# Patient Record
Sex: Female | Born: 1935 | Race: White | Hispanic: No | Smoking: Never smoker
Health system: Southern US, Community
[De-identification: ages and names within clinical notes are randomized; demographics above are authoritative.]

## PROBLEM LIST (undated history)

## (undated) ENCOUNTER — Emergency Department (HOSPITAL_COMMUNITY): Payer: No Typology Code available for payment source | Source: Home / Self Care

## (undated) DIAGNOSIS — Z853 Personal history of malignant neoplasm of breast: Secondary | ICD-10-CM

## (undated) DIAGNOSIS — E785 Hyperlipidemia, unspecified: Secondary | ICD-10-CM

## (undated) DIAGNOSIS — I10 Essential (primary) hypertension: Secondary | ICD-10-CM

## (undated) DIAGNOSIS — E039 Hypothyroidism, unspecified: Secondary | ICD-10-CM

## (undated) DIAGNOSIS — I2109 ST elevation (STEMI) myocardial infarction involving other coronary artery of anterior wall: Secondary | ICD-10-CM

## (undated) DIAGNOSIS — Z8739 Personal history of other diseases of the musculoskeletal system and connective tissue: Secondary | ICD-10-CM

## (undated) DIAGNOSIS — I2510 Atherosclerotic heart disease of native coronary artery without angina pectoris: Secondary | ICD-10-CM

## (undated) DIAGNOSIS — M1990 Unspecified osteoarthritis, unspecified site: Secondary | ICD-10-CM

## (undated) DIAGNOSIS — I639 Cerebral infarction, unspecified: Secondary | ICD-10-CM

## (undated) HISTORY — DX: Hyperlipidemia, unspecified: E78.5

## (undated) HISTORY — PX: BACK SURGERY: SHX140

## (undated) HISTORY — PX: CHOLECYSTECTOMY: SHX55

## (undated) HISTORY — DX: Personal history of malignant neoplasm of breast: Z85.3

## (undated) HISTORY — DX: Cerebral infarction, unspecified: I63.9

## (undated) HISTORY — PX: OTHER SURGICAL HISTORY: SHX169

## (undated) HISTORY — DX: Hypothyroidism, unspecified: E03.9

## (undated) HISTORY — PX: CATARACT EXTRACTION: SUR2

## (undated) HISTORY — DX: ST elevation (STEMI) myocardial infarction involving other coronary artery of anterior wall: I21.09

## (undated) HISTORY — DX: Essential (primary) hypertension: I10

## (undated) HISTORY — DX: Personal history of other diseases of the musculoskeletal system and connective tissue: Z87.39

---

## 1997-11-21 ENCOUNTER — Ambulatory Visit (HOSPITAL_COMMUNITY): Admission: RE | Admit: 1997-11-21 | Discharge: 1997-11-21 | Payer: Self-pay | Admitting: Cardiothoracic Surgery

## 1997-12-04 ENCOUNTER — Ambulatory Visit (HOSPITAL_COMMUNITY): Admission: RE | Admit: 1997-12-04 | Discharge: 1997-12-04 | Payer: Self-pay | Admitting: Cardiothoracic Surgery

## 1997-12-11 ENCOUNTER — Inpatient Hospital Stay (HOSPITAL_COMMUNITY): Admission: RE | Admit: 1997-12-11 | Discharge: 1997-12-12 | Payer: Self-pay | Admitting: Cardiothoracic Surgery

## 1998-07-02 ENCOUNTER — Encounter: Payer: Self-pay | Admitting: Internal Medicine

## 1998-07-02 ENCOUNTER — Ambulatory Visit (HOSPITAL_COMMUNITY): Admission: RE | Admit: 1998-07-02 | Discharge: 1998-07-02 | Payer: Self-pay | Admitting: Internal Medicine

## 1999-07-07 ENCOUNTER — Encounter: Payer: Self-pay | Admitting: General Surgery

## 1999-07-07 ENCOUNTER — Ambulatory Visit (HOSPITAL_COMMUNITY): Admission: RE | Admit: 1999-07-07 | Discharge: 1999-07-07 | Payer: Self-pay | Admitting: General Surgery

## 2000-08-11 ENCOUNTER — Ambulatory Visit (HOSPITAL_COMMUNITY): Admission: RE | Admit: 2000-08-11 | Discharge: 2000-08-11 | Payer: Self-pay | Admitting: Internal Medicine

## 2000-08-11 ENCOUNTER — Encounter: Payer: Self-pay | Admitting: Internal Medicine

## 2001-08-15 ENCOUNTER — Encounter: Payer: Self-pay | Admitting: Internal Medicine

## 2001-08-15 ENCOUNTER — Ambulatory Visit (HOSPITAL_COMMUNITY): Admission: RE | Admit: 2001-08-15 | Discharge: 2001-08-15 | Payer: Self-pay | Admitting: Internal Medicine

## 2002-08-15 ENCOUNTER — Encounter: Payer: Self-pay | Admitting: Internal Medicine

## 2002-08-15 ENCOUNTER — Ambulatory Visit (HOSPITAL_COMMUNITY): Admission: RE | Admit: 2002-08-15 | Discharge: 2002-08-15 | Payer: Self-pay | Admitting: Obstetrics and Gynecology

## 2002-08-22 ENCOUNTER — Encounter: Payer: Self-pay | Admitting: Emergency Medicine

## 2002-08-22 ENCOUNTER — Emergency Department (HOSPITAL_COMMUNITY): Admission: EM | Admit: 2002-08-22 | Discharge: 2002-08-22 | Payer: Self-pay | Admitting: Emergency Medicine

## 2002-08-22 ENCOUNTER — Inpatient Hospital Stay (HOSPITAL_COMMUNITY): Admission: RE | Admit: 2002-08-22 | Discharge: 2002-08-27 | Payer: Self-pay | Admitting: Cardiology

## 2003-05-02 ENCOUNTER — Emergency Department (HOSPITAL_COMMUNITY): Admission: EM | Admit: 2003-05-02 | Discharge: 2003-05-02 | Payer: Self-pay | Admitting: Emergency Medicine

## 2003-08-20 ENCOUNTER — Ambulatory Visit (HOSPITAL_COMMUNITY): Admission: RE | Admit: 2003-08-20 | Discharge: 2003-08-20 | Payer: Self-pay | Admitting: Internal Medicine

## 2004-09-08 ENCOUNTER — Ambulatory Visit (HOSPITAL_COMMUNITY): Admission: RE | Admit: 2004-09-08 | Discharge: 2004-09-08 | Payer: Self-pay | Admitting: Internal Medicine

## 2004-09-09 ENCOUNTER — Ambulatory Visit: Payer: Self-pay | Admitting: Internal Medicine

## 2005-10-20 ENCOUNTER — Ambulatory Visit (HOSPITAL_COMMUNITY): Admission: RE | Admit: 2005-10-20 | Discharge: 2005-10-20 | Payer: Self-pay | Admitting: Internal Medicine

## 2005-10-26 ENCOUNTER — Ambulatory Visit: Payer: Self-pay | Admitting: Internal Medicine

## 2006-10-25 ENCOUNTER — Ambulatory Visit (HOSPITAL_COMMUNITY): Admission: RE | Admit: 2006-10-25 | Discharge: 2006-10-25 | Payer: Self-pay | Admitting: Internal Medicine

## 2007-10-26 ENCOUNTER — Ambulatory Visit (HOSPITAL_COMMUNITY): Admission: RE | Admit: 2007-10-26 | Discharge: 2007-10-26 | Payer: Self-pay | Admitting: Internal Medicine

## 2008-01-01 ENCOUNTER — Encounter: Payer: Self-pay | Admitting: *Deleted

## 2008-01-01 DIAGNOSIS — Z9089 Acquired absence of other organs: Secondary | ICD-10-CM

## 2008-01-01 DIAGNOSIS — Z853 Personal history of malignant neoplasm of breast: Secondary | ICD-10-CM

## 2008-01-01 DIAGNOSIS — Z9189 Other specified personal risk factors, not elsewhere classified: Secondary | ICD-10-CM | POA: Insufficient documentation

## 2008-01-01 DIAGNOSIS — Z8739 Personal history of other diseases of the musculoskeletal system and connective tissue: Secondary | ICD-10-CM

## 2008-01-01 DIAGNOSIS — I10 Essential (primary) hypertension: Secondary | ICD-10-CM | POA: Insufficient documentation

## 2008-01-01 DIAGNOSIS — Z9849 Cataract extraction status, unspecified eye: Secondary | ICD-10-CM

## 2008-01-01 DIAGNOSIS — Z8719 Personal history of other diseases of the digestive system: Secondary | ICD-10-CM

## 2008-01-01 DIAGNOSIS — E039 Hypothyroidism, unspecified: Secondary | ICD-10-CM

## 2008-01-01 DIAGNOSIS — R252 Cramp and spasm: Secondary | ICD-10-CM | POA: Insufficient documentation

## 2008-01-01 DIAGNOSIS — I2109 ST elevation (STEMI) myocardial infarction involving other coronary artery of anterior wall: Secondary | ICD-10-CM | POA: Insufficient documentation

## 2008-07-02 ENCOUNTER — Telehealth (INDEPENDENT_AMBULATORY_CARE_PROVIDER_SITE_OTHER): Payer: Self-pay | Admitting: *Deleted

## 2008-09-06 ENCOUNTER — Ambulatory Visit: Payer: Self-pay | Admitting: Internal Medicine

## 2008-09-06 LAB — CONVERTED CEMR LAB
ALT: 13 units/L (ref 0–35)
Albumin: 3.8 g/dL (ref 3.5–5.2)
BUN: 11 mg/dL (ref 6–23)
Basophils Absolute: 0 10*3/uL (ref 0.0–0.1)
Basophils Relative: 0.6 % (ref 0.0–3.0)
Eosinophils Absolute: 0.1 10*3/uL (ref 0.0–0.7)
Eosinophils Relative: 2.1 % (ref 0.0–5.0)
GFR calc non Af Amer: 125 mL/min
HDL: 41.8 mg/dL (ref >39.0)
LDL Cholesterol: 107 mg/dL — ABNORMAL HIGH (ref 0–99)
MCHC: 33.6 g/dL (ref 30.0–36.0)
MCV: 82.8 fL (ref 78.0–100.0)
Monocytes Absolute: 0.5 10*3/uL (ref 0.1–1.0)
Neutro Abs: 3.2 10*3/uL (ref 1.4–7.7)
Neutrophils Relative %: 56.9 % (ref 43.0–77.0)
Platelets: 235 10*3/uL (ref 150–400)
RDW: 13.6 % (ref 11.5–14.6)
Total Bilirubin: 0.9 mg/dL (ref 0.3–1.2)
Triglycerides: 82 mg/dL (ref 0–149)
WBC: 5.4 10*3/uL (ref 4.5–10.5)

## 2008-11-13 ENCOUNTER — Ambulatory Visit (HOSPITAL_COMMUNITY): Admission: RE | Admit: 2008-11-13 | Discharge: 2008-11-13 | Payer: Self-pay | Admitting: Orthopedic Surgery

## 2009-10-14 ENCOUNTER — Telehealth: Payer: Self-pay | Admitting: Internal Medicine

## 2009-10-29 ENCOUNTER — Encounter: Payer: Self-pay | Admitting: Internal Medicine

## 2009-12-16 ENCOUNTER — Ambulatory Visit (HOSPITAL_COMMUNITY): Admission: RE | Admit: 2009-12-16 | Discharge: 2009-12-16 | Payer: Self-pay | Admitting: Internal Medicine

## 2009-12-16 LAB — HM MAMMOGRAPHY: HM Mammogram: NEGATIVE

## 2010-04-14 ENCOUNTER — Encounter: Payer: Self-pay | Admitting: Internal Medicine

## 2010-04-14 ENCOUNTER — Ambulatory Visit: Payer: Self-pay | Admitting: Internal Medicine

## 2010-04-14 LAB — CONVERTED CEMR LAB
AST: 23 units/L (ref 0–37)
BUN: 11 mg/dL (ref 6–23)
Basophils Absolute: 0 10*3/uL (ref 0.0–0.1)
Basophils Relative: 0.7 % (ref 0.0–3.0)
Bilirubin, Direct: 0.1 mg/dL (ref 0.0–0.3)
CO2: 27 meq/L (ref 19–32)
Calcium: 9.3 mg/dL (ref 8.4–10.5)
Eosinophils Relative: 1.5 % (ref 0.0–5.0)
Glucose, Bld: 85 mg/dL (ref 70–99)
HDL: 44.2 mg/dL (ref >39.00)
Hemoglobin: 10.9 g/dL — ABNORMAL LOW (ref 12.0–15.0)
LDL Cholesterol: 99 mg/dL (ref 0–99)
MCV: 82.3 fL (ref 78.0–100.0)
Magnesium: 1.8 mg/dL (ref 1.5–2.5)
Monocytes Absolute: 0.4 10*3/uL (ref 0.1–1.0)
Neutrophils Relative %: 60.9 % (ref 43.0–77.0)
Potassium: 4.3 meq/L (ref 3.5–5.1)
TSH: 0.97 microintl units/mL (ref 0.35–5.50)
Total Bilirubin: 0.8 mg/dL (ref 0.3–1.2)
Total CHOL/HDL Ratio: 4
Triglycerides: 88 mg/dL (ref 0.0–149.0)

## 2010-04-20 ENCOUNTER — Encounter: Payer: Self-pay | Admitting: Internal Medicine

## 2010-09-15 NOTE — Letter (Signed)
Summary: [facility]  [facility]   Imported By: Sherian Rein 11/10/2009 12:15:38  _____________________________________________________________________  External Attachment:    Type:   Image     Comment:   External Document

## 2010-09-15 NOTE — Letter (Signed)
[facility] [address] [place], [place]  [address] Phone: [phone]      April 21, 2010   Leah Figueroa [address] [place], [place] [address]  RE:  LAB RESULTS  Dear  Leah Figueroa,  The following is an interpretation of your most recent lab tests.  Please take note of any instructions provided or changes to medications that have resulted from your lab work.  KIDNEY FUNCTION TESTS:  Good - no changes needed  LIVER FUNCTION TESTS:  Good - no changes needed  LIPID PANEL:  Good - no changes needed Triglyceride: 88.0   Cholesterol: 161   LDL: 99   HDL: 44.20   Chol/HDL%:  4  THYROID STUDIES:  Thyroid studies normal TSH: 0.97     DIABETIC STUDIES:  Excellent - no changes needed Blood Glucose: 85    CBC:  Good - no changes needed   Normal lab resuslts.  Please come see me if you have any questions about these lab results.   Sincerely Yours,    Jacques Navy MD Hassell Halim Note: All result statuses are Final unless otherwise noted.  Tests: (1) CMP (COMP)   Sodium                    141 mEq/L                   135-145   Potassium                 4.3 mEq/L                   3.5-5.1   Chloride                  105 mEq/L                   96-112   Carbon Dioxide            28 mEq/L                    19-32   Glucose                   99 mg/dL                    13-24   BUN                       8 mg/dL                     4-01   Creatinine                0.6 mg/dL                   0.2-7.2   Total Bilirubin           1.1 mg/dL                   5.3-6.6   Alkaline Phosphatase      88 U/L                      39-117   AST                  [H]  38 U/L  0-37   ALT                       31 U/L                      0-53   Total Protein             6.1 g/dL                    1.1-9.1   Albumin                   4.0 g/dL                    4.7-8.2   Calcium                   8.9 mg/dL                   9.5-62.1   GFR                        132.12 mL/min               >60  Tests: (2) Lipid Panel (LIPID)   Cholesterol               123 mg/dL                   3-086     ATP III Classification            Desirable:  < 200 mg/dL                    Borderline High:  200 - 239 mg/dL               High:  > = 240 mg/dL   Triglycerides             97.0 mg/dL                  5.7-846.9     Normal:  <150 mg/dL     Borderline High:  629 - 199 mg/dL   HDL                       52.84 mg/dL                 >13.24   VLDL Cholesterol          19.4 mg/dL                  4.0-10.2   LDL Cholesterol           41 mg/dL                    7-25  CHO/HDL Ratio:  CHD Risk                             2                    Men          Women     1/2 Average Risk     3.4          3.3     Average Risk          5.0  4.4     2X Average Risk          9.6          7.1     3X Average Risk          15.0          11.0                           Tests: (3) CBC Platelet w/Diff (CBCD)   White Cell Count          5.7 K/uL                    4.5-10.5   Red Cell Count       [L]  4.19 Mil/uL                 4.22-5.81   Hemoglobin                14.4 g/dL                   16.1-09.6   Hematocrit                42.3 %                      39.0-52.0   MCV                  [H]  101.0 fl                    78.0-100.0   MCHC                      33.9 g/dL                   04.5-40.9   RDW                       13.3 %                      11.5-14.6   Platelet Count            155.0 K/uL                  150.0-400.0   Neutrophil %              70.0 %                      43.0-77.0   Lymphocyte %              19.5 %                      12.0-46.0   Monocyte %                7.6 %                       3.0-12.0   Eosinophils%              2.4 %                       0.0-5.0   Basophils %               0.5 %  0.0-3.0   Neutrophill Absolute      4.0 K/uL                    1.4-7.7   Lymphocyte Absolute       1.1 K/uL                     0.7-4.0   Monocyte Absolute         0.4 K/uL                    0.1-1.0  Eosinophils, Absolute                             0.1 K/uL                    0.0-0.7   Basophils Absolute        0.0 K/uL                    0.0-0.1

## 2010-09-15 NOTE — Assessment & Plan Note (Signed)
Summary: YEARLY FU/ MEDICARE/ LABS LATER/RS'D FROM 8-29 BUMP/ NWS   Vital Signs:  Patient profile:   77 year old female Height:      63 inches Weight:      124 pounds BMI:     22.05 O2 Sat:      97 % on Room air Temp:     97.9 degrees F oral Pulse rate:   54 / minute BP sitting:   158 / 82  (left arm) Cuff size:   regular  Vitals Entered By: Bill Salinas CMA (April 14, 2010 9:11 AM)  O2 Flow:  Room air CC: pt here for medicare physical/ ab   Primary Care Provider:  Jacques Navy MD  CC:  pt here for medicare physical/ ab.  History of Present Illness: Patient presents for general medical follow-up.She reports that she will be sleepy during the day but she has sleep latency insomnia. She will awaken every 2 hours to micturate. She also has nocturnal leg cramps and restless legs at night. She reports that mustard and milk will help reduce the leg cramps. Lastly she c/o pruritis of the head and neck.  Interval - she did have a cataract extraction with IOL May 2011.   She lives alone, but her daughter is moving in. She handles all her ADLs -cooking, cleaning, laundry. She pays her own bills and balances her check book. 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 She drives. She has had no falls and has no particular fall risk.   Preventive Screening-Counseling & Management  Alcohol-Tobacco     Alcohol drinks/day: 0     Smoking Status: never  Caffeine-Diet-Exercise     Caffeine use/day: none     Does Patient Exercise: No   Hep-HIV-STD-Contraception     Dental Visit-last 6 months yes     Sun Exposure-Excessive: no  Safety-Violence-Falls     Seat Belt Use: yes     Firearms in the Home: no firearms in the home     Smoke Detectors: yes     Violence in the Home: no risk noted     Sexual Abuse: no     Fall Risk: low fall risk      Drug Use:  never.    Current Medications (verified): 1)  Synthroid 25 Mcg  Tabs (Levothyroxine Sodium) .... Take Once Daily 2)  Toprol Xl  100 Mg  Tb24 (Metoprolol Succinate) .... Take Once Daily 3)  Lovastatin 20 Mg  Tabs (Lovastatin) .... Take Once Daily 4)  Aspirin 81 Mg  Tabs (Aspirin) .... Take Once Daily  Allergies (verified): No Known Drug Allergies  Past History:  Past Surgical History: * Hx of RIGHT THYROID LOBECTOMY * DSK SURGERY CATARACT EXTRACTION, HX OF (ICD-V45.61)-left 2006, right 2011 * INTRATHORACIC GOITER RESECTION CHOLECYSTECTOMY, LAPAROSCOPIC, HX OF (ICD-V45.79) * LUMPECTOMY FOR BREAST CANCER-left '80s  Family History: Reviewed history from 09/06/2008 and no changes required. 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 I-ADLs, manages books, pays bills. End-of-Life Care: no heroic measures; No CPR, no mechanical ventilationCaffeine use/day:  none Does Patient Exercise:  No  Dental Care w/in 6 mos.:  yes Sun Exposure-Excessive:  no Seat Belt Use:  yes Fall Risk:  low fall risk Drug Use:  never  Review of Systems       The patient complains of vision loss.  The patient denies anorexia,  fever, weight loss, weight gain, decreased hearing, chest pain, syncope, dyspnea on exertion, headaches, hemoptysis, abdominal pain, severe indigestion/heartburn, incontinence, muscle weakness, difficulty walking, depression, abnormal bleeding, angioedema, and breast masses.         vision loss right eye. She does have presbyopia and did have myopia corrected.   Physical Exam  General:  WNWD elderly white female in no distress Head:  normocephalic and atraumatic.   Eyes:  pu;pils look normal s/p cataract extraction and IOL left. She has decreased vision right eye. C&S clear Ears:  hearing aids in place Nose:  no external deformity.   Mouth:  upper denture,m lower partial. No oral lesions. Posterior pharynx clear Neck:  supple and full ROM.  Well healed anterior  surgical scar. No palpable enlargement of the thyroid Chest Wall:  No deformities, masses, or tenderness noted. Breasts:  loss of tissue right and left. Large cystic structure 0900-1100 position left breast. No nipple discharge, skin normal. Right breast normal. Lungs:  normal respiratory effort, normal breath sounds, no crackles, and no wheezes.   Heart:  normal rate, regular rhythm, no murmur, and no JVD.   Abdomen:  soft, non-tender, normal bowel sounds, no distention, and no hepatomegaly.   Msk:  changes at PIP and DIP joints both hands, mild interosseous wasting both hands.  Pulses:  2+ radial and DP pulses Extremities:  No clubbing, cyanosis, edema, or deformity noted with normal full range of motion of all joints.   Neurologic:  alert & oriented X3, cranial nerves II-XII intact, strength normal in all extremities, sensation intact to light touch, gait normal, and DTRs symmetrical and normal.   Skin:  pin-point tatoos on left breast and chest wall. No other lesions noted.  Cervical Nodes:  no anterior cervical adenopathy and no posterior cervical adenopathy.   Axillary Nodes:  left axilla with radiation changes. Right axilla normal Psych:  Oriented X3, memory intact for recent and remote, normally interactive, and good eye contact.     Impression & Recommendations:  Problem # 1:  ARTHRITIS, HX OF (ICD-V13.4) Generally stable with no significant limitations in her activities.  Plan- continue with as needed NSAIDs  Orders: TLB-CBC Platelet - w/Differential (85025-CBCD)  Problem # 2:  CATARACT EXTRACTION, HX OF (ICD-V45.61) Doing well after cataract surgery. Follow-up per opthalmology  Problem # 3:  HYPERTENSION (ICD-401.9)  Her updated medication list for this problem includes:    Toprol Xl 100 Mg Tb24 (Metoprolol succinate) .Marland Kitchen... Take once daily  BP today: 158/82 Prior BP: 148/64 (09/06/2008)  Suboptimal control. In addition she may have some sense of somnolence from  beta-blocker dose.  Plan - will discuss change in medication at next office visit. Will consider CCB  Problem # 4:  HYPOTHYROIDISM (ICD-244.9) Due for lab follow-up with recommendations to follow.  Her updated medication list for this problem includes:    Synthroid 25 Mcg Tabs (Levothyroxine sodium) .Marland Kitchen... Take once daily  Orders: TLB-T4 (Thyrox), Free [phone]) TLB-TSH (Thyroid Stimulating Hormone) (84443-TSH)  Addendum- lab reveals good control and normal hormone levels  Plan - continue present medications  Problem # 5:  HYPERLIPIDEMIA (ICD-272.4) Due for lab with recommendations to follow  Her updated medication list for this problem includes:    Lovastatin 20 Mg Tabs (Lovastatin) .Marland Kitchen... Take once daily  Orders: TLB-Lipid Panel (80061-LIPID) TLB-Hepatic/Liver Function Pnl (80076-HEPATIC)  Addendum- good control with LDL = 99, at goal  Plan - continue present medications.  Problem # 6:  Hx of LEG CRAMPS (ICD-729.82) Patient gets  relief with mstard! Will check Mg., K   Orders: TLB-BMP (Basic Metabolic Panel-BMET) (80048-METABOL) TLB-Magnesium (Mg) (83735-MG)  Addendum - Mg normal at 1.4, Potassium normal.  Plan - continue mustard. Can try tonic water.  Problem # 7:  Preventive Health Care (ICD-V70.0) Unremarkable history. Normal physical exam. Lab results are in normal range. She had a recent mammogram - negatve. She is current with colorectal cancer screening and has aged out of further exams.  Patient is very active and shows no signs of depression. She is 100% independent in ADLs, no fall or injury risk, cognitively intact. End of life wishes discussed.  In summary - a delightful woman who is in good condition for the condition she is in. She is given flu shot today. She will return as needed or 1 year.   Complete Medication List: 1)  Synthroid 25 Mcg Tabs (Levothyroxine sodium) .... Take once daily 2)  Toprol Xl 100 Mg Tb24 (Metoprolol succinate) .... Take  once daily 3)  Lovastatin 20 Mg Tabs (Lovastatin) .... Take once daily 4)  Aspirin 81 Mg Tabs (Aspirin) .... Take once daily 5)  Axona Pack (Dietary management product) .... 40mg  packet daily  Other Orders: Flu Vaccine 76yrs + (16109) Administration Flu vaccine - MCR (G0008) [facility] -Subsequent Annual Wellness Visit [phone])      Flu Vaccine Consent Questions     Do you have a history of severe allergic reactions to this vaccine? no    Any prior history of allergic reactions to egg and/or gelatin? no    Do you have a sensitivity to the preservative Thimersol? no    Do you have a past history of Guillan-Barre Syndrome? no    Do you currently have an acute febrile illness? no    Have you ever had a severe reaction to latex? no    Vaccine information given and explained to patient? yes    Are you currently pregnant? no    Lot Number:[id number]   Exp Date:02/13/2011   Site Given  RIGHT Deltoid IMflu ......... Lamar Sprinkles, CMA  April 14, 2010 10:29 AM

## 2010-09-15 NOTE — Progress Notes (Signed)
  Phone Note Refill Request   Refills Requested: Medication #1:  SYNTHROID 25 MCG  TABS Take once daily  Medication #2:  TOPROL XL 100 MG  TB24 Take once daily  Medication #3:  LOVASTATIN 20 MG  TABS Take once daily Initial call taken by: Ami Bullins CMA,  October 14, 2009 9:13 AM    Prescriptions: LOVASTATIN 20 MG  TABS (LOVASTATIN) Take once daily  #90 x 3   Entered by:   Ami Bullins CMA   Authorized by:   Jacques Navy MD   Signed by:   Bill Salinas CMA on 10/14/2009   Method used:   Faxed to ...       [facility] ([facility]order)             , [place]         Ph:        Fax: [phone]   RxID:   [id number] TOPROL XL 100 MG  TB24 (METOPROLOL SUCCINATE) Take once daily  #90 x 3   Entered by:   Ami Bullins CMA   Authorized by:   Jacques Navy MD   Signed by:   Bill Salinas CMA on 10/14/2009   Method used:   Faxed to ...       [facility])             , [place]         Ph:        Fax: [phone]   RxID:   [id number] SYNTHROID 25 MCG  TABS (LEVOTHYROXINE SODIUM) Take once daily  #90 x 3   Entered by:   Ami Bullins CMA   Authorized by:   Jacques Navy MD   Signed by:   Bill Salinas CMA on 10/14/2009   Method used:   Faxed to ...       [facility]             , [place]         Ph:        Fax: [phone]   RxID:   [id number]

## 2010-12-03 ENCOUNTER — Telehealth: Payer: Self-pay | Admitting: *Deleted

## 2010-12-03 MED ORDER — LEVOTHYROXINE SODIUM 25 MCG PO TABS: 25.0000 ug | ORAL_TABLET | Freq: Every day | ORAL | Status: DC

## 2010-12-03 MED ORDER — LOVASTATIN 20 MG PO TABS: 20.0000 mg | ORAL_TABLET | Freq: Every day | ORAL | Status: DC

## 2010-12-03 NOTE — Telephone Encounter (Signed)
Refill

## 2010-12-15 ENCOUNTER — Other Ambulatory Visit: Payer: Self-pay | Admitting: Internal Medicine

## 2010-12-15 DIAGNOSIS — Z1231 Encounter for screening mammogram for malignant neoplasm of breast: Secondary | ICD-10-CM

## 2010-12-24 ENCOUNTER — Ambulatory Visit (HOSPITAL_COMMUNITY)
Admission: RE | Admit: 2010-12-24 | Discharge: 2010-12-24 | Disposition: A | Discharge status: Home / Self Care | Payer: Medicare Other | Source: Ambulatory Visit | Attending: Internal Medicine | Admitting: Internal Medicine

## 2010-12-24 DIAGNOSIS — Z1231 Encounter for screening mammogram for malignant neoplasm of breast: Secondary | ICD-10-CM

## 2010-12-24 DIAGNOSIS — Z853 Personal history of malignant neoplasm of breast: Secondary | ICD-10-CM

## 2010-12-29 ENCOUNTER — Other Ambulatory Visit: Payer: Self-pay | Admitting: *Deleted

## 2010-12-29 MED ORDER — LOVASTATIN 20 MG PO TABS: 20.0000 mg | ORAL_TABLET | Freq: Every day | ORAL | Status: DC

## 2010-12-29 MED ORDER — LEVOTHYROXINE SODIUM 25 MCG PO TABS: 25.0000 ug | ORAL_TABLET | Freq: Every day | ORAL | Status: DC

## 2010-12-29 MED ORDER — METOPROLOL SUCCINATE ER 100 MG PO TB24: 100.0000 mg | ORAL_TABLET | Freq: Every day | ORAL | Status: DC

## 2010-12-29 NOTE — Progress Notes (Signed)
Needs Rf's to go to mail order, Done, pt aware

## 2010-12-31 ENCOUNTER — Encounter: Payer: Self-pay | Admitting: Internal Medicine

## 2011-01-01 NOTE — Cardiovascular Report (Signed)
NAME:  Leah Figueroa, Leah Figueroa                            ACCOUNT NO.:  [id number]   MEDICAL RECORD NO.:  [id number]                   PATIENT TYPE:  OIB   LOCATION:  6526                                 FACILITY:  [facility]   PHYSICIAN:  Charlies Constable, M.D. [facility]              DATE OF BIRTH:  Jan 07, 1923   DATE OF PROCEDURE:  08/22/2002  DATE OF DISCHARGE:                              CARDIAC CATHETERIZATION   PROCEDURE PERFORMED:  Cardiac catheterization.   CLINICAL HISTORY:  The patient is 77 years old and has no prior history of  known heart disease.  She came to the emergency room this morning with  prolonged chest pain and a normal ECG, but her enzymes returned positive for  a non-ST elevation myocardial infarction.  She was seen by Delton See  and Simona Huh, and scheduled urgently for catheterization.  Her past  history is significant for elevated cholesterol and positive family history  for coronary disease.   DESCRIPTION OF PROCEDURE:  The procedure was performed via the right femoral  artery using an arterial sheath and 6 French preformed coronary catheters.  A front wall arterial puncture was performed and Omnipaque contrast was  used. A distal aortogram was performed to rule out abdominal aortic  aneurysm.  The patient tolerated the procedure well and left the laboratory  in satisfactory condition.   RESULTS:  The aortic pressure was 148/74 with a mean of 107.  Left  ventricular pressure is 148/13.   The left main coronary artery:  The left main coronary artery was free of  significant disease.   Left anterior descending:  The left anterior descending artery gave rise to  two diagonal branches and four septal perforators and then was completely  occluded distally.  A distal portion of the vessel filled via septal to  septal collaterals.  There was also 40% narrowing in the mid vessel and  calcification in the proximal and mid vessel.   Circumflex artery:  The circumflex  artery gave rise to a large marginal  branch and a small AV branch.  There was 30% narrowing in one of the sub-  branches to the marginal branch.   Right coronary artery:  The right coronary is a large dominant vessel that  gave rise to a conus branch, right ventricular branch, posterior descending  branch, and three posterolateral branches.  There was irregularity in the  mid vessel.   LEFT VENTRICULOGRAPHY:  The left ventriculogram was performed in the RAO  projection  showed akinesis of the apex.  The anterolateral wall moved well  and the inferior wall moved well.  The estimated ejection fraction was 50%.   DISTAL AORTOGRAM:  A distal aortogram was performed which showed patent  renal arteries and no significant aortoiliac obstruction.   CONCLUSIONS:  Coronary artery disease, status post recent non ST segment  elevation, myocardial infarction with 40% narrowing in the mid  left anterior  descending, and total occlusion of the distal left anterior descending, 30%  narrowing in the marginal branch of the circumflex artery and irregularities  in the right coronary artery with a small area of apical wall akinesis.   RECOMMENDATIONS:  In view of these findings, I suspect the culprit lesion is  the distal LAD, although it has some features of chronic occlusion.  The  lesion does not appear very favorable for intervention and the distal vessel  appears fairly small, so we will plan medical therapy. We will plan to treat  her with Plavix, aspirin and beta blockers.                                                     Charlies Constable, M.D. [facility]    BB/MEDQ  D:  08/22/2002  T:  08/23/2002  Job:  [id number]   cc:   Rosalyn Gess. Norins, M.D. [facility]   Jonelle Sidle, M.D. [facility]  [address]  [place]  [place] [address]  Fax: 1   Cardiopulmonary Laboratory

## 2011-01-01 NOTE — Discharge Summary (Signed)
NAME:  Leah Figueroa, Leah Figueroa NO.:  [id number]   MEDICAL RECORD NO.:  [id number]                    PATIENT TYPE:   LOCATION:                                       FACILITY:   PHYSICIAN:  Charlies Constable, M.D. [facility]              DATE OF BIRTH:  1922/09/23   DATE OF ADMISSION:  08/22/2002  DATE OF DISCHARGE:  08/27/2002                           DISCHARGE SUMMARY - REFERRING   BRIEF HISTORY:  This is a pleasant 77 year-old female with a history of  hypothyroidism and hyperlipidemia who presented to the emergency room for  evaluation of chest pain which occurred on the morning of admission while  she was making coffee.  She was seen by Dr. Milinda Cave and admitted for further  evaluation.  The patient subsequently ruled in for a MI.   PAST MEDICAL HISTORY:  1. Dyslipidemia.  2. Hypothyroidism.  3. Status post cholecystectomy.  4. Remote thyroid surgery.  5. Status post back surgery.  6. History of breast carcinoma with previous lumpectomy.   ALLERGIES:  No known drug allergies.   MEDICATIONS PRIOR TO ADMISSION:  1. Synthroid 0.025 mg daily.  2. Lescol XL 80 mg at bedtime.   SOCIAL HISTORY:  The patient is married and she lives with her husband in  [place].  She has one daughter.  She does not use alcohol or tobacco.   FAMILY HISTORY:  The patient's brother died in his fifties.  He had a  history of coronary artery bypass grafting surgery.   HOSPITAL COURSE:  As noted, this patient was admitted to [facility]  for evaluation of chest pain.  She subsequently ruled in for a non-ST  elevation MI with positive cardiac enzymes.  She was treated with IV heparin  and Integrelin.   On August 22, 2002 the patient underwent cardiac catheterization performed  by Dr. Charlies Constable.  She was found to have a 40% mid LAD, a total occlusion  of the distal LAD, the circumflex had a 30% obtuse marginal lesion, the RCA  had some irregularities, the left ventricle  showed apical akinesis and the  ejection fraction was 50%.   Arrangements were being made for discharge the following day; however, the  patient had episodes of paroxysmal atrial fibrillation.  She was also noted  to be hypokalemic.  Her potassium was supplemented, her medications were  adjusted and arrangements were eventually made to discharge her on August 27, 2002.  The patient also had at least one episode of four beats of non-  sustained ventricular tachycardia on her stay.   LABORATORY DATA:  Laboratory data on the day of discharge:  BUN 14,  creatinine 0.8, potassium 4.3, glucose 95.   An EKG on the day of discharge showed normal sinus rhythm, rate 61 beats per  minute, no definite ischemia but poor R wave progression.   A CBC on  January 8 revealed a hemoglobin of 10.3 and a hematocrit of 30.0.  On January 9 her hemoglobin was 11.9, hematocrit was 34.4, WBC 9,300 and  platelets were 252,000.  Cardiac enzymes peaked on August 23, 2002 with a CK  of 566, an MB of 81.3 and an index of 14.4.  A lipid profile revealed a  cholesterol of 173, triglycerides 113, HDL 40, LDL 110.   Chest x-ray showed no significant change from a previous film that showed  some scarring at the base and a right paratracheal density that was chronic.  It is noted the patient has a history of thyroid surgery.   DISCHARGE MEDICATIONS:  1. Toprol XL 50 mg one and one half tablets daily.  2. Enteric coated aspirin 325 mg daily.  3. Plavix 75 mg daily for one year.  4. Nitroglycerin p.r.n. for chest pain.  5. She was started on Mevacor 20 mg each evening.  As noted, she had     previously been on Lescol XL 80 mg at bedtime.  This was inadvertently     not continued during her hospital stay and she was placed on the Mevacor     prior to discharge.  She could probably resume her Lescol.  6. Her Synthroid was also not continued during her hospital stay.  The     patient could resume this following  discharge.  7. Tylenol as needed for pain.   ACTIVITY:  The patient was told to avoid any strenuous activity or driving  for at least two days.  She is not to lift more than ten pounds for one  week.   DIET:  She is to be on a low salt, low fat diet.   SPECIAL INSTRUCTIONS:  She was told to call the office if she had any  increased pain, swelling or bleeding from the groin.   FOLLOW UP:  1. She is to follow-up with Dr. Charlies Constable on January 22 at 9:45 a.m.  2. Follow-up with Dr. Debby Bud as needed or as scheduled.   PROBLEM LIST AT THE TIME OF DISCHARGE:  1. Non-Q myocardial infarction.  2. Cardiac catheterization; please see results as noted above.  No     intervention was performed.  3. History of dyslipidemia.  4. History of hypothyroidism.  5. Mild hypotension during her stay.  6. Paroxysmal atrial fibrillation with no plans for Coumadin therapy at this     time.  7. Non-sustained ventricular tachycardia.  8. Mild hypokalemia.  9. Mild anemia.  10.      Ejection fraction 50% at the time of catheterization.    ADDENDUM:  The patient will be seen in the office on January 22.  It should  be confirmed that the patient has restarted her Synthroid.  Also, she could  be changed from the Mevacor to the Lescol which she had previously taken  prior to admission.     Delton See, P.A. [facility]                  Charlies Constable, M.D. [facility]    DR/MEDQ  D:  08/27/2002  T:  08/27/2002  Job:  [id number]   cc:   Rosalyn Gess. Norins, M.D. [facility]

## 2011-01-01 NOTE — H&P (Signed)
NAME:  Leah Figueroa, Leah Figueroa                            ACCOUNT NO.:  [id number]   MEDICAL RECORD NO.:  [id number]                   PATIENT TYPE:  EMS   LOCATION:  ED                                   FACILITY:  [facility]   PHYSICIAN:  Jonelle Sidle, M.D. [facility]        DATE OF BIRTH:  [birth year]   DATE OF ADMISSION:  08/22/2002  DATE OF DISCHARGE:                                HISTORY & PHYSICAL   PRIMARY CARE PHYSICIAN:  Rosalyn Gess. Norins, M.D. [facility]   CHIEF COMPLAINT:  Chest discomfort.   HISTORY OF PRESENT ILLNESS:  The patient is a very pleasant 77 year old  woman with a history of hypothyroidism and dyslipidemia who presents  following the sudden onset of chest tightness this morning around 8 a.m.  when she was making coffee.  This persisted and was associated with  diaphoresis and nausea throughout the morning until 11 a.m. when she  ultimately presented to the emergency department and received some  sublingual nitroglycerin. She had an episode of emesis and then pain relief.   At present she has some minor residual discomfort that continues to improve.  She has had no previous episodes of chest discomfort and has no known  history of coronary artery disease or myocardial infarction. 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 She denies any exertional chest discomfort, orthopnea, PND,  palpitations or syncope. She has felt tired moreso than normal over the  last few months.   ALLERGIES:  No known drug allergies or contrast dye allergy.   CURRENT MEDICATIONS:  1. Synthroid 0.025 mg p.o. q.d.  2. Lescol XL 80 mg p.o. q.h.s.   PAST MEDICAL HISTORY:  1. Dyslipidemia.  2. Hypothyroidism.  3. Status post cholecystectomy.  4. Status post remote thyroid surgery.  5. Status post back surgery.  6. History of breast cancer, status post lumpectomy.   SOCIAL HISTORY:  The patient is married and lives with her husband in  [place].  She has one daughter. She has no tobacco or alcohol use history.   FAMILY HISTORY:  Contributory for premature coronary artery disease. The  patient's brother died in his 65s with a history of coronary artery bypass  grafting.   REVIEW OF SYSTEMS:  As  described in the history of present illness. The  patient has cataracts. She has some arthralgias and joint swelling at times.   PHYSICAL EXAMINATION:  VITAL SIGNS:  Blood pressure 146/57, heart rate 62  and regular.  GENERAL:  This is a well nourished elderly woman lying supine in no acute  distress.  HEENT:  Conjunctivae and lids are normal. Oropharynx is clear.  NECK:  Supple without elevated jugular venous pressure or carotid bruits. No  thyromegaly or thyroid tenderness is noted.  LUNGS:  Clear to auscultation bilaterally with normal respiratory effort  at  rest.  CARDIAC:  Examination  reveals a regular rate and rhythm without S3, gallop  or murmur. There is no pericardial rub.  ABDOMEN:  Soft without hepatomegaly or bruits.  EXTREMITIES:  Exhibit no cyanosis, clubbing or edema. Peripheral pulses 2+.  SKIN:  No ulcer changes are noted.  MUSCULOSKELETAL:  No kyphosis is noted.  NEUROPSYCHIATRIC:  The patient is alert and oriented x3. Affect is normal.   LABORATORY DATA:  Chest x-ray shows no significant change compared to  previous films with scarring at the base and a right paratracheal density  that is chronic but decreased in size from previous films. Electrocardiogram  shows sinus bradycardia at 59 beats per minute with nonspecific T-wave  changes. There is no old tracing for comparison.   CK 151, MB 6.6, troponin I 0.27. Hemoglobin 11.8, hematocrit 34.2, WBCs 8.4,  platelet 271. BUN 14, creatinine 0.6, potassium 4.0. INR 0.9.   IMPRESSION:  1. Recent onset chest pain syndrome, consistent with acute coronary     syndrome, non ST elevation myocardial infarction with a troponin I of     0.27, but a nonspecific total  electrocardiogram. The patient's symptoms     have improved following nitroglycerin and heparin.  2. History of dyslipidemia, on statin therapy.  3. Family history of premature coronary artery disease.   PLAN:  1. Will admit to [facility] and anticipate diagnostic coronary     angiography this afternoon or early tomorrow morning.  2. Will start Integrilin in addition to heparin, aspirin and nitroglycerin     drip. No beta blocker  for the time being, as the patient's heart rate is     in the low 60s. Will also not give Plavix as multivessel disease is a     potential possibility.  3. Check fasting lipid profile in the morning to assure the patient is at     goal.  4. Continue current Synthroid dose.                                               Jonelle Sidle, M.D. [facility]    SGM/MEDQ  D:  08/22/2002  T:  08/22/2002  Job:  [id number]

## 2011-01-05 ENCOUNTER — Ambulatory Visit (INDEPENDENT_AMBULATORY_CARE_PROVIDER_SITE_OTHER): Payer: Medicare Other | Admitting: Internal Medicine

## 2011-01-05 DIAGNOSIS — I10 Essential (primary) hypertension: Secondary | ICD-10-CM

## 2011-01-05 DIAGNOSIS — E039 Hypothyroidism, unspecified: Secondary | ICD-10-CM

## 2011-01-05 DIAGNOSIS — E785 Hyperlipidemia, unspecified: Secondary | ICD-10-CM

## 2011-01-05 NOTE — Patient Instructions (Signed)
Cholesterol medication - since you have had a heart attack (several years ago) it is a good idea to continue to take the cholesterol medication. When yu have been back on the medication for at least a month we will check lab work.  Blood pressure - too high today. Please fill the short-term prescription for your metoprolol ER 100mg  once a day. Get your blood pressure checked once back on medication to be sure it is controlled.    Acid reflux with swallow difficulty - may be due to acid damage. If the problem gets worse you will need to see a GI doctor for an examination and possible dilation of the esophagus. It is OK to take tums every evening or you may want to try over the counter Zantac or pepcid instead.   Thyroid - we will check your thyroid function in a month at the same time we check you cholesterol.

## 2011-01-06 ENCOUNTER — Encounter: Payer: Self-pay | Admitting: Internal Medicine

## 2011-01-06 NOTE — Progress Notes (Signed)
Subjective:    Patient ID: Leah Figueroa, female    DOB: 03-13-1923, 77 y.o.   MRN: [id number]  HPI Leah Figueroa, a very bright and alert 77 y/o presents to straighten out her prescriptions: we had failed to file her metformin Rx. She has been feeling well with no new complaints. She denies any specifice pain or discomfort.  ADLs: she is independent in all ADLs except driving. She manages her affairs, keeping up with meds, appointments and [place] care.  Home safety - has smoke detectors. She has not had any falls. She does have risks including visual impairment, arthritis and she is aware of her risks and takes precautions.  She has a healthy diet. She is very active although not with a formal exercise program.  Past Medical History  Diagnosis Date  . Other specified personal history presenting hazards to health   . Personal history of arthritis   . Unspecified personal history presenting hazards to health   . Cramp of limb   . Acute myocardial infarction of other anterior wall, episode of care unspecified   . Personal history of malignant neoplasm of breast   . Unspecified essential hypertension   . Personal history of other diseases of digestive system   . Unspecified hypothyroidism   . Other and unspecified hyperlipidemia    Past Surgical History  Procedure Date  . Right thyroid lobectomy   . Cataract extraction     left-2006, right 2011  . Intrathoracic goiter resection   . Cholecystectomy   . Lumpectomy for breast cancer 80's    left   Family History  Problem Relation Age of Onset  . Cancer Mother     colon  . Coronary artery disease Brother    History   Social History  . Marital Status: Widowed    Spouse Name: N/A    Number of Children: N/A  . Years of Education: N/A   Occupational History  . Not on file.   Social History Main Topics  . Smoking status: Not on file  . Smokeless tobacco: Not on file  . Alcohol Use:   . Drug Use:   . Sexually Active:    Other  Topics Concern  . Not on file   Social History Narrative   HSG. Married '48- 57 yrs/widoewed. 1 daughter - '59; 1 grandchild. 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  Lives alone. I-ADLs, manages books, pays bills.. End-of-Life Care: no heroic measures; No CPR, no mechanical ventilation.  Caffeine use/day:  None. Does Patient Exercise:  No        Review of Systems Review of Systems  Constitutional:  Negative for fever, chills, activity change and unexpected weight change.  HENT:  Negative for hearing loss, ear pain, congestion, neck stiffness and postnasal drip.   Eyes: Negative for pain, discharge and visual disturbance.  Respiratory: Negative for chest tightness and wheezing.   Cardiovascular: Negative for chest pain and palpitations.       [No decreased exercise tolerance Gastrointestinal: [No change in bowel habit. No bloating or gas. No reflux or indigestion Genitourinary: Negative for urgency, frequency, flank pain and difficulty urinating.  Musculoskeletal: Negative for myalgias, back pain, arthralgias and gait problem.  Neurological: Negative for dizziness, tremors, weakness and headaches.  Hematological: Negative for adenopathy.  Psychiatric/Behavioral: Negative for behavioral problems and dysphoric mood.       Objective:   Physical Exam Vitals reviewed Gen'l - WNWD, birhgt and alert WW in no distress HEENT - grossly normal Lungs -  CTAP Cor- RRR, Extremity - no deformity Neuro - A&O x3, CN II-XII normal. MS - normal and equal, cerebellar - normal gait and movement, no tremor.          Assessment & Plan:  1. Hypertension - very poor control. She admits to having been out of metoprolol for more than a week. Her BP is usually better than today's reading;  Plan - sent in 90 day Rx           Provided a 14 day Rx for metoprolol.  2. Lipids - she last had lab in August '11 and was doing well. She has a history of coronary artery disease and sees value in continuing  treatment.  Plan - continue "statin"           Labs in August '12

## 2011-02-09 ENCOUNTER — Other Ambulatory Visit (INDEPENDENT_AMBULATORY_CARE_PROVIDER_SITE_OTHER): Payer: Medicare Other

## 2011-02-09 DIAGNOSIS — E785 Hyperlipidemia, unspecified: Secondary | ICD-10-CM

## 2011-02-09 DIAGNOSIS — I10 Essential (primary) hypertension: Secondary | ICD-10-CM

## 2011-02-09 DIAGNOSIS — E039 Hypothyroidism, unspecified: Secondary | ICD-10-CM

## 2011-02-09 LAB — LIPID PANEL
Cholesterol: 140 mg/dL (ref 0–200)
LDL Cholesterol: 83 mg/dL (ref 0–99)
Total CHOL/HDL Ratio: 3
Triglycerides: 70 mg/dL (ref 0.0–149.0)
VLDL: 14 mg/dL (ref 0.0–40.0)

## 2011-02-09 LAB — HEPATIC FUNCTION PANEL
ALT: 11 U/L (ref 0–35)
AST: 20 U/L (ref 0–37)
Bilirubin, Direct: 0.1 mg/dL (ref 0.0–0.3)
Total Bilirubin: 0.4 mg/dL (ref 0.3–1.2)
Total Protein: 7.3 g/dL (ref 6.0–8.3)

## 2011-02-09 LAB — COMPREHENSIVE METABOLIC PANEL
AST: 20 U/L (ref 0–37)
Alkaline Phosphatase: 60 U/L (ref 39–117)
CO2: 27 mEq/L (ref 19–32)
Chloride: 109 mEq/L (ref 96–112)
Glucose, Bld: 81 mg/dL (ref 70–99)
Potassium: 4.5 mEq/L (ref 3.5–5.1)
Sodium: 143 mEq/L (ref 135–145)

## 2011-02-14 ENCOUNTER — Encounter: Payer: Self-pay | Admitting: Internal Medicine

## 2011-04-14 ENCOUNTER — Telehealth: Payer: Self-pay | Admitting: *Deleted

## 2011-04-14 NOTE — Telephone Encounter (Signed)
Patient requesting a call back, has med question - says generic rx is same price as brand and "we need to get this straightened out"

## 2011-04-16 ENCOUNTER — Encounter: Payer: PRIVATE HEALTH INSURANCE | Admitting: Internal Medicine

## 2011-04-16 MED ORDER — METOPROLOL TARTRATE 50 MG PO TABS: 50.0000 mg | ORAL_TABLET | Freq: Two times a day (BID) | ORAL | Status: DC

## 2011-04-16 NOTE — Telephone Encounter (Signed)
Rx to be mailed to patient today

## 2011-04-16 NOTE — Telephone Encounter (Signed)
Ok for lopressor 50 mg bid. #180, refill x 3 Thanks

## 2011-04-16 NOTE — Telephone Encounter (Signed)
Patient came into the office w/questions about med. Generic toprol price has increased and ins advised her to get "generic" form. OK to change to lopressor? Pt is aware that she would have to take med bid and she will need written RX mailed to her.

## 2011-09-08 DIAGNOSIS — H35729 Serous detachment of retinal pigment epithelium, unspecified eye: Secondary | ICD-10-CM | POA: Diagnosis not present

## 2011-09-08 DIAGNOSIS — H31019 Macula scars of posterior pole (postinflammatory) (post-traumatic), unspecified eye: Secondary | ICD-10-CM | POA: Diagnosis not present

## 2011-09-08 DIAGNOSIS — H04129 Dry eye syndrome of unspecified lacrimal gland: Secondary | ICD-10-CM | POA: Diagnosis not present

## 2011-09-08 DIAGNOSIS — H04209 Unspecified epiphora, unspecified lacrimal gland: Secondary | ICD-10-CM | POA: Diagnosis not present

## 2011-10-26 ENCOUNTER — Other Ambulatory Visit: Payer: Self-pay | Admitting: *Deleted

## 2011-10-26 MED ORDER — LEVOTHYROXINE SODIUM 25 MCG PO TABS: 25.0000 ug | ORAL_TABLET | Freq: Every day | ORAL | Status: DC

## 2012-02-07 ENCOUNTER — Other Ambulatory Visit: Payer: Self-pay

## 2012-02-07 MED ORDER — LEVOTHYROXINE SODIUM 25 MCG PO TABS: 25.0000 ug | ORAL_TABLET | Freq: Every day | ORAL | Status: DC

## 2012-02-07 MED ORDER — LOVASTATIN 20 MG PO TABS: 20.0000 mg | ORAL_TABLET | Freq: Every day | ORAL | Status: DC

## 2012-04-18 ENCOUNTER — Ambulatory Visit (INDEPENDENT_AMBULATORY_CARE_PROVIDER_SITE_OTHER): Payer: Medicare Other | Admitting: Internal Medicine

## 2012-04-18 ENCOUNTER — Other Ambulatory Visit (INDEPENDENT_AMBULATORY_CARE_PROVIDER_SITE_OTHER): Payer: Medicare Other

## 2012-04-18 ENCOUNTER — Encounter: Payer: Self-pay | Admitting: Internal Medicine

## 2012-04-18 ENCOUNTER — Telehealth: Payer: Self-pay

## 2012-04-18 VITALS — BP 130/60 | HR 65 | Temp 98.7°F | Resp 16 | Wt 113.0 lb

## 2012-04-18 DIAGNOSIS — I10 Essential (primary) hypertension: Secondary | ICD-10-CM | POA: Diagnosis not present

## 2012-04-18 DIAGNOSIS — R040 Epistaxis: Secondary | ICD-10-CM

## 2012-04-18 DIAGNOSIS — E785 Hyperlipidemia, unspecified: Secondary | ICD-10-CM

## 2012-04-18 DIAGNOSIS — E039 Hypothyroidism, unspecified: Secondary | ICD-10-CM

## 2012-04-18 DIAGNOSIS — Z0000 Encounter for general adult medical examination without abnormal findings: Secondary | ICD-10-CM

## 2012-04-18 LAB — HEPATIC FUNCTION PANEL
AST: 22 U/L (ref 0–37)
Alkaline Phosphatase: 63 U/L (ref 39–117)
Bilirubin, Direct: 0.1 mg/dL (ref 0.0–0.3)
Total Bilirubin: 0.7 mg/dL (ref 0.3–1.2)
Total Protein: 7.7 g/dL (ref 6.0–8.3)

## 2012-04-18 LAB — CBC WITH DIFFERENTIAL/PLATELET
Basophils Relative: 0.9 % (ref 0.0–3.0)
Eosinophils Relative: 0.8 % (ref 0.0–5.0)
Hemoglobin: 7.3 g/dL — CL (ref 12.0–15.0)
Lymphocytes Relative: 28.8 % (ref 12.0–46.0)
Lymphs Abs: 1.8 10*3/uL (ref 0.7–4.0)
MCHC: 29.9 g/dL — ABNORMAL LOW (ref 30.0–36.0)
Monocytes Absolute: 0.6 10*3/uL (ref 0.1–1.0)
Monocytes Relative: 10 % (ref 3.0–12.0)
Neutrophils Relative %: 59.5 % (ref 43.0–77.0)
RDW: 17.7 % — ABNORMAL HIGH (ref 11.5–14.6)

## 2012-04-18 LAB — COMPREHENSIVE METABOLIC PANEL
Albumin: 3.8 g/dL (ref 3.5–5.2)
Alkaline Phosphatase: 63 U/L (ref 39–117)
CO2: 25 mEq/L (ref 19–32)
GFR: 88.41 mL/min (ref >60.00)
Glucose, Bld: 84 mg/dL (ref 70–99)
Total Bilirubin: 0.7 mg/dL (ref 0.3–1.2)

## 2012-04-18 LAB — LIPID PANEL
Cholesterol: 148 mg/dL (ref 0–200)
LDL Cholesterol: 82 mg/dL (ref 0–99)
VLDL: 18.6 mg/dL (ref 0.0–40.0)

## 2012-04-18 LAB — TSH: TSH: 1.01 u[IU]/mL (ref 0.35–5.50)

## 2012-04-18 NOTE — Telephone Encounter (Signed)
Lab called with critical lab value - Hemoglobin 7.3

## 2012-04-18 NOTE — Patient Instructions (Addendum)
Ssleep trouble - maybe restless leg syndrome. We will check potassium and other labs. If normal you may benefit from Requip - a pill to help restless legs.  Heart - last seenm by Dr. Juanda Chance in 2004. You had a blocked coronary artery then. Your being short of breath worries me - will refer you to cardiology to help determine which will be the best test to assess your heart and whether you need anything done or any change in medications.  Nosebleeds - may have make you anemic, which would explain your ice-craving. Will check your blood counts today and also refer you to Dr. Flo Shanks - an ENT to look for any blood vessels that are bleeding that need to be treated.   A full report with all the lab results will be sent to you soon.

## 2012-04-18 NOTE — Progress Notes (Signed)
Subjective:     Patient ID: Leah Figueroa, female   DOB: 20-Dec-1922, 78 y.o.   MRN: [id number]  HPI Comments: Leah Figueroa is an 78 yo female with a history of myocardial infarction, hypertension, and hyperlipidemia who has come to clinic today for an annual Medicare wellness examination and management of other chronic and acute problems. She reported that she has been having trouble sleeping over the pastyear. She claims it has gotten worse. The patient states that when she lays down to sleep she cannot sit still. Once she falls asleep she rarely wake up at night. She claims her problem is falling asleep when she first lays down. She also reports that she has restless leg syndrome and kicks frequently at night. Leah Figueroa stated that she has been experiencing leg cramps at night that began a year ago.  Recently Leah Figueroa has been having nose bleeds that have been occuring once a week. She reports that although she has bleeding incidents once a week, there is persistent accumulation of blood in her nose. She states that he regularly picks her nose and believe that may contribute to her nose bleeds. Leah Figueroa claims she is has had a craving to eat ice. She also states that she gets short of breath more easily recently. This shortness of breath is not accompanied by chest pressure or pain.   The risk factors are reflected in the social history.  The roster of all physicians providing medical care to patient - is listed in the Snapshot section of the chart.  Activities of daily living:  The patient is 100% inedpendent in all ADLs: dressing, toileting, feeding as well as independent mobility  Home safety : The patient has smoke detectors in the home. They wear seatbelts.No firearms at home ( firearms are present in the home, kept in a safe fashion). There is no violence in the home.   There is no risks for hepatitis, STDs or HIV. There is no   history of blood transfusion. They have no travel history to  infectious disease endemic areas of the world.  The patient has seen their dentist in the last six month. They have not seen their eye doctor in the last year. They admit to any hearing difficulty and have not had audiologic testing in the last year.  They do not  have excessive sun exposure. Discussed the need for sun protection: hats, long sleeves and use of sunscreen if there is significant sun exposure.   Diet: the importance of a healthy diet is discussed. They do have a healthy diet.  The patient does not have a regular exercise program. The benefits of regular aerobic exercise were discussed.  Depression screen: there are no signs or vegative symptoms of depression- irritability, change in appetite, anhedonia, sadness/tearfullness.  Cognitive assessment: the patient manages all their financial and personal affairs and is actively engaged. They could relate day,date,year and events; recalled 3/3 objects at 3 minutes; performed clock-face test normally.  The following portions of the patient's history were reviewed and updated as appropriate: allergies, current medications, past family history, past medical history,  past surgical history, past social history  and problem list.  Vision, hearing, body mass index were assessed and reviewed.   During the course of the visit the patient was educated and counseled about appropriate screening and preventive services including : fall prevention , diabetes screening, nutrition counseling, colorectal cancer screening, and recommended immunizations.  Past Medical History  Diagnosis Date  . Other specified  personal history presenting hazards to health   . Personal history of arthritis   . Unspecified personal history presenting hazards to health   . Cramp of limb   . Acute myocardial infarction of other anterior wall, episode of care unspecified   . Personal history of malignant neoplasm of breast   . Unspecified essential hypertension   .  Personal history of other diseases of digestive system   . Unspecified hypothyroidism   . Other and unspecified hyperlipidemia    Past Surgical History  Procedure Date  . Right thyroid lobectomy   . Cataract extraction     left-2006, right 2011  . Intrathoracic goiter resection   . Cholecystectomy   . Lumpectomy for breast cancer 80's    left   Family History  Problem Relation Age of Onset  . Cancer Mother     colon  . Coronary artery disease Brother    History   Social History  . Marital Status: Widowed    Spouse Name: N/A    Number of Children: N/A  . Years of Education: N/A   Occupational History  . Not on file.   Social History Main Topics  . Smoking status: Never Smoker   . Smokeless tobacco: Never Used  . Alcohol Use: No  . Drug Use: Not on file  . Sexually Active: Not on file   Other Topics Concern  . Not on file   Social History Narrative   HSG. Married '48- 57 yrs/widoewed. 1 daughter - '59; 1 grandchild. 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  Lives alone. I-ADLs, manages books, pays bills.. End-of-Life Care: no heroic measures; No CPR, no mechanical ventilation.  Caffeine use/day:  None. Does Patient Exercise:  No    Current Outpatient Prescriptions on File Prior to Visit  Medication Sig Dispense Refill  . aspirin 81 MG tablet Take 81 mg by mouth daily.        Marland Kitchen levothyroxine (LEVOTHROID) 25 MCG tablet Take 1 tablet (25 mcg total) by mouth daily.  90 tablet  0  . lovastatin (MEVACOR) 20 MG tablet Take 1 tablet (20 mg total) by mouth at bedtime.  90 tablet  0  . DISCONTD: metoprolol (LOPRESSOR) 50 MG tablet Take 1 tablet (50 mg total) by mouth 2 (two) times daily.  180 tablet  3  . Dietary Management Product (AXONA PO) Take by mouth.        . metoprolol (TOPROL XL) 100 MG 24 hr tablet Take 1 tablet (100 mg total) by mouth daily.  90 tablet  3     Review of Systems  All other systems reviewed and are negative.       Objective:   Physical Exam    Vitals reviewed. Constitutional: She is oriented to person, place, and time. Vital signs are normal. She appears well-nourished. No distress.  HENT:  Right Ear: External ear normal.  Left Ear: External ear normal.  Mouth/Throat: Oropharynx is clear and moist. No oropharyngeal exudate.       Dentures in place on oral exam with moist mm and no pharyngeal exudate. PT's nose showed signs of previous bleeding. Nasal septum was absent.  Eyes: Pupils are equal, round, and reactive to light. Right eye exhibits no discharge. Left eye exhibits no discharge. No scleral icterus.  Neck: Normal range of motion. Neck supple. No JVD present. No tracheal deviation present. No thyromegaly present.       No cervical adenopathy. Left clavicular lymph node is enlarged, soft, and mobile.  Cardiovascular: Normal rate and regular rhythm.  Exam reveals no gallop and no friction rub.   Murmur (Holosystolic murmur best heard at the right upper sternal boarder .) heard.      2+ Carotid and radial pulses bl. 1+ pedal pulses bl.  Pulmonary/Chest: Effort normal and breath sounds normal. No respiratory distress. She has no wheezes. She has no rales. She exhibits no tenderness.  Abdominal: Soft. Bowel sounds are normal. She exhibits no distension. There is no tenderness.       No abdominal bruits auscultated. Liver margin at the costal boarder.  Musculoskeletal: She exhibits no edema and no tenderness.  Neurological: She is alert and oriented to person, place, and time.  Skin: Skin is warm. No rash noted. She is not diaphoretic.  Psychiatric: She has a normal mood and affect. Her behavior is normal. Judgment and thought content normal.       Assessment & Plan:     1. Annual Exam - The patient's physical exam was benign except for a heart murmur. This murmur is asymptomatic and requires no treatment at this time. Her medications and history were reviewed with her.  Plan - Obtain CMP, Lipid panel, and liver function tests.    2. Epistaxis - Patient picks her nose frequently and has bleeding events once a week. On exam she did not have a nasal septum. She was surprised to hear this. Her bleeding is likely due to trauma from the patient irritating her nose. She has pica, and may be iron deficient due to blood lose from these chronic nose bleeds.  Plan - Advise patient to refrain from picking her nose and seek medical attention if bleeding  becomes heavier than normal. Obtain a CBC to check HGB levels. Refer to ENT.  3. Shortness of Breath - Patient gets short of breath with exertion. She has not seen a cardiologist since 2004. Shortness of breath may be due to a newly occluded coronary artery or her possible anemia from nose bleeds.  Plan - Refer to cardiology to determine most appropriate workup.  4. Difficulty Sleeping - The patient may be experiencing insomnia due to restless leg syndrome. She experiences periodic urge to move her legs at night. This may be related to an iron deficiency cause by blood loss from her nosebleeds.   Plan - Check patient's potassium on CMP.

## 2012-04-19 ENCOUNTER — Observation Stay (HOSPITAL_COMMUNITY)
Admission: AD | Admit: 2012-04-19 | Discharge: 2012-04-20 | Disposition: A | Discharge status: Home / Self Care | Payer: Medicare Other | Source: Ambulatory Visit | Attending: Internal Medicine | Admitting: Internal Medicine

## 2012-04-19 ENCOUNTER — Telehealth: Payer: Self-pay | Admitting: *Deleted

## 2012-04-19 ENCOUNTER — Encounter (HOSPITAL_COMMUNITY): Payer: Self-pay

## 2012-04-19 DIAGNOSIS — Z7982 Long term (current) use of aspirin: Secondary | ICD-10-CM | POA: Insufficient documentation

## 2012-04-19 DIAGNOSIS — E039 Hypothyroidism, unspecified: Secondary | ICD-10-CM | POA: Diagnosis not present

## 2012-04-19 DIAGNOSIS — Z853 Personal history of malignant neoplasm of breast: Secondary | ICD-10-CM | POA: Insufficient documentation

## 2012-04-19 DIAGNOSIS — I10 Essential (primary) hypertension: Secondary | ICD-10-CM | POA: Insufficient documentation

## 2012-04-19 DIAGNOSIS — M129 Arthropathy, unspecified: Secondary | ICD-10-CM | POA: Diagnosis not present

## 2012-04-19 DIAGNOSIS — I252 Old myocardial infarction: Secondary | ICD-10-CM | POA: Diagnosis not present

## 2012-04-19 DIAGNOSIS — D500 Iron deficiency anemia secondary to blood loss (chronic): Secondary | ICD-10-CM | POA: Diagnosis not present

## 2012-04-19 DIAGNOSIS — Z79899 Other long term (current) drug therapy: Secondary | ICD-10-CM | POA: Insufficient documentation

## 2012-04-19 DIAGNOSIS — R040 Epistaxis: Secondary | ICD-10-CM | POA: Diagnosis not present

## 2012-04-19 DIAGNOSIS — E785 Hyperlipidemia, unspecified: Secondary | ICD-10-CM | POA: Insufficient documentation

## 2012-04-19 DIAGNOSIS — D649 Anemia, unspecified: Secondary | ICD-10-CM | POA: Diagnosis not present

## 2012-04-19 DIAGNOSIS — G4700 Insomnia, unspecified: Secondary | ICD-10-CM | POA: Diagnosis not present

## 2012-04-19 LAB — HEMOGLOBIN AND HEMATOCRIT, BLOOD
HCT: 31.8 % — ABNORMAL LOW (ref 36.0–46.0)
Hemoglobin: 9.8 g/dL — ABNORMAL LOW (ref 12.0–15.0)

## 2012-04-19 LAB — PREPARE RBC (CROSSMATCH)

## 2012-04-19 MED ORDER — ACETAMINOPHEN 325 MG PO TABS: 650.0000 mg | ORAL_TABLET | Freq: Four times a day (QID) | ORAL | Status: DC | PRN

## 2012-04-19 MED ORDER — FUROSEMIDE 10 MG/ML IJ SOLN
20.0000 mg | Freq: Once | INTRAMUSCULAR | Status: AC
  Administered 2012-04-19: 20 mg via INTRAVENOUS
  Filled 2012-04-19: qty 2

## 2012-04-19 MED ORDER — SODIUM CHLORIDE 0.9 % IV SOLN
INTRAVENOUS | Status: DC
  Administered 2012-04-19: 1000 mL via INTRAVENOUS

## 2012-04-19 MED ORDER — LEVOTHYROXINE SODIUM 25 MCG PO TABS
25.0000 ug | ORAL_TABLET | Freq: Every day | ORAL | Status: DC
  Administered 2012-04-19 – 2012-04-20 (×2): 25 ug via ORAL
  Filled 2012-04-19 (×3): qty 1

## 2012-04-19 MED ORDER — ZOLPIDEM TARTRATE 5 MG PO TABS
5.0000 mg | ORAL_TABLET | Freq: Once | ORAL | Status: AC
  Administered 2012-04-19: 5 mg via ORAL
  Filled 2012-04-19: qty 1

## 2012-04-19 MED ORDER — ACETAMINOPHEN 650 MG RE SUPP: 650.0000 mg | Freq: Four times a day (QID) | RECTAL | Status: DC | PRN

## 2012-04-19 MED ORDER — METOPROLOL TARTRATE 50 MG PO TABS
50.0000 mg | ORAL_TABLET | Freq: Two times a day (BID) | ORAL | Status: DC
  Administered 2012-04-19 (×2): 50 mg via ORAL
  Filled 2012-04-19 (×4): qty 1

## 2012-04-19 NOTE — H&P (Signed)
Leah Figueroa is an 78 y.o. female.   Chief Complaint: Shortness of breath with exertion, persistent epistaxis HPI: Leah Figueroa was seen 04/18/12 for CPX. She reported that she has been having daily nosebleeds. She admitted to SOB with exertion, no chest pain or diaphoresis. Lab in the office revealed a Hgb of 7.3g. She is now for transfusion for symptomatic anemia secondary to blood loss.   Past Medical History  Diagnosis Date  . Other specified personal history presenting hazards to health   . Personal history of arthritis   . Unspecified personal history presenting hazards to health   . Cramp of limb   . Acute myocardial infarction of other anterior wall, episode of care unspecified   . Personal history of malignant neoplasm of breast   . Unspecified essential hypertension   . Personal history of other diseases of digestive system   . Unspecified hypothyroidism   . Other and unspecified hyperlipidemia     Past Surgical History  Procedure Date  . Right thyroid lobectomy   . Cataract extraction     left-2006, right 2011  . Intrathoracic goiter resection   . Cholecystectomy   . Lumpectomy for breast cancer 80's    left    Family History  Problem Relation Age of Onset  . Cancer Mother     colon  . Coronary artery disease Brother    Social History:  reports that she has never smoked. She has never used smokeless tobacco. She reports that she does not drink alcohol. Her drug history not on file.  No current facility-administered medications on file prior to encounter.   Current Outpatient Prescriptions on File Prior to Encounter  Medication Sig Dispense Refill  . aspirin 81 MG tablet Take 81 mg by mouth daily.        . Dietary Management Product (AXONA PO) Take by mouth.        . levothyroxine (LEVOTHROID) 25 MCG tablet Take 1 tablet (25 mcg total) by mouth daily.  90 tablet  0  . lovastatin (MEVACOR) 20 MG tablet Take 1 tablet (20 mg total) by mouth at bedtime.  90 tablet  0  .  metoprolol (LOPRESSOR) 50 MG tablet Take 50 mg by mouth 2 (two) times daily.      . metoprolol (TOPROL XL) 100 MG 24 hr tablet Take 1 tablet (100 mg total) by mouth daily.  90 tablet  3   Allergies: No Known Allergies    Results for orders placed in visit on 04/18/12 (from the past 48 hour(s))  LIPID PANEL     Status: Normal   Collection Time   04/18/12  3:27 PM      Component Value Range Comment   Cholesterol 148  0 - 200 mg/dL ATP III Classification       Desirable:  < 200 mg/dL               Borderline High:  200 - 239 mg/dL          High:  > = 161 mg/dL   Triglycerides 09.6  0.0 - 149.0 mg/dL Normal:  <045 mg/dLBorderline High:  150 - 199 mg/dL   HDL 40.98  >11.91 mg/dL    VLDL 47.8  0.0 - 29.5 mg/dL    LDL Cholesterol 82  0 - 99 mg/dL    Total CHOL/HDL Ratio 3                  Men  Women1/2 Average Risk     3.4          3.3Average Risk          5.0          4.42X Average Risk          9.6          7.13X Average Risk          15.0          11.0                      COMPREHENSIVE METABOLIC PANEL     Status: Normal   Collection Time   04/18/12  3:27 PM      Component Value Range Comment   Sodium 138  135 - 145 mEq/L    Potassium 4.5  3.5 - 5.1 mEq/L    Chloride 104  96 - 112 mEq/L    CO2 25  19 - 32 mEq/L    Glucose, Bld 84  70 - 99 mg/dL    BUN 13  6 - 23 mg/dL    Creatinine, Ser 0.7  0.4 - 1.2 mg/dL    Total Bilirubin 0.7  0.3 - 1.2 mg/dL    Alkaline Phosphatase 63  39 - 117 U/L    AST 22  0 - 37 U/L    ALT 14  0 - 35 U/L    Total Protein 7.7  6.0 - 8.3 g/dL    Albumin 3.8  3.5 - 5.2 g/dL    Calcium 9.4  8.4 - 54.0 mg/dL    GFR 98.11  >91.47 mL/min   TSH     Status: Normal   Collection Time   04/18/12  3:27 PM      Component Value Range Comment   TSH 1.01  0.35 - 5.50 uIU/mL   HEPATIC FUNCTION PANEL     Status: Normal   Collection Time   04/18/12  3:27 PM      Component Value Range Comment   Total Bilirubin 0.7  0.3 - 1.2 mg/dL    Bilirubin, Direct 0.1  0.0 - 0.3  mg/dL    Alkaline Phosphatase 63  39 - 117 U/L    AST 22  0 - 37 U/L    ALT 14  0 - 35 U/L    Total Protein 7.7  6.0 - 8.3 g/dL    Albumin 3.8  3.5 - 5.2 g/dL   CBC WITH DIFFERENTIAL     Status: Abnormal   Collection Time   04/18/12  3:27 PM      Component Value Range Comment   WBC 6.1  4.5 - 10.5 K/uL    RBC 3.65 (*) 3.87 - 5.11 Mil/uL    Hemoglobin 7.3 cL (*) 12.0 - 15.0 g/dL    HCT 82.9 aL (*) 56.2 - 46.0 %    MCV 66.8 aL (*) 78.0 - 100.0 fl    MCHC 29.9 (*) 30.0 - 36.0 g/dL    RDW 13.0 (*) 86.5 - 14.6 %    Platelets 301.0  150.0 - 400.0 K/uL    Neutrophils Relative 59.5  43.0 - 77.0 %    Lymphocytes Relative 28.8  12.0 - 46.0 %    Monocytes Relative 10.0  3.0 - 12.0 %    Eosinophils Relative 0.8  0.0 - 5.0 %    Basophils Relative 0.9  0.0 - 3.0 %    Neutro Abs 3.6  1.4 -  7.7 K/uL    Lymphs Abs 1.8  0.7 - 4.0 K/uL    Monocytes Absolute 0.6  0.1 - 1.0 K/uL    Eosinophils Absolute 0.1  0.0 - 0.7 K/uL    Basophils Absolute 0.1  0.0 - 0.1 K/uL      Review of Systems  Constitutional: Negative for fever, chills and weight loss.  HENT: Negative.   Eyes: Negative.   Respiratory: Negative.   Cardiovascular: Negative.   Gastrointestinal: Negative.   Genitourinary: Negative.   Musculoskeletal: Negative.   Skin: Negative.   Neurological: Negative.  Negative for weakness.  Endo/Heme/Allergies: Negative.   Psychiatric/Behavioral: Negative.     There were no vitals taken for this visit. Physical Exam  Physical Exam 04/18/12 Vitals reviewed.  Constitutional: She is oriented to person, place, and time. Vital signs are normal. She appears well-nourished. No distress.  HENT:  Right Ear: External ear normal.  Left Ear: External ear normal.  Mouth/Throat: Oropharynx is clear and moist. No oropharyngeal exudate.  Dentures in place on oral exam with moist mm and no pharyngeal exudate. PT's nose showed signs of previous bleeding. Nasal septum was absent.  Eyes: Pupils are equal, round,  and reactive to light. Right eye exhibits no discharge. Left eye exhibits no discharge. No scleral icterus.  Neck: Normal range of motion. Neck supple. No JVD present. No tracheal deviation present. No thyromegaly present.  No cervical adenopathy. Left clavicular lymph node is enlarged, soft, and mobile.  Cardiovascular: Normal rate and regular rhythm. Exam reveals no gallop and no friction rub.  Murmur (Holosystolic murmur best heard at the right upper sternal boarder .) heard. 2+ Carotid and radial pulses bl. 1+ pedal pulses bl.  Pulmonary/Chest: Effort normal and breath sounds normal. No respiratory distress. She has no wheezes. She has no rales. She exhibits no tenderness.  Abdominal: Soft. Bowel sounds are normal. She exhibits no distension. There is no tenderness.  No abdominal bruits auscultated. Liver margin at the costal boarder.  Musculoskeletal: She exhibits no edema and no tenderness.  Neurological: She is alert and oriented to person, place, and time.  Skin: Skin is warm. No rash noted. She is not diaphoretic.  Psychiatric: She has a normal mood and affect. Her behavior is normal. Judgment and thought content normal.   Assessment/Plan Anemia - patient with symptomatic anemia due to chronic epistaxis.   Plan - T&C and transfuss 2 units of blood.   Follow -up h/h 4 hrs after transfusion  Epistaxis - will refer to ENT as outpatient.   Casimiro Needle [place] Fant 04/19/2012, 9:02 AM

## 2012-04-19 NOTE — Telephone Encounter (Signed)
Patient notified of need to be admit to [facility] for Blood Transfusion today due to low critical lab results.. Patient placement notified of this, spoke to [place]. [phone].

## 2012-04-20 ENCOUNTER — Other Ambulatory Visit: Payer: Self-pay | Admitting: Internal Medicine

## 2012-04-20 DIAGNOSIS — D649 Anemia, unspecified: Secondary | ICD-10-CM | POA: Diagnosis not present

## 2012-04-20 DIAGNOSIS — R040 Epistaxis: Secondary | ICD-10-CM

## 2012-04-20 LAB — TYPE AND SCREEN: ABO/RH(D): A POS

## 2012-04-20 NOTE — Progress Notes (Signed)
Pt D/C home. Alert and oriented, no new complains. Denies pain. Vitals within normal range for pt. D/C instructions done. Pt verbalizes understanding.

## 2012-04-20 NOTE — Progress Notes (Signed)
Pateint tolerated transfusion w/o incident. Hgb 7.3 rose to 9.8.  Plan- D/C home  F/u/ CBC Monday Sept 9th  ENT referral for definitive treatment of epistaxis.  For insomnia she is advised to try tylenol PM Dicated [phone]

## 2012-04-21 ENCOUNTER — Telehealth: Payer: Self-pay | Admitting: Internal Medicine

## 2012-04-21 DIAGNOSIS — R040 Epistaxis: Secondary | ICD-10-CM | POA: Diagnosis not present

## 2012-04-21 DIAGNOSIS — H905 Unspecified sensorineural hearing loss: Secondary | ICD-10-CM | POA: Diagnosis not present

## 2012-04-21 DIAGNOSIS — J3489 Other specified disorders of nose and nasal sinuses: Secondary | ICD-10-CM | POA: Diagnosis not present

## 2012-04-21 NOTE — Telephone Encounter (Signed)
Forward 4 pages from [facility] to Dr. Michael Norins for review on 04-21-12 ym °

## 2012-04-21 NOTE — Discharge Summary (Signed)
Leah Figueroa, Leah Figueroa                  ACCOUNT NO.:  [id number]  MEDICAL RECORD NO.:  [id number]  LOCATION:  1319                         FACILITY:  [facility]  PHYSICIAN:  Rosalyn Gess. Boaz Berisha, MD  DATE OF BIRTH:  November 05, 1922  DATE OF ADMISSION:  04/19/2012 DATE OF DISCHARGE:                              DISCHARGE SUMMARY   24-hour observation from 04/19/2012, to 09/55/2013.  ADMISSION DIAGNOSES: 1. Blood loss anemia. 2. Recurrent epistasis.  DISCHARGE DIAGNOSES: 1. Blood loss anemia. 2. Recurrent epistasis.  HISTORY OF PRESENT ILLNESS:  Ms. Vinton is a delightful 78 year old, who presented for physical exam on the day prior to admission.  She is complaining of persistent epistasis, slow loss of blood on a regular basis.  During her examination, she did complain of having some mild dyspnea on exertion, without chest pain.  Laboratory at the time of that visit revealed a hemoglobin of 7.3 g.  She was subsequently admitted to the hospital on observation status for transfusion to treat her shortness of breath symptoms associated with blood loss anemia.  Past medical history, family history, and social history are well documented in both her office visit note and the admission note.  HOSPITAL COURSE:  The patient was admitted to a med surg bed.  She was type crossed and transfused 2 units of blood.  Her hemoglobin rose from 7.3-9.8 g.  The patient tolerated transfusion without complications.  With the patient being adequately transfused with a stable hemoglobin at 9.8 g, she is ready for discharge to home.  DISCHARGE EXAMINATION:  VITAL SIGNS:  Temperature was 97.8, blood pressure was 170/66, pulse 55, respirations 18, O2 sats 97% on room air. GENERAL APPEARANCE:  This is a well-nourished, well-developed, well- preserved woman looking younger than her stated chronologic age. HEENT:  Unremarkable with conjunctiva and sclerae being clear. CARDIOVASCULAR:  2+ radial pulse.  Her precordium is  quiet.  She had a regular rate and rhythm.  PULMONARY:  No increased work of breathing and normal respirations were noted. ABDOMEN:  Soft.  No guarding or rebound.  No further examination conducted.  DISPOSITION:  Patient is discharged to home.  She will continue all of her home medications.  She is actually to come to the office for followup hemoglobin and hematocrit on Monday, April 24, 2012. Referral is being made for the patient to see Ear, Nose, and Throat consultation in regard to her persistent epistasis and also the finding of absence of the nasal septum.  Patient's condition at time of discharge dictation is stable and improved.     Rosalyn Gess Canaan Prue, MD     MEN/MEDQ  D:  04/20/2012  T:  04/21/2012  Job:  [id number]

## 2012-04-23 ENCOUNTER — Encounter: Payer: Self-pay | Admitting: Internal Medicine

## 2012-04-24 ENCOUNTER — Other Ambulatory Visit (INDEPENDENT_AMBULATORY_CARE_PROVIDER_SITE_OTHER): Payer: Medicare Other

## 2012-04-24 DIAGNOSIS — D649 Anemia, unspecified: Secondary | ICD-10-CM

## 2012-04-24 DIAGNOSIS — R040 Epistaxis: Secondary | ICD-10-CM

## 2012-04-24 LAB — CBC WITH DIFFERENTIAL/PLATELET
Basophils Absolute: 0.1 10*3/uL (ref 0.0–0.1)
Eosinophils Relative: 3.2 % (ref 0.0–5.0)
Lymphocytes Relative: 30.2 % (ref 12.0–46.0)
Lymphs Abs: 1.4 10*3/uL (ref 0.7–4.0)
Monocytes Relative: 10.2 % (ref 3.0–12.0)
Neutrophils Relative %: 55.1 % (ref 43.0–77.0)
Platelets: 240 10*3/uL (ref 150.0–400.0)
RDW: 22.5 % — ABNORMAL HIGH (ref 11.5–14.6)
WBC: 4.8 10*3/uL (ref 4.5–10.5)

## 2012-05-05 ENCOUNTER — Other Ambulatory Visit: Payer: Self-pay | Admitting: *Deleted

## 2012-05-05 MED ORDER — METOPROLOL TARTRATE 50 MG PO TABS: 50.0000 mg | ORAL_TABLET | Freq: Two times a day (BID) | ORAL | Status: DC

## 2012-05-05 MED ORDER — LOVASTATIN 20 MG PO TABS: 20.0000 mg | ORAL_TABLET | Freq: Every day | ORAL | Status: DC

## 2012-05-05 MED ORDER — LEVOTHYROXINE SODIUM 25 MCG PO TABS: 25.0000 ug | ORAL_TABLET | Freq: Every day | ORAL | Status: DC

## 2012-05-05 NOTE — Telephone Encounter (Signed)
REFILL TO [facility] LOPRESSOR, LEVOTHROID,MEVACOR

## 2012-06-07 ENCOUNTER — Telehealth: Payer: Self-pay | Admitting: Internal Medicine

## 2012-06-07 DIAGNOSIS — J3489 Other specified disorders of nose and nasal sinuses: Secondary | ICD-10-CM | POA: Diagnosis not present

## 2012-06-07 DIAGNOSIS — R040 Epistaxis: Secondary | ICD-10-CM | POA: Diagnosis not present

## 2012-06-07 NOTE — Telephone Encounter (Signed)
Received 3 pages from [facility], sent to Dr. Debby Bud. 06/07/12/sd

## 2012-09-20 DIAGNOSIS — H01009 Unspecified blepharitis unspecified eye, unspecified eyelid: Secondary | ICD-10-CM | POA: Diagnosis not present

## 2012-09-20 DIAGNOSIS — H04129 Dry eye syndrome of unspecified lacrimal gland: Secondary | ICD-10-CM | POA: Diagnosis not present

## 2012-09-20 DIAGNOSIS — H31019 Macula scars of posterior pole (postinflammatory) (post-traumatic), unspecified eye: Secondary | ICD-10-CM | POA: Diagnosis not present

## 2012-12-30 ENCOUNTER — Emergency Department (HOSPITAL_COMMUNITY): Payer: Medicare Other

## 2012-12-30 ENCOUNTER — Encounter (HOSPITAL_COMMUNITY): Payer: Self-pay | Admitting: Family Medicine

## 2012-12-30 ENCOUNTER — Emergency Department (HOSPITAL_COMMUNITY)
Admission: EM | Admit: 2012-12-30 | Discharge: 2012-12-30 | Disposition: A | Discharge status: Home / Self Care | Payer: Medicare Other | Attending: Emergency Medicine | Admitting: Emergency Medicine

## 2012-12-30 DIAGNOSIS — I252 Old myocardial infarction: Secondary | ICD-10-CM | POA: Insufficient documentation

## 2012-12-30 DIAGNOSIS — E039 Hypothyroidism, unspecified: Secondary | ICD-10-CM | POA: Insufficient documentation

## 2012-12-30 DIAGNOSIS — Z8739 Personal history of other diseases of the musculoskeletal system and connective tissue: Secondary | ICD-10-CM | POA: Insufficient documentation

## 2012-12-30 DIAGNOSIS — I10 Essential (primary) hypertension: Secondary | ICD-10-CM | POA: Diagnosis not present

## 2012-12-30 DIAGNOSIS — R51 Headache: Secondary | ICD-10-CM

## 2012-12-30 DIAGNOSIS — Z853 Personal history of malignant neoplasm of breast: Secondary | ICD-10-CM | POA: Diagnosis not present

## 2012-12-30 DIAGNOSIS — Z79899 Other long term (current) drug therapy: Secondary | ICD-10-CM | POA: Diagnosis not present

## 2012-12-30 DIAGNOSIS — Z7982 Long term (current) use of aspirin: Secondary | ICD-10-CM | POA: Insufficient documentation

## 2012-12-30 DIAGNOSIS — Z8719 Personal history of other diseases of the digestive system: Secondary | ICD-10-CM | POA: Insufficient documentation

## 2012-12-30 DIAGNOSIS — E785 Hyperlipidemia, unspecified: Secondary | ICD-10-CM | POA: Diagnosis not present

## 2012-12-30 LAB — SEDIMENTATION RATE: Sed Rate: 47 mm/hr — ABNORMAL HIGH (ref 0–22)

## 2012-12-30 MED ORDER — METOCLOPRAMIDE HCL 5 MG PO TABS: 5.0000 mg | ORAL_TABLET | Freq: Four times a day (QID) | ORAL | Status: DC

## 2012-12-30 MED ORDER — METOCLOPRAMIDE HCL 10 MG PO TABS
5.0000 mg | ORAL_TABLET | Freq: Once | ORAL | Status: AC
  Administered 2012-12-30: 5 mg via ORAL
  Filled 2012-12-30: qty 1

## 2012-12-30 NOTE — ED Provider Notes (Signed)
History     CSN: [id number]  Arrival date & time 12/30/12  1924   First MD Initiated Contact with Patient 12/30/12 2009      Chief Complaint  Patient presents with  . Headache    (Consider location/radiation/quality/duration/timing/severity/associated sxs/prior treatment) HPI Comments: Patient reports that she does not usually have headaches, since Monday had gradual onset of right for head and temple pain with occasional sharp stabs that runs across to the left side of her head. She denies rash, fever. She denies visual changes, focal weakness or numbness. She denies any neck pain or neck stiffness. She denies any congestion or symptoms suggestive of upper respiratory infection. She denies any nausea or vomiting. She reports no recent head trauma. Her medications include baby aspirin, medications for hypertension hyperlipidemia and hypothyroidism. She denies any recent new medications. She has had a small MI in the past, but no history of stroke or TIA. She has a remote history of breast cancer more than 20 years ago in the past, having had surgery and chemotherapy. She reports that she gets mammograms every year and has had no recurrences.  Patient is a 79 y.o. female presenting with headaches. The history is provided by the patient and a relative.  Headache Associated symptoms: no congestion, no cough, no pain, no fever, no nausea, no neck pain, no neck stiffness, no numbness, no photophobia, no sinus pressure and no sore throat     Past Medical History  Diagnosis Date  . Other specified personal history presenting hazards to health   . Personal history of arthritis   . Unspecified personal history presenting hazards to health   . Cramp of limb   . Acute myocardial infarction of other anterior wall, episode of care unspecified   . Personal history of malignant neoplasm of breast   . Unspecified essential hypertension   . Personal history of other diseases of digestive system   .  Unspecified hypothyroidism   . Other and unspecified hyperlipidemia     Past Surgical History  Procedure Laterality Date  . Right thyroid lobectomy    . Cataract extraction      left-2006, right 2011  . Intrathoracic goiter resection    . Cholecystectomy    . Lumpectomy for breast cancer  80's    left    Family History  Problem Relation Age of Onset  . Cancer Mother     colon  . Coronary artery disease Brother     History  Substance Use Topics  . Smoking status: Never Smoker   . Smokeless tobacco: Never Used  . Alcohol Use: No    OB History   Grav Para Term Preterm Abortions TAB SAB Ect Mult Living                  Review of Systems  Constitutional: Negative for fever and chills.  HENT: Negative for congestion, sore throat, neck pain, neck stiffness and sinus pressure.   Eyes: Negative for photophobia, pain and visual disturbance.  Respiratory: Negative for cough and shortness of breath.   Gastrointestinal: Negative for nausea.  Skin: Negative for rash.  Neurological: Positive for headaches. Negative for facial asymmetry, speech difficulty, weakness and numbness.  All other systems reviewed and are negative.    Allergies  Review of patient's allergies indicates no known allergies.  Home Medications   Current Outpatient Rx  Name  Route  Sig  Dispense  Refill  . aspirin EC 81 MG tablet   Oral  Take 81 mg by mouth every morning.         Marland Kitchen levothyroxine (SYNTHROID, LEVOTHROID) 25 MCG tablet   Oral   Take 25 mcg by mouth every morning.         . lovastatin (MEVACOR) 20 MG tablet   Oral   Take 1 tablet (20 mg total) by mouth at bedtime.   90 tablet   3   . metoprolol (LOPRESSOR) 50 MG tablet   Oral   Take 1 tablet (50 mg total) by mouth 2 (two) times daily.   180 tablet   3     BP 177/66  Pulse 65  Temp(Src) 98.4 F (36.9 C) (Oral)  Resp 16  Ht 5\' 3"  (1.6 m)  Wt 118 lb 5 oz (53.666 kg)  BMI 20.96 kg/m2  SpO2 97%  Physical Exam   Nursing note and vitals reviewed. Constitutional: She is oriented to person, place, and time. She appears well-developed and well-nourished. No distress.  HENT:  Head: Normocephalic and atraumatic.  Right Ear: External ear normal.  Left Ear: External ear normal.  Mouth/Throat: No oropharyngeal exudate.  Eyes: Conjunctivae and EOM are normal. No scleral icterus.  Neck: Normal range of motion. Neck supple. No JVD present. No tracheal deviation present. No thyromegaly present.  Cardiovascular: Normal rate and intact distal pulses.   No murmur heard. Pulmonary/Chest: Effort normal. No stridor. No respiratory distress. She has no wheezes.  Abdominal: Soft. She exhibits no distension. There is no tenderness.  Lymphadenopathy:    She has no cervical adenopathy.  Neurological: She is alert and oriented to person, place, and time. She exhibits normal muscle tone. Coordination normal.  Skin: Skin is warm and dry. No rash noted. She is not diaphoretic.    ED Course  Procedures (including critical care time)  Labs Reviewed  SEDIMENTATION RATE - Abnormal; Notable for the following:    Sed Rate 47 (*)    All other components within normal limits   Ct Head Wo Contrast  12/30/2012   *RADIOLOGY REPORT*  Clinical Data: New onset of headaches which is unusual for this patient. History of hypertension.  CT HEAD WITHOUT CONTRAST  Technique:  Contiguous axial images were obtained from the base of the skull through the vertex without contrast.  Comparison: None.  Findings: There is no evidence for acute infarction, intracranial hemorrhage, mass lesion, hydrocephalus, or extra-axial fluid. Moderate atrophy.  Chronic microvascular ischemic change is fairly advanced.  Vascular calcification most prominently noted in the carotid siphon regions and distal vertebrals.  Calvarium intact. No subdural hematoma.  No orbital findings.  No acute sinus or mastoid disease.  IMPRESSION: Age related atrophy and moderately  extensive chronic microvascular ischemic change.  No acute intracranial findings.  No visible paranasal sinus disease.   Original Report Authenticated By: Davonna Belling, M.D.     1. Headache     ra sat is 97% and I interpret to be normal    10:56 PM Based on usual guidelines, normal sed rate in a female at age 37, can be up to 18.  Pt's sed rate is 47, unlikely to be TA.  Pt did not have any tenderness over temporal artery. Head CT shows no acute. Pt is safe to be re-evaluated by PCP this week.  MDM  Pt has a non focal neuro exam here today.  NO stiff neck, was not sudden onset, has been present for >5 days.  Will get head CT due to age, and sed rate, also  due to age and location primarily in right forehead and temple area.  I cannot reproduce pain with palpation.          Gavin Pound. Oletta Lamas, MD 12/30/12 2321

## 2012-12-30 NOTE — Discharge Instructions (Signed)
General Headache Without Cause A headache is pain or discomfort felt around the head or neck area. The specific cause of a headache may not be found. There are many causes and types of headaches. A few common ones are:  Tension headaches.  Migraine headaches.  Cluster headaches.  Chronic daily headaches. HOME CARE INSTRUCTIONS   Keep all follow-up appointments with your caregiver or any specialist referral.  Only take over-the-counter or prescription medicines for pain or discomfort as directed by your caregiver.  Lie down in a dark, quiet room when you have a headache.  Keep a headache journal to find out what may trigger your migraine headaches. For example, write down:  What you eat and drink.  How much sleep you get.  Any change to your diet or medicines.  Try massage or other relaxation techniques.  Put ice packs or heat on the head and neck. Use these 3 to 4 times per day for 15 to 20 minutes each time, or as needed.  Limit stress.  Sit up straight, and do not tense your muscles.  Quit smoking if you smoke.  Limit alcohol use.  Decrease the amount of caffeine you drink, or stop drinking caffeine.  Eat and sleep on a regular schedule.  Get 7 to 9 hours of sleep, or as recommended by your caregiver.  Keep lights dim if bright lights bother you and make your headaches worse. SEEK MEDICAL CARE IF:   You have problems with the medicines you were prescribed.  Your medicines are not working.  You have a change from the usual headache.  You have nausea or vomiting. SEEK IMMEDIATE MEDICAL CARE IF:   Your headache becomes severe.  You have a fever.  You have a stiff neck.  You have loss of vision.  You have muscular weakness or loss of muscle control.  You start losing your balance or have trouble walking.  You feel faint or pass out.  You have severe symptoms that are different from your first symptoms. MAKE SURE YOU:   Understand these  instructions.  Will watch your condition.  Will get help right away if you are not doing well or get worse. Document Released: 08/02/2005 Document Revised: 10/25/2011 Document Reviewed: 08/18/2011 Exit[employer] Patient Information 2013 [employer].  

## 2012-12-30 NOTE — ED Notes (Signed)
Patient states that she has "never had a headache" except when she got nitroglycerin but that she has had a headache since earlier in the week. States headache is constant, worse at night and not completely relieved with Advil. States headache more on the right forehead area. Reports generally not feeling good before headaches started.

## 2013-01-01 ENCOUNTER — Telehealth: Payer: Self-pay | Admitting: Internal Medicine

## 2013-01-01 NOTE — Telephone Encounter (Signed)
Pt req phone call from the nurse. Please call pt back at this number if it is after 12pm /[phone].

## 2013-01-02 NOTE — Telephone Encounter (Signed)
Phone call from pt yesterday, I called her back this morning. She states she was seen in the ER Saturday night due to her blood pressure being high. She was advised to let her PCP know so records can be reviewed. (MEN pt). Please advise.

## 2013-01-02 NOTE — Telephone Encounter (Signed)
Pt notified and states she will call back to make her appt

## 2013-01-02 NOTE — Telephone Encounter (Signed)
Noted. OV w/any MD w/BP record Thx

## 2013-01-04 ENCOUNTER — Ambulatory Visit (INDEPENDENT_AMBULATORY_CARE_PROVIDER_SITE_OTHER): Payer: Medicare Other | Admitting: Internal Medicine

## 2013-01-04 ENCOUNTER — Encounter: Payer: Self-pay | Admitting: Internal Medicine

## 2013-01-04 VITALS — BP 162/80 | HR 59 | Temp 97.7°F | Ht 63.0 in | Wt 116.0 lb

## 2013-01-04 DIAGNOSIS — R51 Headache: Secondary | ICD-10-CM | POA: Diagnosis not present

## 2013-01-04 DIAGNOSIS — J309 Allergic rhinitis, unspecified: Secondary | ICD-10-CM

## 2013-01-04 DIAGNOSIS — I10 Essential (primary) hypertension: Secondary | ICD-10-CM

## 2013-01-04 DIAGNOSIS — R519 Headache, unspecified: Secondary | ICD-10-CM | POA: Insufficient documentation

## 2013-01-04 MED ORDER — AMLODIPINE BESYLATE 5 MG PO TABS: 5.0000 mg | ORAL_TABLET | Freq: Every day | ORAL | Status: DC

## 2013-01-04 NOTE — Assessment & Plan Note (Addendum)
Mild elev but o/w stable overall by history and exam, recent data reviewed with pt, and pt to continue medical treatment as before and add amlodipin 5 qd,  to f/u any worsening symptoms or concerns BP Readings from Last 3 Encounters:  01/04/13 162/80  12/30/12 195/86  04/20/12 176/66

## 2013-01-04 NOTE — Patient Instructions (Signed)
Please take all new medication as prescribed - the amlodipine 5 mg per day Please continue ALL other medications as before You can also take Claritin OTC for allergies (this does not make you sleepy)

## 2013-01-04 NOTE — Assessment & Plan Note (Signed)
Exam benign, pain resolved,  No other neuro complaints,  to f/u any worsening symptoms or concerns

## 2013-01-04 NOTE — Progress Notes (Signed)
Subjective:    Patient ID: Leah Figueroa, female    DOB: 18-Aug-1922, 79 y.o.   MRN: [id number]  HPI  Here after seen in ER May 17 with HA, CT head neg, exam ok not felt to be acute, and asked to f/u here. Pt states since then HA resolved, Does have persistent elev BP howevere, and is amenable to further medication though she tries to minimize med use overall.  Does have several wks ongoing nasal allergy symptoms with clearish congestion, itch and sneezing, without fever, pain, ST, cough, swelling or wheezing.  Pt denies chest pain, increased sob or doe, wheezing, orthopnea, PND, increased LE swelling, palpitations, dizziness or syncope.  Pt denies new neurological symptoms such as new headache, or facial or extremity weakness or numbness   Pt denies polydipsia, polyuria Past Medical History  Diagnosis Date  . Other specified personal history presenting hazards to health   . Personal history of arthritis   . Unspecified personal history presenting hazards to health   . Cramp of limb   . Acute myocardial infarction of other anterior wall, episode of care unspecified   . Personal history of malignant neoplasm of breast   . Unspecified essential hypertension   . Personal history of other diseases of digestive system   . Unspecified hypothyroidism   . Other and unspecified hyperlipidemia    Past Surgical History  Procedure Laterality Date  . Right thyroid lobectomy    . Cataract extraction      left-2006, right 2011  . Intrathoracic goiter resection    . Cholecystectomy    . Lumpectomy for breast cancer  80's    left    reports that she has never smoked. She has never used smokeless tobacco. She reports that she does not drink alcohol or use illicit drugs. family history includes Cancer in her mother and Coronary artery disease in her brother. No Known Allergies Current Outpatient Prescriptions on File Prior to Visit  Medication Sig Dispense Refill  . aspirin EC 81 MG tablet Take 81 mg by  mouth every morning.      Marland Kitchen levothyroxine (SYNTHROID, LEVOTHROID) 25 MCG tablet Take 25 mcg by mouth every morning.      . lovastatin (MEVACOR) 20 MG tablet Take 1 tablet (20 mg total) by mouth at bedtime.  90 tablet  3  . metoCLOPramide (REGLAN) 5 MG tablet Take 1 tablet (5 mg total) by mouth every 6 (six) hours.  20 tablet  0  . metoprolol (LOPRESSOR) 50 MG tablet Take 1 tablet (50 mg total) by mouth 2 (two) times daily.  180 tablet  3   No current facility-administered medications on file prior to visit.   Review of Systems  Constitutional: Negative for unexpected weight change, or unusual diaphoresis  HENT: Negative for tinnitus.   Eyes: Negative for photophobia and visual disturbance.  Respiratory: Negative for choking and stridor.   Gastrointestinal: Negative for vomiting and blood in stool.  Genitourinary: Negative for hematuria and decreased urine volume.  Musculoskeletal: Negative for acute joint swelling Skin: Negative for color change and wound.  Neurological: Negative for tremors and numbness other than noted  Psychiatric/Behavioral: Negative for decreased concentration or  hyperactivity.       Objective:   Physical Exam BP 162/80  Pulse 59  Temp(Src) 97.7 F (36.5 C) (Oral)  Ht 5\' 3"  (1.6 m)  Wt 116 lb (52.617 kg)  BMI 20.55 kg/m2  SpO2 96% VS noted,  Constitutional: Pt appears well-developed and well-nourished.  HENT: Head: NCAT.  Right Ear: External ear normal.  Left Ear: External ear normal.  Bilat tm's with mild erythema.  Max sinus areas non tender.  Pharynx with mild erythema, no exudate Eyes: Conjunctivae and EOM are normal. Pupils are equal, round, and reactive to light.  Neck: Normal range of motion. Neck supple.  Cardiovascular: Normal rate and regular rhythm.   Pulmonary/Chest: Effort normal and breath sounds normal.  Abd:  Soft, NT, non-distended, + BS Neurological: Pt is alert. Not confused  Skin: Skin is warm. No erythema.  Psychiatric: Pt  behavior is normal. Thought content normal.     Assessment & Plan:

## 2013-01-04 NOTE — Assessment & Plan Note (Signed)
Mild, for claritin OTC prn,  to f/u any worsening symptoms or concerns

## 2013-02-06 ENCOUNTER — Encounter: Payer: Self-pay | Admitting: Internal Medicine

## 2013-02-06 ENCOUNTER — Ambulatory Visit (INDEPENDENT_AMBULATORY_CARE_PROVIDER_SITE_OTHER): Payer: Medicare Other | Admitting: Internal Medicine

## 2013-02-06 VITALS — BP 140/76 | HR 63 | Temp 97.6°F | Ht 60.0 in | Wt 116.8 lb

## 2013-02-06 DIAGNOSIS — I10 Essential (primary) hypertension: Secondary | ICD-10-CM | POA: Diagnosis not present

## 2013-02-06 MED ORDER — FUROSEMIDE 20 MG PO TABS: 20.0000 mg | ORAL_TABLET | Freq: Every day | ORAL | Status: DC

## 2013-02-06 NOTE — Patient Instructions (Addendum)
It is good to see you.  Your blood pressure today is OK. In looking back over the past year there have been several occasions when the top number, SBP, was too high. BP Readings from Last 3 Encounters:  02/06/13 140/76  01/04/13 162/80  12/30/12 195/86    Plan Continue the metoprolol - take this at bedtime, it can make you drowsy  STOP the amlodipine (which you never did start)  START Furosemide (a water pill) once every morning to help with the top number.  If you can get you blood pressure checked at the drug store  Come back to see me for a blood pressure check in 3-4 weeks.

## 2013-02-06 NOTE — Progress Notes (Signed)
  Subjective:    Patient ID: Leah Figueroa, female    DOB: 1922/10/17, 79 y.o.   MRN: [id number]  HPI Leah Figueroa presents for follow up of Blood pressure. She was in the ED May 17th for headache - negative eval including CT head. She saw Dr. Jonny Figueroa May 22 - headache had resolved but BP was not well controlled leading to change in medications, adding amlodipine, but she did not take it. Blood pressure today is adequately controlled. In the interval she has had one episode of HA that was brief and mild. She does report that one of her medications may be making her drowsy.  PMH, FamHx and SocHx reviewed for any changes and relevance.  Current Outpatient Prescriptions on File Prior to Visit  Medication Sig Dispense Refill  . amLODipine (NORVASC) 5 MG tablet Take 1 tablet (5 mg total) by mouth daily.  90 tablet  3  . aspirin EC 81 MG tablet Take 81 mg by mouth every morning.      Marland Kitchen levothyroxine (SYNTHROID, LEVOTHROID) 25 MCG tablet Take 25 mcg by mouth every morning.      . lovastatin (MEVACOR) 20 MG tablet Take 1 tablet (20 mg total) by mouth at bedtime.  90 tablet  3  . metoCLOPramide (REGLAN) 5 MG tablet Take 1 tablet (5 mg total) by mouth every 6 (six) hours.  20 tablet  0  . metoprolol (LOPRESSOR) 50 MG tablet Take 1 tablet (50 mg total) by mouth 2 (two) times daily.  180 tablet  3   No current facility-administered medications on file prior to visit.      Review of Systems System review is negative for any constitutional, cardiac, pulmonary, GI or neuro symptoms or complaints other than as described in the HPI.     Objective:   Physical Exam Filed Vitals:   02/06/13 0925  BP: 140/76  Pulse: 63  Temp: 97.6 F (36.4 C)   BP Readings from Last 3 Encounters:  02/06/13 140/76  01/04/13 162/80  12/30/12 195/86   Gen'l - thin, WNWD white elderly woman in no distress HEENT_ C&S clear, PERRLA Cor- 2+ radial, RRR, II/VI systolic murmur heard best at the RSB Pulm - clear Neuro - A&O x  3. Normal gait, independent in strength.        Assessment & Plan:

## 2013-02-06 NOTE — Assessment & Plan Note (Signed)
BP Readings from Last 3 Encounters:  02/06/13 140/76  01/04/13 162/80  12/30/12 195/86   Problem is systolic hypertension.  Plan Continue BB  D/C CCB  Start low dose diuretic - furosemide 20 mg qd.   Return 3 wks for BP check and Bmet

## 2013-02-27 ENCOUNTER — Ambulatory Visit (INDEPENDENT_AMBULATORY_CARE_PROVIDER_SITE_OTHER): Payer: Medicare Other | Admitting: Internal Medicine

## 2013-02-27 ENCOUNTER — Encounter: Payer: Self-pay | Admitting: Internal Medicine

## 2013-02-27 VITALS — BP 140/72 | HR 60 | Temp 97.0°F | Wt 115.8 lb

## 2013-02-27 DIAGNOSIS — I10 Essential (primary) hypertension: Secondary | ICD-10-CM | POA: Diagnosis not present

## 2013-02-28 NOTE — Assessment & Plan Note (Signed)
BP Readings from Last 3 Encounters:  02/27/13 140/72  02/06/13 140/76  01/04/13 162/80   Stable control on present medications

## 2013-02-28 NOTE — Progress Notes (Signed)
  Subjective:    Patient ID: Leah Figueroa, female    DOB: 1923/04/13, 79 y.o.   MRN: [id number]  HPI Leah Figueroa presents for follow up of HTN. At her last visit in June a diuretic was added to her regimen to control excursions of SBP. She has tolerated this pretty well. She does have increased urinary frequency for several hours which is a problem if she is going. She has had no headache, double vision or other problems associated with HTN.  PMH, FamHx and SocHx reviewed for any changes and relevance.  Current Outpatient Prescriptions on File Prior to Visit  Medication Sig Dispense Refill  . aspirin EC 81 MG tablet Take 81 mg by mouth every morning.      . furosemide (LASIX) 20 MG tablet Take 1 tablet (20 mg total) by mouth daily.  30 tablet  3  . levothyroxine (SYNTHROID, LEVOTHROID) 25 MCG tablet Take 25 mcg by mouth every morning.      . lovastatin (MEVACOR) 20 MG tablet Take 1 tablet (20 mg total) by mouth at bedtime.  90 tablet  3  . metoprolol (LOPRESSOR) 50 MG tablet Take 1 tablet (50 mg total) by mouth 2 (two) times daily.  180 tablet  3   No current facility-administered medications on file prior to visit.      Review of Systems System review is negative for any constitutional, cardiac, pulmonary, GI or neuro symptoms or complaints other than as described in the HPI.     Objective:   Physical Exam Filed Vitals:   02/27/13 1038  BP: 140/72  Pulse: 60  Temp: 97 F (36.1 C)   gen'l - a very spry 79 y/o in no distress Cor - RRR Pulm - normal respirations Neuro - bright and alert, normal get up and go, normal gait.       Assessment & Plan:

## 2013-04-30 ENCOUNTER — Other Ambulatory Visit: Payer: Self-pay

## 2013-04-30 MED ORDER — FUROSEMIDE 20 MG PO TABS: 20.0000 mg | ORAL_TABLET | Freq: Every day | ORAL | Status: DC

## 2013-05-21 ENCOUNTER — Other Ambulatory Visit: Payer: Self-pay | Admitting: Internal Medicine

## 2013-05-22 ENCOUNTER — Other Ambulatory Visit: Payer: Self-pay

## 2013-05-22 MED ORDER — FUROSEMIDE 20 MG PO TABS: 20.0000 mg | ORAL_TABLET | Freq: Every day | ORAL | Status: DC

## 2013-06-04 DIAGNOSIS — S0003XA Contusion of scalp, initial encounter: Secondary | ICD-10-CM | POA: Diagnosis not present

## 2013-06-04 DIAGNOSIS — Z23 Encounter for immunization: Secondary | ICD-10-CM | POA: Diagnosis not present

## 2013-06-04 DIAGNOSIS — S0100XA Unspecified open wound of scalp, initial encounter: Secondary | ICD-10-CM | POA: Diagnosis not present

## 2013-06-05 DIAGNOSIS — Z23 Encounter for immunization: Secondary | ICD-10-CM | POA: Diagnosis not present

## 2013-06-05 DIAGNOSIS — S0100XA Unspecified open wound of scalp, initial encounter: Secondary | ICD-10-CM | POA: Diagnosis not present

## 2013-06-21 ENCOUNTER — Other Ambulatory Visit: Payer: Self-pay

## 2013-07-24 ENCOUNTER — Ambulatory Visit (INDEPENDENT_AMBULATORY_CARE_PROVIDER_SITE_OTHER): Payer: Medicare Other | Admitting: Internal Medicine

## 2013-07-24 ENCOUNTER — Other Ambulatory Visit (INDEPENDENT_AMBULATORY_CARE_PROVIDER_SITE_OTHER): Payer: Medicare Other

## 2013-07-24 ENCOUNTER — Encounter: Payer: Self-pay | Admitting: Internal Medicine

## 2013-07-24 VITALS — BP 130/82 | HR 59 | Temp 97.9°F | Wt 115.2 lb

## 2013-07-24 DIAGNOSIS — D649 Anemia, unspecified: Secondary | ICD-10-CM

## 2013-07-24 DIAGNOSIS — E039 Hypothyroidism, unspecified: Secondary | ICD-10-CM

## 2013-07-24 DIAGNOSIS — I10 Essential (primary) hypertension: Secondary | ICD-10-CM | POA: Diagnosis not present

## 2013-07-24 DIAGNOSIS — E785 Hyperlipidemia, unspecified: Secondary | ICD-10-CM

## 2013-07-24 DIAGNOSIS — Z23 Encounter for immunization: Secondary | ICD-10-CM

## 2013-07-24 LAB — LIPID PANEL
Cholesterol: 159 mg/dL (ref 0–200)
HDL: 41.3 mg/dL (ref >39.00)
VLDL: 19.4 mg/dL (ref 0.0–40.0)

## 2013-07-24 LAB — HEPATIC FUNCTION PANEL
ALT: 13 U/L (ref 0–35)
AST: 21 U/L (ref 0–37)
Alkaline Phosphatase: 67 U/L (ref 39–117)
Bilirubin, Direct: 0.1 mg/dL (ref 0.0–0.3)
Total Bilirubin: 0.6 mg/dL (ref 0.3–1.2)

## 2013-07-24 LAB — COMPREHENSIVE METABOLIC PANEL
ALT: 13 U/L (ref 0–35)
CO2: 26 mEq/L (ref 19–32)
Calcium: 8.9 mg/dL (ref 8.4–10.5)
Chloride: 103 mEq/L (ref 96–112)
Creatinine, Ser: 0.7 mg/dL (ref 0.4–1.2)
GFR: 91.29 mL/min (ref >60.00)
Glucose, Bld: 95 mg/dL (ref 70–99)
Total Bilirubin: 0.6 mg/dL (ref 0.3–1.2)

## 2013-07-24 LAB — HEMOGLOBIN AND HEMATOCRIT, BLOOD
HCT: 32.1 % — ABNORMAL LOW (ref 36.0–46.0)
Hemoglobin: 10.3 g/dL — ABNORMAL LOW (ref 12.0–15.0)

## 2013-07-24 LAB — TSH: TSH: 1.29 u[IU]/mL (ref 0.35–5.50)

## 2013-07-24 NOTE — Patient Instructions (Addendum)
Pleasure seeing you today Leah Figueroa!  1) Please continue to take your blood pressure medications as you have been. Your blood pressure was excellent in the office.   2) You received a pneumoniae vaccine today called prevnar.   3) You received a prescription for the shingles vaccine. You can take this prescription to the pharmacist to see if your insurance will cover the cost of the vaccine. If your insurance covers the vaccine, you can get the vaccine. Since you received a pneumoniae vaccine on 07/24/2013 you should not get the shingles vaccine until after 08/24/2013.  4) You got blood drawn today for lab work to check your sugars, thyroid and liver function. You will receive the results of the lab work.

## 2013-07-24 NOTE — Progress Notes (Signed)
Pre visit review using our clinic review tool, if applicable. No additional management support is needed unless otherwise documented below in the visit note. 

## 2013-07-24 NOTE — Progress Notes (Signed)
Subjective:     Patient ID: Leah Figueroa, female   DOB: Sep 15, 1922, 79 y.o.   MRN: [id number]  HPI Leah Figueroa is a 79 yo female with PMH of HTN, hyperlipidemia and hypothyroidism who presents today a follow up on her blood pressure.   Her BP is 130/82 in the office. She denies any headaches, palpitations, dizziness or blurry vision. She has some trouble going to sleep. She is able to sleep for a couple hours if she is able to fall asleep. She is tired during the daytime. When she is tired at night, she cannot sleep. Instead she continues to work when she is tired. 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   She takes her bp medications in the morning.  She takes her lovastatin at night along with her second dose of metoprolol. She is regularly taking her thyroid medicaiton. She denies any symptoms of hypo or hyperthyroidism.   PMH, FamHx and SocHx reviewed for any changes and relevance.  Current Outpatient Prescriptions on File Prior to Visit  Medication Sig Dispense Refill  . aspirin EC 81 MG tablet Take 81 mg by mouth every morning.      . furosemide (LASIX) 20 MG tablet Take 1 tablet (20 mg total) by mouth daily.  90 tablet  3  . levothyroxine (SYNTHROID, LEVOTHROID) 25 MCG tablet Take 1 tablet by mouth  daily  90 tablet  1  . lovastatin (MEVACOR) 20 MG tablet Take 1 tablet by mouth at  bedtime  90 tablet  1  . metoprolol (LOPRESSOR) 50 MG tablet Take 1 tablet by mouth two  times daily  180 tablet  1   No current facility-administered medications on file prior to visit.    Review of Systems Negative except as mentioned in the HPI.    Objective:   Physical Exam Gen: Well appearing, pleasant elderly woman.  HEENT: PEERL. MMM. Nares patent. No lymphadenopathy. No thyromegaly.  CV: RRR. Holosystolic ejection murmur 2/6 best heard over R sternal border. 2+ radial pulse bilaterally Pulm: Lungs clear to auscultation bilaterally. No crackles, wheezes or rhonchi. Normal work of  breathing. Neuro: AOx3. EOM intact without nystagmus. Facial smile symmetric. 5/5 grip strength bilaterally.   Recent Results (from the past 2160 hour(s))  HEPATIC FUNCTION PANEL     Status: None   Collection Time    07/24/13 11:23 AM      Result Value Range   Total Bilirubin 0.6  0.3 - 1.2 mg/dL   Bilirubin, Direct 0.1  0.0 - 0.3 mg/dL   Alkaline Phosphatase 67  39 - 117 U/L   AST 21  0 - 37 U/L   ALT 13  0 - 35 U/L   Total Protein 7.5  6.0 - 8.3 g/dL   Albumin 3.8  3.5 - 5.2 g/dL  TSH     Status: None   Collection Time    07/24/13 11:23 AM      Result Value Range   TSH 1.29  0.35 - 5.50 uIU/mL  COMPREHENSIVE METABOLIC PANEL     Status: None   Collection Time    07/24/13 11:23 AM      Result Value Range   Sodium 135  135 - 145 mEq/L   Potassium 3.9  3.5 - 5.1 mEq/L   Chloride 103  96 - 112 mEq/L   CO2 26  19 - 32 mEq/L   Glucose, Bld 95  70 - 99 mg/dL   BUN 10  6 - 23  mg/dL   Creatinine, Ser 0.7  0.4 - 1.2 mg/dL   Total Bilirubin 0.6  0.3 - 1.2 mg/dL   Alkaline Phosphatase 67  39 - 117 U/L   AST 21  0 - 37 U/L   ALT 13  0 - 35 U/L   Total Protein 7.5  6.0 - 8.3 g/dL   Albumin 3.8  3.5 - 5.2 g/dL   Calcium 8.9  8.4 - 40.9 mg/dL   GFR 81.19  >14.78 mL/min  LIPID PANEL     Status: None   Collection Time    07/24/13 11:23 AM      Result Value Range   Cholesterol 159  0 - 200 mg/dL   Comment: ATP III Classification       Desirable:  < 200 mg/dL               Borderline High:  200 - 239 mg/dL          High:  > = 295 mg/dL   Triglycerides 62.1  0.0 - 149.0 mg/dL   Comment: Normal:  <308 mg/dLBorderline High:  150 - 199 mg/dL   HDL 65.78  >46.96 mg/dL   VLDL 29.5  0.0 - 28.4 mg/dL   LDL Cholesterol 98  0 - 99 mg/dL   Total CHOL/HDL Ratio 4     Comment:                Men          Women1/2 Average Risk     3.4          3.3Average Risk          5.0          4.42X Average Risk          9.6          7.13X Average Risk          15.0          11.0                       HEMOGLOBIN AND HEMATOCRIT, BLOOD     Status: Abnormal   Collection Time    07/24/13 11:23 AM      Result Value Range   Hemoglobin 10.3 (*) 12.0 - 15.0 g/dL   HCT 13.2 (*) 44.0 - 10.2 %       Assessment and Plan:    Leah Figueroa is a 79 yo female with PMH of HTN, hyperlipidemia and hypothyroidism who presents today a follow up on her blood pressure.   1) HTN: Blood pressure is well controlled on her current regimen (loop diuretic and beta blocker).  07/24/13 130/82 02/27/13 140/72  02/06/13 140/76  01/04/13 162/80   2) Hypothyroidism: Denies any symptoms of hypo or hyperthyroidism. No changes to medication at this time. TSH ordered to evaluate thyroid function.   3) Hyperlipidemia: Continue lovastatin. Get a hepatic panel, CBC and lipid panel.

## 2013-07-25 NOTE — Assessment & Plan Note (Signed)
BP Readings from Last 3 Encounters:  07/24/13 130/82  02/27/13 140/72  02/06/13 140/76   Adequate control (JNC-8)  Plan Continue present medications

## 2013-07-25 NOTE — Assessment & Plan Note (Signed)
Taking and tolerating "statin" therapy. Discussed value vs age and she prefers to continue treatment w/ h/o CAD/MI.  Lab reveals LDL slightly above goal of 80 but adequate with no need to change dose of medication.

## 2013-07-25 NOTE — Assessment & Plan Note (Signed)
Lab Results  Component Value Date   TSH 1.29 07/24/2013   Good control on present dose of medication.  Plan Continue present regimen

## 2013-07-25 NOTE — Assessment & Plan Note (Signed)
Hgb has remained stable and patient is asymptomatic

## 2013-10-26 ENCOUNTER — Other Ambulatory Visit: Payer: Self-pay | Admitting: Internal Medicine

## 2013-11-27 ENCOUNTER — Other Ambulatory Visit: Payer: Self-pay | Admitting: *Deleted

## 2013-11-27 ENCOUNTER — Other Ambulatory Visit: Payer: Self-pay

## 2013-11-27 MED ORDER — LEVOTHYROXINE SODIUM 25 MCG PO TABS: ORAL_TABLET | ORAL | Status: DC

## 2013-11-27 MED ORDER — LOVASTATIN 20 MG PO TABS: ORAL_TABLET | ORAL | Status: DC

## 2013-11-27 MED ORDER — METOPROLOL TARTRATE 50 MG PO TABS: ORAL_TABLET | ORAL | Status: DC

## 2013-12-04 ENCOUNTER — Ambulatory Visit: Payer: Medicare Other | Admitting: Physician Assistant

## 2013-12-18 ENCOUNTER — Encounter: Payer: Self-pay | Admitting: Internal Medicine

## 2013-12-18 ENCOUNTER — Ambulatory Visit (INDEPENDENT_AMBULATORY_CARE_PROVIDER_SITE_OTHER): Payer: Medicare Other | Admitting: Internal Medicine

## 2013-12-18 VITALS — BP 160/80 | HR 72 | Temp 98.2°F | Resp 16 | Wt 111.0 lb

## 2013-12-18 DIAGNOSIS — I10 Essential (primary) hypertension: Secondary | ICD-10-CM

## 2013-12-18 DIAGNOSIS — Z853 Personal history of malignant neoplasm of breast: Secondary | ICD-10-CM

## 2013-12-18 DIAGNOSIS — D649 Anemia, unspecified: Secondary | ICD-10-CM | POA: Diagnosis not present

## 2013-12-18 DIAGNOSIS — E039 Hypothyroidism, unspecified: Secondary | ICD-10-CM | POA: Diagnosis not present

## 2013-12-18 MED ORDER — FUROSEMIDE 20 MG PO TABS
20.0000 mg | ORAL_TABLET | Freq: Every day | ORAL | Status: DC
Start: 2013-12-18 — End: 2015-04-08

## 2013-12-18 MED ORDER — LOVASTATIN 20 MG PO TABS: ORAL_TABLET | ORAL | Status: DC

## 2013-12-18 MED ORDER — METOPROLOL TARTRATE 50 MG PO TABS: ORAL_TABLET | ORAL | Status: DC

## 2013-12-18 MED ORDER — LEVOTHYROXINE SODIUM 25 MCG PO TABS: ORAL_TABLET | ORAL | Status: DC

## 2013-12-18 MED ORDER — VITAMIN D 1000 UNITS PO TABS: 1000.0000 [IU] | ORAL_TABLET | Freq: Every day | ORAL | Status: AC

## 2013-12-18 NOTE — Progress Notes (Signed)
   Subjective:    HPI  Switching PC from Dr Debby BudNorins  The patient presents for a follow-up of  chronic hypertension, chronic CAD controlled with medicines  Wt Readings from Last 3 Encounters:  12/18/13 111 lb (50.349 kg)  07/24/13 115 lb 3.2 oz (52.254 kg)  02/27/13 115 lb 12.8 oz (52.527 kg)   BP Readings from Last 3 Encounters:  12/18/13 160/80  07/24/13 130/82  02/27/13 140/72       Review of Systems  Constitutional: Negative for chills, activity change, appetite change, fatigue and unexpected weight change.  HENT: Negative for congestion, mouth sores and sinus pressure.   Eyes: Negative for visual disturbance.  Respiratory: Negative for cough and chest tightness.   Gastrointestinal: Negative for nausea and abdominal pain.  Genitourinary: Negative for frequency, difficulty urinating and vaginal pain.  Musculoskeletal: Negative for back pain and gait problem.  Skin: Negative for pallor and rash.  Neurological: Negative for dizziness, tremors, weakness, numbness and headaches.  Psychiatric/Behavioral: Negative for confusion and sleep disturbance.       Objective:   Physical Exam  Constitutional: She appears well-developed. No distress.  HENT:  Head: Normocephalic.  Right Ear: External ear normal.  Left Ear: External ear normal.  Nose: Nose normal.  Mouth/Throat: Oropharynx is clear and moist.  Eyes: Conjunctivae are normal. Pupils are equal, round, and reactive to light. Right eye exhibits no discharge. Left eye exhibits no discharge.  Neck: Normal range of motion. Neck supple. No JVD present. No tracheal deviation present. No thyromegaly present.  Cardiovascular: Normal rate, regular rhythm and normal heart sounds.   Pulmonary/Chest: No stridor. No respiratory distress. She has no wheezes.  Abdominal: Soft. Bowel sounds are normal. She exhibits no distension and no mass. There is no tenderness. There is no rebound and no guarding.  Musculoskeletal: She exhibits no  edema and no tenderness.  Lymphadenopathy:    She has no cervical adenopathy.  Neurological: She displays normal reflexes. No cranial nerve deficit. She exhibits normal muscle tone. Coordination normal.  Skin: No rash noted. No erythema.  Psychiatric: She has a normal mood and affect. Her behavior is normal. Judgment and thought content normal.    Lab Results  Component Value Date   WBC 4.8 04/24/2012   HGB 10.3* 07/24/2013   HCT 32.1* 07/24/2013   PLT 240.0 04/24/2012   GLUCOSE 95 07/24/2013   CHOL 159 07/24/2013   TRIG 97.0 07/24/2013   HDL 41.30 07/24/2013   LDLCALC 98 07/24/2013   ALT 13 07/24/2013   ALT 13 07/24/2013   AST 21 07/24/2013   AST 21 07/24/2013   NA 135 07/24/2013   K 3.9 07/24/2013   CL 103 07/24/2013   CREATININE 0.7 07/24/2013   BUN 10 07/24/2013   CO2 26 07/24/2013   TSH 1.29 07/24/2013         Assessment & Plan:

## 2013-12-18 NOTE — Assessment & Plan Note (Signed)
Good BP at home Continue with current prescription therapy as reflected on the Med list. NAS diet

## 2013-12-18 NOTE — Assessment & Plan Note (Signed)
Continue with current prescription therapy as reflected on the Med list.  

## 2013-12-18 NOTE — Assessment & Plan Note (Signed)
Chronic. No change ?

## 2013-12-18 NOTE — Patient Instructions (Signed)
Low salt diet

## 2013-12-18 NOTE — Progress Notes (Signed)
Pre visit review using our clinic review tool, if applicable. No additional management support is needed unless otherwise documented below in the visit note. 

## 2013-12-18 NOTE — Assessment & Plan Note (Signed)
Vit D 

## 2013-12-19 ENCOUNTER — Telehealth: Payer: Self-pay | Admitting: Internal Medicine

## 2013-12-19 NOTE — Telephone Encounter (Signed)
Relevant patient education assigned to patient using Emmi. ° °

## 2014-01-11 ENCOUNTER — Ambulatory Visit (INDEPENDENT_AMBULATORY_CARE_PROVIDER_SITE_OTHER): Payer: Medicare Other | Admitting: Internal Medicine

## 2014-01-11 ENCOUNTER — Ambulatory Visit (INDEPENDENT_AMBULATORY_CARE_PROVIDER_SITE_OTHER)
Admission: RE | Admit: 2014-01-11 | Discharge: 2014-01-11 | Disposition: A | Discharge status: Home / Self Care | Payer: Medicare Other | Source: Ambulatory Visit | Attending: Internal Medicine | Admitting: Internal Medicine

## 2014-01-11 ENCOUNTER — Encounter: Payer: Self-pay | Admitting: Internal Medicine

## 2014-01-11 VITALS — BP 128/68 | HR 60 | Temp 98.0°F | Resp 16 | Ht 63.0 in | Wt 111.5 lb

## 2014-01-11 DIAGNOSIS — S59912A Unspecified injury of left forearm, initial encounter: Secondary | ICD-10-CM | POA: Insufficient documentation

## 2014-01-11 DIAGNOSIS — S299XXA Unspecified injury of thorax, initial encounter: Secondary | ICD-10-CM

## 2014-01-11 DIAGNOSIS — S59919A Unspecified injury of unspecified forearm, initial encounter: Secondary | ICD-10-CM

## 2014-01-11 DIAGNOSIS — S6990XA Unspecified injury of unspecified wrist, hand and finger(s), initial encounter: Secondary | ICD-10-CM

## 2014-01-11 DIAGNOSIS — S298XXA Other specified injuries of thorax, initial encounter: Secondary | ICD-10-CM | POA: Diagnosis not present

## 2014-01-11 DIAGNOSIS — M79609 Pain in unspecified limb: Secondary | ICD-10-CM | POA: Diagnosis not present

## 2014-01-11 DIAGNOSIS — S59909A Unspecified injury of unspecified elbow, initial encounter: Secondary | ICD-10-CM | POA: Diagnosis not present

## 2014-01-11 DIAGNOSIS — R079 Chest pain, unspecified: Secondary | ICD-10-CM | POA: Diagnosis not present

## 2014-01-11 NOTE — Patient Instructions (Signed)
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
## 2014-01-13 NOTE — Assessment & Plan Note (Signed)
Exam and xrays are normal This is a contusion She does not want anything for pain

## 2014-01-13 NOTE — Assessment & Plan Note (Signed)
The xray is normal She does not want anything for pain

## 2014-01-23 ENCOUNTER — Encounter: Payer: Self-pay | Admitting: Internal Medicine

## 2014-01-23 ENCOUNTER — Ambulatory Visit (INDEPENDENT_AMBULATORY_CARE_PROVIDER_SITE_OTHER): Payer: Medicare Other | Admitting: Internal Medicine

## 2014-01-23 VITALS — BP 142/76 | HR 64 | Temp 97.2°F | Wt 112.0 lb

## 2014-01-23 DIAGNOSIS — R1319 Other dysphagia: Secondary | ICD-10-CM | POA: Diagnosis not present

## 2014-01-23 DIAGNOSIS — R071 Chest pain on breathing: Secondary | ICD-10-CM

## 2014-01-23 DIAGNOSIS — R0789 Other chest pain: Secondary | ICD-10-CM

## 2014-01-23 MED ORDER — TRAMADOL HCL 50 MG PO TABS: 50.0000 mg | ORAL_TABLET | Freq: Three times a day (TID) | ORAL | Status: DC | PRN

## 2014-01-23 NOTE — Patient Instructions (Signed)
If food does hang up or stick frequently; gastroenterology evaluation necessary to rule out stricture. Such is usually due to chronic reflux of stomach acid. Reflux of gastric acid may be asymptomatic as this may occur mainly during sleep.The triggers for reflux  include stress; the "aspirin family" ; alcohol; peppermint; and caffeine (coffee, tea, cola, and chocolate). The aspirin family would include aspirin and the nonsteroidal agents such as ibuprofen &  Naproxen. Tylenol would not cause reflux. If having symptoms ; food & drink should be avoided for @ least 2 hours before going to bed.

## 2014-01-23 NOTE — Progress Notes (Signed)
Pre visit review using our clinic review tool, if applicable. No additional management support is needed unless otherwise documented below in the visit note. 
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
## 2014-01-30 ENCOUNTER — Encounter: Payer: Self-pay | Admitting: Internal Medicine

## 2014-02-08 DIAGNOSIS — M2540 Effusion, unspecified joint: Secondary | ICD-10-CM | POA: Diagnosis not present

## 2014-02-08 DIAGNOSIS — M1120 Other chondrocalcinosis, unspecified site: Secondary | ICD-10-CM | POA: Diagnosis not present

## 2014-02-19 DIAGNOSIS — M545 Low back pain, unspecified: Secondary | ICD-10-CM | POA: Diagnosis not present

## 2014-02-19 DIAGNOSIS — M5430 Sciatica, unspecified side: Secondary | ICD-10-CM | POA: Diagnosis not present

## 2014-04-08 ENCOUNTER — Encounter: Payer: Self-pay | Admitting: Internal Medicine

## 2014-04-08 ENCOUNTER — Ambulatory Visit (INDEPENDENT_AMBULATORY_CARE_PROVIDER_SITE_OTHER): Payer: Medicare Other | Admitting: Internal Medicine

## 2014-04-08 VITALS — BP 134/70 | HR 60 | Ht 63.0 in | Wt 105.8 lb

## 2014-04-08 DIAGNOSIS — R1310 Dysphagia, unspecified: Secondary | ICD-10-CM | POA: Diagnosis not present

## 2014-04-08 NOTE — Patient Instructions (Addendum)
You have been scheduled for a Barium Esophogram at [facility] (1st floor of the hospital) on 04/19/14 at 11:00 AM. Please arrive 15 minutes prior to your appointment for registration. Make certain not to have anything to eat or drink 6 hours prior to your test. If you need to reschedule for any reason, please contact radiology at [phone] to do so. __________________________________________________________________ A barium swallow is an examination that concentrates on views of the esophagus. This tends to be a double contrast exam (barium and two liquids which, when combined, create a gas to distend the wall of the oesophagus) or single contrast (non-ionic iodine based). The study is usually tailored to your symptoms so a good history is essential. Attention is paid during the study to the form, structure and configuration of the esophagus, looking for functional disorders (such as aspiration, dysphagia, achalasia, motility and reflux) EXAMINATION You may be asked to change into a gown, depending on the type of swallow being performed. A radiologist and radiographer will perform the procedure. The radiologist will advise you of the type of contrast selected for your procedure and direct you during the exam. You will be asked to stand, sit or lie in several different positions and to hold a small amount of fluid in your mouth before being asked to swallow while the imaging is performed .In some instances you may be asked to swallow barium coated marshmallows to assess the motility of a solid food bolus. The exam can be recorded as a digital or video fluoroscopy procedure. POST PROCEDURE It will take 1-2 days for the barium to pass through your system. To facilitate this, it is important, unless otherwise directed, to increase your fluids for the next 24-48hrs and to resume your normal diet.  This test typically takes about 30 minutes to  perform. __________________________________________________________________________________

## 2014-04-08 NOTE — Progress Notes (Signed)
HISTORY OF PRESENT ILLNESS:  Leah Figueroa is a 80 y.o. female with past medical history as listed below. She is sent today regarding "choking spells". She is accompanied by her daughter. Patient reports a one-year history of intermittent "choking spells" with or without eating. This occurs a few times per week. No history of stroke, neck surgery, or aspiration pneumonia. She does have occasional indigestion for which she takes antacids. Her appetite is good. Weight stable. No prior history of GI problems or GI evaluations. She did have thyroid surgery 30 years ago. Possibly, some upper esophageal type dysphagia. GI review of systems is otherwise negative.  REVIEW OF SYSTEMS:  All non-GI ROS negative except for arthritis, visual change, hearing problems, cramps, leg pain, insomnia  Past Medical History  Diagnosis Date  . Other specified personal history presenting hazards to health(V15.89)   . Personal history of arthritis   . Unspecified personal history presenting hazards to health   . Cramp of limb   . Acute myocardial infarction of other anterior wall, episode of care unspecified   . Personal history of malignant neoplasm of breast   . Unspecified essential hypertension   . Personal history of other diseases of digestive system   . Unspecified hypothyroidism   . Other and unspecified hyperlipidemia     Past Surgical History  Procedure Laterality Date  . Right thyroid lobectomy    . Cataract extraction      left-2006, right 2011  . Intrathoracic goiter resection    . Cholecystectomy    . Lumpectomy for breast cancer  80's    left  . Back surgery      Social History Fransisca E Lurry  reports that she has never smoked. She has never used smokeless tobacco. She reports that she does not drink alcohol or use illicit drugs.  family history includes Breast cancer in her mother; Colon cancer in her mother; Coronary artery disease in her brother.  No Known Allergies     PHYSICAL  EXAMINATION: Vital signs: BP 134/70  Pulse 60  Ht  (1.6 m)  Wt 105 lb 12.8 oz (47.991 kg)  BMI 18.75 kg/m2 General: Well-developed thin elderly female, well-nourished, no acute distress HEENT: Sclerae are anicteric, conjunctiva pink. Oral mucosa intact Lungs: Clear Heart: Regular Abdomen: soft, nontender, nondistended, no obvious ascites, no peritoneal signs, normal bowel sounds. No organomegaly. Extremities: No edema Psychiatric: alert and oriented x3. Cooperative   ASSESSMENT:  #1. Dysphagia. Patient describes aspiration and upper esophageal dysphagia. 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   PLAN:  #1. Barium swallow with tablet. #2. Pending the results, patient may need modified barium swallow was these pathology, upper endoscopy with esophageal dilation, or expectant management.

## 2014-04-19 ENCOUNTER — Ambulatory Visit (HOSPITAL_COMMUNITY)
Admission: RE | Admit: 2014-04-19 | Discharge: 2014-04-19 | Disposition: A | Discharge status: Home / Self Care | Payer: Medicare Other | Source: Ambulatory Visit | Attending: Internal Medicine | Admitting: Internal Medicine

## 2014-04-19 DIAGNOSIS — K228 Other specified diseases of esophagus: Secondary | ICD-10-CM | POA: Diagnosis not present

## 2014-04-19 DIAGNOSIS — K449 Diaphragmatic hernia without obstruction or gangrene: Secondary | ICD-10-CM | POA: Diagnosis not present

## 2014-04-19 DIAGNOSIS — R1310 Dysphagia, unspecified: Secondary | ICD-10-CM | POA: Diagnosis present

## 2014-04-19 DIAGNOSIS — K224 Dyskinesia of esophagus: Secondary | ICD-10-CM | POA: Diagnosis not present

## 2014-04-19 DIAGNOSIS — K2289 Other specified disease of esophagus: Secondary | ICD-10-CM | POA: Diagnosis not present

## 2014-04-25 ENCOUNTER — Other Ambulatory Visit: Payer: Self-pay

## 2014-04-25 ENCOUNTER — Other Ambulatory Visit (HOSPITAL_COMMUNITY): Payer: Self-pay | Admitting: Internal Medicine

## 2014-04-25 DIAGNOSIS — R1310 Dysphagia, unspecified: Secondary | ICD-10-CM

## 2014-04-25 DIAGNOSIS — T17900A Unspecified foreign body in respiratory tract, part unspecified causing asphyxiation, initial encounter: Secondary | ICD-10-CM

## 2014-04-25 DIAGNOSIS — T17900S Unspecified foreign body in respiratory tract, part unspecified causing asphyxiation, sequela: Secondary | ICD-10-CM

## 2014-04-30 ENCOUNTER — Ambulatory Visit (HOSPITAL_COMMUNITY): Payer: Medicare Other

## 2014-05-09 ENCOUNTER — Ambulatory Visit (HOSPITAL_COMMUNITY): Payer: Medicare Other

## 2014-05-10 ENCOUNTER — Ambulatory Visit (HOSPITAL_COMMUNITY)
Admission: RE | Admit: 2014-05-10 | Discharge: 2014-05-10 | Disposition: A | Discharge status: Home / Self Care | Payer: Medicare Other | Source: Ambulatory Visit

## 2014-05-10 ENCOUNTER — Ambulatory Visit (HOSPITAL_COMMUNITY): Payer: Medicare Other

## 2014-05-10 ENCOUNTER — Ambulatory Visit (HOSPITAL_COMMUNITY)
Admission: RE | Admit: 2014-05-10 | Discharge: 2014-05-10 | Disposition: A | Discharge status: Home / Self Care | Payer: Medicare Other | Source: Ambulatory Visit | Attending: Internal Medicine | Admitting: Internal Medicine

## 2014-05-10 ENCOUNTER — Other Ambulatory Visit (HOSPITAL_COMMUNITY): Payer: Self-pay | Admitting: Internal Medicine

## 2014-05-10 ENCOUNTER — Other Ambulatory Visit (HOSPITAL_COMMUNITY): Payer: Medicare Other

## 2014-05-10 ENCOUNTER — Ambulatory Visit (HOSPITAL_COMMUNITY): Admission: RE | Admit: 2014-05-10 | Payer: Medicare Other | Source: Ambulatory Visit

## 2014-05-10 ENCOUNTER — Other Ambulatory Visit: Payer: Self-pay | Admitting: Internal Medicine

## 2014-05-10 DIAGNOSIS — K449 Diaphragmatic hernia without obstruction or gangrene: Secondary | ICD-10-CM | POA: Diagnosis not present

## 2014-05-10 DIAGNOSIS — R1310 Dysphagia, unspecified: Secondary | ICD-10-CM | POA: Diagnosis not present

## 2014-05-10 DIAGNOSIS — I252 Old myocardial infarction: Secondary | ICD-10-CM | POA: Insufficient documentation

## 2014-05-10 DIAGNOSIS — E039 Hypothyroidism, unspecified: Secondary | ICD-10-CM | POA: Insufficient documentation

## 2014-05-10 DIAGNOSIS — I10 Essential (primary) hypertension: Secondary | ICD-10-CM | POA: Diagnosis not present

## 2014-05-10 DIAGNOSIS — E785 Hyperlipidemia, unspecified: Secondary | ICD-10-CM | POA: Insufficient documentation

## 2014-05-10 DIAGNOSIS — Z853 Personal history of malignant neoplasm of breast: Secondary | ICD-10-CM | POA: Diagnosis not present

## 2014-05-10 DIAGNOSIS — T17900S Unspecified foreign body in respiratory tract, part unspecified causing asphyxiation, sequela: Secondary | ICD-10-CM

## 2014-05-10 DIAGNOSIS — T17900A Unspecified foreign body in respiratory tract, part unspecified causing asphyxiation, initial encounter: Secondary | ICD-10-CM

## 2014-05-10 DIAGNOSIS — R1312 Dysphagia, oropharyngeal phase: Secondary | ICD-10-CM | POA: Diagnosis not present

## 2014-05-10 NOTE — Procedures (Signed)
Objective Swallowing Evaluation: Modified Barium Swallowing Study  Patient Details  Name: Leah Figueroa MRN: [id number] Date of Birth: 1922-11-07  Today's Date: 05/10/2014 Time: 1315-1350 SLP Time Calculation (min): 35 min  Past Medical History:  Past Medical History  Diagnosis Date  . Other specified personal history presenting hazards to health(V15.89)   . Personal history of arthritis   . Unspecified personal history presenting hazards to health   . Cramp of limb   . Acute myocardial infarction of other anterior wall, episode of care unspecified   . Personal history of malignant neoplasm of breast   . Unspecified essential hypertension   . Personal history of other diseases of digestive system   . Unspecified hypothyroidism   . Other and unspecified hyperlipidemia    Past Surgical History:  Past Surgical History  Procedure Laterality Date  . Right thyroid lobectomy    . Cataract extraction      left-2006, right 2011  . Intrathoracic goiter resection    . Cholecystectomy    . Lumpectomy for breast cancer  80's    left  . Back surgery     HPI:  80 yo female referred for OP MBS.  Pt with PMH + for breast cancer, arthritis, right thyroid lobectomy, intrathoracic goiter resection approximately 30 years ago.  Barium swallow completed 04/19/2014 showed mild silent aspiration, severe esophageal dysmotility, large hiatal hernia, negative for mass.  Barium tablet transited into hernia with mild delay in distal esophagus due to poor motility and tortuosity.  Pt reported to Dr Marina Goodell episodes of choking spells with and wthout po.  Question for CP HTN per Dr Marina Goodell.  Pt arrived with her daughter Leah Figueroa for her MBS.  Choking occurs daily per daughter with no specific indication of time of day/meal.  Pt has not had significant weight loss nor pneumonias nor has she required the heimlich manuever.       Assessment / Plan / Recommendation Clinical Impression  Dysphagia Diagnosis: Mild pharyngeal  phase dysphagia;Mild oral phase dysphagia  Clinical impression: Mild oropharyngeal dysphagia without aspiration or penetration of any consistency tested.  Pt demonstrated minimally delayed oral transiting and piecemealing.  Pharyngeal tongue base retraction was impaired resulting in vallecular and tongue base stasis WITHOUT pt awareness.  Chin tuck posture and dry swallows decreased amount of residuals.  In addition, pt demonstrated ability to expectorate to clear vallecular residuals she was unable to swallow per SLP verbal cue.   There were only minimal residuals at pyriform sinus and above UES.  SLP advised pt to try compensation strategies to see if decrease amount of "choking" symptoms she is experiencing.  Using live video and verbal/visual feedback, effective strategies reinforced.    Pt inquired re: choking on saliva during church - SLP advised her to ask her MD re: this issue.  Pt may benefit from referral to OP SLP for dysphagia tx.  Thanks for this referral.      Treatment Recommendation  Defer treatment plan to SLP at (Comment) (OP)    Diet Recommendation Regular;Thin liquid   Medication Administration:  (as tolerated, advised with pudding if problematic) Supervision: Patient able to [place] feed Compensations: Slow rate;Small sips/bites (start meal with liquid, dry swallows, follow solids with liquids, expectorate at end of meal) Postural Changes and/or Swallow Maneuvers: Chin tuck;Upright 30-60 min after meal;Seated upright 90 degrees    Other  Recommendations   n/a  Follow Up Recommendations  Outpatient SLP      General Date of Onset: 05/10/14 HPI:  80 yo female referred for OP MBS.  Pt with PMH + for breast cancer, arthritis, right thyroid lobectomy, intrathoracic goiter resection approximately 30 years ago.  Barium swallow completed 04/19/2014 showed mild silent aspiration, severe esophageal dysmotility, large hiatal hernia, negative for mass.  Barium tablet transited into hernia  with mild delay in distal esophagus due to poor motility and tortuosity.  Pt reported to Dr Marina Goodell episodes of choking spells with and wthout po.  Question for CP HTN per Dr Marina Goodell.  Pt arrived with her daughter Leah Figueroa for her MBS.  Choking occurs daily per daughter with no specific indication of time of day/meal.  Pt has not had significant weight loss nor pneumonias nor has she required the heimlich manuever.   Type of Study: Modified Barium Swallowing Study Reason for Referral: Objectively evaluate swallowing function Diet Prior to this Study: Regular;Thin liquids Temperature Spikes Noted: No Respiratory Status: Room air Oral Cavity - Dentition: Dentures, top (lower partial) Oral Motor / Sensory Function: Within functional limits (? soft palate hanging lower on left side) [place]-Feeding Abilities: Able to feed [place] Patient Positioning: Upright in chair Baseline Vocal Quality: Clear;Low vocal intensity (daughter reports pt's voice has become lower loudness in the last year) Volitional Cough: Strong Volitional Swallow: Able to elicit Anatomy: Within functional limits Pharyngeal Secretions: Not observed secondary MBS    Reason for Referral Objectively evaluate swallowing function   Oral Phase Oral Preparation/Oral Phase Oral Phase: WFL Oral - Thin Oral - Thin Cup: Piecemeal swallowing Oral - Solids Oral - Puree: Piecemeal swallowing   Pharyngeal Phase Pharyngeal Phase Pharyngeal Phase: Impaired Pharyngeal - Nectar Pharyngeal - Nectar Cup: Pharyngeal residue - valleculae Pharyngeal - Thin Pharyngeal - Thin Cup: Reduced tongue base retraction;Pharyngeal residue - valleculae Pharyngeal - Thin Straw: Reduced tongue base retraction;Pharyngeal residue - valleculae Pharyngeal - Solids Pharyngeal - Puree: Premature spillage to valleculae;Reduced tongue base retraction;Pharyngeal residue - valleculae Pharyngeal - Regular: Premature spillage to valleculae;Reduced tongue base retraction;Pharyngeal  residue - valleculae Pharyngeal Phase - Comment Pharyngeal Comment: chin tuck posture aids in decreasing amount of pharyngeal residuals  Cervical Esophageal Phase    GO    Cervical Esophageal Phase Cervical Esophageal Phase: Impaired Cervical Esophageal Phase - Comment Cervical Esophageal Comment: minimal amount of pyriform sinus stasis noted, did not examine below UES region,  residuals were retained to larger extent at vallecular region    Functional Assessment Tool Used: mbs. clinical judgement Functional Limitations: Swallowing Swallow Current Status (R6045): At least 1 percent but less than 20 percent impaired, limited or restricted Swallow Goal Status [phone]): At least 1 percent but less than 20 percent impaired, limited or restricted Swallow Discharge Status [phone]): At least 1 percent but less than 20 percent impaired, limited or restricted    Donavan Burnet, [facility] SLP [phone]

## 2014-05-29 DIAGNOSIS — Z961 Presence of intraocular lens: Secondary | ICD-10-CM | POA: Diagnosis not present

## 2014-05-29 DIAGNOSIS — H31011 Macula scars of posterior pole (postinflammatory) (post-traumatic), right eye: Secondary | ICD-10-CM | POA: Diagnosis not present

## 2014-05-29 DIAGNOSIS — H524 Presbyopia: Secondary | ICD-10-CM | POA: Diagnosis not present

## 2014-08-23 ENCOUNTER — Ambulatory Visit: Payer: Medicare Other

## 2014-09-03 DIAGNOSIS — I10 Essential (primary) hypertension: Secondary | ICD-10-CM | POA: Diagnosis not present

## 2014-09-03 DIAGNOSIS — R413 Other amnesia: Secondary | ICD-10-CM | POA: Diagnosis not present

## 2014-09-03 DIAGNOSIS — E780 Pure hypercholesterolemia: Secondary | ICD-10-CM | POA: Diagnosis not present

## 2014-09-03 DIAGNOSIS — E039 Hypothyroidism, unspecified: Secondary | ICD-10-CM | POA: Diagnosis not present

## 2014-11-15 DIAGNOSIS — I10 Essential (primary) hypertension: Secondary | ICD-10-CM | POA: Diagnosis not present

## 2014-12-24 ENCOUNTER — Other Ambulatory Visit: Payer: Self-pay | Admitting: Internal Medicine

## 2015-02-18 DIAGNOSIS — Z1389 Encounter for screening for other disorder: Secondary | ICD-10-CM | POA: Diagnosis not present

## 2015-02-18 DIAGNOSIS — I10 Essential (primary) hypertension: Secondary | ICD-10-CM | POA: Diagnosis not present

## 2015-03-17 DIAGNOSIS — I639 Cerebral infarction, unspecified: Secondary | ICD-10-CM

## 2015-03-17 HISTORY — DX: Cerebral infarction, unspecified: I63.9

## 2015-03-22 ENCOUNTER — Encounter (HOSPITAL_COMMUNITY): Payer: Self-pay | Admitting: Emergency Medicine

## 2015-03-22 ENCOUNTER — Emergency Department (HOSPITAL_COMMUNITY): Payer: Medicare Other

## 2015-03-22 ENCOUNTER — Inpatient Hospital Stay (HOSPITAL_COMMUNITY)
Admission: EM | Admit: 2015-03-22 | Discharge: 2015-03-26 | DRG: 062 | Disposition: A | Discharge status: Rehabilitation Facility | Payer: Medicare Other | Attending: Neurology | Admitting: Neurology

## 2015-03-22 DIAGNOSIS — I6789 Other cerebrovascular disease: Secondary | ICD-10-CM | POA: Diagnosis not present

## 2015-03-22 DIAGNOSIS — Z7982 Long term (current) use of aspirin: Secondary | ICD-10-CM

## 2015-03-22 DIAGNOSIS — I2510 Atherosclerotic heart disease of native coronary artery without angina pectoris: Secondary | ICD-10-CM | POA: Diagnosis present

## 2015-03-22 DIAGNOSIS — E785 Hyperlipidemia, unspecified: Secondary | ICD-10-CM | POA: Diagnosis present

## 2015-03-22 DIAGNOSIS — H53462 Homonymous bilateral field defects, left side: Secondary | ICD-10-CM | POA: Diagnosis present

## 2015-03-22 DIAGNOSIS — G8194 Hemiplegia, unspecified affecting left nondominant side: Secondary | ICD-10-CM | POA: Diagnosis not present

## 2015-03-22 DIAGNOSIS — R531 Weakness: Secondary | ICD-10-CM | POA: Diagnosis not present

## 2015-03-22 DIAGNOSIS — I639 Cerebral infarction, unspecified: Secondary | ICD-10-CM | POA: Diagnosis present

## 2015-03-22 DIAGNOSIS — D649 Anemia, unspecified: Secondary | ICD-10-CM | POA: Diagnosis not present

## 2015-03-22 DIAGNOSIS — H53469 Homonymous bilateral field defects, unspecified side: Secondary | ICD-10-CM | POA: Diagnosis present

## 2015-03-22 DIAGNOSIS — R4189 Other symptoms and signs involving cognitive functions and awareness: Secondary | ICD-10-CM | POA: Diagnosis present

## 2015-03-22 DIAGNOSIS — R414 Neurologic neglect syndrome: Secondary | ICD-10-CM | POA: Diagnosis present

## 2015-03-22 DIAGNOSIS — I252 Old myocardial infarction: Secondary | ICD-10-CM

## 2015-03-22 DIAGNOSIS — R739 Hyperglycemia, unspecified: Secondary | ICD-10-CM | POA: Diagnosis present

## 2015-03-22 DIAGNOSIS — F4489 Other dissociative and conversion disorders: Secondary | ICD-10-CM | POA: Diagnosis not present

## 2015-03-22 DIAGNOSIS — E039 Hypothyroidism, unspecified: Secondary | ICD-10-CM | POA: Diagnosis present

## 2015-03-22 DIAGNOSIS — I10 Essential (primary) hypertension: Secondary | ICD-10-CM | POA: Diagnosis present

## 2015-03-22 DIAGNOSIS — Z681 Body mass index (BMI) 19 or less, adult: Secondary | ICD-10-CM | POA: Diagnosis not present

## 2015-03-22 DIAGNOSIS — Z79899 Other long term (current) drug therapy: Secondary | ICD-10-CM

## 2015-03-22 DIAGNOSIS — Z853 Personal history of malignant neoplasm of breast: Secondary | ICD-10-CM

## 2015-03-22 DIAGNOSIS — I63411 Cerebral infarction due to embolism of right middle cerebral artery: Secondary | ICD-10-CM | POA: Diagnosis not present

## 2015-03-22 NOTE — ED Provider Notes (Signed)
CSN: [id number]     Arrival date & time 03/22/15  2251 History  This chart was scribed for Shon Baton, MD by Phillis Haggis, ED Scribe. This patient was seen in room B19C/B19C and patient care was started at 11:44 PM.    Chief Complaint  Patient presents with  . Weakness  . Altered Mental Status   The history is provided by the patient. The history is limited by the condition of the patient. No language interpreter was used.   HPI Comments: Leah Figueroa is a 81 y.o. female history of coronary artery disease, hypertension who presents to the Emergency Department complaining of weakness and altered mental status onset 3 hours ago. Brought in by EMS from home. Reports that she fell over in chair due to weakness at approximately 9 PM. Nurse reports that daughter told EMS that the pt was weak on the left side, fell over to the right, was confused and unable to answer questions; states pt is hard of hearing but works as a book-keeper. Denies LOC, hitting head, headache or neck pain. Per EMS, patient was back to baseline and presented as a possible TIA. No history of TIAs. Patient currently is without complaint.  I evaluated the patient initially at 11:45 PM.    Past Medical History  Diagnosis Date  . Other specified personal history presenting hazards to health(V15.89)   . Personal history of arthritis   . Unspecified personal history presenting hazards to health   . Cramp of limb   . Acute myocardial infarction of other anterior wall, episode of care unspecified   . Personal history of malignant neoplasm of breast   . Unspecified essential hypertension   . Personal history of other diseases of digestive system   . Unspecified hypothyroidism   . Other and unspecified hyperlipidemia    Past Surgical History  Procedure Laterality Date  . Right thyroid lobectomy    . Cataract extraction      left-2006, right 2011  . Intrathoracic goiter resection    . Cholecystectomy    . Lumpectomy  for breast cancer  80's    left  . Back surgery     Family History  Problem Relation Age of Onset  . Colon cancer Mother   . Coronary artery disease Brother   . Breast cancer Mother    History  Substance Use Topics  . Smoking status: Never Smoker   . Smokeless tobacco: Never Used  . Alcohol Use: No   OB History    No data available     Review of Systems  Unable to perform ROS: Acuity of condition   Allergies  Review of patient's allergies indicates no known allergies.  Home Medications   Prior to Admission medications   Medication Sig Start Date End Date Taking? Authorizing Provider  aspirin EC 81 MG tablet Take 81 mg by mouth every morning.    Historical Provider, MD  furosemide (LASIX) 20 MG tablet Take 1 tablet (20 mg total) by mouth daily. 12/18/13   Aleksei Plotnikov V, MD  levothyroxine (SYNTHROID, LEVOTHROID) 25 MCG tablet Take 1 tablet by mouth  daily 12/25/14   Jacinta Shoe V, MD  lovastatin (MEVACOR) 20 MG tablet Take 1 tablet by mouth at  bedtime 12/25/14   Aleksei Plotnikov V, MD  metoprolol (LOPRESSOR) 50 MG tablet Take 1 tablet by mouth two  times daily 12/25/14   Aleksei Plotnikov V, MD   BP 196/76 mmHg  Pulse 74  Temp(Src) 98.2 F (  36.8 C) (Oral)  Resp 18  Ht 5\' 3"  (1.6 m)  Wt 109 lb (49.442 kg)  BMI 19.31 kg/m2  SpO2 98% Physical Exam  Constitutional: She is oriented to person, place, and time. No distress.  Elderly  HENT:  Head: Normocephalic and atraumatic.  Eyes: EOM are normal. Pupils are equal, round, and reactive to light.  Neck: Neck supple.  Cardiovascular: Normal rate, regular rhythm and normal heart sounds.   No murmur heard. Pulmonary/Chest: Effort normal and breath sounds normal. No respiratory distress. She has no wheezes.  Abdominal: Soft. Bowel sounds are normal. There is no tenderness. There is no rebound.  Neurological: She is alert and oriented to person, place, and time.  Patient able to name and repeat, reading is somewhat  garbled, cranial nerves II through XII intact, 5 out of 5 strength in all 4 extremities (biceps, triceps, grip bilaterally, hip flexion bilaterally, dorsi and plantar flexion bilaterally), subtle dysmetria with left finger to nose  Skin: Skin is warm and dry.  Psychiatric: She has a normal mood and affect.  Nursing note and vitals reviewed.   ED Course  Procedures (including critical care time) DIAGNOSTIC STUDIES: Oxygen Saturation is 98% on RA, normal by my interpretation.    COORDINATION OF CARE: 11:49 PM- Calling Dr. Thad Ranger of neurology   Labs Review Labs Reviewed  CBC - Abnormal; Notable for the following:    Hemoglobin 9.9 (*)    HCT 32.3 (*)    MCH 24.6 (*)    RDW 16.6 (*)    All other components within normal limits  COMPREHENSIVE METABOLIC PANEL - Abnormal; Notable for the following:    Glucose, Bld 155 (*)    ALT 12 (*)    All other components within normal limits  I-STAT CHEM 8, ED - Abnormal; Notable for the following:    Glucose, Bld 154 (*)    Hemoglobin 10.9 (*)    HCT 32.0 (*)    All other components within normal limits  PROTIME-INR  APTT  DIFFERENTIAL  ETHANOL  URINE RAPID DRUG SCREEN, HOSP PERFORMED  URINALYSIS, ROUTINE W REFLEX MICROSCOPIC (NOT AT [facility])  I-STAT TROPOININ, ED  CBG MONITORING, ED    Imaging Review No results found.   EKG Interpretation   Date/Time:  Saturday March 22 2015 23:24:24 EDT Ventricular Rate:  70 PR Interval:  176 QRS Duration: 93 QT Interval:  456 QTC Calculation: 492 R Axis:   29 Text Interpretation:  Sinus rhythm Borderline repolarization abnormality  Borderline prolonged QT interval Confirmed by HORTON  MD, COURTNEY ([id number])  on 03/23/2015 1:29:26 AM      MDM   Final diagnoses:  Stroke   Patient presents as a possible TIA. Onset of symptoms 9 PM. Is requested by nursing to evaluate the patient and she was noted to have some drift and dysmetria.  On physical exam, she appeared to have 5 out of 5 strength  in all 4 extremities, she did have subtle dysmetria on the left but no drift was noted. She was able to name and repeat. She appeared to be somewhat garbled with reading.  She is within window for TPA. Discussed with neurology given exam findings. Stroke labwork was obtained and an emergent CT was ordered. She will be emergently evaluated by neurology and decision for code stroke will be made.  12:59 AM Patient evaluated by Dr. Thad Ranger. Scoring 6 on her NIH stroke assessment.  For this reason, a code stroke was initiated and patient will be given  TPA.  I personally performed the services described in this documentation, which was scribed in my presence. The recorded information has been reviewed and is accurate.     Shon Baton, MD 03/23/15 0130

## 2015-03-22 NOTE — ED Notes (Signed)
Pt arrives from home via [facility] c/o transient weakness, AMS, L sided weakness.  EMS reports pt fell from chair, denies LOC, hitting head, tenderness or pain to head or neck.  Pt reports episode witnessed by family.  EMS reports all s/s resolved.  NASD noted at this time, pt AOx4.

## 2015-03-23 ENCOUNTER — Encounter (HOSPITAL_COMMUNITY): Payer: PRIVATE HEALTH INSURANCE

## 2015-03-23 ENCOUNTER — Inpatient Hospital Stay (HOSPITAL_COMMUNITY): Payer: Medicare Other

## 2015-03-23 DIAGNOSIS — S0990XA Unspecified injury of head, initial encounter: Secondary | ICD-10-CM | POA: Diagnosis not present

## 2015-03-23 DIAGNOSIS — D649 Anemia, unspecified: Secondary | ICD-10-CM | POA: Diagnosis present

## 2015-03-23 DIAGNOSIS — G8194 Hemiplegia, unspecified affecting left nondominant side: Secondary | ICD-10-CM | POA: Diagnosis present

## 2015-03-23 DIAGNOSIS — Z681 Body mass index (BMI) 19 or less, adult: Secondary | ICD-10-CM | POA: Diagnosis not present

## 2015-03-23 DIAGNOSIS — I69398 Other sequelae of cerebral infarction: Secondary | ICD-10-CM | POA: Diagnosis not present

## 2015-03-23 DIAGNOSIS — Z853 Personal history of malignant neoplasm of breast: Secondary | ICD-10-CM | POA: Diagnosis not present

## 2015-03-23 DIAGNOSIS — H53462 Homonymous bilateral field defects, left side: Secondary | ICD-10-CM | POA: Diagnosis not present

## 2015-03-23 DIAGNOSIS — I639 Cerebral infarction, unspecified: Secondary | ICD-10-CM | POA: Diagnosis not present

## 2015-03-23 DIAGNOSIS — I63311 Cerebral infarction due to thrombosis of right middle cerebral artery: Secondary | ICD-10-CM | POA: Diagnosis not present

## 2015-03-23 DIAGNOSIS — E876 Hypokalemia: Secondary | ICD-10-CM | POA: Diagnosis not present

## 2015-03-23 DIAGNOSIS — I2510 Atherosclerotic heart disease of native coronary artery without angina pectoris: Secondary | ICD-10-CM | POA: Diagnosis present

## 2015-03-23 DIAGNOSIS — E039 Hypothyroidism, unspecified: Secondary | ICD-10-CM | POA: Diagnosis present

## 2015-03-23 DIAGNOSIS — R414 Neurologic neglect syndrome: Secondary | ICD-10-CM | POA: Diagnosis not present

## 2015-03-23 DIAGNOSIS — R4189 Other symptoms and signs involving cognitive functions and awareness: Secondary | ICD-10-CM | POA: Diagnosis present

## 2015-03-23 DIAGNOSIS — R531 Weakness: Secondary | ICD-10-CM | POA: Diagnosis not present

## 2015-03-23 DIAGNOSIS — R4182 Altered mental status, unspecified: Secondary | ICD-10-CM | POA: Diagnosis not present

## 2015-03-23 DIAGNOSIS — I6340 Cerebral infarction due to embolism of unspecified cerebral artery: Secondary | ICD-10-CM | POA: Diagnosis not present

## 2015-03-23 DIAGNOSIS — H53469 Homonymous bilateral field defects, unspecified side: Secondary | ICD-10-CM | POA: Diagnosis not present

## 2015-03-23 DIAGNOSIS — R739 Hyperglycemia, unspecified: Secondary | ICD-10-CM | POA: Diagnosis present

## 2015-03-23 DIAGNOSIS — M11832 Other specified crystal arthropathies, left wrist: Secondary | ICD-10-CM | POA: Diagnosis not present

## 2015-03-23 DIAGNOSIS — I69354 Hemiplegia and hemiparesis following cerebral infarction affecting left non-dominant side: Secondary | ICD-10-CM | POA: Diagnosis not present

## 2015-03-23 DIAGNOSIS — G8190 Hemiplegia, unspecified affecting unspecified side: Secondary | ICD-10-CM | POA: Diagnosis not present

## 2015-03-23 DIAGNOSIS — I10 Essential (primary) hypertension: Secondary | ICD-10-CM | POA: Diagnosis present

## 2015-03-23 DIAGNOSIS — Z79899 Other long term (current) drug therapy: Secondary | ICD-10-CM | POA: Diagnosis not present

## 2015-03-23 DIAGNOSIS — M25432 Effusion, left wrist: Secondary | ICD-10-CM | POA: Diagnosis not present

## 2015-03-23 DIAGNOSIS — E785 Hyperlipidemia, unspecified: Secondary | ICD-10-CM | POA: Diagnosis present

## 2015-03-23 DIAGNOSIS — I63411 Cerebral infarction due to embolism of right middle cerebral artery: Secondary | ICD-10-CM | POA: Diagnosis present

## 2015-03-23 DIAGNOSIS — I6789 Other cerebrovascular disease: Secondary | ICD-10-CM | POA: Diagnosis not present

## 2015-03-23 DIAGNOSIS — I252 Old myocardial infarction: Secondary | ICD-10-CM | POA: Diagnosis not present

## 2015-03-23 DIAGNOSIS — Z7982 Long term (current) use of aspirin: Secondary | ICD-10-CM | POA: Diagnosis not present

## 2015-03-23 LAB — LIPID PANEL
Cholesterol: 152 mg/dL (ref 0–200)
HDL: 47 mg/dL (ref >40)
LDL Cholesterol: 94 mg/dL (ref 0–99)
TRIGLYCERIDES: 54 mg/dL (ref <150)
Total CHOL/HDL Ratio: 3.2 RATIO
VLDL: 11 mg/dL (ref 0–40)

## 2015-03-23 LAB — COMPREHENSIVE METABOLIC PANEL
ALK PHOS: 64 U/L (ref 38–126)
ALT: 12 U/L — AB (ref 14–54)
AST: 29 U/L (ref 15–41)
Albumin: 3.5 g/dL (ref 3.5–5.0)
Anion gap: 9 (ref 5–15)
BUN: 12 mg/dL (ref 6–20)
CALCIUM: 9.2 mg/dL (ref 8.9–10.3)
CO2: 24 mmol/L (ref 22–32)
CREATININE: 0.67 mg/dL (ref 0.44–1.00)
Chloride: 106 mmol/L (ref 101–111)
GFR calc non Af Amer: >60 mL/min (ref >60)
Glucose, Bld: 155 mg/dL — ABNORMAL HIGH (ref 65–99)
POTASSIUM: 3.8 mmol/L (ref 3.5–5.1)
Sodium: 139 mmol/L (ref 135–145)
TOTAL PROTEIN: 6.8 g/dL (ref 6.5–8.1)
Total Bilirubin: 0.5 mg/dL (ref 0.3–1.2)

## 2015-03-23 LAB — I-STAT CHEM 8, ED
BUN: 16 mg/dL (ref 6–20)
CALCIUM ION: 1.13 mmol/L (ref 1.13–1.30)
Chloride: 106 mmol/L (ref 101–111)
Creatinine, Ser: 0.6 mg/dL (ref 0.44–1.00)
GLUCOSE: 154 mg/dL — AB (ref 65–99)
HEMATOCRIT: 32 % — AB (ref 36.0–46.0)
Hemoglobin: 10.9 g/dL — ABNORMAL LOW (ref 12.0–15.0)
POTASSIUM: 3.7 mmol/L (ref 3.5–5.1)
SODIUM: 142 mmol/L (ref 135–145)
TCO2: 22 mmol/L (ref 0–100)

## 2015-03-23 LAB — DIFFERENTIAL
Basophils Absolute: 0 10*3/uL (ref 0.0–0.1)
Basophils Relative: 1 % (ref 0–1)
EOS ABS: 0.1 10*3/uL (ref 0.0–0.7)
EOS PCT: 2 % (ref 0–5)
Lymphocytes Relative: 24 % (ref 12–46)
Lymphs Abs: 1.4 10*3/uL (ref 0.7–4.0)
Monocytes Absolute: 0.6 10*3/uL (ref 0.1–1.0)
Monocytes Relative: 10 % (ref 3–12)
NEUTROS PCT: 63 % (ref 43–77)
Neutro Abs: 3.7 10*3/uL (ref 1.7–7.7)

## 2015-03-23 LAB — URINALYSIS, ROUTINE W REFLEX MICROSCOPIC
Bilirubin Urine: NEGATIVE
GLUCOSE, UA: NEGATIVE mg/dL
Ketones, ur: NEGATIVE mg/dL
Nitrite: NEGATIVE
PROTEIN: NEGATIVE mg/dL
Specific Gravity, Urine: 1.008 (ref 1.005–1.030)
Urobilinogen, UA: 1 mg/dL (ref 0.0–1.0)
pH: 6 (ref 5.0–8.0)

## 2015-03-23 LAB — RAPID URINE DRUG SCREEN, HOSP PERFORMED
AMPHETAMINES: NOT DETECTED
BARBITURATES: NOT DETECTED
Benzodiazepines: NOT DETECTED
Cocaine: NOT DETECTED
Opiates: NOT DETECTED
Tetrahydrocannabinol: NOT DETECTED

## 2015-03-23 LAB — CBC
HEMATOCRIT: 32.3 % — AB (ref 36.0–46.0)
Hemoglobin: 9.9 g/dL — ABNORMAL LOW (ref 12.0–15.0)
MCH: 24.6 pg — AB (ref 26.0–34.0)
MCHC: 30.7 g/dL (ref 30.0–36.0)
MCV: 80.3 fL (ref 78.0–100.0)
Platelets: 212 10*3/uL (ref 150–400)
RBC: 4.02 MIL/uL (ref 3.87–5.11)
RDW: 16.6 % — ABNORMAL HIGH (ref 11.5–15.5)
WBC: 5.9 10*3/uL (ref 4.0–10.5)

## 2015-03-23 LAB — I-STAT TROPONIN, ED: TROPONIN I, POC: 0.01 ng/mL (ref 0.00–0.08)

## 2015-03-23 LAB — PROTIME-INR
INR: 1.05 (ref 0.00–1.49)
Prothrombin Time: 13.9 seconds (ref 11.6–15.2)

## 2015-03-23 LAB — URINE MICROSCOPIC-ADD ON

## 2015-03-23 LAB — APTT: aPTT: 27 seconds (ref 24–37)

## 2015-03-23 LAB — ETHANOL

## 2015-03-23 LAB — MRSA PCR SCREENING: MRSA BY PCR: NEGATIVE

## 2015-03-23 MED ORDER — SODIUM CHLORIDE 0.9 % IV SOLN
50.0000 mL | Freq: Once | INTRAVENOUS | Status: AC
  Administered 2015-03-23: 50 mL via INTRAVENOUS

## 2015-03-23 MED ORDER — AMLODIPINE BESYLATE 2.5 MG PO TABS
2.5000 mg | ORAL_TABLET | Freq: Every day | ORAL | Status: DC
  Administered 2015-03-23 – 2015-03-26 (×4): 2.5 mg via ORAL
  Filled 2015-03-23 (×4): qty 1

## 2015-03-23 MED ORDER — ALTEPLASE (STROKE) FULL DOSE INFUSION
0.9000 mg/kg | Freq: Once | INTRAVENOUS | Status: AC
  Administered 2015-03-23: 44 mg via INTRAVENOUS
  Filled 2015-03-23: qty 44

## 2015-03-23 MED ORDER — METOPROLOL TARTRATE 50 MG PO TABS
50.0000 mg | ORAL_TABLET | Freq: Two times a day (BID) | ORAL | Status: DC
  Administered 2015-03-23 – 2015-03-26 (×6): 50 mg via ORAL
  Filled 2015-03-23 (×7): qty 1

## 2015-03-23 MED ORDER — SODIUM CHLORIDE 0.9 % IV SOLN
INTRAVENOUS | Status: DC
  Administered 2015-03-23 – 2015-03-25 (×2): via INTRAVENOUS

## 2015-03-23 MED ORDER — ACETAMINOPHEN 325 MG PO TABS
650.0000 mg | ORAL_TABLET | ORAL | Status: DC | PRN
  Administered 2015-03-25 – 2015-03-26 (×2): 650 mg via ORAL
  Filled 2015-03-23 (×2): qty 2

## 2015-03-23 MED ORDER — CLEVIDIPINE BUTYRATE 0.5 MG/ML IV EMUL
0.0000 mg/h | INTRAVENOUS | Status: DC
  Filled 2015-03-23: qty 50

## 2015-03-23 MED ORDER — LABETALOL HCL 5 MG/ML IV SOLN
10.0000 mg | INTRAVENOUS | Status: DC | PRN
Start: 2015-03-23 — End: 2015-03-26
  Administered 2015-03-23: 10 mg via INTRAVENOUS
  Filled 2015-03-23: qty 4

## 2015-03-23 MED ORDER — STROKE: EARLY STAGES OF RECOVERY BOOK
Freq: Once | Status: AC
  Administered 2015-03-23: 03:00:00
  Filled 2015-03-23: qty 1

## 2015-03-23 MED ORDER — ACETAMINOPHEN 650 MG RE SUPP: 650.0000 mg | RECTAL | Status: DC | PRN

## 2015-03-23 MED ORDER — SENNOSIDES-DOCUSATE SODIUM 8.6-50 MG PO TABS
1.0000 | ORAL_TABLET | Freq: Every evening | ORAL | Status: DC | PRN
  Filled 2015-03-23: qty 1

## 2015-03-23 MED ORDER — PANTOPRAZOLE SODIUM 40 MG IV SOLR
40.0000 mg | Freq: Every day | INTRAVENOUS | Status: DC
  Filled 2015-03-23: qty 40

## 2015-03-23 MED ORDER — PANTOPRAZOLE SODIUM 40 MG PO PACK
40.0000 mg | PACK | Freq: Every day | ORAL | Status: DC
  Administered 2015-03-23 – 2015-03-25 (×2): 40 mg via ORAL
  Filled 2015-03-23 (×4): qty 20

## 2015-03-23 NOTE — ED Notes (Signed)
Dr. Reynolds at bedside.

## 2015-03-23 NOTE — H&P (Signed)
Admission H&P    Chief Complaint: Left sided weakness  HPI: Leah Figueroa is an 81 y.o. female who was at home with family.  While speaking with family was noted to start to lean to the left.  The patient then attempted to stand and fell to the left.  She was seated but this happened again.  EMS was called at that time and the patient was brought in for evaluation.  Symptoms initially improved but after presentation patient again became weak on the left.  NIHSS of 6.  Code stroke called at that time.  Patient on ASA at home.   Patient remains functional.  Lives alone.  Drives.  Works as a Catering manager.     Date last known well: Date: 03/22/2015 Time last known well: Time: 22:00 tPA Given: Yes  MRankin: 0  Past Medical History  Diagnosis Date  . Other specified personal history presenting hazards to health(V15.89)   . Personal history of arthritis   . Unspecified personal history presenting hazards to health   . Cramp of limb   . Acute myocardial infarction of other anterior wall, episode of care unspecified   . Personal history of malignant neoplasm of breast   . Unspecified essential hypertension   . Personal history of other diseases of digestive system   . Unspecified hypothyroidism   . Other and unspecified hyperlipidemia     Past Surgical History  Procedure Laterality Date  . Right thyroid lobectomy    . Cataract extraction      left-2006, right 2011  . Intrathoracic goiter resection    . Cholecystectomy    . Lumpectomy for breast cancer  80's    left  . Back surgery      Family History  Problem Relation Age of Onset  . Colon cancer Mother   . Coronary artery disease Brother   . Breast cancer Mother    Social History:  reports that she has never smoked. She has never used smokeless tobacco. She reports that she does not drink alcohol or use illicit drugs.  Allergies: No Known Allergies  Medications: Prior to Admission medications   Medication Sig Start Date End  Date Taking? Authorizing Provider  aspirin EC 81 MG tablet Take 81 mg by mouth every morning.    Historical Provider, MD  furosemide (LASIX) 20 MG tablet Take 1 tablet (20 mg total) by mouth daily. 12/18/13   Tresa Garter, MD  levothyroxine (SYNTHROID, LEVOTHROID) 25 MCG tablet Take 1 tablet by mouth  daily 12/25/14   Tresa Garter, MD  lovastatin (MEVACOR) 20 MG tablet Take 1 tablet by mouth at  bedtime 12/25/14   Tresa Garter, MD  metoprolol (LOPRESSOR) 50 MG tablet Take 1 tablet by mouth two  times daily 12/25/14   Aleksei Plotnikov V, MD    ROS: History obtained from the patient  General ROS: negative for - chills, fatigue, fever, night sweats, weight gain or weight loss Psychological ROS: negative for - behavioral disorder, hallucinations, memory difficulties, mood swings or suicidal ideation Ophthalmic ROS: negative for - blurry vision, double vision, eye pain or loss of vision ENT ROS: negative for - epistaxis, nasal discharge, oral lesions, sore throat, tinnitus or vertigo Allergy and Immunology ROS: negative for - hives or itchy/watery eyes Hematological and Lymphatic ROS: negative for - bleeding problems, bruising or swollen lymph nodes Endocrine ROS: negative for - galactorrhea, hair pattern changes, polydipsia/polyuria or temperature intolerance Respiratory ROS: negative for - cough, hemoptysis, shortness of  breath or wheezing Cardiovascular ROS: negative for - chest pain, dyspnea on exertion, edema or irregular heartbeat Gastrointestinal ROS: negative for - abdominal pain, diarrhea, hematemesis, nausea/vomiting or stool incontinence Genito-Urinary ROS: negative for - dysuria, hematuria, incontinence or urinary frequency/urgency Musculoskeletal ROS: right leg cramping Neurological ROS: as noted in HPI Dermatological ROS: negative for rash and skin lesion changes  Physical Examination: Blood pressure 175/55, pulse 74, temperature 98.2 F (36.8 C), temperature  source Oral, resp. rate 18, height 5\' 3"  (1.6 m), weight 49.442 kg (109 lb), SpO2 99 %.  General Examination:  HEENT-  Normocephalic, no lesions, without obvious abnormality.  Normal external eye and conjunctiva.  Normal TM's bilaterally.  Normal auditory canals and external ears. Normal external nose, mucus membranes and septum.  Normal pharynx. Cardiovascular- S1, S2 normal, pulses palpable throughout   Lungs- chest clear, no wheezing, rales, normal symmetric air entry Abdomen- soft, non-tender; bowel sounds normal; no masses,  no organomegaly Extremities- no edema Lymph-no adenopathy palpable Musculoskeletal-no joint tenderness, deformity or swelling Skin-warm and dry, no hyperpigmentation, vitiligo, or suspicious lesions  Neurological Examination Mental Status: Alert, oriented, thought content appropriate.  Speech fluent without evidence of aphasia.  Able to follow 3 step commands without difficulty.  Left neglect. Cranial Nerves: II: Discs flat bilaterally; LHH, pupils equal, round, reactive to light and accommodation III,IV, VI: ptosis not present, extra-ocular motions intact bilaterally V,VII: smile symmetric, facial light touch sensation normal bilaterally VIII: hearing normal bilaterally IX,X: gag reflex present XI: bilateral shoulder shrug XII: midline tongue extension Motor: Right : Upper extremity   5/5    Left:     Upper extremity   4/5  Lower extremity   5/5     Lower extremity   4/5 Tone and bulk:normal tone throughout; no atrophy noted Sensory: Pinprick and light touch intact throughout, bilaterally Deep Tendon Reflexes: 2+ and symmetric with absent AJ's bilaterally Plantars: Right: upgoing   Left: upgoing Cerebellar: Dysmetria with finger-to-nose and heel-to-shin testing on the left.  Intact on the right Gait: not tested due to safety concerns    Laboratory Studies:   Basic Metabolic Panel:  Recent Labs Lab 03/23/15 0019 03/23/15 0026  NA 139 142  K 3.8  3.7  CL 106 106  CO2 24  --   GLUCOSE 155* 154*  BUN 12 16  CREATININE 0.67 0.60  CALCIUM 9.2  --     Liver Function Tests:  Recent Labs Lab 03/23/15 0019  AST 29  ALT 12*  ALKPHOS 64  BILITOT 0.5  PROT 6.8  ALBUMIN 3.5   No results for input(s): LIPASE, AMYLASE in the last 168 hours. No results for input(s): AMMONIA in the last 168 hours.  CBC:  Recent Labs Lab 03/23/15 0019 03/23/15 0026  WBC 5.9  --   NEUTROABS 3.7  --   HGB 9.9* 10.9*  HCT 32.3* 32.0*  MCV 80.3  --   PLT 212  --     Cardiac Enzymes: No results for input(s): CKTOTAL, CKMB, CKMBINDEX, TROPONINI in the last 168 hours.  BNP: Invalid input(s): POCBNP  CBG: No results for input(s): GLUCAP in the last 168 hours.  Microbiology: No results found for this or any previous visit.  Coagulation Studies:  Recent Labs  03/23/15 0019  LABPROT 13.9  INR 1.05    Urinalysis: No results for input(s): COLORURINE, LABSPEC, PHURINE, GLUCOSEU, HGBUR, BILIRUBINUR, KETONESUR, PROTEINUR, UROBILINOGEN, NITRITE, LEUKOCYTESUR in the last 168 hours.  Invalid input(s): APPERANCEUR  Lipid Panel:     Component  Value Date/Time   CHOL 159 07/24/2013 1123   TRIG 97.0 07/24/2013 1123   HDL 41.30 07/24/2013 1123   CHOLHDL 4 07/24/2013 1123   VLDL 19.4 07/24/2013 1123   LDLCALC 98 07/24/2013 1123    HgbA1C: No results found for: HGBA1C  Urine Drug Screen:  No results found for: LABOPIA, COCAINSCRNUR, LABBENZ, AMPHETMU, THCU, LABBARB  Alcohol Level:   Recent Labs Lab 03/23/15 0019  ETH <5    Other results: EKG: sinus rhythm at 70 bpm.  Imaging: No results found.  Assessment: 81 y.o. female presenting with acute onset left sided weakness, LHH and neglect.  Symptoms initially improved to the point that the patient was not a tPA candidate but patient had worsening and with NIHSS of 6, tPA was considered.  Head CT independently reviewed and showed small vessel ischemic changes but no acute changes.   Risks and benefits of tPA were discussed.  Contraindications were reviewed.  Verbal consent was obtained and tPA was administered.    Stroke Risk Factors - hyperlipidemia and hypertension  Plan: 1. HgbA1c, fasting lipid panel 2. MRI, MRA  of the brain without contrast 3. PT consult, OT consult, Speech consult 4. Echocardiogram 5. Carotid dopplers 6. Prophylactic therapy-None 7. NPO until RN stroke swallow screen 8. Telemetry monitoring 9. Frequent neuro checks 10. Repeat head CT in 24 hours 11. Admission to NICU   Case discussed with Dr. Wilkie Aye  This patient is critically ill and at significant risk of neurological worsening, death and care requires constant monitoring of vital signs, hemodynamics,respiratory and cardiac monitoring, neurological assessment, discussion with family, other specialists and medical decision making of high complexity. I spent 80 minutes of neurocritical care time  in the care of  this patient.  Thana Farr, MD [facility] [phone] 03/23/2015  1:37 AM

## 2015-03-23 NOTE — Evaluation (Signed)
Speech Language Pathology Evaluation Patient Details Name: MERELIN HUMAN MRN: [id number] DOB: [birth year] Today's Date: 03/23/2015 Time: 3159-4585 SLP Time Calculation (min) (ACUTE ONLY): 38 min  Problem List:  Patient Active Problem List   Diagnosis Date Noted  . Stroke 03/23/2015  . Chest wall trauma 01/11/2014  . Injury of left lower arm 01/11/2014  . Headache(784.0) 01/04/2013  . Allergic rhinitis, cause unspecified 01/04/2013  . Anemia 04/19/2012  . Epistaxis, recurrent 04/19/2012  . HYPOTHYROIDISM 01/01/2008  . HYPERLIPIDEMIA 01/01/2008  . HYPERTENSION 01/01/2008  . MYOCARDIAL INFARCTION, ACUTE, ANTERIOR WALL 01/01/2008  . LEG CRAMPS 01/01/2008  . ADENOCARCINOMA, BREAST, HX OF 01/01/2008  . DIVERTICULOSIS, COLON, HX OF 01/01/2008  . ARTHRITIS, HX OF 01/01/2008  . RADIATION THERAPY, HX OF 01/01/2008  . CATARACT EXTRACTION, HX OF 01/01/2008  . CHOLECYSTECTOMY, LAPAROSCOPIC, HX OF 01/01/2008   Past Medical History:  Past Medical History  Diagnosis Date  . Other specified personal history presenting hazards to health(V15.89)   . Personal history of arthritis   . Unspecified personal history presenting hazards to health   . Cramp of limb   . Acute myocardial infarction of other anterior wall, episode of care unspecified   . Personal history of malignant neoplasm of breast   . Unspecified essential hypertension   . Personal history of other diseases of digestive system   . Unspecified hypothyroidism   . Other and unspecified hyperlipidemia    Past Surgical History:  Past Surgical History  Procedure Laterality Date  . Right thyroid lobectomy    . Cataract extraction      left-2006, right 2011  . Intrathoracic goiter resection    . Cholecystectomy    . Lumpectomy for breast cancer  80's    left  . Back surgery     HPI:  81 y.o. female presenting with acute onset left sided weakness, LHH and neglect. Symptoms initially improved to the point that the patient was not a  tPA candidate but patient had worsening and with NIHSS of 6, tPA was considered. Head CT independently reviewed and showed small vessel ischemic changes but no acute changes.Pt administered tPA. Pt falied Rn stroke swallow due to a history of dysphagia. Pt had OP MBS on 05/10/14 showing mild oropharyngeal dysphagia without aspiratin or penetration, including slight delay in initiation, mild residuals. Residuals improved with chin tuck. Pt also found to have esopahgeal dysmotility during esophagram.    Assessment / Plan / Recommendation Clinical Impression  Pts primary deficit is left neglect and poor awareness of new functional deficits. Pt can sometimes become confused with complex verbal instruction. Cognitive linguistic function otherwise appears quite good with pt able to recall recent events and even some recall of recommendations. Will f/u for further treatment of cognition. Recommend CIR at d/c.     SLP Assessment  Patient needs continued Speech Lanaguage Pathology Services    Follow Up Recommendations  Inpatient Rehab    Frequency and Duration min 2x/week  2 weeks   Pertinent Vitals/Pain     SLP Goals  Progression toward goals: Goals met, education completed, patient discharged from SLP  SLP Evaluation Prior Functioning  Cognitive/Linguistic Baseline: Within functional limits Type of Home: House  Lives With: Alone Available Help at Discharge: Family Vocation: Part time employment   Cognition  Overall Cognitive Status: Impaired/Different from baseline Arousal/Alertness: Awake/alert Orientation Level: Oriented X4 Attention: Sustained Sustained Attention: Appears intact Memory: Impaired Memory Impairment: Prospective memory Awareness: Impaired Awareness Impairment: Intellectual impairment;Emergent impairment;Anticipatory impairment Problem Solving: Impaired Problem Solving  Impairment: Functional complex Safety/Judgment: Impaired    Comprehension  Auditory  Comprehension Overall Auditory Comprehension: Appears within functional limits for tasks assessed    Expression Verbal Expression Overall Verbal Expression: Appears within functional limits for tasks assessed   Oral / Motor Oral Motor/Sensory Function Overall Oral Motor/Sensory Function: Appears within functional limits for tasks assessed Motor Speech Overall Motor Speech: Appears within functional limits for tasks assessed   GO    Herbie Baltimore, MA CCC-SLP [phone]  Lynann Beaver 03/23/2015, 1:46 PM

## 2015-03-23 NOTE — Progress Notes (Signed)
STROKE TEAM PROGRESS NOTE   HISTORY Leah Figueroa is a 81 y.o. female who was at home with family. While speaking with family was noted to start to lean to the left. The patient then attempted to stand and fell to the left. She was seated but this happened again. EMS was called at that time and the patient was brought in for evaluation. Symptoms initially improved but after presentation patient again became weak on the left. NIHSS of 6. Code stroke called at that time. Patient on ASA at home.  Patient remains functional. Lives alone. Drives. Works as a Catering manager.   Date last known well: Date: 03/22/2015 Time last known well: Time: 22:00 tPA Given: Yes   SUBJECTIVE (INTERVAL HISTORY) No family members present. The patient is mildly disoriented. She is able to follow commands. She has left visual field deficits, left hemiparesis, left hemisensory deficits and mild left neglect.   OBJECTIVE Temp:  [97.9 F (36.6 C)-98.3 F (36.8 C)] 98.3 F (36.8 C) (08/07 0735) Pulse Rate:  [62-84] 72 (08/07 0730) Cardiac Rhythm:  [-]  Resp:  [14-28] 20 (08/07 0730) BP: (112-206)/(46-148) 154/68 mmHg (08/07 0730) SpO2:  [93 %-100 %] 97 % (08/07 0730) Weight:  [49.442 kg (109 lb)] 49.442 kg (109 lb) (08/06 2318)  No results for input(s): GLUCAP in the last 168 hours.  Recent Labs Lab 03/23/15 0019 03/23/15 0026  NA 139 142  K 3.8 3.7  CL 106 106  CO2 24  --   GLUCOSE 155* 154*  BUN 12 16  CREATININE 0.67 0.60  CALCIUM 9.2  --     Recent Labs Lab 03/23/15 0019  AST 29  ALT 12*  ALKPHOS 64  BILITOT 0.5  PROT 6.8  ALBUMIN 3.5    Recent Labs Lab 03/23/15 0019 03/23/15 0026  WBC 5.9  --   NEUTROABS 3.7  --   HGB 9.9* 10.9*  HCT 32.3* 32.0*  MCV 80.3  --   PLT 212  --    No results for input(s): CKTOTAL, CKMB, CKMBINDEX, TROPONINI in the last 168 hours.  Recent Labs  03/23/15 0019  LABPROT 13.9  INR 1.05    Recent Labs  03/23/15 0257  COLORURINE  YELLOW  LABSPEC 1.008  PHURINE 6.0  GLUCOSEU NEGATIVE  HGBUR SMALL*  BILIRUBINUR NEGATIVE  KETONESUR NEGATIVE  PROTEINUR NEGATIVE  UROBILINOGEN 1.0  NITRITE NEGATIVE  LEUKOCYTESUR SMALL*       Component Value Date/Time   CHOL 152 03/23/2015 0628   TRIG 54 03/23/2015 0628   HDL 47 03/23/2015 0628   CHOLHDL 3.2 03/23/2015 0628   VLDL 11 03/23/2015 0628   LDLCALC 94 03/23/2015 0628   No results found for: HGBA1C    Component Value Date/Time   LABOPIA NONE DETECTED 03/23/2015 0257   COCAINSCRNUR NONE DETECTED 03/23/2015 0257   LABBENZ NONE DETECTED 03/23/2015 0257   AMPHETMU NONE DETECTED 03/23/2015 0257   THCU NONE DETECTED 03/23/2015 0257   LABBARB NONE DETECTED 03/23/2015 0257     Recent Labs Lab 03/23/15 0019  ETH <5   Imaging   CT of the head without contrast - pending  MRI / MRA of the head - pending    PHYSICAL EXAM Frail elderly caucasian lady not in distress. . Afebrile. Head is nontraumatic. Neck is supple without bruit.    Cardiac exam no murmur or gallop. Lungs are clear to auscultation. Distal pulses are well felt.  Neurological Exam :  Awake alert oriented to time place and person. Speech  is clear without dysarthria or aphasia. Right gaze preference but able to look to the left all the way. Pupils equal reactive. Fundi were not visualized. Dense left homonymous hemianopsia. No facial weakness. Tongue midline. Motor system exam revealed mild left upper extremity drift with significant weakness of left grip, wrist and intrinsic hand muscles. 3/5 proximal strength in the left upper extremity. No lower extremity drift on the left but mild weakness of left hip flexor and ankle dorsiflexors. Normal strength on the right. Mild diminished left hemibody sensation. Mild left hemi-inattention. Able to recognize left side of her body. The tendon reflexes are normal on the right depressed on the left. Right plantar downgoing left is equivocal. Mild impaired left  finger-to-nose coordination but knee to heel coordination is intact. Gait was not tested. NIH stroke scale score 6   ASSESSMENT/PLAN [name] is a 81 y.o. female with history of coronary artery disease with previous MI, hypertension, and hyperlipidemia presenting with left hemiparesis. She did receive IV t-PA 44 mg on 03/23/2015 at 0100 hrs.  Stroke:  Non-dominant  Right MCA infarct - etiology to be determined  Resultant  left hemiparesis, left hemisensory deficit, left visual field deficits.  MRI  pending  MRA  pending  Carotid Doppler  pending  2D Echo  pending  LDL 94  HgbA1c pending  SCDs for VTE prophylaxis  Diet NPO time specified  aspirin 81 mg orally every day prior to admission, now on no antithrombotic secondary to TPA therapy.  Patient counseled to be compliant with her antithrombotic medications  Ongoing aggressive stroke risk factor management  Therapy recommendations: Pending  Disposition:  Pending  Hypertension  Home meds: Metoprolol  Stable  Patient counseled to be compliant with her blood pressure medications  Hyperlipidemia  Home meds:  Mevacor 20 mg daily resumed in hospital  LDL 94, goal < 70  Increase Mevacor to 40 mg daily point cleared for PO medications  Continue statin at discharge  Diabetes  HgbA1c pending, goal < 7.0  No history of diabetes  Other Stroke Risk Factors  Advanced age  Coronary artery disease   Other Active Problems  NPO  Anemia  Elevated glucose levels    Other Pertinent History    Hospital day # 0  Delton See PA-C [facility] Pager [phone] 03/23/2015, 12:02 PM I have personally examined this patient, reviewed notes, independently viewed imaging studies, participated in medical decision making and plan of care. I have made any additions or clarifications directly to the above note. Agree with note above. She presented with left hemiplegia, neglecting gaze loss  likely secondary to a right MCA infarct. She remains at risk for neurological worsening, recurrent stroke, TIA needs ongoing stroke evaluation and close neurological monitoring. She has received IV tPA with slight improvement and no obvious complication This patient is critically ill and at significant risk of neurological worsening, death and care requires constant monitoring of vital signs, hemodynamics,respiratory and cardiac monitoring, extensive review of multiple databases, frequent neurological assessment, discussion with family, other specialists and medical decision making of high complexity.I have made any additions or clarifications directly to the above note.This critical care time does not reflect procedure time, or teaching time or supervisory time of PA/NP/Med Resident etc but could involve care discussion time.  I spent 34 minutes of neurocritical care time  in the care of  this patient.   Delia Heady, MD Medical Director [facility] Pager: [phone] 03/23/2015 12:02 PM  To contact Stroke Continuity provider, please refer to [email or web address] After hours, contact General Neurology

## 2015-03-23 NOTE — Progress Notes (Signed)
This note also relates to the following rows which could not be included: Pulse Rate - Cannot attach notes to unvalidated device data ECG Heart Rate - Cannot attach notes to unvalidated device data BP - Cannot attach notes to unvalidated device data Resp - Cannot attach notes to unvalidated device data SpO2 - Cannot attach notes to unvalidated device data   Labatolol given IV for elevated BP

## 2015-03-23 NOTE — Evaluation (Signed)
Clinical/Bedside Swallow Evaluation Patient Details  Name: Leah Figueroa MRN: [id number] Date of Birth: [birth year]  Today's Date: 03/23/2015 Time: SLP Start Time (ACUTE ONLY): 1015 SLP Stop Time (ACUTE ONLY): 1053 SLP Time Calculation (min) (ACUTE ONLY): 38 min  Past Medical History:  Past Medical History  Diagnosis Date  . Other specified personal history presenting hazards to health(V15.89)   . Personal history of arthritis   . Unspecified personal history presenting hazards to health   . Cramp of limb   . Acute myocardial infarction of other anterior wall, episode of care unspecified   . Personal history of malignant neoplasm of breast   . Unspecified essential hypertension   . Personal history of other diseases of digestive system   . Unspecified hypothyroidism   . Other and unspecified hyperlipidemia    Past Surgical History:  Past Surgical History  Procedure Laterality Date  . Right thyroid lobectomy    . Cataract extraction      left-2006, right 2011  . Intrathoracic goiter resection    . Cholecystectomy    . Lumpectomy for breast cancer  80's    left  . Back surgery     HPI:  81 y.o. female presenting with acute onset left sided weakness, LHH and neglect. Symptoms initially improved to the point that the patient was not a tPA candidate but patient had worsening and with NIHSS of 6, tPA was considered. Head CT independently reviewed and showed small vessel ischemic changes but no acute changes.Pt administered tPA. Pt falied Rn stroke swallow due to a history of dysphagia. Pt had OP MBS on 05/10/14 showing mild oropharyngeal dysphagia without aspiratin or penetration, including slight delay in initiation, mild residuals. Residuals improved with chin tuck. Pt also found to have esopahgeal dysmotility during esophagram.    Assessment / Plan / Recommendation Clinical Impression  Pt demonstrates stable swallow function with no appearance of acute impairment of swallowing. Pt  does have a mild baseline dysphagia dx on MBS last year. Pt does continue to use chin tuck strategy and reports it helps. Recommend pt resume a regular diet and thin liquids with a chin tuck. No SLP f/u needed for swallowing. See cognitive linguistic eval for further recommendations.     Aspiration Risk  Mild    Diet Recommendation Age appropriate regular solids;Thin   Medication Administration: Whole meds with liquid Compensations: Slow rate;Small sips/bites;Chin tuck    Other  Recommendations Oral Care Recommendations: Oral care BID   Follow Up Recommendations       Frequency and Duration        Pertinent Vitals/Pain NA    SLP Swallow Goals     Swallow Study Prior Functional Status       General Other Pertinent Information: 81 y.o. female presenting with acute onset left sided weakness, LHH and neglect. Symptoms initially improved to the point that the patient was not a tPA candidate but patient had worsening and with NIHSS of 6, tPA was considered. Head CT independently reviewed and showed small vessel ischemic changes but no acute changes.Pt administered tPA. Pt falied Rn stroke swallow due to a history of dysphagia. Pt had OP MBS on 05/10/14 showing mild oropharyngeal dysphagia without aspiratin or penetration, including slight delay in initiation, mild residuals. Residuals improved with chin tuck. Pt also found to have esopahgeal dysmotility during esophagram.  Type of Study: Bedside swallow evaluation Previous Swallow Assessment: see HPI Diet Prior to this Study: NPO Temperature Spikes Noted: No History of Recent Intubation: No  Behavior/Cognition: Alert;Cooperative;Pleasant mood;Requires cueing Oral Cavity - Dentition: Other (Comment) (dentures) Web-Feeding Abilities: Able to feed [name] Patient Positioning: Postural control interferes with function Baseline Vocal Quality: Normal Volitional Cough: Strong Volitional Swallow: Able to elicit    Oral/Motor/Sensory  Function Overall Oral Motor/Sensory Function: Appears within functional limits for tasks assessed   Ice Chips     Thin Liquid Thin Liquid: Impaired Presentation: Straw;Edmonds Fed;Cup Pharyngeal  Phase Impairments: Cough - Delayed    Nectar Thick Nectar Thick Liquid: Not tested   Honey Thick Honey Thick Liquid: Not tested   Puree Puree: Within functional limits   Solid   GO    Solid: Within functional limits      Harlon Ditty, MA CCC-SLP [phone]  Damia Bobrowski, Riley Nearing 03/23/2015,10:58 AM

## 2015-03-23 NOTE — ED Notes (Signed)
Upon completing stroke scale this RN made Dr. Wilkie Aye and the charge RN aware that pt was possible code stroke.  Dr. Wilkie Aye came to bedside, stated not code stroke but would consult with Dr. Thad Ranger.  Order for CT placed.

## 2015-03-23 NOTE — ED Notes (Signed)
carelink contacted to page Code Stroke

## 2015-03-24 ENCOUNTER — Inpatient Hospital Stay (HOSPITAL_BASED_OUTPATIENT_CLINIC_OR_DEPARTMENT_OTHER): Payer: Medicare Other

## 2015-03-24 ENCOUNTER — Inpatient Hospital Stay (HOSPITAL_COMMUNITY): Payer: Medicare Other

## 2015-03-24 DIAGNOSIS — I639 Cerebral infarction, unspecified: Secondary | ICD-10-CM | POA: Diagnosis not present

## 2015-03-24 DIAGNOSIS — G8190 Hemiplegia, unspecified affecting unspecified side: Secondary | ICD-10-CM

## 2015-03-24 DIAGNOSIS — I6789 Other cerebrovascular disease: Secondary | ICD-10-CM

## 2015-03-24 DIAGNOSIS — H53462 Homonymous bilateral field defects, left side: Secondary | ICD-10-CM

## 2015-03-24 DIAGNOSIS — G8194 Hemiplegia, unspecified affecting left nondominant side: Secondary | ICD-10-CM | POA: Diagnosis present

## 2015-03-24 DIAGNOSIS — R414 Neurologic neglect syndrome: Secondary | ICD-10-CM

## 2015-03-24 DIAGNOSIS — H53469 Homonymous bilateral field defects, unspecified side: Secondary | ICD-10-CM | POA: Diagnosis present

## 2015-03-24 DIAGNOSIS — I6340 Cerebral infarction due to embolism of unspecified cerebral artery: Secondary | ICD-10-CM

## 2015-03-24 LAB — HEMOGLOBIN A1C
Hgb A1c MFr Bld: 5.8 % — ABNORMAL HIGH (ref 4.8–5.6)
Mean Plasma Glucose: 120 mg/dL

## 2015-03-24 MED ORDER — ENOXAPARIN SODIUM 40 MG/0.4ML ~~LOC~~ SOLN
40.0000 mg | SUBCUTANEOUS | Status: DC
  Administered 2015-03-24 – 2015-03-26 (×3): 40 mg via SUBCUTANEOUS
  Filled 2015-03-24 (×3): qty 0.4

## 2015-03-24 MED ORDER — AMLODIPINE BESYLATE 2.5 MG PO TABS: 2.5000 mg | ORAL_TABLET | Freq: Every day | ORAL | Status: DC

## 2015-03-24 MED ORDER — ASPIRIN EC 81 MG PO TBEC
81.0000 mg | DELAYED_RELEASE_TABLET | Freq: Every morning | ORAL | Status: DC
  Administered 2015-03-25 – 2015-03-26 (×2): 81 mg via ORAL
  Filled 2015-03-24 (×2): qty 1

## 2015-03-24 NOTE — Care Management Note (Signed)
Case Management Note  Patient Details  Name: Leah Figueroa MRN: [id number] Date of Birth: 09-26-1922  Subjective/Objective:    Pt admitted on 03/22/15 with stroke s/p TPA.  PTA, pt independent, lives alone.                  Action/Plan: PT/OT recommending CIR prior to dc home, and rehab consult in progress.  Will follow.    Expected Discharge Date:                  Expected Discharge Plan:  IP Rehab Facility  In-House Referral:  Clinical Social Work  Discharge planning Services  CM Consult  Post Acute Care Choice:    Choice offered to:     DME Arranged:    DME Agency:     HH Arranged:    HH Agency:     Status of Service:  In process, will continue to follow  Medicare Important Message Given:    Date Medicare IM Given:    Medicare IM give by:    Date Additional Medicare IM Given:    Additional Medicare Important Message give by:     If discussed at Long Length of Stay Meetings, dates discussed:    Additional Comments:  Quintella Baton, RN, BSN  Trauma/Neuro ICU Case Manager [phone]

## 2015-03-24 NOTE — Progress Notes (Signed)
STROKE TEAM PROGRESS NOTE   HISTORY Leah Figueroa is a 81 y.o. female who was at home with family. While speaking with family was noted to start to lean to the left. The patient then attempted to stand and fell to the left. She was seated but this happened again. EMS was called at that time and the patient was brought in for evaluation. Symptoms initially improved but after presentation patient again became weak on the left. NIHSS of 6. Code stroke called at that time. Patient on ASA at home.  Patient remains functional. Lives alone. Drives. Works as a Catering manager.   Date last known well: Date: 03/22/2015 Time last known well: Time: 22:00 tPA Given: Yes   SUBJECTIVE (INTERVAL HISTORY) No family members present. The patient is sitting up in bedside chair. She is able to follow commands. She has left visual field deficits, left hemiparesis, left hemisensory deficits and mild left neglect.   OBJECTIVE Temp:  [97.7 F (36.5 C)-99.1 F (37.3 C)] 97.9 F (36.6 C) (08/08 1458) Pulse Rate:  [57-128] 57 (08/08 1458) Cardiac Rhythm:  [-] Normal sinus rhythm (08/08 1200) Resp:  [17-28] 21 (08/08 1458) BP: (124-187)/(52-81) 146/64 mmHg (08/08 1458) SpO2:  [92 %-99 %] 99 % (08/08 1458)  No results for input(s): GLUCAP in the last 168 hours.  Recent Labs Lab 03/23/15 0019 03/23/15 0026  NA 139 142  K 3.8 3.7  CL 106 106  CO2 24  --   GLUCOSE 155* 154*  BUN 12 16  CREATININE 0.67 0.60  CALCIUM 9.2  --     Recent Labs Lab 03/23/15 0019  AST 29  ALT 12*  ALKPHOS 64  BILITOT 0.5  PROT 6.8  ALBUMIN 3.5    Recent Labs Lab 03/23/15 0019 03/23/15 0026  WBC 5.9  --   NEUTROABS 3.7  --   HGB 9.9* 10.9*  HCT 32.3* 32.0*  MCV 80.3  --   PLT 212  --    No results for input(s): CKTOTAL, CKMB, CKMBINDEX, TROPONINI in the last 168 hours.  Recent Labs  03/23/15 0019  LABPROT 13.9  INR 1.05    Recent Labs  03/23/15 0257  COLORURINE YELLOW  LABSPEC 1.008   PHURINE 6.0  GLUCOSEU NEGATIVE  HGBUR SMALL*  BILIRUBINUR NEGATIVE  KETONESUR NEGATIVE  PROTEINUR NEGATIVE  UROBILINOGEN 1.0  NITRITE NEGATIVE  LEUKOCYTESUR SMALL*       Component Value Date/Time   CHOL 152 03/23/2015 0628   TRIG 54 03/23/2015 0628   HDL 47 03/23/2015 0628   CHOLHDL 3.2 03/23/2015 0628   VLDL 11 03/23/2015 0628   LDLCALC 94 03/23/2015 0628   Lab Results  Component Value Date   HGBA1C 5.8* 03/23/2015      Component Value Date/Time   LABOPIA NONE DETECTED 03/23/2015 0257   COCAINSCRNUR NONE DETECTED 03/23/2015 0257   LABBENZ NONE DETECTED 03/23/2015 0257   AMPHETMU NONE DETECTED 03/23/2015 0257   THCU NONE DETECTED 03/23/2015 0257   LABBARB NONE DETECTED 03/23/2015 0257     Recent Labs Lab 03/23/15 0019  ETH <5   Imaging   CT of the head without contrast - 1. No acute intracranial pathology seen on CT. 2. Moderate cortical volume loss and diffuse small vessel ischemic microangiopathy  MRI  of the head - Acute RIGHT frontoparietal lobe infarct, MCA territory. Subcentimeter acute RIGHT pontine infarct..Severe white matter changes compatible with chronic small vessel ischemic disease. Scattered susceptibility artifacts suggest sequelae of chronic hypertension without lobar hematoma MRA HEAD: No  acute large vessel occlusion. dolicoectatic intracranial vessels compatible chronic hypertension with superimposed moderate luminal irregularity commonly seen with atherosclerosis.Focal high-grade stenosis LEFT A2 segment.    PHYSICAL EXAM Frail elderly caucasian lady not in distress. . Afebrile. Head is nontraumatic. Neck is supple without bruit.    Cardiac exam no murmur or gallop. Lungs are clear to auscultation. Distal pulses are well felt.  Neurological Exam :  Awake alert oriented to time place and person. Speech is clear without dysarthria or aphasia. Right gaze preference but able to look to the left all the way. Pupils equal reactive. Fundi  were not visualized. Dense left homonymous hemianopsia. No facial weakness. Tongue midline. Motor system exam revealed mild left upper extremity drift with significant weakness of left grip, wrist and intrinsic hand muscles. 3/5 proximal strength in the left upper extremity. No lower extremity drift on the left but mild weakness of left hip flexor and ankle dorsiflexors. Normal strength on the right. Mild diminished left hemibody sensation. Mild left hemi-inattention. Able to recognize left side of her body. The tendon reflexes are normal on the right depressed on the left. Right plantar downgoing left is equivocal. Mild impaired left finger-to-nose coordination but knee to heel coordination is intact. Gait was not tested. NIH stroke scale score 6   ASSESSMENT/PLAN Ms. Leah Figueroa is a 81 y.o. female with history of coronary artery disease with previous MI, hypertension, and hyperlipidemia presenting with left hemiparesis. She did receive IV t-PA 44 mg on 03/23/2015 at 0100 hrs.  Stroke:  Non-dominant  Right MCA infarct - etiology to be determined  Resultant  left hemiparesis, left hemisensory deficit, left visual field deficits. MRI  Acute RIGHT frontoparietal lobe infarct, MCA territory.  Subcentimeter acute RIGHT pontine infarct..S  MRA   No acute large vessel occlusion.  Carotid Doppler  pending  2D Echo  Left ventricle: The cavity size was normal. There was moderate concentric hypertrophy. Systolic function was normal. The estimated ejection fraction was in the range of 60% to 65%. There is akinesis of the apical myocardium.  LDL 94  HgbA1c 5.8%  SCDs for VTE prophylaxis Diet regular Room service appropriate?: Yes; Fluid consistency:: Thin  aspirin 81 mg orally every day prior to admission, now on no antithrombotic secondary to TPA therapy.  Patient counseled to be compliant with her antithrombotic medications  Ongoing aggressive stroke risk factor management  Therapy  recommendations: Pending  Disposition:  Pending  Hypertension  Home meds: Metoprolol  Stable  Patient counseled to be compliant with her blood pressure medications  Hyperlipidemia  Home meds:  Mevacor 20 mg daily resumed in hospital  LDL 94, goal < 70  Increase Mevacor to 40 mg daily point cleared for PO medications  Continue statin at discharge  Diabetes  HgbA1c 5.8, goal < 7.0  No history of diabetes  Other Stroke Risk Factors  Advanced age  Coronary artery disease   Other Active Problems  NPO  Anemia  Elevated glucose levels    Other Pertinent History    Hospital day # 1    I have personally examined this patient, reviewed notes, independently viewed imaging studies, participated in medical decision making and plan of care. I have made any additions or clarifications directly to the above note. Agree with note above. She presented with left hemiplegia, neglecting gaze loss likely secondary to a right MCA infarct. She remains at risk for neurological worsening, recurrent stroke, TIA needs ongoing stroke evaluation and close neurological monitoring. She has received IV  tPA with slight improvement and no obvious complication  Plan to transfer her out of the ICU to the regular unit today. Get physical occupational therapy consults. She'll likely need inpatient rehabilitation transfer over the next few days. Continue aspirin for stroke prevention for now.   Delia Heady, MD Medical Director [facility] Pager: [phone] 03/24/2015 3:54 PM     To contact Stroke Continuity provider, please refer to [email or web address]. After hours, contact General Neurology

## 2015-03-24 NOTE — Progress Notes (Signed)
Preliminary report by tech - Carotid Duplex Completed. No evidence of a significant stenosis noted in bilateral ICAs 1-39%. Bilateral vertebral arteries demonstrate normal antegrade flow. Marilynne Halsted, BS, RDMS, RVT

## 2015-03-24 NOTE — Consult Note (Signed)
Physical Medicine and Rehabilitation Consult  Reason for Consult: Left sided weakness with sensory deficits, left visual field deficits with left neglect. Referring Physician: Dr. Pearlean Brownie.    HPI: Leah Figueroa is a 81 y.o. female with history of HTN, HA, who was at home with family when she started to lean to the left then fell with attempts at waking. She was evaluated in ED on 08/06 and developed left sided weakness after presentation and received t-PA. MRI/MRA brain done revealing  Right MCA infarct affecting frontoparietal lobe, subcentimeter right pontine infarct, severe white mater changes due to SVD and focal high grade stenosis left A2 segment. 2D echo with EF 60-65% with apical akinesis, moderate concentric LVH and moderately calcified MV.  Carotid dopplers without significant ICA stenosis. Dr. Pearlean Brownie consulted and work up ongoing to determine etiology of R-MCA infarct.  ST evaluation revealed confusion with complex verbal instructions and poor awareness of deficits. Patient with resultant left sided weakness with sensory deficits. Left visual field deficits with left inattention as well as mild cognitive deficits. PT/OT evaluations done and CIR recommended by MD and Rehab team.    Review of Systems  HENT: Positive for hearing loss.   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  Respiratory: Negative for cough and shortness of breath.   Cardiovascular: Negative for chest pain and palpitations.  Gastrointestinal: Negative for heartburn, abdominal pain and constipation.  Genitourinary: Negative for dysuria.  Musculoskeletal: Positive for back pain.  Skin: Negative for itching and rash.  Neurological: Positive for sensory change and focal weakness. Negative for dizziness, tingling and headaches.  Psychiatric/Behavioral: Negative for depression.      Past Medical History  Diagnosis Date  . Other specified personal history presenting hazards to  health(V15.89)   . Personal history of arthritis   . Unspecified personal history presenting hazards to health   . Cramp of limb   . Acute myocardial infarction of other anterior wall, episode of care unspecified   . Personal history of malignant neoplasm of breast   . Unspecified essential hypertension   . Personal history of other diseases of digestive system   . Unspecified hypothyroidism   . Other and unspecified hyperlipidemia     Past Surgical History  Procedure Laterality Date  . Right thyroid lobectomy    . Cataract extraction      left-2006, right 2011  . Intrathoracic goiter resection    . Cholecystectomy    . Lumpectomy for breast cancer  80's    left  . Back surgery      Family History  Problem Relation Age of Onset  . Colon cancer Mother   . Coronary artery disease Brother   . Breast cancer Mother     Social History:   Lives alone. Works as a Catering manager. She reports that she has never smoked. She has never used smokeless tobacco. She reports that she does not drink alcohol or use illicit drugs.    Allergies: No Known Allergies    Medications Prior to Admission  Medication Sig Dispense Refill  . amLODipine (NORVASC) 2.5 MG tablet Take 2.5 mg by mouth daily.    Marland Kitchen aspirin EC 81 MG tablet Take 81 mg by mouth every morning.    Marland Kitchen ibuprofen (ADVIL,MOTRIN) 200 MG tablet Take 200 mg by mouth 2 (two) times daily as needed for moderate pain.    Marland Kitchen levothyroxine (SYNTHROID, LEVOTHROID) 25 MCG tablet Take 1 tablet  by mouth  daily 90 tablet 0  . metoprolol (LOPRESSOR) 50 MG tablet Take 1 tablet by mouth two  times daily 180 tablet 0  . furosemide (LASIX) 20 MG tablet Take 1 tablet (20 mg total) by mouth daily. (Patient not taking: Reported on 03/23/2015) 90 tablet 3  . 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 Status:  Mobility: Bed Mobility Overal bed mobility: Needs Assistance Bed Mobility: Supine to Sit Supine to sit: Min assist General bed mobility comments: with mod VCs for sequencing and to find upper bed rail to her left to help her get to EOB Transfers Overall transfer level: Needs assistance Equipment used: 2 person hand held assist (for ambulation) Transfers: Sit to/from Stand Sit to Stand: Min assist General transfer comment: Min assist for stability with Cues for safety and attention to task Ambulation/Gait Ambulation/Gait assistance: Mod assist Ambulation Distance (Feet): 18 Feet Assistive device: 1 person hand held assist Gait Pattern/deviations: Step-to pattern, Decreased step length - left, Decreased dorsiflexion - left, Shuffle, Ataxic, Drifts right/left, Narrow base of support General Gait Details: Patient with increased instablity during mobility. Patient with noted ataxia and decreased attention to left side. Decreased awareness of environment. LLE shuffling with poor clearence through stride and poor proprioception during advancement.  Gait velocity: decreased Gait velocity interpretation: <1.8 ft/sec, indicative of risk for recurrent falls    ADL: ADL Overall ADL's : Needs assistance/impaired Eating/Feeding: Minimal assistance, Sitting, Cueing for compensatory techinques Eating/Feeding Details (indicate cue type and reason): to find items on her left and to switch to using her right UE when her left UE is not able to do what she is trying to do. Grooming: Wash/dry face, Wash/dry hands, Oral care, Minimal assistance, Cueing for compensatory techniques, Sitting Grooming Details (indicate cue type and reason): for finding  items that she drops (right and left) and finding items on her left Upper Body Bathing: Minimal assitance, Sitting, Cueing for compensatory techniques Upper Body Bathing Details (indicate cue type and reason): and cuing to come back to midline after she starts leaning to the left Lower Body Bathing: Moderate assistance (with min A sit<>stand, but Mod A to maintain standing with dynamic activity) Upper Body Dressing : Moderate assistance, Sitting Lower Body Dressing: Moderate assistance (with min A sit<>stand, but Mod A to maintain standing with dynamic activity) Toilet Transfer: Minimal assistance, +2 for safety/equipment, Ambulation, Regular Toilet, Grab bars Toileting- Clothing Manipulation and Hygiene: Maximal assistance (with min A sit<>stand, but Mod A to maintain standing with dynamic activity)  Cognition: Cognition Overall Cognitive Status: Impaired/Different from baseline Arousal/Alertness: Awake/alert Orientation Level: Oriented to person, Oriented to time, Oriented to place Attention: Sustained Sustained Attention: Appears intact Memory: Impaired Memory Impairment: Prospective memory Awareness: Impaired Awareness Impairment: Intellectual impairment, Emergent impairment, Anticipatory impairment Problem Solving: Impaired Problem Solving Impairment: Functional complex Safety/Judgment: Impaired Cognition Arousal/Alertness: Awake/alert Behavior During Therapy: Impulsive Overall Cognitive Status: Impaired/Different from baseline Area of Impairment: Safety/judgement, Problem solving, Following commands Orientation Level:  (oriented except to why she was here ("i'm sick)) Following Commands: Follows one step commands inconsistently (due to [facility] and inattention) Safety/Judgement: Decreased awareness of safety, Decreased  awareness of deficits Problem Solving: Difficulty sequencing, Requires verbal cues, Requires tactile cues General Comments: Pt with ataxic and apraxic movements of  left UE. Decreased attention to left side and sensation  Blood pressure 187/77, pulse 67, temperature 97.7 F (36.5 C), temperature source Axillary, resp. rate 22, height 5\' 3"  (1.6 m), weight 49.442 kg (109 lb), SpO2 97 %. Physical Exam  Nursing note and vitals reviewed. Constitutional: She is oriented to person, place, and time. She appears well-developed and well-nourished.  HENT:  Head: Normocephalic and atraumatic.  Eyes: Conjunctivae are normal. Pupils are equal, round, and reactive to light.  Neck: Normal range of motion. Neck supple.  Cardiovascular: Normal rate and regular rhythm.   No murmur heard. Respiratory: Effort normal and breath sounds normal. No respiratory distress. She has no wheezes.  GI: Soft. Bowel sounds are normal. She exhibits no distension. There is no tenderness.  Musculoskeletal: She exhibits no edema or tenderness.  Neurological: She is alert and oriented to person, place, and time. Coordination abnormal.  HOH. Speech clear. Has difficulty with two step commands. Lacks awareness of current deficits.  Left sided weakness with sensory deficits. Left HH noted.     Skin: Skin is warm and dry.  Psychiatric: She has a normal mood and affect. Her behavior is normal. Her speech is not delayed and not slurred. She expresses inappropriate judgment. She is attentive.  Motor strength is 3 minus in the left deltoid biceps triceps, 2 minus in the left finger flexors and extensors Left lower extremity 3 minus at the hip flexor and extensor ankle dorsiflexor and plantar flexor 4+/5 in the right deltoid, biceps, triceps, grip, hip flexor, knee extensor, ankle dorsiflexion plantar flexor   No results found for this or any previous visit (from the past 24 hour(s)). Ct Head Wo Contrast  03/23/2015   CLINICAL DATA:  Acute onset of altered mental status and left-sided weakness. Status post fall from chair. Initial encounter.  EXAM: CT HEAD WITHOUT CONTRAST  TECHNIQUE: Contiguous  axial images were obtained from the base of the skull through the vertex without intravenous contrast.  COMPARISON:  CT of the head performed 12/30/2012  FINDINGS: There is no evidence of acute infarction, mass lesion, or intra- or extra-axial hemorrhage on CT.  Prominence of the ventricles and sulci reflects moderate cortical volume loss. Cerebellar atrophy is noted. Relatively diffuse periventricular and subcortical white matter change likely reflects small vessel ischemic microangiopathy.  The brainstem and fourth ventricle are within normal limits. The basal ganglia are unremarkable in appearance. The cerebral hemispheres demonstrate grossly normal gray-white differentiation. No mass effect or midline shift is seen.  There is no evidence of fracture; a mildly prominent ectopic rest is noted at the right occiput. The orbits are within normal limits. The paranasal sinuses and mastoid air cells are well-aerated. No significant soft tissue abnormalities are seen.  IMPRESSION: 1. No acute intracranial pathology seen on CT. 2. Moderate cortical volume loss and diffuse small vessel ischemic microangiopathy.   Electronically Signed   By: Roanna Raider M.D.   On: 03/23/2015 21:31    Mr Brain Wo Contrast  03/24/2015   CLINICAL DATA:  Gait imbalance, fall to the LEFT. Recurrent symptoms. Evaluate stroke. History of hypertension, breast cancer.  EXAM: MRI HEAD WITHOUT CONTRAST  MRA HEAD WITHOUT CONTRAST  TECHNIQUE: Multiplanar, multiecho pulse sequences of the brain and surrounding structures were obtained without intravenous contrast. Angiographic images of the head were obtained using MRA technique without contrast.  COMPARISON:  CT  head March 22, 2015  FINDINGS: MRI HEAD FINDINGS  4.8 x 2.8 cm area of reduced diffusion RIGHT parietal lobe, to lesser extent RIGHT frontal lobe about the central sulcus. Subcentimeter focus of vague reduced diffusion RIGHT pons. Areas show low ADC value and minimal FLAIR T2 hyperintense  signal. No midline shift or mass effect. Ventricles and sulci are normal for patient's age. Old LEFT putaminal in RIGHT caudate head lacunar infarct. Confluent supratentorial and to lesser extent pontine white matter T2 hyperintensities. Multiple tiny foci of susceptibility artifact predominately in the cerebellum.  No abnormal extra-axial fluid collections. Status post bilateral ocular lens implants. Mild paranasal sinus mucosal thickening with small LEFT ethmoid mucosal retention cyst. The mastoid air cells are well aerated. Moderate temporomandibular osteoarthrosis. No abnormal sellar expansion. No cerebellar tonsillar ectopia. No suspicious calvarial bone marrow signal. Patient is edentulous. Dehiscent cartilaginous nasal septum.  MRA HEAD FINDINGS  Anterior circulation: Flow related enhancement within the cervical, petrous, cavernous and supraclinoid internal carotid arteries, mildly stenotic RIGHT para clinoid internal carotid artery. Normal flow related enhancement within the A1 segments. Decreased flow related enhancement within the proximal RIGHT A2 segment associated with tortuosity favored to represent artifact. High-grade stenosis LEFT A2 segment. Normal flow related enhancement within the middle cerebral arteries.  Posterior circulation: LEFT vertebral artery is dominant. Normal flow related enhancement within the and basilar artery and main branch vessels. Robust RIGHT posterior communicating artery. Flow related enhancement within the posterior cerebral arteries.  No large vessel occlusion, aneurysm. Dolichoectasia. Diffuse moderate luminal regularity.  IMPRESSION: MRI HEAD: Acute RIGHT frontoparietal lobe infarct, MCA territory. Subcentimeter acute RIGHT pontine infarct.  Severe white matter changes compatible with chronic small vessel ischemic disease. Scattered susceptibility artifacts suggest sequelae of chronic hypertension without lobar hematoma.  MRA HEAD: No acute large vessel occlusion.  dolicoectatic intracranial vessels compatible chronic hypertension with superimposed moderate luminal irregularity commonly seen with atherosclerosis.  Focal high-grade stenosis LEFT A2 segment.   Electronically Signed   By: Awilda Metro M.D.   On: 03/24/2015 01:23      Assessment/Plan: Diagnosis: Right MCA distribution infarct with left hemiparesis, left neglect, left homonymous hemianopsia, Decreased awareness of deficits 1. Does the need for close, 24 hr/day medical supervision in concert with the patient's rehab needs make it unreasonable for this patient to be served in a less intensive setting? Yes 2. Co-Morbidities requiring supervision/potential complications: Hypertension, left ventricular hypertrophy 3. Due to bladder management, bowel management, safety, skin/wound care, disease management, medication administration, pain management and patient education, does the patient require 24 hr/day rehab nursing? Yes 4. Does the patient require coordinated care of a physician, rehab nurse, PT (1-2 hrs/day, 5 days/week), OT (once to hrs/day, 5 days/week) and SLP (0.5-1 hrs/day, 5 days/week) to address physical and functional deficits in the context of the above medical diagnosis(es)? Yes Addressing deficits in the following areas: balance, endurance, locomotion, strength, transferring, bowel/bladder control, bathing, dressing, feeding, grooming, toileting, cognition and speech 5. Can the patient actively participate in an intensive therapy program of at least 3 hrs of therapy per day at least 5 days per week? Yes 6. The potential for patient to make measurable gains while on inpatient rehab is good 7. Anticipated functional outcomes upon discharge from inpatient rehab are supervision  with PT, supervision with OT, supervision with SLP. 8. Estimated rehab length of stay to reach the above functional goals is: 14-18 days 9. Does the patient have adequate social supports and living environment to  accommodate these discharge functional goals?  Yes 10. Anticipated D/C setting: Home 11. Anticipated post D/C treatments: HH therapy 12. Overall Rehab/Functional Prognosis: good  RECOMMENDATIONS: This patient's condition is appropriate for continued rehabilitative care in the following setting: CIR Patient has agreed to participate in recommended program. Yes Note that insurance prior authorization may be required for reimbursement for recommended care.  Comment:     03/24/2015

## 2015-03-24 NOTE — Evaluation (Signed)
Occupational Therapy Evaluation Patient Details Name: Leah Figueroa MRN: [id number] DOB: [birth year] Today's Date: 03/24/2015    History of Present Illness 81 y.o. female presenting with acute onset left sided weakness, LHH and neglect.Patient found to have an acute RIGHT frontoparietal lobe infarct, MCA territory.Subcentimeter acute RIGHT pontine infarct.   Clinical Impression   This 81 yo female admitted with above presents to acute OT with decreased use of left side, decreased attention to left side, increased apraxia/ataxia to left side, decreased vision to left side, decreased balance, decreased mobility all affecting her PLOF of living completely independent and still working. She will benefit from acute OT with follow up OT on CIR to get back to as close to PLOF as possible.     Follow Up Recommendations  CIR    Equipment Recommendations   (TBD next venue)       Precautions / Restrictions Precautions Precautions: Fall Restrictions Weight Bearing Restrictions: No      Mobility Bed Mobility Overal bed mobility: Needs Assistance Bed Mobility: Supine to Sit     Supine to sit: Min assist     General bed mobility comments: with mod VCs for sequencing and to find upper bed rail to her left to help her get to EOB  Transfers Overall transfer level: Needs assistance Equipment used: 2 person hand held assist (for ambulation) Transfers: Sit to/from Stand Sit to Stand: Min assist              Balance Overall balance assessment: Needs assistance Sitting-balance support: Feet supported;Single extremity supported Sitting balance-[name] Scale: Poor Sitting balance - Comments: tendency to control lean left (does not just fall over) without attempt to correct wthout cues   Standing balance support: Bilateral upper extremity supported Standing balance-[name] Scale: Poor                              ADL Overall ADL's : Needs assistance/impaired Eating/Feeding:  Minimal assistance;Sitting;Cueing for compensatory techinques Eating/Feeding Details (indicate cue type and reason): to find items on her left and to switch to using her right UE when her left UE is not able to do what she is trying to do. Grooming: Therapist, nutritional;Wash/dry hands;Oral care;Minimal assistance;Cueing for compensatory techniques;Sitting Grooming Details (indicate cue type and reason): for finding items that she drops (right and left) and finding items on her left Upper Body Bathing: Minimal assitance;Sitting;Cueing for compensatory techniques Upper Body Bathing Details (indicate cue type and reason): and cuing to come back to midline after she starts leaning to the left Lower Body Bathing: Moderate assistance (with min A sit<>stand, but Mod A to maintain standing with dynamic activity)   Upper Body Dressing : Moderate assistance;Sitting   Lower Body Dressing: Moderate assistance (with min A sit<>stand, but Mod A to maintain standing with dynamic activity)   Toilet Transfer: Minimal assistance;+2 for safety/equipment;Ambulation;Regular Toilet;Grab bars   Toileting- Clothing Manipulation and Hygiene: Maximal assistance (with min A sit<>stand, but Mod A to maintain standing with dynamic activity)               Vision Vision Assessment?: Yes Eye Alignment: Within Functional Limits Ocular Range of Motion: Restricted on the left Alignment/Gaze Preference: Within Defined Limits Tracking/Visual Pursuits:  (tracks to the left past midline but not full AROM) Additional Comments: wears glasses all the time, has difficulty finding items to her left          Pertinent Vitals/Pain Pain Assessment: No/denies pain  Hand Dominance Right   Extremity/Trunk Assessment Upper Extremity Assessment Upper Extremity Assessment: LUE deficits/detail LUE Deficits / Details: decreased AROM, coordination, sensation, apraxic, ataxic--attmpts to use LUE Coordination: decreased fine  motor;decreased gross motor           Communication Communication Communication: HOH   Cognition Arousal/Alertness: Awake/alert Behavior During Therapy: Impulsive Overall Cognitive Status: Impaired/Different from baseline Area of Impairment: Safety/judgement;Problem solving;Following commands Orientation Level:  (oriented except to why she was here ("i'm sick))     Following Commands: Follows one step commands inconsistently (due to [facility] and inattention) Safety/Judgement: Decreased awareness of safety;Decreased awareness of deficits   Problem Solving: Difficulty sequencing;Requires verbal cues;Requires tactile cues General Comments: Pt with ataxic and apraxic movements of left UE. Decreased attention to left side and sensation              Home Living Family/patient expects to be discharged to:: Private residence Living Arrangements: Alone Available Help at Discharge: Family (unknown how much) Type of Home: House                              Lives With: Alone    Prior Functioning/Environment Level of Independence: Independent        Comments: including driving and still working as a bookkeeper    OT Diagnosis: Generalized weakness;Cognitive deficits;Disturbance of vision;Hemiplegia non-dominant side;Apraxia;Ataxia   OT Problem List: Decreased strength;Decreased range of motion;Impaired balance (sitting and/or standing);Decreased safety awareness;Decreased cognition;Impaired sensation;Impaired UE functional use;Decreased knowledge of precautions;Decreased coordination;Impaired vision/perception;Decreased knowledge of use of DME or AE;Impaired tone   OT Treatment/Interventions: Richardson-care/ADL training;Therapeutic exercise;Neuromuscular education;DME and/or AE instruction;Cognitive remediation/compensation;Therapeutic activities;Balance training;Patient/family education;Visual/perceptual remediation/compensation    OT Goals(Current goals can be found in the  care plan section) Acute Rehab OT Goals Patient Stated Goal: to go to the bathroom, brush my teeth, eat breakfast, and go back to work. OT Goal Formulation: With patient Time For Goal Achievement: 03/31/15 Potential to Achieve Goals: Good  OT Frequency: Min 3X/week   Barriers to D/C:  (unsure)          Co-evaluation PT/OT/SLP Co-Evaluation/Treatment: Yes Reason for Co-Treatment: For patient/therapist safety   OT goals addressed during session: ADL's and Fales-care      End of Session Equipment Utilized During Treatment: Gait belt Nurse Communication: Mobility status  Activity Tolerance: Patient tolerated treatment well Patient left: in chair;with call bell/phone within reach;with chair alarm set   Time: 8119-1478 OT Time Calculation (min): 29 min Charges:  OT General Charges $OT Visit: 1 Procedure OT Evaluation $Initial OT Evaluation Tier I: 1 Procedure  Evette Georges [phone] 03/24/2015, 9:51 AM

## 2015-03-24 NOTE — Progress Notes (Signed)
Rehab Admissions Coordinator Note:  Patient was screened by Trish Mage for appropriateness for an Inpatient Acute Rehab Consult.  At this time,an inpatient rehab consult has been ordered and is pending.  We will follow up after consult is completed by MD.  Lelon Frohlich M 03/24/2015, 9:55 AM  I can be reached at [phone].

## 2015-03-24 NOTE — Progress Notes (Signed)
  Echocardiogram 2D Echocardiogram has been performed.  Leah Figueroa 03/24/2015, 11:02 AM

## 2015-03-24 NOTE — Progress Notes (Signed)
PT Cancellation Note  Patient Details Name: Leah Figueroa MRN: [id number] DOB: Dec 22, 1922   Cancelled Treatment:    Reason Eval/Treat Not Completed: Patient not medically ready, on active bedrest orders at this time.   Fabio Asa 03/24/2015, 8:06 AM Charlotte Crumb, PT DPT  [phone]

## 2015-03-24 NOTE — Progress Notes (Signed)
Patient trans into 4N12 this afternoon, alert and oriented but HOH. Assessments WDL. Placed on cardiac monitoring and call bell placed within reach.

## 2015-03-24 NOTE — Evaluation (Signed)
Physical Therapy Evaluation Patient Details Name: Leah Figueroa MRN: [id number] DOB: [birth year] Today's Date: 03/24/2015   History of Present Illness  81 y.o. female presenting with acute onset left sided weakness, LHH and neglect.Patient found to have an acute RIGHT frontoparietal lobe infarct, MCA territory.Subcentimeter acute RIGHT pontine infarct.  Clinical Impression  Patient demonstrates deficits in functional mobility as indicated below. Will benefit form continued skilled PT to address deficits and maximize function. Will see as indicated and progress as tolerated. OF NOTE: patient was independent and still working prior to admission, patient very motivated. Patient will benefit from comprehensive therapies upon acute discharge.     Follow Up Recommendations CIR;Supervision/Assistance - 24 hour    Equipment Recommendations  Rolling walker with 5" wheels (tbd)    Recommendations for Other Services Rehab consult     Precautions / Restrictions Precautions Precautions: Fall Restrictions Weight Bearing Restrictions: No      Mobility  Bed Mobility Overal bed mobility: Needs Assistance Bed Mobility: Supine to Sit     Supine to sit: Min assist     General bed mobility comments: with mod VCs for sequencing and to find upper bed rail to her left to help her get to EOB  Transfers Overall transfer level: Needs assistance Equipment used: 2 person hand held assist (for ambulation) Transfers: Sit to/from Stand Sit to Stand: Min assist         General transfer comment: Min assist for stability with Cues for safety and attention to task  Ambulation/Gait Ambulation/Gait assistance: Mod assist Ambulation Distance (Feet): 18 Feet Assistive device: 1 person hand held assist Gait Pattern/deviations: Step-to pattern;Decreased step length - left;Decreased dorsiflexion - left;Shuffle;Ataxic;Drifts right/left;Narrow base of support Gait velocity: decreased Gait velocity  interpretation: <1.8 ft/sec, indicative of risk for recurrent falls General Gait Details: Patient with increased instablity during mobility. Patient with noted ataxia and decreased attention to left side. Decreased awareness of environment. LLE shuffling with poor clearence through stride and poor proprioception during advancement.   Stairs            Wheelchair Mobility    Modified Rankin (Stroke Patients Only) Modified Rankin (Stroke Patients Only) Pre-Morbid Rankin Score: No symptoms Modified Rankin: Moderately severe disability     Balance Overall balance assessment: Needs assistance Sitting-balance support: Feet supported;Single extremity supported Sitting balance-[name] Scale: Poor Sitting balance - Comments: tendency to control lean left (does not just fall over) without attempt to correct wthout cues   Standing balance support: Bilateral upper extremity supported Standing balance-[name] Scale: Poor                               Pertinent Vitals/Pain Pain Assessment: No/denies pain    Home Living Family/patient expects to be discharged to:: Private residence Living Arrangements: Alone Available Help at Discharge: Family (unknown how much) Type of Home: House         Home Equipment: None      Prior Function Level of Independence: Independent         Comments: 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    Extremity/Trunk Assessment   Upper Extremity Assessment: LUE deficits/detail       LUE Deficits / Details: decreased AROM, coordination, sensation, apraxic, ataxic--attmpts to use   Lower Extremity Assessment: LLE deficits/detail   LLE Deficits / Details: LLE weakness upon assessment, asymetrical compared to RLE, decreased dorsiflexion  during functional tasks  Cervical / Trunk Assessment: Kyphotic  Communication   Communication: HOH  Cognition Arousal/Alertness:  Awake/alert Behavior During Therapy: Impulsive Overall Cognitive Status: Impaired/Different from baseline Area of Impairment: Safety/judgement;Problem solving;Following commands Orientation Level:  (oriented except to why she was here ("i'm sick))     Following Commands: Follows one step commands inconsistently (due to [facility] and inattention) Safety/Judgement: Decreased awareness of safety;Decreased awareness of deficits   Problem Solving: Difficulty sequencing;Requires verbal cues;Requires tactile cues General Comments: Pt with ataxic and apraxic movements of left UE. Decreased attention to left side and sensation    General Comments      Exercises        Assessment/Plan    PT Assessment Patient needs continued PT services  PT Diagnosis Difficulty walking;Abnormality of gait   PT Problem List Decreased strength;Decreased activity tolerance;Decreased balance;Decreased mobility;Decreased coordination;Decreased cognition;Decreased safety awareness  PT Treatment Interventions DME instruction;Gait training;Functional mobility training;Therapeutic activities;Therapeutic exercise;Balance training;Cognitive remediation;Patient/family education   PT Goals (Current goals can be found in the Care Plan section) Acute Rehab PT Goals Patient Stated Goal: to go to the bathroom, brush my teeth, eat breakfast, and go back to work. PT Goal Formulation: With patient Time For Goal Achievement: 04/07/15 Potential to Achieve Goals: Good    Frequency Min 3X/week   Barriers to discharge Decreased caregiver support lives alone    Co-evaluation   Reason for Co-Treatment: For patient/therapist safety   OT goals addressed during session: ADL's and Soller-care       End of Session Equipment Utilized During Treatment: Gait belt Activity Tolerance: Patient tolerated treatment well Patient left: in chair;with call bell/phone within reach;with chair alarm set Nurse Communication: Mobility status          Time: 1610-9604 PT Time Calculation (min) (ACUTE ONLY): 29 min   Charges:   PT Evaluation $Initial PT Evaluation Tier I: 1 Procedure     PT G CodesFabio Asa 03-26-15, 11:15 AM Charlotte Crumb, PT DPT  [phone]

## 2015-03-24 NOTE — Progress Notes (Signed)
OT Cancellation Note  Patient Details Name: BREEANNE OBLINGER MRN: [id number] DOB: 1923-04-17   Cancelled Treatment:    Reason Eval/Treat Not Completed: Other (comment). Pt on bedrest.  Evette Georges [phone] 03/24/2015, 8:08 AM

## 2015-03-25 MED ORDER — PRAVASTATIN SODIUM 40 MG PO TABS
40.0000 mg | ORAL_TABLET | Freq: Every day | ORAL | Status: DC
  Administered 2015-03-25: 40 mg via ORAL
  Filled 2015-03-25: qty 1

## 2015-03-25 MED ORDER — LEVOTHYROXINE SODIUM 25 MCG PO TABS
25.0000 ug | ORAL_TABLET | Freq: Every day | ORAL | Status: DC
  Administered 2015-03-26: 25 ug via ORAL
  Filled 2015-03-25: qty 1

## 2015-03-25 MED ORDER — PANTOPRAZOLE SODIUM 40 MG PO TBEC: 40.0000 mg | DELAYED_RELEASE_TABLET | Freq: Every day | ORAL | Status: DC

## 2015-03-25 NOTE — Progress Notes (Addendum)
Physical Therapy Treatment Patient Details Name: Leah Figueroa MRN: [id number] DOB: [birth year] Today's Date: 03/25/2015    History of Present Illness 81 y.o. female presenting with acute onset left sided weakness, LHH and neglect.Patient found to have an acute RIGHT frontoparietal lobe infarct, MCA territory.Subcentimeter acute RIGHT pontine infarct.    PT Comments    Patient seen for progression of therapy services. Patient continues to demonstrate significant deficits in function at this time but does show some improvements over previous session in attention, balance, and activity tolerance. Continue to recommend CIR upon acute discharge. Family present for session, educated on activities to promote attention to left side. Will continue to see and progress as tolerated.   Follow Up Recommendations  CIR;Supervision/Assistance - 24 hour     Equipment Recommendations  Rolling walker with 5" wheels (tbd)    Recommendations for Other Services Rehab consult     Precautions / Restrictions Precautions Precautions: Fall Restrictions Weight Bearing Restrictions: No    Mobility  Bed Mobility Received in chair  Transfers Overall transfer level: Needs assistance Equipment used: 1 person hand held assist Transfers: Sit to/from Stand Sit to Stand: Min assist         General transfer comment: Min assist for stability and power up to standing, Cues for attention to LUE, cues for posture and faciliation of stance  Ambulation/Gait Ambulation/Gait assistance: Mod assist Ambulation Distance (Feet): 15 Feet (x2) Assistive device: 1 person hand held assist Gait Pattern/deviations: Step-to pattern;Decreased step length - left;Decreased dorsiflexion - left;Shuffle;Ataxic;Drifts right/left;Narrow base of support Gait velocity: decreased Gait velocity interpretation: <1.8 ft/sec, indicative of risk for recurrent falls General Gait Details: patient with improvements noted in LLE placement and  stride this session, but as patient fatigued increased ataxia noted. Patient required manual cues to faciliate weight shift and increased physical assist for stability.   Stairs            Wheelchair Mobility    Modified Rankin (Stroke Patients Only) Modified Rankin (Stroke Patients Only) Pre-Morbid Rankin Score: No symptoms Modified Rankin: Moderately severe disability     Balance   Sitting-balance support: Feet supported Sitting balance-[name] Scale: Poor Sitting balance - Comments: LLE noted in sitting   Standing balance support: Bilateral upper extremity supported Standing balance-[name] Scale: Poor (LUE unable to maintain support without increased assist)                      Cognition Arousal/Alertness: Awake/alert Behavior During Therapy: Impulsive Overall Cognitive Status: Impaired/Different from baseline Area of Impairment: Safety/judgement;Problem solving;Following commands Orientation Level:  (oriented except to why she was here ("i'm sick))     Following Commands: Follows one step commands inconsistently (due to [facility] and inattention) Safety/Judgement: Decreased awareness of safety;Decreased awareness of deficits   Problem Solving: Difficulty sequencing;Requires verbal cues;Requires tactile cues General Comments: Some noted improvements in attention this session, but continues to demonstrate impulsivity     Exercises      General Comments General comments (skin integrity, edema, etc.): Perfromed static standing at bed rail x3 with  mini squats with assist and faciliatation of postures, increased assist for weight shifts. Attempted sigle leg stance, unable to perform at this time.      Pertinent Vitals/Pain Pain Assessment: No/denies pain (family reported knee pain but pt says no pain at this time)    Home Living                      Prior Function  PT Goals (current goals can now be found in the care plan section) Acute  Rehab PT Goals Patient Stated Goal: to go to the bathroom, brush my teeth, eat breakfast, and go back to work. PT Goal Formulation: With patient Time For Goal Achievement: 04/07/15 Potential to Achieve Goals: Good Progress towards PT goals: Progressing toward goals    Frequency  Min 3X/week    PT Plan Current plan remains appropriate    Co-evaluation             End of Session Equipment Utilized During Treatment: Gait belt Activity Tolerance: Patient tolerated treatment well Patient left: in chair;with call bell/phone within reach;with chair alarm set;with family/visitor present     Time: 1700-1721 PT Time Calculation (min) (ACUTE ONLY): 21 min  Charges:  $Therapeutic Activity: 8-22 mins                    G CodesFabio Asa 04/17/2015, 5:31 PM Charlotte Crumb, PT DPT  [phone]

## 2015-03-25 NOTE — Care Management Important Message (Signed)
Important Message  Patient Details  Name: Leah Figueroa MRN: [id number] Date of Birth: 1923-04-23   Medicare Important Message Given:  Yes-second notification given    Orson Aloe 03/25/2015, 2:21 PM

## 2015-03-25 NOTE — Progress Notes (Signed)
STROKE TEAM PROGRESS NOTE   HISTORY Leah Figueroa is a 81 y.o. female who was at home with family. While speaking with family was noted to start to lean to the left. The patient then attempted to stand and fell to the left. She was seated but this happened again. EMS was called at that time and the patient was brought in for evaluation. Symptoms initially improved but after presentation patient again became weak on the left. NIHSS of 6. Code stroke called at that time. Patient on ASA at home.  Patient remains functional. Lives alone. Drives. Works as a Catering manager.   Date last known well: Date: 03/22/2015 Time last known well: Time: 22:00 tPA Given: Yes   SUBJECTIVE (INTERVAL HISTORY) Daughter at bedside. CIR recommended. Pt lived at home prior to admission.    OBJECTIVE Temp:  [97.8 F (36.6 C)-99.3 F (37.4 C)] 97.8 F (36.6 C) (08/09 0944) Pulse Rate:  [57-72] 62 (08/09 0944) Cardiac Rhythm:  [-] Normal sinus rhythm (08/08 1200) Resp:  [16-22] 16 (08/09 0944) BP: (133-187)/(55-77) 133/70 mmHg (08/09 0944) SpO2:  [94 %-99 %] 97 % (08/09 0944)  No results for input(s): GLUCAP in the last 168 hours.  Recent Labs Lab 03/23/15 0019 03/23/15 0026  NA 139 142  K 3.8 3.7  CL 106 106  CO2 24  --   GLUCOSE 155* 154*  BUN 12 16  CREATININE 0.67 0.60  CALCIUM 9.2  --     Recent Labs Lab 03/23/15 0019  AST 29  ALT 12*  ALKPHOS 64  BILITOT 0.5  PROT 6.8  ALBUMIN 3.5    Recent Labs Lab 03/23/15 0019 03/23/15 0026  WBC 5.9  --   NEUTROABS 3.7  --   HGB 9.9* 10.9*  HCT 32.3* 32.0*  MCV 80.3  --   PLT 212  --    No results for input(s): CKTOTAL, CKMB, CKMBINDEX, TROPONINI in the last 168 hours.  Recent Labs  03/23/15 0019  LABPROT 13.9  INR 1.05    Recent Labs  03/23/15 0257  COLORURINE YELLOW  LABSPEC 1.008  PHURINE 6.0  GLUCOSEU NEGATIVE  HGBUR SMALL*  BILIRUBINUR NEGATIVE  KETONESUR NEGATIVE  PROTEINUR NEGATIVE  UROBILINOGEN 1.0   NITRITE NEGATIVE  LEUKOCYTESUR SMALL*       Component Value Date/Time   CHOL 152 03/23/2015 0628   TRIG 54 03/23/2015 0628   HDL 47 03/23/2015 0628   CHOLHDL 3.2 03/23/2015 0628   VLDL 11 03/23/2015 0628   LDLCALC 94 03/23/2015 0628   Lab Results  Component Value Date   HGBA1C 5.8* 03/23/2015      Component Value Date/Time   LABOPIA NONE DETECTED 03/23/2015 0257   COCAINSCRNUR NONE DETECTED 03/23/2015 0257   LABBENZ NONE DETECTED 03/23/2015 0257   AMPHETMU NONE DETECTED 03/23/2015 0257   THCU NONE DETECTED 03/23/2015 0257   LABBARB NONE DETECTED 03/23/2015 0257     Recent Labs Lab 03/23/15 0019  ETH <5   CT of the head without contrast - 1. No acute intracranial pathology seen on CT. 2. Moderate cortical volume loss and diffuse small vessel ischemic microangiopathy  MRI  of the head - Acute RIGHT frontoparietal lobe infarct, MCA territory. Subcentimeter acute RIGHT pontine infarct..Severe white matter changes compatible with chronic small vessel ischemic disease. Scattered susceptibility artifacts suggest sequelae of chronic hypertension without lobar hematoma  MRA HEAD: No acute large vessel occlusion. dolicoectatic intracranial vessels compatible chronic hypertension withsuperimposed moderate luminal irregularity commonly seen with atherosclerosis.Focal high-grade stenosis LEFT A2  segment.  Carotid Duplex No evidence of a significant stenosis noted in bilateral ICAs 1-39%. Bilateral vertebral arteries demonstrate normal antegrade flow.  2D Echocardiogram  - Left ventricle: The cavity size was normal. There was moderateconcentric hypertrophy. Systolic function was normal. Theestimated ejection fraction was in the range of 60% to 65%. Thereis akinesis of the apical myocardium. Doppler parameters areconsistent with abnormal left ventricular relaxation (grade 1diastolic dysfunction). Mitral valve: Moderately calcified annulus.  Impressions:  No cardiac source of emboli  was indentified.   PHYSICAL EXAM Frail elderly caucasian lady not in distress. . Afebrile. Head is nontraumatic. Neck is supple without bruit.    Cardiac exam no murmur or gallop. Lungs are clear to auscultation. Distal pulses are well felt.  Neurological Exam :  Awake alert oriented to time place and person. Speech is clear without dysarthria or aphasia. Right gaze preference but able to look to the left all the way. Pupils equal reactive. Fundi were not visualized. Dense left homonymous hemianopsia. No facial weakness. Tongue midline. Motor system exam revealed mild left upper extremity drift with significant weakness of left grip, wrist and intrinsic hand muscles. 3/5 proximal strength in the left upper extremity. No lower extremity drift on the left but mild weakness of left hip flexor and ankle dorsiflexors. Normal strength on the right. Mild diminished left hemibody sensation. Mild left hemi-inattention. Able to recognize left side of her body. The tendon reflexes are normal on the right depressed on the left. Right plantar downgoing left is equivocal. Mild impaired left finger-to-nose coordination but knee to heel coordination is intact. Gait was not tested. NIH stroke scale score 6   ASSESSMENT/PLAN Leah Figueroa is a 81 y.o. female with history of coronary artery disease with previous MI, hypertension, and hyperlipidemia presenting with left hemiparesis. She did receive IV t-PA 44 mg on 03/23/2015 at 0100 hrs.  Stroke:  Non-dominant Right MCA infarct s/p IV tPA. Stroke etiology unknown.  Resultant  left hemiparesis, left hemisensory deficit, left visual field deficits.  MRI  Acute RIGHT frontoparietal lobe infarct, MCA territory. Subcentimeter acute RIGHT pontine infarct.  MRA   No acute large vessel occlusion.  Carotid Doppler  No significant stenosis   2D Echo  No source of embolus. Thereis akinesis of the apical myocardium.  LDL 94  HgbA1c 5.8%  SCDs for VTE  prophylaxis Diet regular Room service appropriate?: Yes; Fluid consistency:: Thin  aspirin 81 mg orally every day prior to admission, now on aspirin 81 mg orally every day   Given age, will not do further stroke etiology workup.  Ongoing aggressive stroke risk factor management  Therapy recommendations: CIR  Disposition:  Pending. Medically ready for discharge once bed is available  Hypertension  Home meds: Metoprolol  Stable  Hyperlipidemia  Home meds:  Mevacor 20 mg daily resumed in hospital  LDL 94, goal < 70  Increases Mevacor to 40 mg daily (using pravachol 40 in hospital)  Continue statin at discharge  Elevated glucose levels  150s  HgbA1c 5.8, at goal < 7.0  No history of diabetes  Other Stroke Risk Factors  Advanced age  Coronary artery disease s/p MI  Other Active Problems  Anemia, stable  Hospital day # 2  Rhoderick Moody [facility] See Amion for Pager information 03/25/2015 5:11 PM  I have personally examined this patient, reviewed notes, independently viewed imaging studies, participated in medical decision making and plan of care. I have made any additions or clarifications directly to the above  note. Agree with note above.   Delia Heady, MD Medical Director [facility] Pager: [phone] 03/26/2015 11:14 AM   To contact Stroke Continuity provider, please refer to [email or web address]. After hours, contact General Neurology

## 2015-03-26 ENCOUNTER — Inpatient Hospital Stay (HOSPITAL_COMMUNITY)
Admission: RE | Admit: 2015-03-26 | Discharge: 2015-04-08 | DRG: 057 | Disposition: A | Discharge status: Skilled Nursing Facility | Payer: Medicare Other | Source: Intra-hospital | Attending: Physical Medicine & Rehabilitation | Admitting: Physical Medicine & Rehabilitation

## 2015-03-26 DIAGNOSIS — D649 Anemia, unspecified: Secondary | ICD-10-CM | POA: Diagnosis present

## 2015-03-26 DIAGNOSIS — R414 Neurologic neglect syndrome: Secondary | ICD-10-CM | POA: Diagnosis present

## 2015-03-26 DIAGNOSIS — S60212A Contusion of left wrist, initial encounter: Secondary | ICD-10-CM | POA: Diagnosis not present

## 2015-03-26 DIAGNOSIS — I639 Cerebral infarction, unspecified: Secondary | ICD-10-CM | POA: Diagnosis not present

## 2015-03-26 DIAGNOSIS — R4189 Other symptoms and signs involving cognitive functions and awareness: Secondary | ICD-10-CM | POA: Diagnosis present

## 2015-03-26 DIAGNOSIS — E876 Hypokalemia: Secondary | ICD-10-CM | POA: Diagnosis present

## 2015-03-26 DIAGNOSIS — I69398 Other sequelae of cerebral infarction: Secondary | ICD-10-CM | POA: Diagnosis not present

## 2015-03-26 DIAGNOSIS — G8194 Hemiplegia, unspecified affecting left nondominant side: Secondary | ICD-10-CM

## 2015-03-26 DIAGNOSIS — R739 Hyperglycemia, unspecified: Secondary | ICD-10-CM | POA: Diagnosis present

## 2015-03-26 DIAGNOSIS — M11832 Other specified crystal arthropathies, left wrist: Secondary | ICD-10-CM | POA: Diagnosis not present

## 2015-03-26 DIAGNOSIS — I252 Old myocardial infarction: Secondary | ICD-10-CM | POA: Diagnosis not present

## 2015-03-26 DIAGNOSIS — K5900 Constipation, unspecified: Secondary | ICD-10-CM | POA: Diagnosis not present

## 2015-03-26 DIAGNOSIS — I6330 Cerebral infarction due to thrombosis of unspecified cerebral artery: Secondary | ICD-10-CM | POA: Diagnosis not present

## 2015-03-26 DIAGNOSIS — G8190 Hemiplegia, unspecified affecting unspecified side: Secondary | ICD-10-CM | POA: Diagnosis not present

## 2015-03-26 DIAGNOSIS — M542 Cervicalgia: Secondary | ICD-10-CM | POA: Diagnosis present

## 2015-03-26 DIAGNOSIS — E785 Hyperlipidemia, unspecified: Secondary | ICD-10-CM | POA: Diagnosis present

## 2015-03-26 DIAGNOSIS — Z0000 Encounter for general adult medical examination without abnormal findings: Secondary | ICD-10-CM

## 2015-03-26 DIAGNOSIS — I10 Essential (primary) hypertension: Secondary | ICD-10-CM | POA: Diagnosis present

## 2015-03-26 DIAGNOSIS — M19032 Primary osteoarthritis, left wrist: Secondary | ICD-10-CM | POA: Diagnosis present

## 2015-03-26 DIAGNOSIS — M11232 Other chondrocalcinosis, left wrist: Secondary | ICD-10-CM | POA: Diagnosis present

## 2015-03-26 DIAGNOSIS — I213 ST elevation (STEMI) myocardial infarction of unspecified site: Secondary | ICD-10-CM | POA: Diagnosis not present

## 2015-03-26 DIAGNOSIS — E039 Hypothyroidism, unspecified: Secondary | ICD-10-CM | POA: Diagnosis present

## 2015-03-26 DIAGNOSIS — H9190 Unspecified hearing loss, unspecified ear: Secondary | ICD-10-CM | POA: Diagnosis present

## 2015-03-26 DIAGNOSIS — H53469 Homonymous bilateral field defects, unspecified side: Secondary | ICD-10-CM | POA: Diagnosis present

## 2015-03-26 DIAGNOSIS — R2681 Unsteadiness on feet: Secondary | ICD-10-CM | POA: Diagnosis not present

## 2015-03-26 DIAGNOSIS — Z853 Personal history of malignant neoplasm of breast: Secondary | ICD-10-CM

## 2015-03-26 DIAGNOSIS — I69354 Hemiplegia and hemiparesis following cerebral infarction affecting left non-dominant side: Principal | ICD-10-CM

## 2015-03-26 DIAGNOSIS — R278 Other lack of coordination: Secondary | ICD-10-CM | POA: Diagnosis not present

## 2015-03-26 DIAGNOSIS — I63311 Cerebral infarction due to thrombosis of right middle cerebral artery: Secondary | ICD-10-CM | POA: Diagnosis present

## 2015-03-26 DIAGNOSIS — M6281 Muscle weakness (generalized): Secondary | ICD-10-CM | POA: Diagnosis not present

## 2015-03-26 DIAGNOSIS — M25432 Effusion, left wrist: Secondary | ICD-10-CM | POA: Diagnosis not present

## 2015-03-26 DIAGNOSIS — R531 Weakness: Secondary | ICD-10-CM | POA: Diagnosis not present

## 2015-03-26 DIAGNOSIS — H53462 Homonymous bilateral field defects, left side: Secondary | ICD-10-CM | POA: Diagnosis not present

## 2015-03-26 DIAGNOSIS — K5792 Diverticulitis of intestine, part unspecified, without perforation or abscess without bleeding: Secondary | ICD-10-CM | POA: Diagnosis not present

## 2015-03-26 MED ORDER — LEVOTHYROXINE SODIUM 25 MCG PO TABS
25.0000 ug | ORAL_TABLET | Freq: Every day | ORAL | Status: DC
  Administered 2015-03-27 – 2015-04-08 (×13): 25 ug via ORAL
  Filled 2015-03-26 (×14): qty 1

## 2015-03-26 MED ORDER — GUAIFENESIN-DM 100-10 MG/5ML PO SYRP: 5.0000 mL | ORAL_SOLUTION | Freq: Four times a day (QID) | ORAL | Status: DC | PRN

## 2015-03-26 MED ORDER — PROCHLORPERAZINE MALEATE 5 MG PO TABS: 5.0000 mg | ORAL_TABLET | Freq: Four times a day (QID) | ORAL | Status: DC | PRN

## 2015-03-26 MED ORDER — BISACODYL 10 MG RE SUPP: 10.0000 mg | Freq: Every day | RECTAL | Status: DC | PRN

## 2015-03-26 MED ORDER — CLOPIDOGREL BISULFATE 75 MG PO TABS
75.0000 mg | ORAL_TABLET | Freq: Every day | ORAL | Status: DC
  Administered 2015-03-27 – 2015-04-08 (×13): 75 mg via ORAL
  Filled 2015-03-26 (×13): qty 1

## 2015-03-26 MED ORDER — METOPROLOL TARTRATE 50 MG PO TABS
50.0000 mg | ORAL_TABLET | Freq: Two times a day (BID) | ORAL | Status: DC
  Administered 2015-03-26 – 2015-04-08 (×26): 50 mg via ORAL
  Filled 2015-03-26 (×5): qty 1
  Filled 2015-03-26: qty 2
  Filled 2015-03-26 (×21): qty 1

## 2015-03-26 MED ORDER — WHITE PETROLATUM GEL
Status: AC
  Filled 2015-03-26: qty 1

## 2015-03-26 MED ORDER — METHOCARBAMOL 500 MG PO TABS: 500.0000 mg | ORAL_TABLET | Freq: Four times a day (QID) | ORAL | Status: DC | PRN

## 2015-03-26 MED ORDER — NAPROXEN 250 MG PO TABS
250.0000 mg | ORAL_TABLET | Freq: Three times a day (TID) | ORAL | Status: DC | PRN
  Administered 2015-03-26: 250 mg via ORAL
  Filled 2015-03-26: qty 1

## 2015-03-26 MED ORDER — AMLODIPINE BESYLATE 2.5 MG PO TABS
2.5000 mg | ORAL_TABLET | Freq: Every day | ORAL | Status: DC
  Administered 2015-03-27 – 2015-04-08 (×13): 2.5 mg via ORAL
  Filled 2015-03-26 (×13): qty 1

## 2015-03-26 MED ORDER — PROCHLORPERAZINE 25 MG RE SUPP: 12.5000 mg | Freq: Four times a day (QID) | RECTAL | Status: DC | PRN

## 2015-03-26 MED ORDER — PANTOPRAZOLE SODIUM 40 MG PO TBEC: 40.0000 mg | DELAYED_RELEASE_TABLET | Freq: Every day | ORAL | Status: DC

## 2015-03-26 MED ORDER — SENNOSIDES-DOCUSATE SODIUM 8.6-50 MG PO TABS: 1.0000 | ORAL_TABLET | Freq: Every evening | ORAL | Status: DC | PRN

## 2015-03-26 MED ORDER — ALUM & MAG HYDROXIDE-SIMETH 200-200-20 MG/5ML PO SUSP
30.0000 mL | ORAL | Status: DC | PRN
  Administered 2015-03-26: 30 mL via ORAL
  Filled 2015-03-26: qty 30

## 2015-03-26 MED ORDER — PRAVASTATIN SODIUM 20 MG PO TABS
40.0000 mg | ORAL_TABLET | Freq: Every day | ORAL | Status: DC
  Administered 2015-03-26 – 2015-04-07 (×13): 40 mg via ORAL
  Filled 2015-03-26 (×2): qty 1
  Filled 2015-03-26 (×2): qty 2
  Filled 2015-03-26 (×5): qty 1
  Filled 2015-03-26 (×2): qty 2
  Filled 2015-03-26: qty 1
  Filled 2015-03-26 (×3): qty 2
  Filled 2015-03-26: qty 1

## 2015-03-26 MED ORDER — DIPHENHYDRAMINE HCL 12.5 MG/5ML PO ELIX: 12.5000 mg | ORAL_SOLUTION | Freq: Four times a day (QID) | ORAL | Status: DC | PRN

## 2015-03-26 MED ORDER — BOOST / RESOURCE BREEZE PO LIQD
1.0000 | Freq: Three times a day (TID) | ORAL | Status: DC
  Administered 2015-03-26 – 2015-04-08 (×28): 1 via ORAL

## 2015-03-26 MED ORDER — ACETAMINOPHEN 325 MG PO TABS
325.0000 mg | ORAL_TABLET | ORAL | Status: DC | PRN
  Administered 2015-03-26: 650 mg via ORAL
  Filled 2015-03-26: qty 2

## 2015-03-26 MED ORDER — TRAZODONE HCL 50 MG PO TABS
25.0000 mg | ORAL_TABLET | Freq: Every evening | ORAL | Status: DC | PRN
  Administered 2015-03-26 – 2015-04-05 (×9): 25 mg via ORAL
  Administered 2015-04-07: 50 mg via ORAL
  Filled 2015-03-26 (×10): qty 1

## 2015-03-26 MED ORDER — FLEET ENEMA 7-19 GM/118ML RE ENEM: 1.0000 | ENEMA | Freq: Once | RECTAL | Status: DC | PRN

## 2015-03-26 MED ORDER — TRAMADOL HCL 50 MG PO TABS: 50.0000 mg | ORAL_TABLET | Freq: Four times a day (QID) | ORAL | Status: DC | PRN

## 2015-03-26 MED ORDER — PROCHLORPERAZINE EDISYLATE 5 MG/ML IJ SOLN: 5.0000 mg | Freq: Four times a day (QID) | INTRAMUSCULAR | Status: DC | PRN

## 2015-03-26 MED ORDER — CLOPIDOGREL BISULFATE 75 MG PO TABS: 75.0000 mg | ORAL_TABLET | Freq: Every day | ORAL | Status: DC

## 2015-03-26 MED ORDER — ENOXAPARIN SODIUM 30 MG/0.3ML ~~LOC~~ SOLN
30.0000 mg | SUBCUTANEOUS | Status: DC
  Administered 2015-03-27 – 2015-04-07 (×12): 30 mg via SUBCUTANEOUS
  Filled 2015-03-26 (×12): qty 0.3

## 2015-03-26 NOTE — H&P (Signed)
Physical Medicine and Rehabilitation Admission H&P   Chief Complaint  Patient presents with  . Left sided weakness with sensory deficits, left visual field deficits with left neglect.    HPI: Leah Figueroa is a 81 y.o. female with history of HTN, HA, who was at home with family when she started to lean to the left then fell with attempts at waking. She was evaluated in ED on 08/06 and developed left sided weakness after presentation and received t-PA. MRI/MRA brain done revealing Right MCA infarct affecting frontoparietal lobe, subcentimeter right pontine infarct, severe white mater changes due to SVD and focal high grade stenosis left A2 segment. 2D echo with EF 60-65% with apical akinesis, moderate concentric LVH and moderately calcified MV. Carotid dopplers without significant ICA stenosis. Dr. Pearlean Brownie recommended ASA for R-MCA infarct of unknown etiology. ST evaluation revealed confusion with complex verbal instructions and poor awareness of deficits. Patient with resultant left sided weakness with sensory deficits. Left visual field deficits with left inattention as well as mild cognitive deficits. PT/OT evaluations done and CIR recommended by MD and Rehab team.   Pt remembers me from yesterday as well as my specialty.  ROS No pain c/os, Has weakness in LUE >LE, no swallowing issues Review of Systems  Constitutional: Negative.   HENT: Negative.   Eyes: Negative.   Respiratory: Negative.   Cardiovascular: Negative.   Gastrointestinal: Negative.   Genitourinary: Negative.   Musculoskeletal: Negative.   Skin: Negative.   Neurological: Positive for tingling, sensory change and focal weakness.  Endo/Heme/Allergies: Negative.   Psychiatric/Behavioral: Negative.     Past Medical History  Diagnosis Date  . Other specified personal history presenting hazards to health(V15.89)   . Personal history of arthritis   . Unspecified personal history presenting hazards to  health   . Cramp of limb   . Acute myocardial infarction of other anterior wall, episode of care unspecified   . Personal history of malignant neoplasm of breast   . Unspecified essential hypertension   . Personal history of other diseases of digestive system   . Unspecified hypothyroidism   . Other and unspecified hyperlipidemia     Past Surgical History  Procedure Laterality Date  . Right thyroid lobectomy    . Cataract extraction      left-2006, right 2011  . Intrathoracic goiter resection    . Cholecystectomy    . Lumpectomy for breast cancer  80's    left  . Back surgery      Family History  Problem Relation Age of Onset  . Colon cancer Mother   . Coronary artery disease Brother   . Breast cancer Mother      Social History: Lives alone. Works as a Catering manager. She reports that she has never smoked. She has never used smokeless tobacco. She reports that she does not drink alcohol or use illicit drugs.    Allergies: No Known Allergies    Medications Prior to Admission  Medication Sig Dispense Refill  . amLODipine (NORVASC) 2.5 MG tablet Take 2.5 mg by mouth daily.    Marland Kitchen aspirin EC 81 MG tablet Take 81 mg by mouth every morning.    Marland Kitchen ibuprofen (ADVIL,MOTRIN) 200 MG tablet Take 200 mg by mouth 2 (two) times daily as needed for moderate pain.    Marland Kitchen levothyroxine (SYNTHROID, LEVOTHROID) 25 MCG tablet Take 1 tablet by mouth daily 90 tablet 0  . metoprolol (LOPRESSOR) 50 MG tablet Take 1 tablet by mouth two times daily 180  tablet 0  . furosemide (LASIX) 20 MG tablet Take 1 tablet (20 mg total) by mouth daily. (Patient not taking: Reported on 03/23/2015) 90 tablet 3  . lovastatin (MEVACOR) 20 MG tablet Take 1 tablet by mouth at bedtime 90 tablet 0    Home: Home Living Family/patient expects to be discharged to:: Private residence Living Arrangements:  Alone Available Help at Discharge: Other (Comment) (daughter is planning to arrange for 24/7 assist/(S)) Type of Home: House Home Equipment: None Lives With: Alone  Functional History: Prior Function Level of Independence: Independent Comments: including driving and still working as a Radio producer Status:  Mobility: Bed Mobility Overal bed mobility: Needs Assistance Bed Mobility: Supine to Sit Supine to sit: Min assist General bed mobility comments: with mod VCs for sequencing and to find upper bed rail to her left to help her get to EOB Transfers Overall transfer level: Needs assistance Equipment used: 1 person hand held assist Transfers: Sit to/from Stand Sit to Stand: Min assist General transfer comment: Min assist for stability and power up to standing, Cues for attention to LUE, cues for posture and faciliation of stance Ambulation/Gait Ambulation/Gait assistance: Mod assist Ambulation Distance (Feet): 15 Feet (x2) Assistive device: 1 person hand held assist Gait Pattern/deviations: Step-to pattern, Decreased step length - left, Decreased dorsiflexion - left, Shuffle, Ataxic, Drifts right/left, Narrow base of support General Gait Details: patient with improvements noted in LLE placement and stride this session, but as patient fatigued increased ataxia noted. Patient required manual cues to faciliate weight shift and increased physical assist for stability. Gait velocity: decreased Gait velocity interpretation: <1.8 ft/sec, indicative of risk for recurrent falls    ADL: ADL Overall ADL's : Needs assistance/impaired Eating/Feeding: Minimal assistance, Sitting, Cueing for compensatory techinques Eating/Feeding Details (indicate cue type and reason): to find items on her left and to switch to using her right UE when her left UE is not able to do what she is trying to do. Grooming: Wash/dry face, Wash/dry hands, Oral care, Minimal assistance, Cueing for  compensatory techniques, Sitting Grooming Details (indicate cue type and reason): for finding items that she drops (right and left) and finding items on her left Upper Body Bathing: Minimal assitance, Sitting, Cueing for compensatory techniques Upper Body Bathing Details (indicate cue type and reason): and cuing to come back to midline after she starts leaning to the left Lower Body Bathing: Moderate assistance (with min A sit<>stand, but Mod A to maintain standing with dynamic activity) Upper Body Dressing : Moderate assistance, Sitting Lower Body Dressing: Moderate assistance (with min A sit<>stand, but Mod A to maintain standing with dynamic activity) Toilet Transfer: Minimal assistance, +2 for safety/equipment, Ambulation, Regular Toilet, Grab bars Toileting- Clothing Manipulation and Hygiene: Maximal assistance (with min A sit<>stand, but Mod A to maintain standing with dynamic activity)  Cognition: Cognition Overall Cognitive Status: Impaired/Different from baseline Arousal/Alertness: Awake/alert Orientation Level: Oriented X4 Attention: Sustained Sustained Attention: Appears intact Memory: Impaired Memory Impairment: Prospective memory Awareness: Impaired Awareness Impairment: Intellectual impairment, Emergent impairment, Anticipatory impairment Problem Solving: Impaired Problem Solving Impairment: Functional complex Safety/Judgment: Impaired Cognition Arousal/Alertness: Awake/alert Behavior During Therapy: Impulsive Overall Cognitive Status: Impaired/Different from baseline Area of Impairment: Safety/judgement, Problem solving, Following commands Orientation Level: (oriented except to why she was here ("i'm sick)) Following Commands: Follows one step commands inconsistently (due to [facility] and inattention) Safety/Judgement: Decreased awareness of safety, Decreased awareness of deficits Problem Solving: Difficulty sequencing, Requires verbal cues, Requires tactile cues General  Comments: Some noted improvements in  attention this session, but continues to demonstrate impulsivity     Blood pressure 150/55, pulse 58, temperature 97.9 F (36.6 C), temperature source Oral, resp. rate 18, height 5\' 3"  (1.6 m), weight 49.442 kg (109 lb), SpO2 99 %. Physical Exam   General: No acute distress Mood and affect are appropriate Heart: Regular rate and rhythm no rubs murmurs or extra sounds Lungs: Clear to auscultation, breathing unlabored, no rales or wheezes Abdomen: Positive bowel sounds, soft nontender to palpation, nondistended Extremities: No clubbing, cyanosis, or edema Skin: No evidence of breakdown, no evidence of rash Neurologic: Cranial nerves II through XII intact,  Sensory exam normal sensation to light touch and proprioception in bilateral upper and lower extremities Cerebellar exam normal finger to nose to finger as well as heel to shin in bilateral upper and lower extremities Musculoskeletal: Full passive range of motion in all 4 extremities. No joint swelling Left lower extremity 3 minus at the hip flexor and extensor ankle dorsiflexor and plantar flexor 4+/5 in the right deltoid, biceps, triceps, grip, hip flexor, knee extensor, ankle dorsiflexion plantar flexor Skin: Skin is warm and dry.     Lab Results Last 48 Hours    No results found for this or any previous visit (from the past 48 hour(s)).    Imaging Results (Last 48 hours)    No results found.       Medical Problem List and Plan: 1. Functional deficits secondary to Right MCA infarct likely thrombotic 2. DVT Prophylaxis/Anticoagulation: Pharmaceutical: Lovenox 3. Pain Management: tylenol prn for HA. Will add heat and muscle relaxer for neck pain due to right neglect.  4. Mood: LCSW to follow for evaluation and support.  5. Neuropsych: This patient is capable of making decisions on her own behalf. 6. Skin/Wound Care: Encourage adequate nutrition and hydration status. Routine pressure  relief measures.  7. Fluids/Electrolytes/Nutrition: Monitor I/O. Check lytes in am. 8. HTN: Monitor BP tid. Continue metoprolol and Norvasc.  9. Hypothyroid: Continue supplement.       Post Admission Physician Evaluation: 1. Functional deficits secondary to right MCA infarct likely thrombotic 2. Patient is admitted to receive collaborative, interdisciplinary care between the physiatrist, rehab nursing staff, and therapy team. 3. Patient's level of medical complexity and substantial therapy needs in context of that medical necessity cannot be provided at a lesser intensity of care such as a SNF. 4. Patient has experienced substantial functional loss from his/her baseline which was documented above under the "Functional History" and "Functional Status" headings. Judging by the patient's diagnosis, physical exam, and functional history, the patient has potential for functional progress which will result in measurable gains while on inpatient rehab. These gains will be of substantial and practical use upon discharge in facilitating mobility and [employer]-care at the household level. 5. Physiatrist will provide 24 hour management of medical needs as well as oversight of the therapy plan/treatment and provide guidance as appropriate regarding the interaction of the two. 6. 24 hour rehab nursing will assist with bladder management, bowel management, safety, skin/wound care, disease management, medication administration, pain management and patient education and help integrate therapy concepts, techniques,education, etc. 7. PT will assess and treat for/with: pre gait, gait training, endurance , safety, equipment, neuromuscular re education. Goals are: Min A. 8. OT will assess and treat for/with: ADLs, Cognitive perceptual skills, Neuromuscular re education, safety, endurance, equipment. Goals are: Min A. Therapy may proceed with showering this patient. 9. SLP will assess and treat for/with: eval  cognition, med management, left neglect.  Goals are: Sup. 10. Case Management and Social Worker will assess and treat for psychological issues and discharge planning. 11. Team conference will be held weekly to assess progress toward goals and to determine barriers to discharge. 12. Patient will receive at least 3 hours of therapy per day at least 5 days per week. 13. ELOS: 14-18d  14. Prognosis: good     Erick Colace M.D. [facility] FAAPM&R (Sports Med, Neuromuscular Med) Diplomate Am Board of Electrodiagnostic Med  03/26/2015

## 2015-03-26 NOTE — Progress Notes (Addendum)
Speech Language Pathology Treatment: Cognitive-Linquistic  Patient Details Name: Leah Figueroa MRN: [id number] DOB: 05-02-1923 Today's Date: 03/26/2015 Time: 4098-1191 SLP Time Calculation (min) (ACUTE ONLY): 44 min  Assessment / Plan / Recommendation Clinical Impression  Pt seen for skilled cognitive linguistic treatment addressing problem solving, awareness and attention skills.  Pt benefited from moderate verbal, visual, tactile cues to verbalize cognitive changes.   Pt benefited from moderate cues to locate call bell to notify RN of needs - even with call bell sitting on her lap.  Return demonstration completed, however pt will benefit from reinforcement.    Awareness to vision deficits even with planned difficulty was decreased to max cues - pt did independently compensate well by reviewing entire paper to locate (large) pictures named by SLP.  She defaults to right side initially but modified to left independently.     Daughter arrived during session and reports no changes in pt's speech intelligibility - pt confirms.   Prior to current cva, pt was independent - living in her own home and managing home finances, medications, etc.  She reports daughter goes to appointments with her as she is her "ears".  Pt is significantly HOH and does not alert speaker to difficulty.  Advised her and daughter to importance to share this information.  SLP posted signs in the room to alert hospital personnel to pt's hearing loss.   Recommend follow up at CIR to address cognitive linguistic skills - to maximize independence and decrease caregiver burden.  Pt and daughter Leah Figueroa agreeable to goals and follow up SLP.  Educated pt and daughter to help pt to focus attention to left.    HPI Other Pertinent Information: 81 y.o. female presenting with acute onset left sided weakness, LHH and neglect. Symptoms initially improved to the point that the patient was not a tPA candidate but patient had worsening and with  NIHSS of 6, tPA was considered. Head CT independently reviewed and showed small vessel ischemic changes but no acute changes.Pt administered tPA. Pt falied Rn stroke swallow due to a history of dysphagia. Pt had OP MBS on 05/10/14 showing mild oropharyngeal dysphagia without aspiratin or penetration, including slight delay in initiation, mild residuals. Residuals improved with chin tuck. Pt also found to have esopahgeal dysmotility during esophagram.    Pertinent Vitals Pain Assessment:  (discomfort back and neck, RN made aware) Pain Intervention(s): Relaxation;Heat applied;Limited activity within patient's tolerance  SLP Plan  Continue with current plan of care    Recommendations  CIR              Follow up Recommendations: Inpatient Rehab Plan: Continue with current plan of care    GO    Donavan Burnet, MS [facility] SLP [phone]   Session interrupted by neuro MD visit -10 minutes.

## 2015-03-26 NOTE — Progress Notes (Signed)
Inpatient Rehabilitation    I met with the patient and her daughter Leah Figueroa to discuss pt.'s post acute rehab needs and options.  I educated them on IP Rehab purposes and answered their questions.  They prefer to proceed with IP Rehab.  I have discussed with Leah Sabin, NP and Dr. Leonie Figueroa who give medical clearance for admission today.  I have notified Leah Figueroa, CM and Leah Figueroa, SW as well as pt's RN Leah Figueroa.  I will arrange for admission today.  Please call if questions.  [facility] Admissions Coordinator Cell [phone] Office [phone]

## 2015-03-26 NOTE — Progress Notes (Signed)
Patient arrived to [phone] with daughter at bedside. Patient and patient's daughter given admission packet, the use of the call bell explained, and safety plan reviewed. Both patient, and patient's daughter verbalized understanding. On skin assessment buttocks noted to be red, epbc and microguard powder applied. Bruised area noted on (L) hip, (L) knee, and bilateral UE. Will continue with plan of care.

## 2015-03-26 NOTE — Progress Notes (Signed)
STROKE TEAM PROGRESS NOTE   HISTORY Leah Figueroa is a 81 y.o. female who was at home with family. While speaking with family was noted to start to lean to the left. The patient then attempted to stand and fell to the left. She was seated but this happened again. EMS was called at that time and the patient was brought in for evaluation. Symptoms initially improved but after presentation patient again became weak on the left. NIHSS of 6. Code stroke called at that time. Patient on ASA at home.  Patient remains functional. Lives alone. Drives. Works as a Catering manager.   Date last known well: Date: 03/22/2015 Time last known well: Time: 22:00 tPA Given: Yes   SUBJECTIVE (INTERVAL HISTORY) Daughter just at bedside. Patient up in chair at the bedside. She is talkative. Pt lived alone PTA. Daughter feels she is more disoriented today.   OBJECTIVE Temp:  [97.6 F (36.4 C)-98.7 F (37.1 C)] 98.7 F (37.1 C) (08/10 0929) Pulse Rate:  [61-65] 64 (08/10 0929) Cardiac Rhythm:  [-] Normal sinus rhythm (08/10 0747) Resp:  [16-20] 20 (08/10 0929) BP: (125-155)/(58-84) 152/67 mmHg (08/10 0929) SpO2:  [96 %-98 %] 98 % (08/10 0929)  No results for input(s): GLUCAP in the last 168 hours.  Recent Labs Lab 03/23/15 0019 03/23/15 0026  NA 139 142  K 3.8 3.7  CL 106 106  CO2 24  --   GLUCOSE 155* 154*  BUN 12 16  CREATININE 0.67 0.60  CALCIUM 9.2  --     Recent Labs Lab 03/23/15 0019  AST 29  ALT 12*  ALKPHOS 64  BILITOT 0.5  PROT 6.8  ALBUMIN 3.5    Recent Labs Lab 03/23/15 0019 03/23/15 0026  WBC 5.9  --   NEUTROABS 3.7  --   HGB 9.9* 10.9*  HCT 32.3* 32.0*  MCV 80.3  --   PLT 212  --    No results for input(s): CKTOTAL, CKMB, CKMBINDEX, TROPONINI in the last 168 hours. No results for input(s): LABPROT, INR in the last 72 hours. No results for input(s): COLORURINE, LABSPEC, PHURINE, GLUCOSEU, HGBUR, BILIRUBINUR, KETONESUR, PROTEINUR, UROBILINOGEN, NITRITE,  LEUKOCYTESUR in the last 72 hours.  Invalid input(s): APPERANCEUR     Component Value Date/Time   CHOL 152 03/23/2015 0628   TRIG 54 03/23/2015 0628   HDL 47 03/23/2015 0628   CHOLHDL 3.2 03/23/2015 0628   VLDL 11 03/23/2015 0628   LDLCALC 94 03/23/2015 0628   Lab Results  Component Value Date   HGBA1C 5.8* 03/23/2015      Component Value Date/Time   LABOPIA NONE DETECTED 03/23/2015 0257   COCAINSCRNUR NONE DETECTED 03/23/2015 0257   LABBENZ NONE DETECTED 03/23/2015 0257   AMPHETMU NONE DETECTED 03/23/2015 0257   THCU NONE DETECTED 03/23/2015 0257   LABBARB NONE DETECTED 03/23/2015 0257     Recent Labs Lab 03/23/15 0019  ETH <5   CT of the head without contrast - 1. No acute intracranial pathology seen on CT. 2. Moderate cortical volume loss and diffuse small vessel ischemic microangiopathy  MRI  of the head - Acute RIGHT frontoparietal lobe infarct, MCA territory. Subcentimeter acute RIGHT pontine infarct..Severe white matter changes compatible with chronic small vessel ischemic disease. Scattered susceptibility artifacts suggest sequelae of chronic hypertension without lobar hematoma  MRA HEAD: No acute large vessel occlusion. dolicoectatic intracranial vessels compatible chronic hypertension withsuperimposed moderate luminal irregularity commonly seen with atherosclerosis.Focal high-grade stenosis LEFT A2 segment.  Carotid Duplex No evidence of a  significant stenosis noted in bilateral ICAs 1-39%. Bilateral vertebral arteries demonstrate normal antegrade flow.  2D Echocardiogram  - Left ventricle: The cavity size was normal. There was moderateconcentric hypertrophy. Systolic function was normal. Theestimated ejection fraction was in the range of 60% to 65%. Thereis akinesis of the apical myocardium. Doppler parameters areconsistent with abnormal left ventricular relaxation (grade 1diastolic dysfunction). Mitral valve: Moderately calcified annulus.  Impressions:  No  cardiac source of emboli was indentified.   PHYSICAL EXAM Frail elderly caucasian lady not in distress. . Afebrile. Head is nontraumatic. Neck is supple without bruit.    Cardiac exam no murmur or gallop. Lungs are clear to auscultation. Distal pulses are well felt.  Neurological Exam :  Awake alert oriented to time place and person. Speech is clear without dysarthria or aphasia. Right gaze preference but able to look to the left all the way. Pupils equal reactive. Fundi were not visualized. Dense left homonymous hemianopsia. No facial weakness. Tongue midline. Motor system exam revealed mild left upper extremity drift with significant weakness of left grip, wrist and intrinsic hand muscles. 3/5 proximal strength in the left upper extremity. No lower extremity drift on the left but mild weakness of left hip flexor and ankle dorsiflexors. Normal strength on the right. Mild diminished left hemibody sensation. Mild left hemi-inattention. Able to recognize left side of her body. The tendon reflexes are normal on the right depressed on the left. Right plantar downgoing left is equivocal. Mild impaired left finger-to-nose coordination but knee to heel coordination is intact. Gait was not tested. NIH stroke scale score 6   ASSESSMENT/PLAN Leah Figueroa is a 81 y.o. female with history of coronary artery disease with previous MI, hypertension, and hyperlipidemia presenting with left hemiparesis. She did receive IV t-PA 44 mg on 03/23/2015 at 0100 hrs.  Stroke:  Non-dominant Right MCA infarct s/p IV tPA. Stroke etiology unknown.  Resultant  left hemiparesis, left hemisensory deficit, left visual field deficits.  MRI  Acute RIGHT frontoparietal lobe infarct, MCA territory. Subcentimeter acute RIGHT pontine infarct.  MRA   No acute large vessel occlusion.  Carotid Doppler  No significant stenosis   2D Echo  No source of embolus. Thereis akinesis of the apical myocardium.  LDL 94  HgbA1c  5.8%  SCDs for VTE prophylaxis Diet regular Room service appropriate?: Yes; Fluid consistency:: Thin  aspirin 81 mg orally every day prior to admission, now on aspirin 81 mg orally every day   Given age, will not do further stroke etiology workup.  Ongoing aggressive stroke risk factor management  Therapy recommendations: CIR  Disposition:  Pending. Medically ready for discharge once bed is available (lived alone PTA)  Hypertension  Home meds: Metoprolol  Stable  Hyperlipidemia  Home meds:  Mevacor 20 mg daily resumed in hospital  LDL 94, goal < 70  Increases Mevacor to 40 mg daily (using pravachol 40 in hospital)  Continue statin at discharge  Elevated glucose levels  150s  HgbA1c 5.8, at goal < 7.0  No history of diabetes  Other Stroke Risk Factors  Advanced age  Coronary artery disease s/p MI  Other Active Problems  Anemia, stable  Hospital day # 3  BIBY,SHARON  [facility] See Amion for Pager information 03/26/2015 10:11 AM  I have personally examined this patient, reviewed notes, independently viewed imaging studies, participated in medical decision making and plan of care. I have made any additions or clarifications directly to the above note. Agree with note above.  Delia Heady, MD Medical Director [facility] Pager: [phone] 03/26/2015 11:15 AM    To contact Stroke Continuity provider, please refer to [email or web address]. After hours, contact General Neurology

## 2015-03-26 NOTE — Progress Notes (Signed)
Stroke Discharge Summary  Patient ID: Leah Figueroa   MRN: [id number]      DOB: 1923-05-21  Date of Admission: 03/22/2015 Date of Discharge: 03/26/2015  Attending Physician:  Micki Riley, MD, Stroke MD  Consulting Physician(s):    Claudette Laws, MD (Physical Medicine & Rehabtilitation)  Patient's PCP:  No PCP Per Patient  Discharge Diagnoses:  Principal Problem:   Embolic stroke -  Non-dominant Right MCA infarct s/p IV tPA. Stroke etiology unknown. Active Problems:   Essential hypertension   Anemia   Left hemiplegia   Homonymous hemianopia   Hyperlipidemia LDL goal <70   Hyperglycemia   CAD  Past Medical History  Diagnosis Date  . Other specified personal history presenting hazards to health(V15.89)   . Personal history of arthritis   . Unspecified personal history presenting hazards to health   . Cramp of limb   . Acute myocardial infarction of other anterior wall, episode of care unspecified   . Personal history of malignant neoplasm of breast   . Unspecified essential hypertension   . Personal history of other diseases of digestive system   . Unspecified hypothyroidism   . Other and unspecified hyperlipidemia    Past Surgical History  Procedure Laterality Date  . Right thyroid lobectomy    . Cataract extraction      left-2006, right 2011  . Intrathoracic goiter resection    . Cholecystectomy    . Lumpectomy for breast cancer  80's    left  . Back surgery      Medications to be continued on Rehab . amLODipine  2.5 mg Oral Daily  . aspirin EC  81 mg Oral q morning - 10a  . enoxaparin (LOVENOX) injection  40 mg Subcutaneous Q24H  . levothyroxine  25 mcg Oral QAC breakfast  . metoprolol tartrate  50 mg Oral BID  . pantoprazole  40 mg Oral QHS  . pravastatin  40 mg Oral q1800    LABORATORY STUDIES CBC    Component Value Date/Time   WBC 5.9 03/23/2015 0019   RBC 4.02 03/23/2015 0019   HGB 10.9* 03/23/2015 0026   HCT 32.0* 03/23/2015 0026   PLT  212 03/23/2015 0019   MCV 80.3 03/23/2015 0019   MCH 24.6* 03/23/2015 0019   MCHC 30.7 03/23/2015 0019   RDW 16.6* 03/23/2015 0019   LYMPHSABS 1.4 03/23/2015 0019   MONOABS 0.6 03/23/2015 0019   EOSABS 0.1 03/23/2015 0019   BASOSABS 0.0 03/23/2015 0019   CMP    Component Value Date/Time   NA 142 03/23/2015 0026   K 3.7 03/23/2015 0026   CL 106 03/23/2015 0026   CO2 24 03/23/2015 0019   GLUCOSE 154* 03/23/2015 0026   BUN 16 03/23/2015 0026   CREATININE 0.60 03/23/2015 0026   CALCIUM 9.2 03/23/2015 0019   PROT 6.8 03/23/2015 0019   ALBUMIN 3.5 03/23/2015 0019   AST 29 03/23/2015 0019   ALT 12* 03/23/2015 0019   ALKPHOS 64 03/23/2015 0019   BILITOT 0.5 03/23/2015 0019   GFRNONAA >60 03/23/2015 0019   GFRAA >60 03/23/2015 0019   COAGS Lab Results  Component Value Date   INR 1.05 03/23/2015   Lipid Panel    Component Value Date/Time   CHOL 152 03/23/2015 0628   TRIG 54 03/23/2015 0628   HDL 47 03/23/2015 0628   CHOLHDL 3.2 03/23/2015 0628   VLDL 11 03/23/2015 0628   LDLCALC 94 03/23/2015 0628   HgbA1C  Lab  Results  Component Value Date   HGBA1C 5.8* 03/23/2015   Cardiac Panel (last 3 results) No results for input(s): CKTOTAL, CKMB, TROPONINI, RELINDX in the last 72 hours. Urinalysis    Component Value Date/Time   COLORURINE YELLOW 03/23/2015 0257   APPEARANCEUR CLEAR 03/23/2015 0257   LABSPEC 1.008 03/23/2015 0257   PHURINE 6.0 03/23/2015 0257   GLUCOSEU NEGATIVE 03/23/2015 0257   HGBUR SMALL* 03/23/2015 0257   BILIRUBINUR NEGATIVE 03/23/2015 0257   KETONESUR NEGATIVE 03/23/2015 0257   PROTEINUR NEGATIVE 03/23/2015 0257   UROBILINOGEN 1.0 03/23/2015 0257   NITRITE NEGATIVE 03/23/2015 0257   LEUKOCYTESUR SMALL* 03/23/2015 0257   Urine Drug Screen     Component Value Date/Time   LABOPIA NONE DETECTED 03/23/2015 0257   COCAINSCRNUR NONE DETECTED 03/23/2015 0257   LABBENZ NONE DETECTED 03/23/2015 0257   AMPHETMU NONE DETECTED 03/23/2015 0257   THCU  NONE DETECTED 03/23/2015 0257   LABBARB NONE DETECTED 03/23/2015 0257    Alcohol Level    Component Value Date/Time   ETH <5 03/23/2015 0019     SIGNIFICANT DIAGNOSTIC STUDIES CT of the head without contrast - 1. No acute intracranial pathology seen on CT. 2. Moderate cortical volume loss and diffuse small vessel ischemic microangiopathy  MRI of the head - Acute RIGHT frontoparietal lobe infarct, MCA territory. Subcentimeter acute RIGHT pontine infarct..Severe white matter changes compatible with chronic small vessel ischemic disease. Scattered susceptibility artifacts suggest sequelae of chronic hypertension without lobar hematoma  MRA HEAD: No acute large vessel occlusion. dolicoectatic intracranial vessels compatible chronic hypertension withsuperimposed moderate luminal irregularity commonly seen with atherosclerosis.Focal high-grade stenosis LEFT A2 segment.  Carotid Duplex No evidence of a significant stenosis noted in bilateral ICAs 1-39%. Bilateral vertebral arteries demonstrate normal antegrade flow.  2D Echocardiogram - Left ventricle: The cavity size was normal. There was moderateconcentric hypertrophy. Systolic function was normal. Theestimated ejection fraction was in the range of 60% to 65%. Thereis akinesis of the apical myocardium. Doppler parameters areconsistent with abnormal left ventricular relaxation (grade 1diastolic dysfunction). Mitral valve: Moderately calcified annulus.  Impressions: No cardiac source of emboli was indentified.     HISTORY OF PRESENT ILLNES Leah Figueroa is a 81 y.o. female who was at home with family. While speaking with family was noted to start to lean to the left. The patient then attempted to stand and fell to the left. She was seated but this happened again. EMS was called at that time and the patient was brought in for evaluation. Symptoms initially improved but after presentation patient again became weak on the left. NIHSS of  6. Code stroke called at that time. Patient on ASA at home. Patient has been functional. Lives alone. Drives. Works as a Catering manager.She was last known well 03/22/2015 at 22:00. She received IV t-PA 44 mg on 03/23/2015 at 0100 hrs. She was admitted for further evaluation and treatment.    HOSPITAL COURSE Ms. Leah Figueroa is a 81 y.o. female with history of coronary artery disease with previous MI, hypertension, and hyperlipidemia presenting with left hemiparesis. She did receive IV t-PA 44 mg on 03/23/2015 at 0100 hrs.  Stroke: Non-dominant Right MCA infarct s/p IV tPA. Stroke etiology unknown.  Resultant left hemiparesis, left hemisensory deficit, left visual field deficits.  MRI Acute RIGHT frontoparietal lobe infarct, MCA territory. Subcentimeter acute RIGHT pontine infarct.  MRA No acute large vessel occlusion.  Carotid Doppler No significant stenosis  2D Echo No source of embolus. Thereis akinesis of the apical myocardium.  LDL 94  HgbA1c 5.8%  aspirin 81 mg orally every day prior to admission, continued aspirin 81 mg orally every day post tPA. Change to plavix for secondary stroke prevention.  Given age, will not do further stroke etiology workup.  Ongoing aggressive stroke risk factor management  Therapy recommendations: CIR  Disposition: inpatient rehab. Medically ready for discharge once bed is available (lived alone PTA)  Hypertension  Home meds: Metoprolol  Stable  Hyperlipidemia  Home meds: Mevacor 20 mg daily resumed in hospital  LDL 94, goal < 70  Increases Mevacor to 40 mg daily (using pravachol 40 in hospital)  Continue statin at discharge  Elevated glucose levels  150s  HgbA1c 5.8, at goal < 7.0  No history of diabetes  Other Stroke Risk Factors  Advanced age  Coronary artery disease s/p MI  Other Active Problems  Anemia, stable   DISCHARGE EXAM Blood pressure 152/67, pulse 64, temperature 98.7 F (37.1 C),  temperature source Oral, resp. rate 20, height 5\' 3"  (1.6 m), weight 49.442 kg (109 lb), SpO2 98 %. Frail elderly caucasian lady not in distress. . Afebrile. Head is nontraumatic. Neck is supple without bruit. Cardiac exam no murmur or gallop. Lungs are clear to auscultation. Distal pulses are well felt.  Neurological Exam :  Awake alert oriented to time place and person. Speech is clear without dysarthria or aphasia. Right gaze preference but able to look to the left all the way. Pupils equal reactive. Fundi were not visualized. Dense left homonymous hemianopsia. No facial weakness. Tongue midline. Motor system exam revealed mild left upper extremity drift with significant weakness of left grip, wrist and intrinsic hand muscles. 3/5 proximal strength in the left upper extremity. No lower extremity drift on the left but mild weakness of left hip flexor and ankle dorsiflexors. Normal strength on the right. Mild diminished left hemibody sensation. Mild left hemi-inattention. Able to recognize left side of her body. The tendon reflexes are normal on the right depressed on the left. Right plantar downgoing left is equivocal. Mild impaired left finger-to-nose coordination but knee to heel coordination is intact. Gait was not tested. NIH stroke scale score 6  Discharge Diet  Diet regular Room service appropriate?: Yes; Fluid consistency:: Thin liquids  DISCHARGE PLAN  Disposition:  Transfer to [facility] for ongoing PT, OT and ST  clopidogrel 75 mg orally every day for secondary stroke prevention.  Recommend ongoing risk factor control by Primary Care Physician at time of discharge from inpatient rehabilitation.  Follow-up No PCP Per Patient in 2 weeks following discharge from rehab.  Follow-up with Dr. Delia Heady, Stroke Clinic in 2 months.   35 minutes were spent preparing discharge.  Rhoderick Moody [facility] See Amion for Pager information 03/26/2015 11:58 AM    I have personally examined this patient, reviewed notes, independently viewed imaging studies, participated in medical decision making and plan of care. I have made any additions or clarifications directly to the above note. Agree with note above.    Delia Heady, MD Medical Director [facility] Pager: [phone] 03/26/2015 11:11 PM

## 2015-03-26 NOTE — PMR Pre-admission (Signed)
PMR Admission Coordinator Pre-Admission Assessment  Patient: Leah Figueroa is an 81 y.o., female MRN: [id number] DOB: 11-10-1922 Height: 5\' 3"  (160 cm) Weight: 49.442 kg (109 lb)              Insurance Information HMO:    PPO:      PCP:      IPA:      80/20:      OTHER:  PRIMARY:  Medicare A and B      Policy#:  [id number]      Subscriber:  [place] CM Name:       Phone#:      Fax#:  Pre-Cert#:       Employer: retired Benefits:  Phone #:      Name:  Eff. Date: 11/14/1989     Deduct:  $1288      Out of Pocket Max:  no      Life Max:  no CIR:  100%      SNF:  100% first 20 days Outpatient:  80%     Co-Pay:  20% Home Health:  100%      Co-Pay:   DME:  80%     Co-Pay:  20% Providers:  Pt. choice SECONDARY:  [employer] supplement      Policy#: [id number]      Subscriber:  [place] CM Name:       Phone#:      Fax#:  Pre-Cert#:       Employer:  Benefits:  Phone #:      Name:  Eff. Date:      Deduct:       Out of Pocket Max:       Life Max:  CIR:       SNF:  Outpatient:      Co-Pay:  Home Health:       Co-Pay:  DME:      Co-Pay:   Medicaid Application Date:       Case Manager:  Disability Application Date:       Case Worker:   Emergency Contact Information Contact Information    Name Relation Home Work [place] Daughter [phone]  [phone]     Current Medical History  Patient Admitting Diagnosis: Right MCA distribution infarct with left hemiparesis, left neglect, left homonymous hemianopsia, Decreased awareness of deficits History of Present Illness: Leah Figueroa is a 81 y.o. female with history of HTN, HA, who was at home with family when she started to lean to the left then fell with attempts at waking. She was evaluated in ED on 08/06 and developed left sided weakness after presentation and received t-PA. MRI/MRA brain done revealing Right MCA infarct affecting frontoparietal lobe, subcentimeter right pontine infarct, severe white mater changes due to SVD and focal high grade  stenosis left A2 segment. 2D echo with EF 60-65% with apical akinesis, moderate concentric LVH and moderately calcified MV. Carotid dopplers without significant ICA stenosis. Dr. Pearlean Brownie consulted and work up ongoing to determine etiology of R-MCA infarct. ST evaluation revealed confusion with complex verbal instructions and poor awareness of deficits. Patient with resultant left sided weakness with sensory deficits. Left visual field deficits with left inattention as well as mild cognitive deficits. PT/OT evaluations done and CIR recommended by MD and Rehab team.  Pt. Admitted for CIR 03/26/15 Total: 6 [facility]    Past Medical History  Past Medical History  Diagnosis Date  . Other specified personal history presenting hazards to health(V15.89)   .  Personal history of arthritis   . Unspecified personal history presenting hazards to health   . Cramp of limb   . Acute myocardial infarction of other anterior wall, episode of care unspecified   . Personal history of malignant neoplasm of breast   . Unspecified essential hypertension   . Personal history of other diseases of digestive system   . Unspecified hypothyroidism   . Other and unspecified hyperlipidemia     Family History  family history includes Breast cancer in her mother; Colon cancer in her mother; Coronary artery disease in her brother.  Prior Rehab/Hospitalizations:  Has the patient had major surgery during 100 days prior to admission? No; no prior rehab  Current Medications    Current facility-administered medications:  .  0.9 %  sodium chloride infusion, , Intravenous, Continuous, Thana Farr, MD, Last Rate: 50 mL/hr at 03/25/15 2013 .  acetaminophen (TYLENOL) tablet 650 mg, 650 mg, Oral, Q4H PRN, 650 mg at 03/26/15 0056 **OR** acetaminophen (TYLENOL) suppository 650 mg, 650 mg, Rectal, Q4H PRN, Thana Farr, MD .  amLODipine (NORVASC) tablet 2.5 mg, 2.5 mg, Oral, Daily, Ramond Marrow, DO, 2.5 mg at 03/26/15 1017 .   [START ON 03/27/2015] clopidogrel (PLAVIX) tablet 75 mg, 75 mg, Oral, Daily, Layne Benton, NP .  enoxaparin (LOVENOX) injection 40 mg, 40 mg, Subcutaneous, Q24H, Micki Riley, MD, 40 mg at 03/26/15 1017 .  labetalol (NORMODYNE,TRANDATE) injection 10 mg, 10 mg, Intravenous, Q10 min PRN, Thana Farr, MD, 10 mg at 03/23/15 0335 .  levothyroxine (SYNTHROID, LEVOTHROID) tablet 25 mcg, 25 mcg, Oral, QAC breakfast, Layne Benton, NP, 25 mcg at 03/26/15 0741 .  metoprolol (LOPRESSOR) tablet 50 mg, 50 mg, Oral, BID, Althea Grimmer Sumner, DO, 50 mg at 03/26/15 1017 .  naproxen (NAPROSYN) tablet 250 mg, 250 mg, Oral, TID PRN, Micki Riley, MD, 250 mg at 03/26/15 1110 .  pantoprazole (PROTONIX) EC tablet 40 mg, 40 mg, Oral, QHS, Rejeana Brock, MD, 40 mg at 03/25/15 2200 .  pravastatin (PRAVACHOL) tablet 40 mg, 40 mg, Oral, q1800, Layne Benton, NP, 40 mg at 03/25/15 1721 .  senna-docusate (Senokot-S) tablet 1 tablet, 1 tablet, Oral, QHS PRN, Thana Farr, MD  Patients Current Diet: Diet regular Room service appropriate?: Yes; Fluid consistency:: Thin  Precautions / Restrictions Precautions Precautions: Fall Restrictions Weight Bearing Restrictions: No   Has the patient had 2 or more falls or a fall with injury in the past year?No  Prior Activity Level Community (5-7x/wk): Per daughter, pt. 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  Pt. did her own shopping as well  Journalist, newspaper / Equipment Home Assistive Devices/Equipment: Hearing aid Home Equipment: None  Prior Device Use: Indicate devices/aids used by the patient prior to current illness, exacerbation or injury? None of the above; did not use an assistive device for mobility; uses bilateral hearing aids due to [facility]  Prior Functional Level Prior Function Level of Independence: Independent Comments: including driving and still working as  a Personal assistant Care: Did the patient need help bathing, dressing, using the toilet or eating?  Independent  Indoor Mobility: Did the patient need assistance with walking from room to room (with or without device)? Independent  Stairs: Did the patient need assistance with internal or external stairs (with or without device)? Independent  Functional Cognition: Did the patient need help planning regular tasks such as  shopping or remembering to take medications? Independent  Current Functional Level Cognition  Arousal/Alertness: Awake/alert Overall Cognitive Status: Impaired/Different from baseline Orientation Level: Oriented X4 Following Commands: Follows one step commands inconsistently (due to [facility] and inattention) Safety/Judgement: Decreased awareness of safety, Decreased awareness of deficits General Comments: Some noted improvements in attention this session, but continues to demonstrate impulsivity  Attention: Sustained Sustained Attention: Appears intact Memory: Impaired Memory Impairment: Prospective memory Awareness: Impaired Awareness Impairment: Intellectual impairment, Emergent impairment, Anticipatory impairment Problem Solving: Impaired Problem Solving Impairment: Functional complex Safety/Judgment: Impaired    Extremity Assessment (includes Sensation/Coordination)  Upper Extremity Assessment: LUE deficits/detail LUE Deficits / Details: decreased AROM, coordination, sensation, apraxic, ataxic--attmpts to use LUE Coordination: decreased fine motor, decreased gross motor  Lower Extremity Assessment: LLE deficits/detail LLE Deficits / Details: LLE weakness upon assessment, asymetrical compared to RLE, decreased dorsiflexion  during functional tasks LLE Sensation: decreased proprioception LLE Coordination: decreased fine motor, decreased gross motor    ADLs  Overall ADL's : Needs assistance/impaired Eating/Feeding: Minimal assistance, Sitting, Cueing for  compensatory techinques Eating/Feeding Details (indicate cue type and reason): to find items on her left and to switch to using her right UE when her left UE is not able to do what she is trying to do. Grooming: Wash/dry face, Wash/dry hands, Oral care, Minimal assistance, Cueing for compensatory techniques, Sitting Grooming Details (indicate cue type and reason): for finding items that she drops (right and left) and finding items on her left Upper Body Bathing: Minimal assitance, Sitting, Cueing for compensatory techniques Upper Body Bathing Details (indicate cue type and reason): and cuing to come back to midline after she starts leaning to the left Lower Body Bathing: Moderate assistance (with min A sit<>stand, but Mod A to maintain standing with dynamic activity) Upper Body Dressing : Moderate assistance, Sitting Lower Body Dressing: Moderate assistance (with min A sit<>stand, but Mod A to maintain standing with dynamic activity) Toilet Transfer: Minimal assistance, +2 for safety/equipment, Ambulation, Regular Toilet, Grab bars Toileting- Clothing Manipulation and Hygiene: Maximal assistance (with min A sit<>stand, but Mod A to maintain standing with dynamic activity)    Mobility  Overal bed mobility: Needs Assistance Bed Mobility: Supine to Sit Supine to sit: Min assist General bed mobility comments: with mod VCs for sequencing and to find upper bed rail to her left to help her get to EOB    Transfers  Overall transfer level: Needs assistance Equipment used: 1 person hand held assist Transfers: Sit to/from Stand Sit to Stand: Min assist General transfer comment: Min assist for stability and power up to standing, Cues for attention to LUE, cues for posture and faciliation of stance    Ambulation / Gait / Stairs / Wheelchair Mobility  Ambulation/Gait Ambulation/Gait assistance: Mod assist Ambulation Distance (Feet): 15 Feet (x2) Assistive device: 1 person hand held assist Gait  Pattern/deviations: Step-to pattern, Decreased step length - left, Decreased dorsiflexion - left, Shuffle, Ataxic, Drifts right/left, Narrow base of support General Gait Details: patient with improvements noted in LLE placement and stride this session, but as patient fatigued increased ataxia noted. Patient required manual cues to faciliate weight shift and increased physical assist for stability. Gait velocity: decreased Gait velocity interpretation: <1.8 ft/sec, indicative of risk for recurrent falls    Posture / Balance Dynamic Sitting Balance Sitting balance - Comments: LLE noted in sitting Balance Overall balance assessment: Needs assistance Sitting-balance support: Feet supported Sitting balance-[name] Scale: Poor Sitting balance - Comments: LLE noted in sitting Standing balance support: Bilateral upper  extremity supported Standing balance-[name] Scale: Poor (LUE unable to maintain support without increased assist)    Special needs/care consideration BiPAP/CPAP  no Continuous Drip IV 0.9% sodium chloride infusion Intravenous, 50 mL/hr, Continuous  Oxygen   no       Skin  Multiple bruises noted upper back, left arm, right forearm, left knee ; daughter reports she noted redness on pt's "bottom"  (gluteal crease)   yesterday                           Bowel mgmt:  Per pt., she has had BMs after most meals since she has been hospitalized, most recently 03/25/14 Bladder mgmt: continent but has occasional accidents due to staff delay in getting to her on time Diabetic mgmt A1c 5.8     Previous Home Environment Living Arrangements: Alone  Lives With: Alone Available Help at Discharge: Other (Comment) (daughter is planning to arrange for 24/7 assist/(S)) Type of Home: House Home Care Services: No  Discharge Living Setting Plans for Discharge Living Setting: Other (Comment), Patient's home (TBD if pt. will go back to her home) Type of Home at Discharge: House Discharge Home Layout: One  level Discharge Home Access: Stairs to enter Entrance Stairs-Rails: None Entrance Stairs-Number of Steps: threshold Discharge Bathroom Shower/Tub: Walk-in shower, Tub/shower unit Discharge Bathroom Toilet: Standard Discharge Bathroom Accessibility: Yes How Accessible: Accessible via walker Does the patient have any problems obtaining your medications?: No  Social/Family/Support Systems Patient Roles: Parent, Other (Comment) (has one daughter and one grandchild, age 17) Anticipated Caregiver: Marcia Brash, daughter, [phone] Ability/Limitations of Caregiver: Daughter [name] and her husband both work.  [name] recognizes and states that her mom can no longer be home alone and that she will have to arrange for 24 hour care.  We discussed that if she brought someone in the home, this would be an out of pocket expense, but her insurance would cover for home therapies if those are needed.  Daughter said "I problably shouldn't have let things go this long (without her mom having any assist prior to this event).  Daughter confirms that while she does not have a game plan in place for 24 hour care currently, and she intends to have that arranged prior to her Mom's DC from rehab Caregiver Availability: 24/7 (pending daughter's arrangements) Discharge Plan Discussed with Primary Caregiver: Yes Is Caregiver In Agreement with Plan?: Yes Does Caregiver/Family have Issues with Lodging/Transportation while Pt is in Rehab?: No   Goals/Additional Needs Patient/Family Goal for Rehab: supervision for PT/OT/SLP Expected length of stay: 14-18 days Cultural Considerations: "[employer]" Dietary Needs: regular diet with thin liquids Equipment Needs: TBA Pt/Family Agrees to Admission and willing to participate: Yes Program Orientation Provided & Reviewed with Pt/Caregiver Including Roles  & Responsibilities: Yes   Decrease burden of Care through IP rehab admission:  no   Possible need for SNF placement upon  discharge:  Not anticipated   Patient Condition: This patient's condition remains as documented in the consult dated 03/25/15, in which the Rehabilitation Physician determined and documented that the patient's condition is appropriate for intensive rehabilitative care in an inpatient rehabilitation facility. Will admit to inpatient rehab today.  Preadmission Screen Completed By:  Weldon Picking, 03/26/2015 1:16 PM ______________________________________________________________________   Discussed status with Dr.  Wynn Banker on 03/26/15 at  1316  and received telephone approval for admission today.  Admission Coordinator:  Weldon Picking, time 0981 /Date 03/26/15

## 2015-03-27 ENCOUNTER — Inpatient Hospital Stay (HOSPITAL_COMMUNITY): Payer: Medicare Other | Admitting: Speech Pathology

## 2015-03-27 ENCOUNTER — Inpatient Hospital Stay (HOSPITAL_COMMUNITY): Payer: PRIVATE HEALTH INSURANCE | Admitting: Physical Therapy

## 2015-03-27 ENCOUNTER — Inpatient Hospital Stay (HOSPITAL_COMMUNITY): Payer: Medicare Other

## 2015-03-27 DIAGNOSIS — G8190 Hemiplegia, unspecified affecting unspecified side: Secondary | ICD-10-CM

## 2015-03-27 DIAGNOSIS — I63311 Cerebral infarction due to thrombosis of right middle cerebral artery: Secondary | ICD-10-CM

## 2015-03-27 DIAGNOSIS — M25432 Effusion, left wrist: Secondary | ICD-10-CM

## 2015-03-27 LAB — CBC WITH DIFFERENTIAL/PLATELET
Basophils Absolute: 0 10*3/uL (ref 0.0–0.1)
Basophils Relative: 0 % (ref 0–1)
EOS PCT: 2 % (ref 0–5)
Eosinophils Absolute: 0.1 10*3/uL (ref 0.0–0.7)
HCT: 31 % — ABNORMAL LOW (ref 36.0–46.0)
Hemoglobin: 9.6 g/dL — ABNORMAL LOW (ref 12.0–15.0)
LYMPHS PCT: 18 % (ref 12–46)
Lymphs Abs: 1.1 10*3/uL (ref 0.7–4.0)
MCH: 24.7 pg — ABNORMAL LOW (ref 26.0–34.0)
MCHC: 31 g/dL (ref 30.0–36.0)
MCV: 79.9 fL (ref 78.0–100.0)
Monocytes Absolute: 0.9 10*3/uL (ref 0.1–1.0)
Monocytes Relative: 14 % — ABNORMAL HIGH (ref 3–12)
NEUTROS ABS: 4 10*3/uL (ref 1.7–7.7)
Neutrophils Relative %: 66 % (ref 43–77)
PLATELETS: 194 10*3/uL (ref 150–400)
RBC: 3.88 MIL/uL (ref 3.87–5.11)
RDW: 16.8 % — ABNORMAL HIGH (ref 11.5–15.5)
WBC: 6.1 10*3/uL (ref 4.0–10.5)

## 2015-03-27 LAB — COMPREHENSIVE METABOLIC PANEL
ALT: 12 U/L — ABNORMAL LOW (ref 14–54)
AST: 22 U/L (ref 15–41)
Albumin: 3 g/dL — ABNORMAL LOW (ref 3.5–5.0)
Alkaline Phosphatase: 54 U/L (ref 38–126)
Anion gap: 11 (ref 5–15)
BUN: 7 mg/dL (ref 6–20)
CO2: 25 mmol/L (ref 22–32)
Calcium: 9 mg/dL (ref 8.9–10.3)
Chloride: 103 mmol/L (ref 101–111)
Creatinine, Ser: 0.52 mg/dL (ref 0.44–1.00)
GFR calc Af Amer: >60 mL/min (ref >60)
GFR calc non Af Amer: >60 mL/min (ref >60)
Glucose, Bld: 105 mg/dL — ABNORMAL HIGH (ref 65–99)
Potassium: 3.3 mmol/L — ABNORMAL LOW (ref 3.5–5.1)
Sodium: 139 mmol/L (ref 135–145)
Total Bilirubin: 0.9 mg/dL (ref 0.3–1.2)
Total Protein: 6.7 g/dL (ref 6.5–8.1)

## 2015-03-27 LAB — URIC ACID: Uric Acid, Serum: 2.9 mg/dL (ref 2.3–6.6)

## 2015-03-27 MED ORDER — ACETAMINOPHEN 325 MG PO TABS
650.0000 mg | ORAL_TABLET | ORAL | Status: DC | PRN
  Administered 2015-03-27 – 2015-04-08 (×5): 650 mg via ORAL
  Filled 2015-03-27 (×6): qty 2

## 2015-03-27 MED ORDER — TRAMADOL HCL 50 MG PO TABS
25.0000 mg | ORAL_TABLET | Freq: Four times a day (QID) | ORAL | Status: DC | PRN
  Administered 2015-03-27: 25 mg via ORAL
  Filled 2015-03-27 (×3): qty 1

## 2015-03-27 MED ORDER — POTASSIUM CHLORIDE CRYS ER 20 MEQ PO TBCR
20.0000 meq | EXTENDED_RELEASE_TABLET | Freq: Two times a day (BID) | ORAL | Status: AC
Start: 2015-03-27 — End: 2015-03-28
  Administered 2015-03-27 – 2015-03-28 (×4): 20 meq via ORAL
  Filled 2015-03-27 (×4): qty 1

## 2015-03-27 MED ORDER — DICLOFENAC SODIUM 1 % TD GEL
2.0000 g | Freq: Three times a day (TID) | TRANSDERMAL | Status: DC
  Administered 2015-03-27 – 2015-04-08 (×35): 2 g via TOPICAL
  Filled 2015-03-27: qty 100

## 2015-03-27 NOTE — Evaluation (Signed)
Occupational Therapy Assessment and Plan  Patient Details  Name: Leah Figueroa MRN: [id number] Date of Birth: [birth year]  OT Diagnosis: abnormal posture, ataxia, cognitive deficits and hemiplegia affecting non-dominant side Rehab Potential: Rehab Potential (ACUTE ONLY): Good ELOS: 14-18 days   Today's Date: 03/27/2015 OT Individual Time: 0930-1100 OT Individual Time Calculation (min): 90 min     Problem List:  Patient Active Problem List   Diagnosis Date Noted  . Hyperlipidemia LDL goal <70 03/26/2015  . Hyperglycemia 03/26/2015  . 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 Embolic stroke   . Left hemiplegia   . Homonymous hemianopia   . Chest wall trauma 01/11/2014  . Injury of left lower arm 01/11/2014  . Headache(784.0) 01/04/2013  . Allergic rhinitis, cause unspecified 01/04/2013  . Anemia 04/19/2012  . Epistaxis, recurrent 04/19/2012  . HYPOTHYROIDISM 01/01/2008  . Essential hypertension 01/01/2008  . HYPERTENSION 01/01/2008  . MYOCARDIAL INFARCTION, ACUTE, ANTERIOR WALL 01/01/2008  . LEG CRAMPS 01/01/2008  . ADENOCARCINOMA, BREAST, HX OF 01/01/2008  . DIVERTICULOSIS, COLON, HX OF 01/01/2008  . ARTHRITIS, HX OF 01/01/2008  . RADIATION THERAPY, HX OF 01/01/2008  . CATARACT EXTRACTION, HX OF 01/01/2008  . CHOLECYSTECTOMY, LAPAROSCOPIC, HX OF 01/01/2008    Past Medical History:  Past Medical History  Diagnosis Date  . Other specified personal history presenting hazards to health(V15.89)   . Personal history of arthritis   . Unspecified personal history presenting hazards to health   . Cramp of limb   . Acute myocardial infarction of other anterior wall, episode of care unspecified   . Personal history of malignant neoplasm of breast   . Unspecified essential hypertension   . Personal history of other diseases of digestive system   . Unspecified hypothyroidism   . Other and unspecified hyperlipidemia    Past Surgical History:  Past  Surgical History  Procedure Laterality Date  . Right thyroid lobectomy    . Cataract extraction      left-2006, right 2011  . Intrathoracic goiter resection    . Cholecystectomy    . Lumpectomy for breast cancer  80's    left  . Back surgery      Assessment & Plan Clinical Impression: Patient is a 81 y.o. female with history of HTN, HA, who was at home with family when she started to lean to the left then fell with attempts at waking. She was evaluated in ED on 08/06 and developed left sided weakness after presentation and received t-PA. MRI/MRA brain done revealing Right MCA infarct affecting frontoparietal lobe, subcentimeter right pontine infarct, severe white mater changes due to SVD and focal high grade stenosis left A2 segment. 2D echo with EF 60-65% with apical akinesis, moderate concentric LVH and moderately calcified MV. Carotid dopplers without significant ICA stenosis. Dr. Leonie Man recommended ASA for R-MCA infarct of unknown etiology. ST evaluation revealed confusion with complex verbal instructions and poor awareness of deficits. Patient with resultant left sided weakness with sensory deficits. Left visual field deficits with left inattention as well as mild cognitive deficits..  Patient transferred to CIR on 03/26/2015 .    Patient currently requires max with basic Kaluzny-care skills secondary to abnormal tone, ataxia, decreased coordination and decreased motor planning and decreased attention, decreased awareness, decreased problem solving, decreased safety awareness and decreased memory.  Prior to hospitalization, patient could complete BADL/iADL independently although family reports awareness of gradual cognitive decline (attention, orientation, memory).  Patient will benefit from skilled intervention to decrease level of  assist with basic Abdo-care skills prior to discharge home with care partner.  Anticipate patient will require 24 hour supervision and follow up home health.  OT -  End of Session Activity Tolerance: Tolerates 30+ min activity with multiple rests Endurance Deficit: Yes OT Assessment Rehab Potential (ACUTE ONLY): Good Barriers to Discharge: Decreased caregiver support OT Patient demonstrates impairments in the following area(s): Balance;Cognition;Endurance;Pain;Safety OT Basic ADL's Functional Problem(s): Eating;Grooming;Bathing;Dressing;Toileting OT Advanced ADL's Functional Problem(s): Simple Meal Preparation;Laundry;Light Housekeeping OT Transfers Functional Problem(s): Toilet;Tub/Shower OT Additional Impairment(s): Fuctional Use of Upper Extremity OT Plan OT Intensity: Minimum of 1-2 x/day, 45 to 90 minutes OT Frequency: 5 out of 7 days OT Duration/Estimated Length of Stay: 14-18 days OT Treatment/Interventions: Balance/vestibular training;UE/LE Coordination activities;Therapeutic Activities;Therapeutic Exercise;UE/LE Strength taining/ROM;Patient/family education;Wheelchair propulsion/positioning;Neuromuscular Product manager;Functional mobility training;Discharge planning;Cognitive remediation/compensation OT Jann Feeding Anticipated Outcome(s): Mod I OT Basic Cappelli-Care Anticipated Outcome(s): Supervision OT Toileting Anticipated Outcome(s): Mod I OT Bathroom Transfers Anticipated Outcome(s): Supervision OT Recommendation Patient destination: Home Follow Up Recommendations: Home health OT Equipment Recommended: To be determined  Skilled Therapeutic Intervention OT 1:1 initial evaluation completed with treatment provided to address family ed (daughter present), transfers, goals/methods of treatment, adapted bathing and NMR of LUE.   Pt demonstrated inattention to cues (question hearing impairment vs cognitive impairment) with poor awareness or carry-over from limitations from weakness at left wrist/hand.   Pt completed shower level BADL and toileting with overall mod assist during initial session due to decreased [place]  at left hand.   OT Evaluation Precautions/Restrictions  Precautions Precautions: Fall Restrictions Weight Bearing Restrictions: No  General Chart Reviewed: Yes Family/Caregiver Present: Yes [name])  Pain Pain Assessment Pain Assessment: Faces Pain Score: 6  Pain Location: Wrist Pain Orientation: Left Pain Descriptors / Indicators: Aching Pain Onset: On-going Pain Intervention(s): Medication (See eMAR) Multiple Pain Sites: No  Home Living/Prior Functioning Home Living Type of Home: House (New home, 8 years ago) Home Access: Stairs to enter [employer] of Steps: 1 step to [employer] Insurance: None Home Layout: One level Bathroom Shower/Tub: Tub/shower unit (takes bath, doesn't ) Bathroom Toilet: Standard Bathroom Accessibility: Yes  Lives With: Alone IADL History Homemaking Responsibilities: Yes Meal Prep Responsibility: Primary Laundry Responsibility: Primary Cleaning Responsibility: Primary (does every thursday) Bill Paying/Finance Responsibility: Primary Shopping Responsibility: Primary Child Care Responsibility: No Current License: Yes Mode of Transportation: Car Pensions consultant, 2015) Education: HS + business course Occupation: Retired Type of Occupation: Press photographer, 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) Leisure and Hobbies: church, reads, gardens, reads Prior Function Level of Independence: Independent with basic ADLs, Independent with gait  Able to Take Stairs?: Yes (needed rails) Driving: Yes Vocation: Unemployed  ADL ADL ADL Comments: see FIM  Vision/Perception  Vision- History Baseline Vision/History: No visual deficits Patient Visual Report: No change from baseline   Cognition Overall Cognitive Status: Impaired/Different from baseline (daughter notes decline in cognition 1+ yrs) Arousal/Alertness: Awake/alert Orientation Level: Person;Place;Situation Person: Oriented Place: Oriented Situation: Oriented Year:  2016 Month: August Day of Week: Correct Memory: Impaired Memory Impairment: Retrieval deficit Immediate Memory Recall: Sock;Bed (cue for color) Memory Recall: Sock;Bed (cue for color) Memory Recall Sock: Without Cue Memory Recall Bed: Without Cue Attention: Selective Selective Attention: Impaired Selective Attention Impairment: Verbal complex;Functional basic Awareness: Impaired Awareness Impairment: Intellectual impairment Problem Solving: Impaired Problem Solving Impairment: Functional basic;Verbal complex (using LUE) Executive Function: Decision Making Decision Making: Impaired Decision Making Impairment: Functional basic;Verbal complex Behaviors: Impulsive Safety/Judgment: Impaired Sensation Sensation Light Touch: Appears Intact Stereognosis: Appears Intact Hot/Cold: Appears Intact Proprioception:  Impaired by gross assessment (LUE) Coordination Gross Motor Movements are Fluid and Coordinated: No Fine Motor Movements are Fluid and Coordinated: No Coordination and Movement Description: Impaired FMC of left hand, imparied coordination a LUE  Motor  Motor Motor: Abnormal postural alignment and control;Ataxia;Hemiplegia Motor - Skilled Clinical Observations: left hemiparesis, ataxic at left UE and LE  Mobility  Bed Mobility Bed Mobility: Rolling Right;Rolling Left;Supine to Sit;Sit to Supine Rolling Right: 5: Supervision Rolling Right Details: Verbal cues for precautions/safety;Verbal cues for sequencing Rolling Left: 5: Supervision Rolling Left Details: Verbal cues for sequencing;Verbal cues for precautions/safety Supine to Sit: 4: Min assist Supine to Sit Details: Verbal cues for sequencing;Verbal cues for technique;Tactile cues for placement Sit to Supine: 4: Min assist Sit to Supine - Details: Verbal cues for sequencing;Tactile cues for placement;Verbal cues for technique   Trunk/Postural Assessment  Cervical Assessment Cervical Assessment: Exceptions to [facility]  (forward head posture) Thoracic Assessment Thoracic Assessment: Exceptions to [facility] (Kyphosis) Lumbar Assessment Lumbar Assessment: Exceptions to [facility] (reverse lumbar lordosis) Postural Control Postural Control: Deficits on evaluation   Balance Balance Balance Assessed: Yes Standardized Balance Assessment Standardized Balance Assessment: Berg Balance Test Berg Balance Test Sit to Stand: Needs minimal aid to stand or to stabilize Standing Unsupported: Needs several tries to stand 30 seconds unsupported Sitting with Back Unsupported but Feet Supported on Floor or Stool: Able to sit 2 minutes under supervision Stand to Sit: Uses backs of legs against chair to control descent Transfers: Needs one person to assist Standing Unsupported with Eyes Closed: Needs help to keep from falling Standing Ubsupported with Feet Together: Needs help to attain position and unable to hold for 15 seconds From Standing, Reach Forward with Outstretched Arm: Loses balance while trying/requires external support From Standing Position, Pick up Object from Floor: Unable to try/needs assist to keep balance From Standing Position, Turn to Look Behind Over each Shoulder: Needs assist to keep from losing balance and falling Turn 360 Degrees: Needs assistance while turning Standing Unsupported, Alternately Place Feet on Step/Stool: Needs assistance to keep from falling or unable to try Standing Unsupported, One Foot in Front: Loses balance while stepping or standing Standing on One Leg: Unable to try or needs assist to prevent fall Total Score: 8 Static Sitting Balance Static Sitting - Balance Support: Feet supported Static Sitting - Level of Assistance: 5: Stand by assistance Dynamic Sitting Balance Dynamic Sitting - Balance Support: Feet supported Dynamic Sitting - Level of Assistance: 5: Stand by assistance Dynamic Sitting - Balance Activities: Reaching for objects;Forward lean/weight shifting;Reaching across  midline Sitting balance - Comments: reduced balance with cognitive dual task, when distracted Static Standing Balance Static Standing - Balance Support: Left upper extremity supported;During functional activity Static Standing - Level of Assistance: 4: Min assist Dynamic Standing Balance Dynamic Standing - Balance Support: During functional activity;No upper extremity supported Dynamic Standing - Level of Assistance: 3: Mod assist Dynamic Standing - Balance Activities: Reaching for objects;Lateral lean/weight shifting;Forward lean/weight shifting   Extremity/Trunk Assessment RUE Assessment RUE Assessment: Within Functional Limits LUE Assessment LUE Assessment: Exceptions to [facility] LUE Strength LUE Overall Strength: Deficits (Grossly 3+/5 above wrist and 2-/5 at wrist/fingers)  FIM:  FIM - Grooming Grooming Steps: Wash, rinse, dry face Grooming: 2: Patient completes 1 of 4 or 2 of 5 steps FIM - Bathing Bathing Steps Patient Completed: Chest;Left Arm;Abdomen;Front perineal area;Buttocks;Right upper leg;Left upper leg Bathing: 3: Mod-Patient completes 5-7 57f 10 parts or 50-74% FIM - Upper Body Dressing/Undressing Upper body dressing/undressing steps patient completed:  Thread/unthread right bra strap;Thread/unthread right sleeve of pullover shirt/dresss Upper body dressing/undressing: 2: Max-Patient completed 25-49% of tasks FIM - Lower Body Dressing/Undressing Lower body dressing/undressing: 1: Total-Patient completed less than 25% of tasks FIM - Toileting Toileting steps completed by patient: Adjust clothing prior to toileting;Performs perineal hygiene Toileting: 2: Max-Patient completed 1 of 3 steps FIM - Bed/Chair Transfer Bed/Chair Transfer Assistive Devices: HOB elevated;Bed rails;Arm rests Bed/Chair Transfer: 4: Supine > Sit: Min A (steadying Pt. > 75%/lift 1 leg);4: Bed > Chair or W/C: Min A (steadying Pt. > 75%) FIM - Radio producer Devices: Grab  bars Toilet Transfers: 3-To toilet/BSC: Mod A (lift or lower assist);3-From toilet/BSC: Mod A (lift or lower assist)   Refer to Care Plan for Long Term Goals  Recommendations for other services: None  Discharge Criteria: Patient will be discharged from OT if patient refuses treatment 3 consecutive times without medical reason, if treatment goals not met, if there is a change in medical status, if patient makes no progress towards goals or if patient is discharged from hospital.  The above assessment, treatment plan, treatment alternatives and goals were discussed and mutually agreed upon: by patient and by family  [facility] 03/28/2015, 6:59 AM

## 2015-03-27 NOTE — Progress Notes (Signed)
Social Work Assessment and Plan Social Work Assessment and Plan  Patient Details  Name: Leah Figueroa MRN: [id number] Date of Birth: [birth year]  Today's Date: 03/27/2015  Problem List:  Patient Active Problem List   Diagnosis Date Noted  . Hyperlipidemia LDL goal <70 03/26/2015  . Hyperglycemia 03/26/2015  . 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 Embolic stroke   . Left hemiplegia   . Homonymous hemianopia   . Chest wall trauma 01/11/2014  . Injury of left lower arm 01/11/2014  . Headache(784.0) 01/04/2013  . Allergic rhinitis, cause unspecified 01/04/2013  . Anemia 04/19/2012  . Epistaxis, recurrent 04/19/2012  . HYPOTHYROIDISM 01/01/2008  . Essential hypertension 01/01/2008  . HYPERTENSION 01/01/2008  . MYOCARDIAL INFARCTION, ACUTE, ANTERIOR WALL 01/01/2008  . LEG CRAMPS 01/01/2008  . ADENOCARCINOMA, BREAST, HX OF 01/01/2008  . DIVERTICULOSIS, COLON, HX OF 01/01/2008  . ARTHRITIS, HX OF 01/01/2008  . RADIATION THERAPY, HX OF 01/01/2008  . CATARACT EXTRACTION, HX OF 01/01/2008  . CHOLECYSTECTOMY, LAPAROSCOPIC, HX OF 01/01/2008   Past Medical History:  Past Medical History  Diagnosis Date  . Other specified personal history presenting hazards to health(V15.89)   . Personal history of arthritis   . Unspecified personal history presenting hazards to health   . Cramp of limb   . Acute myocardial infarction of other anterior wall, episode of care unspecified   . Personal history of malignant neoplasm of breast   . Unspecified essential hypertension   . Personal history of other diseases of digestive system   . Unspecified hypothyroidism   . Other and unspecified hyperlipidemia    Past Surgical History:  Past Surgical History  Procedure Laterality Date  . Right thyroid lobectomy    . Cataract extraction      left-2006, right 2011  . Intrathoracic goiter resection    . Cholecystectomy    . Lumpectomy for breast cancer  80's    left  .  Back surgery     Social History:  reports that she has never smoked. She has never used smokeless tobacco. She reports that she does not drink alcohol or use illicit drugs.  Family / Support Systems Marital Status: Widow/Widower Patient Roles: Parent, Other (Comment) (Employee-part time) Children: [name] Pegram-daughter [phone] Other Supports: grandchild Anticipated Caregiver: Unsure at this time, awaiting progress Ability/Limitations of Caregiver: Daughter works Sun-Thurs 10;00-7;00pm and is aware will need to come up with a 24 hr care plan.  Caregiver Availability: Other (Comment) (Coming up with a plan for discharge) Family Dynamics: Close knit family pt has only one child and they are close, pt has been very independent and lived alone along with working part time as a Conservator, museum/gallery. 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 Her daughter is very involved and will assist Mom if necessary   Abuse/Neglect Physical Abuse: Denies Verbal Abuse: Denies Sexual Abuse: Denies Exploitation of patient/patient's resources: Denies [place]-Neglect: Denies  Emotional Status Pt's affect, behavior adn adjustment status: Pt is very motivated to improve and regain as much function as possible  while here.  She has always been independent and cared for herself.  She wants to see how well she does before deciding upon anything. Daughter here to observe today. Recent Psychosocial Issues: Other  medical issues but were managed before this. Pyschiatric History: No history deferred depression screen due to pt is doing well and has a positive attitude toward her recovery from this stroke. Substance Abuse History: No issues  Patient / Family Perceptions, Expectations & Goals Pt/Family understanding of illness & functional limitations: Pt and daughter have a good understanding of pt;s stroke.  Still in shock this has happened and processing information. Both talk with the MD who round and feel their questions and concerns are being addressed. Concerned about pt's wrist but MD aware and addressing. 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 etc Anticipated changes in roles/activities/participation: resume Pt/family expectations/goals: Pt states: " I want to be able to do as much as I can for myself. beofre I leave here."  Daughter states: " I am on leave now, but not sure what I plan to do."  [employer]: None Premorbid Home Care/DME Agencies: None Transportation available at discharge: Family Resource referrals recommended: Support group (specify)  Discharge Planning Living Arrangements: Alone Support Systems: Children, Manufacturing engineer, Psychologist, clinical community Type of Residence: Private residence Insurance Resources: [employer], Media planner (specify) Administrator) Financial Resources: Tree surgeon, Employment Financial Screen Referred: No Living Expenses: Own Money Management: Patient Does the patient have any problems obtaining your medications?: No Home Management: Patient Patient/Family Preliminary Plans: Unsure at this time, will be dependent upon pt;s progress, but both aware pt will require 24 hr care at discharge.  Pt has lived alone and been very independent, daughter is still in shock this has happened. Will await team' s evaluations and work on a discharge plan. Social Work Anticipated Follow Up  Needs: HH/OP, SNF, Support Group  Clinical Impression Pleasant female who is motivated to improve and recover as much as she can before discharge. Daughter is here and observing in therapies today, encouraged daughter to work and take her leave when pt closer to going home if This is the plan. Both aware pt will require 24 hr care at discharge, but unsure how to provide for this. Awaiting team's evaluations and will come up with a safe discharge plan. Discussed options with daughter and pt, so daughter can Begin working on a plan.  Lucy Chris 03/27/2015, 1:09 PM

## 2015-03-27 NOTE — Discharge Summary (Signed)
Micki Riley, MD Physician Signed Neurology Progress Notes 03/26/2015 11:47 AM    Expand All Collapse All   Stroke Discharge Summary  Patient ZO:XWRUE E Selfl MRN: [id number] DOB: 1922-09-14  Date of Admission: 03/22/2015 Date of Discharge: 03/26/2015  Attending Physician: Micki Riley, MD, Stroke MD  Consulting Physician(s):   Claudette Laws, MD (Physical Medicine & Rehabtilitation)  Patient's PCP: No PCP Per Patient  Discharge Diagnoses:  Principal Problem:  Embolic stroke - Non-dominant Right MCA infarct s/p IV tPA. Stroke etiology unknown. Active Problems:  Essential hypertension  Anemia  Left hemiplegia  Homonymous hemianopia  Hyperlipidemia LDL goal <70  Hyperglycemia  CAD  Past Medical History  Diagnosis Date  . Other specified personal history presenting hazards to health(V15.89)   . Personal history of arthritis   . Unspecified personal history presenting hazards to health   . Cramp of limb   . Acute myocardial infarction of other anterior wall, episode of care unspecified   . Personal history of malignant neoplasm of breast   . Unspecified essential hypertension   . Personal history of other diseases of digestive system   . Unspecified hypothyroidism   . Other and unspecified hyperlipidemia    Past Surgical History  Procedure Laterality Date  . Right thyroid lobectomy    . Cataract extraction      left-2006, right 2011  . Intrathoracic goiter resection    . Cholecystectomy    . Lumpectomy for breast cancer  80's    left  . Back surgery      Medications to be continued on Rehab . amLODipine 2.5 mg Oral Daily  . aspirin EC 81 mg Oral q morning - 10a  . enoxaparin (LOVENOX) injection 40 mg Subcutaneous Q24H  . levothyroxine 25  mcg Oral QAC breakfast  . metoprolol tartrate 50 mg Oral BID  . pantoprazole 40 mg Oral QHS  . pravastatin 40 mg Oral q1800    LABORATORY STUDIES CBC  Labs (Brief)       Component Value Date/Time   WBC 5.9 03/23/2015 0019   RBC 4.02 03/23/2015 0019   HGB 10.9* 03/23/2015 0026   HCT 32.0* 03/23/2015 0026   PLT 212 03/23/2015 0019   MCV 80.3 03/23/2015 0019   MCH 24.6* 03/23/2015 0019   MCHC 30.7 03/23/2015 0019   RDW 16.6* 03/23/2015 0019   LYMPHSABS 1.4 03/23/2015 0019   MONOABS 0.6 03/23/2015 0019   EOSABS 0.1 03/23/2015 0019   BASOSABS 0.0 03/23/2015 0019     CMP  Labs (Brief)       Component Value Date/Time   NA 142 03/23/2015 0026   K 3.7 03/23/2015 0026   CL 106 03/23/2015 0026   CO2 24 03/23/2015 0019   GLUCOSE 154* 03/23/2015 0026   BUN 16 03/23/2015 0026   CREATININE 0.60 03/23/2015 0026   CALCIUM 9.2 03/23/2015 0019   PROT 6.8 03/23/2015 0019   ALBUMIN 3.5 03/23/2015 0019   AST 29 03/23/2015 0019   ALT 12* 03/23/2015 0019   ALKPHOS 64 03/23/2015 0019   BILITOT 0.5 03/23/2015 0019   GFRNONAA >60 03/23/2015 0019   GFRAA >60 03/23/2015 0019     COAGS  Recent Labs    Lab Results  Component Value Date   INR 1.05 03/23/2015     Lipid Panel  Labs (Brief)       Component Value Date/Time   CHOL 152 03/23/2015 0628   TRIG 54 03/23/2015 0628   HDL 47 03/23/2015 0628   CHOLHDL  3.2 03/23/2015 0628   VLDL 11 03/23/2015 0628   LDLCALC 94 03/23/2015 0628     HgbA1C   Recent Labs    Lab Results  Component Value Date   HGBA1C 5.8* 03/23/2015     Cardiac Panel (last 3 results)    Recent Labs (last 2 labs)     No results for input(s): CKTOTAL, CKMB, TROPONINI, RELINDX in the last 72 hours.   Urinalysis  Labs (Brief)       Component Value Date/Time    COLORURINE YELLOW 03/23/2015 0257   APPEARANCEUR CLEAR 03/23/2015 0257   LABSPEC 1.008 03/23/2015 0257   PHURINE 6.0 03/23/2015 0257   GLUCOSEU NEGATIVE 03/23/2015 0257   HGBUR SMALL* 03/23/2015 0257   BILIRUBINUR NEGATIVE 03/23/2015 0257   KETONESUR NEGATIVE 03/23/2015 0257   PROTEINUR NEGATIVE 03/23/2015 0257   UROBILINOGEN 1.0 03/23/2015 0257   NITRITE NEGATIVE 03/23/2015 0257   LEUKOCYTESUR SMALL* 03/23/2015 0257     Urine Drug Screen   Labs (Brief)       Component Value Date/Time   LABOPIA NONE DETECTED 03/23/2015 0257   COCAINSCRNUR NONE DETECTED 03/23/2015 0257   LABBENZ NONE DETECTED 03/23/2015 0257   AMPHETMU NONE DETECTED 03/23/2015 0257   THCU NONE DETECTED 03/23/2015 0257   LABBARB NONE DETECTED 03/23/2015 0257      Alcohol Level  Labs (Brief)       Component Value Date/Time   ETH <5 03/23/2015 0019       SIGNIFICANT DIAGNOSTIC STUDIES CT of the head without contrast - 1. No acute intracranial pathology seen on CT. 2. Moderate cortical volume loss and diffuse small vessel ischemic microangiopathy  MRI of the head - Acute RIGHT frontoparietal lobe infarct, MCA territory. Subcentimeter acute RIGHT pontine infarct..Severe white matter changes compatible with chronic small vessel ischemic disease. Scattered susceptibility artifacts suggest sequelae of chronic hypertension without lobar hematoma  MRA HEAD: No acute large vessel occlusion. dolicoectatic intracranial vessels compatible chronic hypertension withsuperimposed moderate luminal irregularity commonly seen with atherosclerosis.Focal high-grade stenosis LEFT A2 segment.  Carotid Duplex No evidence of a significant stenosis noted in bilateral ICAs 1-39%. Bilateral vertebral arteries demonstrate normal antegrade flow.  2D Echocardiogram - Left ventricle: The cavity size was normal. There was moderateconcentric hypertrophy.  Systolic function was normal. Theestimated ejection fraction was in the range of 60% to 65%. Thereis akinesis of the apical myocardium. Doppler parameters areconsistent with abnormal left ventricular relaxation (grade 1diastolic dysfunction). Mitral valve: Moderately calcified annulus.  Impressions: No cardiac source of emboli was indentified.    HISTORY OF PRESENT ILLNES Leah Figueroa is a 81 y.o. female who was at home with family. While speaking with family was noted to start to lean to the left. The patient then attempted to stand and fell to the left. She was seated but this happened again. EMS was called at that time and the patient was brought in for evaluation. Symptoms initially improved but after presentation patient again became weak on the left. NIHSS of 6. Code stroke called at that time. Patient on ASA at home. Patient has been functional. Lives alone. Drives. Works as a Catering manager.She was last known well 03/22/2015 at 22:00. She received IV t-PA 44 mg on 03/23/2015 at 0100 hrs. She was admitted for further evaluation and treatment.    HOSPITAL COURSE Ms. Leah Figueroa is a 81 y.o. female with history of coronary artery disease with previous MI, hypertension, and hyperlipidemia presenting with left hemiparesis. She did receive IV t-PA 44 mg on 03/23/2015  at 0100 hrs.  Stroke: Non-dominant Right MCA infarct s/p IV tPA. Stroke etiology unknown.  Resultant left hemiparesis, left hemisensory deficit, left visual field deficits.  MRI Acute RIGHT frontoparietal lobe infarct, MCA territory. Subcentimeter acute RIGHT pontine infarct.  MRA No acute large vessel occlusion.  Carotid Doppler No significant stenosis  2D Echo No source of embolus. Thereis akinesis of the apical myocardium.  LDL 94  HgbA1c 5.8%  aspirin 81 mg orally every day prior to admission, continued aspirin 81 mg orally every day post tPA. Change to plavix for secondary stroke  prevention.  Given age, will not do further stroke etiology workup.  Ongoing aggressive stroke risk factor management  Therapy recommendations: CIR  Disposition: inpatient rehab. Medically ready for discharge once bed is available (lived alone PTA)  Hypertension  Home meds: Metoprolol  Stable  Hyperlipidemia  Home meds: Mevacor 20 mg daily resumed in hospital  LDL 94, goal < 70  Increases Mevacor to 40 mg daily (using pravachol 40 in hospital)  Continue statin at discharge  Elevated glucose levels  150s  HgbA1c 5.8, at goal < 7.0  No history of diabetes  Other Stroke Risk Factors  Advanced age  Coronary artery disease s/p MI  Other Active Problems  Anemia, stable   DISCHARGE EXAM Blood pressure 152/67, pulse 64, temperature 98.7 F (37.1 C), temperature source Oral, resp. rate 20, height 5\' 3"  (1.6 m), weight 49.442 kg (109 lb), SpO2 98 %. Frail elderly caucasian lady not in distress. . Afebrile. Head is nontraumatic. Neck is supple without bruit. Cardiac exam no murmur or gallop. Lungs are clear to auscultation. Distal pulses are well felt.  Neurological Exam :  Awake alert oriented to time place and person. Speech is clear without dysarthria or aphasia. Right gaze preference but able to look to the left all the way. Pupils equal reactive. Fundi were not visualized. Dense left homonymous hemianopsia. No facial weakness. Tongue midline. Motor system exam revealed mild left upper extremity drift with significant weakness of left grip, wrist and intrinsic hand muscles. 3/5 proximal strength in the left upper extremity. No lower extremity drift on the left but mild weakness of left hip flexor and ankle dorsiflexors. Normal strength on the right. Mild diminished left hemibody sensation. Mild left hemi-inattention. Able to recognize left side of her body. The tendon reflexes are normal on the right depressed on the left. Right plantar downgoing left is equivocal.  Mild impaired left finger-to-nose coordination but knee to heel coordination is intact. Gait was not tested. NIH stroke scale score 6  Discharge Diet Diet regular Room service appropriate?: Yes; Fluid consistency:: Thin liquids  DISCHARGE PLAN  Disposition: Transfer to [facility] for ongoing PT, OT and ST  clopidogrel 75 mg orally every day for secondary stroke prevention.  Recommend ongoing risk factor control by Primary Care Physician at time of discharge from inpatient rehabilitation.  Follow-up No PCP Per Patient in 2 weeks following discharge from rehab.  Follow-up with Dr. Delia Heady, Stroke Clinic in 2 months.  35 minutes were spent preparing discharge.  Rhoderick Moody [facility] See Amion for Pager information 03/26/2015 11:58 AM  I have personally examined this patient, reviewed notes, independently viewed imaging studies, participated in medical decision making and plan of care. I have made any additions or clarifications directly to the above note. Agree with note above.  Delia Heady, MD Medical Director [facility] Pager: [phone] 03/26/2015 11:11 PM  Revision History     Date/Time User Provider Type Action   03/26/2015 11:11 PM Micki Riley, MD Physician Sign   03/26/2015 12:01 PM Layne Benton, NP Nurse Practitioner Sign   View Details Report

## 2015-03-27 NOTE — H&P (View-Only) (Signed)
Physical Medicine and Rehabilitation Admission H&P   Chief Complaint  Patient presents with  . Left sided weakness with sensory deficits, left visual field deficits with left neglect.    HPI: Leah Figueroa is a 81 y.o. female with history of HTN, HA, who was at home with family when she started to lean to the left then fell with attempts at waking. She was evaluated in ED on 08/06 and developed left sided weakness after presentation and received t-PA. MRI/MRA brain done revealing Right MCA infarct affecting frontoparietal lobe, subcentimeter right pontine infarct, severe white mater changes due to SVD and focal high grade stenosis left A2 segment. 2D echo with EF 60-65% with apical akinesis, moderate concentric LVH and moderately calcified MV. Carotid dopplers without significant ICA stenosis. Dr. Pearlean Brownie recommended ASA for R-MCA infarct of unknown etiology. ST evaluation revealed confusion with complex verbal instructions and poor awareness of deficits. Patient with resultant left sided weakness with sensory deficits. Left visual field deficits with left inattention as well as mild cognitive deficits. PT/OT evaluations done and CIR recommended by MD and Rehab team.   Pt remembers me from yesterday as well as my specialty.  ROS No pain c/os, Has weakness in LUE >LE, no swallowing issues Review of Systems  Constitutional: Negative.   HENT: Negative.   Eyes: Negative.   Respiratory: Negative.   Cardiovascular: Negative.   Gastrointestinal: Negative.   Genitourinary: Negative.   Musculoskeletal: Negative.   Skin: Negative.   Neurological: Positive for tingling, sensory change and focal weakness.  Endo/Heme/Allergies: Negative.   Psychiatric/Behavioral: Negative.     Past Medical History  Diagnosis Date  . Other specified personal history presenting hazards to health(V15.89)   . Personal history of arthritis   . Unspecified personal history presenting hazards to  health   . Cramp of limb   . Acute myocardial infarction of other anterior wall, episode of care unspecified   . Personal history of malignant neoplasm of breast   . Unspecified essential hypertension   . Personal history of other diseases of digestive system   . Unspecified hypothyroidism   . Other and unspecified hyperlipidemia     Past Surgical History  Procedure Laterality Date  . Right thyroid lobectomy    . Cataract extraction      left-2006, right 2011  . Intrathoracic goiter resection    . Cholecystectomy    . Lumpectomy for breast cancer  80's    left  . Back surgery      Family History  Problem Relation Age of Onset  . Colon cancer Mother   . Coronary artery disease Brother   . Breast cancer Mother      Social History: Lives alone. Works as a Catering manager. She reports that she has never smoked. She has never used smokeless tobacco. She reports that she does not drink alcohol or use illicit drugs.    Allergies: No Known Allergies    Medications Prior to Admission  Medication Sig Dispense Refill  . amLODipine (NORVASC) 2.5 MG tablet Take 2.5 mg by mouth daily.    Marland Kitchen aspirin EC 81 MG tablet Take 81 mg by mouth every morning.    Marland Kitchen ibuprofen (ADVIL,MOTRIN) 200 MG tablet Take 200 mg by mouth 2 (two) times daily as needed for moderate pain.    Marland Kitchen levothyroxine (SYNTHROID, LEVOTHROID) 25 MCG tablet Take 1 tablet by mouth daily 90 tablet 0  . metoprolol (LOPRESSOR) 50 MG tablet Take 1 tablet by mouth two times daily 180  tablet 0  . furosemide (LASIX) 20 MG tablet Take 1 tablet (20 mg total) by mouth daily. (Patient not taking: Reported on 03/23/2015) 90 tablet 3  . lovastatin (MEVACOR) 20 MG tablet Take 1 tablet by mouth at bedtime 90 tablet 0    Home: Home Living Family/patient expects to be discharged to:: Private residence Living Arrangements:  Alone Available Help at Discharge: Other (Comment) (daughter is planning to arrange for 24/7 assist/(S)) Type of Home: House Home Equipment: None Lives With: Alone  Functional History: Prior Function Level of Independence: Independent Comments: including driving and still working as a Radio producer Status:  Mobility: Bed Mobility Overal bed mobility: Needs Assistance Bed Mobility: Supine to Sit Supine to sit: Min assist General bed mobility comments: with mod VCs for sequencing and to find upper bed rail to her left to help her get to EOB Transfers Overall transfer level: Needs assistance Equipment used: 1 person hand held assist Transfers: Sit to/from Stand Sit to Stand: Min assist General transfer comment: Min assist for stability and power up to standing, Cues for attention to LUE, cues for posture and faciliation of stance Ambulation/Gait Ambulation/Gait assistance: Mod assist Ambulation Distance (Feet): 15 Feet (x2) Assistive device: 1 person hand held assist Gait Pattern/deviations: Step-to pattern, Decreased step length - left, Decreased dorsiflexion - left, Shuffle, Ataxic, Drifts right/left, Narrow base of support General Gait Details: patient with improvements noted in LLE placement and stride this session, but as patient fatigued increased ataxia noted. Patient required manual cues to faciliate weight shift and increased physical assist for stability. Gait velocity: decreased Gait velocity interpretation: <1.8 ft/sec, indicative of risk for recurrent falls    ADL: ADL Overall ADL's : Needs assistance/impaired Eating/Feeding: Minimal assistance, Sitting, Cueing for compensatory techinques Eating/Feeding Details (indicate cue type and reason): to find items on her left and to switch to using her right UE when her left UE is not able to do what she is trying to do. Grooming: Wash/dry face, Wash/dry hands, Oral care, Minimal assistance, Cueing for  compensatory techniques, Sitting Grooming Details (indicate cue type and reason): for finding items that she drops (right and left) and finding items on her left Upper Body Bathing: Minimal assitance, Sitting, Cueing for compensatory techniques Upper Body Bathing Details (indicate cue type and reason): and cuing to come back to midline after she starts leaning to the left Lower Body Bathing: Moderate assistance (with min A sit<>stand, but Mod A to maintain standing with dynamic activity) Upper Body Dressing : Moderate assistance, Sitting Lower Body Dressing: Moderate assistance (with min A sit<>stand, but Mod A to maintain standing with dynamic activity) Toilet Transfer: Minimal assistance, +2 for safety/equipment, Ambulation, Regular Toilet, Grab bars Toileting- Clothing Manipulation and Hygiene: Maximal assistance (with min A sit<>stand, but Mod A to maintain standing with dynamic activity)  Cognition: Cognition Overall Cognitive Status: Impaired/Different from baseline Arousal/Alertness: Awake/alert Orientation Level: Oriented X4 Attention: Sustained Sustained Attention: Appears intact Memory: Impaired Memory Impairment: Prospective memory Awareness: Impaired Awareness Impairment: Intellectual impairment, Emergent impairment, Anticipatory impairment Problem Solving: Impaired Problem Solving Impairment: Functional complex Safety/Judgment: Impaired Cognition Arousal/Alertness: Awake/alert Behavior During Therapy: Impulsive Overall Cognitive Status: Impaired/Different from baseline Area of Impairment: Safety/judgement, Problem solving, Following commands Orientation Level: (oriented except to why she was here ("i'm sick)) Following Commands: Follows one step commands inconsistently (due to [facility] and inattention) Safety/Judgement: Decreased awareness of safety, Decreased awareness of deficits Problem Solving: Difficulty sequencing, Requires verbal cues, Requires tactile cues General  Comments: Some noted improvements in  attention this session, but continues to demonstrate impulsivity     Blood pressure 150/55, pulse 58, temperature 97.9 F (36.6 C), temperature source Oral, resp. rate 18, height 5\' 3"  (1.6 m), weight 49.442 kg (109 lb), SpO2 99 %. Physical Exam   General: No acute distress Mood and affect are appropriate Heart: Regular rate and rhythm no rubs murmurs or extra sounds Lungs: Clear to auscultation, breathing unlabored, no rales or wheezes Abdomen: Positive bowel sounds, soft nontender to palpation, nondistended Extremities: No clubbing, cyanosis, or edema Skin: No evidence of breakdown, no evidence of rash Neurologic: Cranial nerves II through XII intact,  Sensory exam normal sensation to light touch and proprioception in bilateral upper and lower extremities Cerebellar exam normal finger to nose to finger as well as heel to shin in bilateral upper and lower extremities Musculoskeletal: Full passive range of motion in all 4 extremities. No joint swelling Left lower extremity 3 minus at the hip flexor and extensor ankle dorsiflexor and plantar flexor 4+/5 in the right deltoid, biceps, triceps, grip, hip flexor, knee extensor, ankle dorsiflexion plantar flexor Skin: Skin is warm and dry.     Lab Results Last 48 Hours    No results found for this or any previous visit (from the past 48 hour(s)).    Imaging Results (Last 48 hours)    No results found.       Medical Problem List and Plan: 1. Functional deficits secondary to Right MCA infarct likely thrombotic 2. DVT Prophylaxis/Anticoagulation: Pharmaceutical: Lovenox 3. Pain Management: tylenol prn for HA. Will add heat and muscle relaxer for neck pain due to right neglect.  4. Mood: LCSW to follow for evaluation and support.  5. Neuropsych: This patient is capable of making decisions on her own behalf. 6. Skin/Wound Care: Encourage adequate nutrition and hydration status. Routine pressure  relief measures.  7. Fluids/Electrolytes/Nutrition: Monitor I/O. Check lytes in am. 8. HTN: Monitor BP tid. Continue metoprolol and Norvasc.  9. Hypothyroid: Continue supplement.       Post Admission Physician Evaluation: 1. Functional deficits secondary to right MCA infarct likely thrombotic 2. Patient is admitted to receive collaborative, interdisciplinary care between the physiatrist, rehab nursing staff, and therapy team. 3. Patient's level of medical complexity and substantial therapy needs in context of that medical necessity cannot be provided at a lesser intensity of care such as a SNF. 4. Patient has experienced substantial functional loss from his/her baseline which was documented above under the "Functional History" and "Functional Status" headings. Judging by the patient's diagnosis, physical exam, and functional history, the patient has potential for functional progress which will result in measurable gains while on inpatient rehab. These gains will be of substantial and practical use upon discharge in facilitating mobility and Yim-care at the household level. 5. Physiatrist will provide 24 hour management of medical needs as well as oversight of the therapy plan/treatment and provide guidance as appropriate regarding the interaction of the two. 6. 24 hour rehab nursing will assist with bladder management, bowel management, safety, skin/wound care, disease management, medication administration, pain management and patient education and help integrate therapy concepts, techniques,education, etc. 7. PT will assess and treat for/with: pre gait, gait training, endurance , safety, equipment, neuromuscular re education. Goals are: Min A. 8. OT will assess and treat for/with: ADLs, Cognitive perceptual skills, Neuromuscular re education, safety, endurance, equipment. Goals are: Min A. Therapy may proceed with showering this patient. 9. SLP will assess and treat for/with: eval  cognition, med management, left neglect.  Goals are: Sup. 10. Case Management and Social Worker will assess and treat for psychological issues and discharge planning. 11. Team conference will be held weekly to assess progress toward goals and to determine barriers to discharge. 12. Patient will receive at least 3 hours of therapy per day at least 5 days per week. 13. ELOS: 14-18d  14. Prognosis: good     Erick Colace M.D. [facility] FAAPM&R (Sports Med, Neuromuscular Med) Diplomate Am Board of Electrodiagnostic Med  03/26/2015

## 2015-03-27 NOTE — Evaluation (Signed)
Speech Language Pathology Assessment and Plan  Patient Details  Name: Leah Figueroa MRN: [id number] Date of Birth: 03/01/1923  SLP Diagnosis: Cognitive Impairments  Rehab Potential: Excellent ELOS: 14-18 days     Today's Date: 03/27/2015 SLP Individual Time: 0800-0900 SLP Individual Time Calculation (min): 60 min   Problem List:  Patient Active Problem List   Diagnosis Date Noted  . Hyperlipidemia LDL goal <70 03/26/2015  . Hyperglycemia 03/26/2015  . 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 Embolic stroke   . Left hemiplegia   . Homonymous hemianopia   . Chest wall trauma 01/11/2014  . Injury of left lower arm 01/11/2014  . Headache(784.0) 01/04/2013  . Allergic rhinitis, cause unspecified 01/04/2013  . Anemia 04/19/2012  . Epistaxis, recurrent 04/19/2012  . HYPOTHYROIDISM 01/01/2008  . Essential hypertension 01/01/2008  . HYPERTENSION 01/01/2008  . MYOCARDIAL INFARCTION, ACUTE, ANTERIOR WALL 01/01/2008  . LEG CRAMPS 01/01/2008  . ADENOCARCINOMA, BREAST, HX OF 01/01/2008  . DIVERTICULOSIS, COLON, HX OF 01/01/2008  . ARTHRITIS, HX OF 01/01/2008  . RADIATION THERAPY, HX OF 01/01/2008  . CATARACT EXTRACTION, HX OF 01/01/2008  . CHOLECYSTECTOMY, LAPAROSCOPIC, HX OF 01/01/2008   Past Medical History:  Past Medical History  Diagnosis Date  . Other specified personal history presenting hazards to health(V15.89)   . Personal history of arthritis   . Unspecified personal history presenting hazards to health   . Cramp of limb   . Acute myocardial infarction of other anterior wall, episode of care unspecified   . Personal history of malignant neoplasm of breast   . Unspecified essential hypertension   . Personal history of other diseases of digestive system   . Unspecified hypothyroidism   . Other and unspecified hyperlipidemia    Past Surgical History:  Past Surgical History  Procedure Laterality Date  . Right thyroid lobectomy    .  Cataract extraction      left-2006, right 2011  . Intrathoracic goiter resection    . Cholecystectomy    . Lumpectomy for breast cancer  80's    left  . Back surgery      Assessment / Plan / Recommendation Clinical Impression Patient is a 81 y.o. female with history of HTN, HA, who was at home with family when she started to lean to the left then fell with attempts at walking. She was evaluated in ED on 08/06 and developed left sided weakness after presentation and received t-PA. MRI/MRA brain done revealing Right MCA infarct affecting frontoparietal lobe, subcentimeter right pontine infarct, severe white mater changes due to SVD and focal high grade stenosis left A2 segment. 2D echo with EF 60-65% with apical akinesis, moderate concentric LVH and moderately calcified MV.  Carotid dopplers without significant ICA stenosis. Dr. Sethi recommended ASA for R-MCA infarct of unknown etiology.  Patient with resultant left sided weakness with sensory deficits. Left visual field deficits with left inattention as well as mild cognitive deficits. PT/OT evaluations done and CIR recommended by MD and Rehab team. Patient transferred to CIR on 03/26/2015. Patient demonstrates moderate cognitive impairments impacting sustained attention, attention to left field of environment, intellectual awareness of deficits, recall of functional information, problem solving and safety. Patient would benefit from skilled SLP intervention to maximize cognitive function and overall functional independence prior to discharge.   Skilled Therapeutic Interventions          Administered a cognitive-linguistic evaluation. Please see above for details. Educated the patient and her daughter in regards to the patient's   current cognitive-linguistic function and goals of skilled SLP intervention. Both verbalized understanding.   SLP Assessment  Patient will need skilled [facility] during CIR admission     Recommendations  Oral Care Recommendations: Oral care BID Recommendations for Other Services: Neuropsych consult Patient destination: Home Follow up Recommendations: Outpatient SLP;24 hour supervision/assistance Equipment Recommended: None recommended by SLP    SLP Frequency 3 to 5 out of 7 days   SLP Treatment/Interventions Cognitive remediation/compensation;Cueing hierarchy;Internal/external aids;Environmental controls;Functional tasks;Patient/family education;Therapeutic Activities    Pain Pain Assessment Pain Assessment: 0-10 Faces Pain Scale: Hurts little more Pain Type: Acute pain Pain Location: Wrist Pain Orientation: Left Pain Descriptors / Indicators: Sore Pain Onset: On-going Patients Stated Pain Goal: 2 Pain Intervention(s): MD notified (Comment) (PA Pam Love present at beginning of session discussing wrist pain with pt) Multiple Pain Sites: No Prior Functioning Type of Home: House  Lives With: Alone Available Help at Discharge: Other (Comment) (daughter arranging 24/7 assist) Vocation: Part time employment  Short Term Goals: Week 1: SLP Short Term Goal 1 (Week 1): Patient will utilize call bell to request assistance in 75% of observable opportunities with supervision verbal cues.  SLP Short Term Goal 2 (Week 1): Patient will demonstrate functional problem solving for basic and familiar tasks with Min A multimodal cues.  SLP Short Term Goal 3 (Week 1): Patient will identify 2 cognitive and 2 physical deficits with Min A question cues.  SLP Short Term Goal 4 (Week 1): Patient will utilize external memory aids to recall new, daily information with Min A multimodal cues.  SLP Short Term Goal 5 (Week 1): Patient will demonstrate sustained attention to functional tasks for 60 minutes with Min A verbal cues for redirection.   See FIM for current functional status Refer to Care Plan for Long Term Goals  Recommendations for other services: Neuropsych  Discharge Criteria:  Patient will be discharged from SLP if patient refuses treatment 3 consecutive times without medical reason, if treatment goals not met, if there is a change in medical status, if patient makes no progress towards goals or if patient is discharged from hospital.  The above assessment, treatment plan, treatment alternatives and goals were discussed and mutually agreed upon: by patient and by family  Jozsef Wescoat 03/27/2015, 4:58 PM

## 2015-03-27 NOTE — Interval H&P Note (Signed)
Leah Figueroa was admitted today to Inpatient Rehabilitation with the diagnosis of Right MCA infarct with Left hemiparesis.  The patient's history has been reviewed, patient examined, and there is no change in status.  Patient continues to be appropriate for intensive inpatient rehabilitation.  I have reviewed the patient's chart and labs.  Questions were answered to the patient's satisfaction. The PAPE has been reviewed and assessment remains appropriate.  Erick Colace 03/27/2015, 6:44 AM

## 2015-03-27 NOTE — Progress Notes (Signed)
81 y.o. female with history of HTN, HA, who was at home with family when she started to lean to the left then fell with attempts at waking. She was evaluated in ED on 08/06 and developed left sided weakness after presentation and received t-PA. MRI/MRA brain done revealing Right MCA infarct affecting frontoparietal lobe, subcentimeter right pontine infarct, severe white mater changes due to SVD and focal high grade stenosis left A2 segment. 2D echo with EF 60-65% with apical akinesis, moderate concentric LVH and moderately calcified MV. Carotid dopplers without significant ICA stenosis. Dr. Leonie Man recommended ASA for R-MCA infarct of unknown etiology Subjective/Complaints:   Objective: Vital Signs: Blood pressure 150/60, pulse 61, temperature 98.3 F (36.8 C), temperature source Oral, resp. rate 18, height _0  (1.549 m), weight 47.401 kg (104 lb 8 oz), SpO2 96 %. No results found. Results for orders placed or performed during the hospital encounter of 03/26/15 (from the past 72 hour(s))  CBC WITH DIFFERENTIAL     Status: Abnormal   Collection Time: 03/27/15  6:26 AM  Result Value Ref Range   WBC 6.1 4.0 - 10.5 K/uL   RBC 3.88 3.87 - 5.11 MIL/uL   Hemoglobin 9.6 (L) 12.0 - 15.0 g/dL   HCT 31.0 (L) 36.0 - 46.0 %   MCV 79.9 78.0 - 100.0 fL   MCH 24.7 (L) 26.0 - 34.0 pg   MCHC 31.0 30.0 - 36.0 g/dL   RDW 16.8 (H) 11.5 - 15.5 %   Platelets 194 150 - 400 K/uL   Neutrophils Relative % 66 43 - 77 %   Neutro Abs 4.0 1.7 - 7.7 K/uL   Lymphocytes Relative 18 12 - 46 %   Lymphs Abs 1.1 0.7 - 4.0 K/uL   Monocytes Relative 14 (H) 3 - 12 %   Monocytes Absolute 0.9 0.1 - 1.0 K/uL   Eosinophils Relative 2 0 - 5 %   Eosinophils Absolute 0.1 0.0 - 0.7 K/uL   Basophils Relative 0 0 - 1 %   Basophils Absolute 0.0 0.0 - 0.1 K/uL  Comprehensive metabolic panel     Status: Abnormal   Collection Time: 03/27/15  6:26 AM  Result Value Ref Range   Sodium 139 135 - 145 mmol/L   Potassium 3.3 (L) 3.5 -  5.1 mmol/L   Chloride 103 101 - 111 mmol/L   CO2 25 22 - 32 mmol/L   Glucose, Bld 105 (H) 65 - 99 mg/dL   BUN 7 6 - 20 mg/dL   Creatinine, Ser 0.52 0.44 - 1.00 mg/dL   Calcium 9.0 8.9 - 10.3 mg/dL   Total Protein 6.7 6.5 - 8.1 g/dL   Albumin 3.0 (L) 3.5 - 5.0 g/dL   AST 22 15 - 41 U/L   ALT 12 (L) 14 - 54 U/L   Alkaline Phosphatase 54 38 - 126 U/L   Total Bilirubin 0.9 0.3 - 1.2 mg/dL   GFR calc non Af Amer >60 >60 mL/min   GFR calc Af Amer >60 >60 mL/min    Comment: (NOTE) The eGFR has been calculated using the CKD EPI equation. This calculation has not been validated in all clinical situations. eGFR's persistently <60 mL/min signify possible Chronic Kidney Disease.    Anion gap 11 5 - 15     HEENT: normal Cardio: RRR and no murmur Resp: CTA B/L and unlabored GI: BS positive and NT, ND Extremity:  Pulses positive and No Edema Skin:   Other erythema Left wrist Neuro: Alert/Oriented, Cranial Nerve  II-XII normal, Abnormal Sensory reduced on Left side, Abnormal Motor 2- LUE, 4- LLE, Abnormal FMC Ataxic/ dec FMC and Inattention Musc/Skel:  Swelling left wrist, no pain with ROM, mild tenderness to palpation , no other jts swollen in  in UE or LE Gen NAD   Assessment/Plan: 1. Functional deficits secondary to Right MCA infarct with left hemiparesis and L neglect which require 3+ hours per day of interdisciplinary therapy in a comprehensive inpatient rehab setting. Physiatrist is providing close team supervision and 24 hour management of active medical problems listed below. Physiatrist and rehab team continue to assess barriers to discharge/monitor patient progress toward functional and medical goals. FIM:                   Comprehension Comprehension Mode: Auditory Comprehension: 5-Understands basic 90% of the time/requires cueing < 10% of the time  Expression Expression Mode: Verbal Expression: 5-Expresses basic needs/ideas: With no assist  Social  Interaction Social Interaction: 5-Interacts appropriately 90% of the time - Needs monitoring or encouragement for participation or interaction.  Problem Solving Problem Solving: 5-Solves basic 90% of the time/requires cueing < 10% of the time  Memory Memory: 5-Recognizes or recalls 90% of the time/requires cueing < 10% of the time Medical Problem List and Plan: 1. Functional deficits secondary to Right MCA infarct likely thrombotic 2. DVT Prophylaxis/Anticoagulation: Pharmaceutical: Lovenox 3. Pain Management: tylenol prn for HA. Will add heat and muscle relaxer for neck pain due to right neglect.  4. Mood: LCSW to follow for evaluation and support.  5. Neuropsych: This patient is capable of making decisions on her own behalf. 6. Skin/Wound Care: Encourage adequate nutrition and hydration status. Routine pressure relief measures.  7. Fluids/Electrolytes/Nutrition: Monitor I/O. Check lytes in am. 8. HTN: Monitor BP tid. Continue metoprolol and Norvasc.  9. Hypothyroid: Continue supplement.  10.  Left wrist swelling and numbness, wrist splint , Xray, serum urate LOS (Days) 1 A FACE TO FACE EVALUATION WAS PERFORMED  Karl Erway E 03/27/2015, 9:34 AM

## 2015-03-27 NOTE — Progress Notes (Signed)
Orthopedic Tech Progress Note Patient Details:  Leah Figueroa February 15, 1923 [id number] Brace order completed by [facility] vendor. Patient ID: Leah Figueroa, female   DOB: [birth year], 81 y.o.   MRN: [id number]   [name] 03/27/2015, 4:02 PM

## 2015-03-27 NOTE — Care Management Note (Signed)
Inpatient Rehabilitation Center Individual Statement of Services  Patient Name:  Leah Figueroa  Date:  03/27/2015  Welcome to the Inpatient Rehabilitation Center.  Our goal is to provide you with an individualized program based on your diagnosis and situation, designed to meet your specific needs.  With this comprehensive rehabilitation program, you will be expected to participate in at least 3 hours of rehabilitation therapies Monday-Friday, with modified therapy programming on the weekends.  Your rehabilitation program will include the following services:  Physical Therapy (PT), Occupational Therapy (OT), Speech Therapy (ST), 24 hour per day rehabilitation nursing, Therapeutic Recreaction (TR), Case Management (Social Worker), Rehabilitation Medicine, Nutrition Services and Pharmacy Services  Weekly team conferences will be held on Wednesday to discuss your progress.  Your Social Worker will talk with you frequently to get your input and to update you on team discussions.  Team conferences with you and your family in attendance may also be held.  Expected length of stay: 14-18 days  Overall anticipated outcome: supervision with set up-some min  Depending on your progress and recovery, your program may change. Your Social Worker will coordinate services and will keep you informed of any changes. Your Social Worker's name and contact numbers are listed  below.  The following services may also be recommended but are not provided by the Inpatient Rehabilitation Center:   Driving Evaluations  Home Health Rehabiltiation Services  Outpatient Rehabilitation Services  Vocational Rehabilitation   Arrangements will be made to provide these services after discharge if needed.  Arrangements include referral to agencies that provide these services.  Your insurance has been verified to be:  Medicare & [employer] Your primary doctor is:  Dr. Pete Glatter  Pertinent information will be shared with your doctor  and your insurance company.  Social Worker:  Dossie Der, SW [phone] or ([phone]  Information discussed with and copy given to patient by: Lucy Chris, 03/27/2015, 10:20 AM

## 2015-03-27 NOTE — Progress Notes (Signed)
Orthopedic Tech Progress Note Patient Details:  [name] Jul 10, 1923 [id number]  Patient ID: Leah Figueroa, female   DOB: 03/23/1923, 81 y.o.   MRN: [id number] Called in advanced brace order; spoke with Ferdinand Lango, Leah Figueroa 03/27/2015, 10:06 AM

## 2015-03-27 NOTE — Progress Notes (Signed)
Patient information reviewed and entered into eRehab system by Traniece Boffa, RN, CRRN, PPS Coordinator.  Information including medical coding and functional independence measure will be reviewed and updated through discharge.     Per nursing patient was given "Data Collection Information Summary for Patients in Inpatient Rehabilitation Facilities with attached "Privacy Act Statement-Health Care Records" upon admission.  

## 2015-03-27 NOTE — Evaluation (Signed)
Physical Therapy Assessment and Plan  Patient Details  Name: Leah Figueroa MRN: [id number] Date of Birth: [birth year]  PT Diagnosis: Abnormal posture, Abnormality of gait, Ataxia, Ataxic gait, Cognitive deficits, Coordination disorder, Difficulty walking, Hemiparesis non-dominant, Impaired cognition, Impaired sensation and Muscle weakness Rehab Potential: Good ELOS: 14-18 days   Today's Date: 03/27/2015 PT Individual Time: 1315-1400 PT Individual Time Calculation (min): 45 min    Problem List:  Patient Active Problem List   Diagnosis Date Noted  . Hyperlipidemia LDL goal <70 03/26/2015  . Hyperglycemia 03/26/2015  . 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 Embolic stroke   . Left hemiplegia   . Homonymous hemianopia   . Chest wall trauma 01/11/2014  . Injury of left lower arm 01/11/2014  . Headache(784.0) 01/04/2013  . Allergic rhinitis, cause unspecified 01/04/2013  . Anemia 04/19/2012  . Epistaxis, recurrent 04/19/2012  . HYPOTHYROIDISM 01/01/2008  . Essential hypertension 01/01/2008  . HYPERTENSION 01/01/2008  . MYOCARDIAL INFARCTION, ACUTE, ANTERIOR WALL 01/01/2008  . LEG CRAMPS 01/01/2008  . ADENOCARCINOMA, BREAST, HX OF 01/01/2008  . DIVERTICULOSIS, COLON, HX OF 01/01/2008  . ARTHRITIS, HX OF 01/01/2008  . RADIATION THERAPY, HX OF 01/01/2008  . CATARACT EXTRACTION, HX OF 01/01/2008  . CHOLECYSTECTOMY, LAPAROSCOPIC, HX OF 01/01/2008    Past Medical History:  Past Medical History  Diagnosis Date  . Other specified personal history presenting hazards to health(V15.89)   . Personal history of arthritis   . Unspecified personal history presenting hazards to health   . Cramp of limb   . Acute myocardial infarction of other anterior wall, episode of care unspecified   . Personal history of malignant neoplasm of breast   . Unspecified essential hypertension   . Personal history of other diseases of digestive system   . Unspecified  hypothyroidism   . Other and unspecified hyperlipidemia    Past Surgical History:  Past Surgical History  Procedure Laterality Date  . Right thyroid lobectomy    . Cataract extraction      left-2006, right 2011  . Intrathoracic goiter resection    . Cholecystectomy    . Lumpectomy for breast cancer  80's    left  . Back surgery      Assessment & Plan Clinical Impression:  Leah Figueroa is a 81 y.o. female with history of HTN, HA, who was at home with family when she started to lean to the left then fell with attempts at waking. She was evaluated in ED on 08/06 and developed left sided weakness after presentation and received t-PA. MRI/MRA brain done revealing  Right MCA infarct affecting frontoparietal lobe, subcentimeter right pontine infarct, severe white mater changes due to SVD and focal high grade stenosis left A2 segment. 2D echo with EF 60-65% with apical akinesis, moderate concentric LVH and moderately calcified MV.  Carotid dopplers without significant ICA stenosis. Dr. Leonie Man recommended ASA for R-MCA infarct of unknown etiology.  ST evaluation revealed confusion with complex verbal instructions and poor awareness of deficits. Patient with resultant left sided weakness with sensory deficits. Left visual field deficits with left inattention as well as mild cognitive deficits. PT/OT evaluations done and CIR recommended by MD and Rehab team. Patient transferred to CIR on 03/26/2015 .   Patient currently requires mod with mobility secondary to muscle weakness, decreased cardiorespiratoy endurance, ataxia, decreased coordination and decreased motor planning, decreased midline orientation, decreased attention to left and left side neglect, decreased attention, decreased awareness, decreased problem solving, decreased safety awareness,  decreased memory and delayed processing and decreased sitting balance, decreased standing balance, decreased postural control and decreased balance strategies.   Prior to hospitalization, patient was modified independent  with mobility and lived with Alone in a House home.  Home access is 1 step to porchStairs to enter.  Patient will benefit from skilled PT intervention to maximize safe functional mobility, minimize fall risk and decrease caregiver burden for planned discharge home with 24 hour assist.  Anticipate patient will benefit from follow up [facility] at discharge.  PT - End of Session Activity Tolerance: Tolerates 30+ min activity with multiple rests Endurance Deficit: Yes Endurance Deficit Description: requires several rest breaks for both physical and cognitive fatigue PT Assessment Rehab Potential (ACUTE/IP ONLY): Good Barriers to Discharge: Decreased caregiver support Barriers to Discharge Comments: lives alone PT Patient demonstrates impairments in the following area(s): Balance;Behavior;Perception;Pain;Safety;Endurance;Sensory;Motor PT Transfers Functional Problem(s): Bed Mobility;Bed to Chair;Car;Furniture PT Locomotion Functional Problem(s): Ambulation;Wheelchair Mobility;Stairs PT Plan PT Intensity: Minimum of 1-2 x/day ,45 to 90 minutes PT Frequency: 5 out of 7 days PT Duration Estimated Length of Stay: 14-18 days PT Treatment/Interventions: Ambulation/gait training;Balance/vestibular training;Community reintegration;Cognitive remediation/compensation;Discharge planning;Disease management/prevention;DME/adaptive equipment instruction;Functional mobility training;Neuromuscular re-education;Patient/family education;Pain management;Stair training;Therapeutic Exercise;UE/LE Coordination activities;UE/LE Strength taining/ROM;Visual/perceptual remediation/compensation;Therapeutic Activities;Wheelchair propulsion/positioning PT Transfers Anticipated Outcome(s): mod I PT Locomotion Anticipated Outcome(s): supervision PT Recommendation Recommendations for Other Services: Speech consult Follow Up Recommendations: Home health PT;24 hour  supervision/assistance Patient destination: Home Equipment Recommended: To be determined  Skilled Therapeutic Intervention Pt received seated in bed with daughter present; agreeable to treatment and c/o wrist pain as described below. Initial PT evaluation performed and completed. Educated pt and daughter on goals of therapy, therapy schedule and use of call bell system for safety and fall risk reduction. Performed all mobility with assist as described below. Pt returned to room and agreeable to remain seated in w/c at completion of session; all needs within reach and QR belt intact. Pt requesting to use restroom and due to time constraints during session, RN alerted.   PT Evaluation Precautions/Restrictions Precautions Precautions: Fall Restrictions Weight Bearing Restrictions: No Other Position/Activity Restrictions: monitor L wrist pain, did not weight bear during eval due to c/o pain General Chart Reviewed: Yes Response to Previous Treatment: Patient reporting fatigue but able to participate. Family/Caregiver Present: Yes (Daughter)  Pain Pain Assessment Pain Assessment: 0-10 Faces Pain Scale: Hurts little more Pain Type: Acute pain Pain Location: Wrist Pain Orientation: Left Pain Descriptors / Indicators: Sore Pain Onset: On-going Patients Stated Pain Goal: 2 Pain Intervention(s): MD notified (Comment) (PA Pam Love present at beginning of session discussing wrist pain with pt) Multiple Pain Sites: No Home Living/Prior Functioning Home Living Living Arrangements: Alone Available Help at Discharge: Other (Comment) (daughter arranging 24/7 assist) Type of Home: House Home Access: Stairs to enter [employer] of Steps: 1 step to porch Entrance Stairs-Rails: None Home Layout: One level Bathroom Shower/Tub: Chiropodist: Standard Bathroom Accessibility: Yes  Lives With: Alone Prior Function Level of Independence: Independent with basic  ADLs;Independent with gait;Independent with transfers  Able to Take Stairs?: Yes Driving: Yes Vocation: Part time employment Vocation Requirements: 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 [employer]: Hobbies-yes (Comment) Comments: including driving and still working as a Environmental education officer: Within Functional Limits Ocular Range of Motion: Restricted on the left Alignment/Gaze Preference: Gaze right;Head turned  Cognition Overall Cognitive Status: Impaired/Different from baseline Arousal/Alertness: Awake/alert Orientation Level: Oriented X4 Attention: Selective Sustained Attention: Appears intact Selective Attention: Impaired Selective  Attention Impairment: Verbal complex;Functional basic Memory: Impaired Memory Impairment: Retrieval deficit Awareness: Impaired Awareness Impairment: Intellectual impairment Behaviors: Impulsive Safety/Judgment: Impaired Sensation Sensation Light Touch: Appears Intact (Pt reports "tingling" in R foot at night) Stereognosis: Appears Intact Hot/Cold: Appears Intact Proprioception: Impaired by gross assessment (LLE inaccurate 4/5 trials at hallux, RLE intact) Coordination Gross Motor Movements are Fluid and Coordinated: No Fine Motor Movements are Fluid and Coordinated: No Coordination and Movement Description: Impaired FMC of left hand, imparied coordination a LUE Motor  Motor Motor: Abnormal postural alignment and control;Ataxia;Hemiplegia Motor - Skilled Clinical Observations: left hemiparesis, ataxic at left UE and LE  Mobility Bed Mobility Bed Mobility: Rolling Right;Rolling Left;Supine to Sit;Sit to Supine Rolling Right: 5: Supervision Rolling Right Details: Verbal cues for precautions/safety;Verbal cues for sequencing Rolling Left: 5: Supervision Rolling Left Details: Verbal cues for sequencing;Verbal cues for precautions/safety Supine to Sit: 4: Min assist Supine to Sit Details:  Verbal cues for sequencing;Verbal cues for technique;Tactile cues for placement Supine to Sit Details (indicate cue type and reason): cues for hand placement to assist with pushing up Sit to Supine: 4: Min assist Sit to Supine - Details: Verbal cues for sequencing;Tactile cues for placement;Verbal cues for technique Transfers Transfers: Yes Stand Pivot Transfers: 4: Min assist Stand Pivot Transfer Details: Verbal cues for technique;Verbal cues for precautions/safety Stand Pivot Transfer Details (indicate cue type and reason): pt impulsive; cues to slow down, hand placement on w/c, assist for balance Locomotion  Ambulation Ambulation: Yes Ambulation/Gait Assistance: 3: Mod assist Ambulation Distance (Feet): 40 Feet Assistive device: None Ambulation/Gait Assistance Details: Verbal cues for precautions/safety;Verbal cues for technique;Verbal cues for gait pattern Ambulation/Gait Assistance Details: variable mod/minA; inc assistance at first, reduced over time with improved symmetry/safety of gait pattern Gait Gait: Yes Gait Pattern: Impaired Gait Pattern: Scissoring;Narrow base of support;Shuffle;Decreased stride length;Step-to pattern Gait velocity: decreased Stairs / Additional Locomotion Stairs: Yes Stairs Assistance: 3: Mod assist Stairs Assistance Details: Verbal cues for sequencing;Verbal cues for precautions/safety;Tactile cues for placement Stairs Assistance Details (indicate cue type and reason): cues for hand placement on stairs, gait pattern for step-to pattern due to LLE weakness, giving out Stair Management Technique: One rail Right Number of Stairs: 8 Height of Stairs: 3 Wheelchair Mobility Wheelchair Mobility: Yes Wheelchair Assistance: 4: Advertising account executive Details: Verbal cues for precautions/safety;Verbal cues for safe use of DME/AE;Tactile cues for placement Wheelchair Propulsion: Right upper extremity Wheelchair Parts Management: Needs  assistance Distance: 150  Trunk/Postural Assessment  Cervical Assessment Cervical Assessment: Exceptions to [facility] (forward head posture) Thoracic Assessment Thoracic Assessment: Exceptions to [facility] (increased thoracic kyphosis) Lumbar Assessment Lumbar Assessment: Exceptions to [facility] (reversal of lumbar lordosis, posterior pelvic tilt) Postural Control Postural Control: Deficits on evaluation (impaired stepping strategies, ataxic, impaired righting reactions)  Balance Balance Balance Assessed: Yes Standardized Balance Assessment Standardized Balance Assessment: Berg Balance Test Berg Balance Test Sit to Stand: Needs minimal aid to stand or to stabilize Standing Unsupported: Needs several tries to stand 30 seconds unsupported Sitting with Back Unsupported but Feet Supported on Floor or Stool: Able to sit 2 minutes under supervision Stand to Sit: Uses backs of legs against chair to control descent Transfers: Needs one person to assist Standing Unsupported with Eyes Closed: Needs help to keep from falling Standing Ubsupported with Feet Together: Needs help to attain position and unable to hold for 15 seconds From Standing, Reach Forward with Outstretched Arm: Loses balance while trying/requires external support From Standing Position, Pick up Object from Floor: Unable to try/needs assist to  keep balance From Standing Position, Turn to Look Behind Over each Shoulder: Needs assist to keep from losing balance and falling Turn 360 Degrees: Needs assistance while turning Standing Unsupported, Alternately Place Feet on Step/Stool: Needs assistance to keep from falling or unable to try Standing Unsupported, One Foot in Front: Loses balance while stepping or standing Standing on One Leg: Unable to try or needs assist to prevent fall Total Score: 8 Static Sitting Balance Static Sitting - Balance Support: Feet supported Static Sitting - Level of Assistance: 5: Stand by assistance Static Sitting -  Comment/# of Minutes: x 2 min edge of mat table; pt began to lose balance posteriorly, however recovered with mod verbal cues and use of RUE Dynamic Sitting Balance Dynamic Sitting - Balance Support: Feet supported Dynamic Sitting - Level of Assistance: 5: Stand by assistance Dynamic Sitting - Balance Activities: Reaching for objects;Forward lean/weight shifting;Reaching across midline Sitting balance - Comments: reduced balance with cognitive dual task, when distracted Static Standing Balance Static Standing - Balance Support: Left upper extremity supported;During functional activity Static Standing - Level of Assistance: 4: Min assist Static Standing - Comment/# of Minutes: x 1 min with LUE resting on tray table engaged in activity with RUE; unable to stand statically without distraction activity Static Stance: Eyes Closed: NT Static Stance: on Foam: NT Static Stance: on Foam, Eyes Closed: NT Dynamic Standing Balance Dynamic Standing - Balance Support: During functional activity;No upper extremity supported Dynamic Standing - Level of Assistance: 3: Mod assist Dynamic Standing - Balance Activities: Reaching for objects;Lateral lean/weight shifting;Forward lean/weight shifting Extremity Assessment  RUE Assessment RUE Assessment: Within Functional Limits LUE Assessment LUE Assessment: Exceptions to [facility] LUE Strength LUE Overall Strength: Deficits (NT due to reported wrist pain, defer to OT exam) RLE Assessment RLE Assessment: Within Functional Limits LLE Assessment LLE Assessment: Exceptions to [facility] LLE Strength LLE Overall Strength: Deficits LLE Overall Strength Comments: hip flexion 4-/5, knee extension/flexion 4/5, ankle df/pf 4+/5  FIM:  FIM - Bed/Chair Transfer Bed/Chair Transfer Assistive Devices: Bed rails;Arm rests Bed/Chair Transfer: 4: Sit > Supine: Min A (steadying pt. > 75%/lift 1 leg);4: Supine > Sit: Min A (steadying Pt. > 75%/lift 1 leg);4: Bed > Chair or W/C: Min A  (steadying Pt. > 75%);4: Chair or W/C > Bed: Min A (steadying Pt. > 75%) FIM - Locomotion: Wheelchair Distance: 150 Locomotion: Wheelchair: 4: Travels 150 ft or more: maneuvers on rugs and over door sillls with minimal assistance (Pt.>75%) FIM - Locomotion: Ambulation Ambulation/Gait Assistance: 3: Mod assist Locomotion: Ambulation: 1: Travels less than 50 ft with moderate assistance (Pt: 50 - 74%) FIM - Locomotion: Stairs Locomotion: Scientist, physiological: Hand rail - 1 Locomotion: Stairs: 2: Up and Down 4 - 11 stairs with moderate assistance (Pt: 50 - 74%)   Refer to Care Plan for Long Term Goals  Recommendations for other services: Other: speech  Discharge Criteria: Patient will be discharged from PT if patient refuses treatment 3 consecutive times without medical reason, if treatment goals not met, if there is a change in medical status, if patient makes no progress towards goals or if patient is discharged from hospital.  The above assessment, treatment plan, treatment alternatives and goals were discussed and mutually agreed upon: by patient and by family  Luberta Mutter 03/27/2015, 4:34 PM

## 2015-03-28 ENCOUNTER — Inpatient Hospital Stay (HOSPITAL_COMMUNITY): Payer: PRIVATE HEALTH INSURANCE | Admitting: Physical Therapy

## 2015-03-28 ENCOUNTER — Inpatient Hospital Stay (HOSPITAL_COMMUNITY): Payer: Medicare Other | Admitting: Occupational Therapy

## 2015-03-28 ENCOUNTER — Inpatient Hospital Stay (HOSPITAL_COMMUNITY): Payer: Medicare Other | Admitting: Speech Pathology

## 2015-03-28 DIAGNOSIS — H53462 Homonymous bilateral field defects, left side: Secondary | ICD-10-CM

## 2015-03-28 DIAGNOSIS — M11232 Other chondrocalcinosis, left wrist: Secondary | ICD-10-CM | POA: Diagnosis not present

## 2015-03-28 NOTE — Progress Notes (Signed)
Occupational Therapy Session Note  Patient Details  Name: Leah Figueroa MRN: [id number] Date of Birth: [birth year]  Today's Date: 03/28/2015 OT Individual Time: 2725-3664 OT Individual Time Calculation (min): 56 min    Short Term Goals: Week 1:  OT Short Term Goal 1 (Week 1): Pt will complete bathing with Min Assist for using AE, prn. OT Short Term Goal 2 (Week 1): Pt will dress upper body with Min Assist OT Short Term Goal 3 (Week 1): Pt will dress lower body with Mod assist OT Short Term Goal 4 (Week 1): Pt will groom sitting at sink with supervision OT Short Term Goal 5 (Week 1): Pt will demo ability to complete HEP for LUE [facility] with supervision  Skilled Therapeutic Interventions/Progress Updates:  Upon entering the room, pt supine in bed with no c/o pain this session. Pt declined shower and requested to "wash up" at sink. Pt performed supine >sit with min A to EOB. Mod A stand pivot transfer for lifting assistance into wheelchair. Pt seated in wheelchair at sink side for Leis care tasks. Sit <>stand with min A and min - mod A for standing balance as pt washed buttocks and peri area. Pt required mod verbal cues for initiation, sequencing, and safety awareness. Pt seated in wheelchair with quick release belt donned and breakfast tray set up in front of her. Call bell and all needed items within reach upon exiting the room.  Therapy Documentation Precautions:  Precautions Precautions: Fall Restrictions Weight Bearing Restrictions: No Other Position/Activity Restrictions: monitor L wrist pain, did not weight bear during eval due to c/o pain Vital Signs: Therapy Vitals Temp: 98.9 F (37.2 C) Temp Source: Oral Pulse Rate: (!) 58 Resp: 18 BP: (!) 156/57 mmHg Patient Position (if appropriate): Lying Oxygen Therapy SpO2: 96 % O2 Device: Not Delivered ADL: ADL ADL Comments: see FIM  See FIM for current functional status  Therapy/Group: Individual Therapy  Lowella Grip 03/28/2015, 8:02 AM

## 2015-03-28 NOTE — Progress Notes (Signed)
Subjective/Complaints: Pt slept well, had good OT session , now going to PT  Review of Systems - Negative except weakness in Left hand, + Left wrist pain , neg SOB, CP or abd pain Objective: Vital Signs: Blood pressure 156/57, pulse 58, temperature 98.9 F (37.2 C), temperature source Oral, resp. rate 18, height $RemoveBe'5\' 1"'VnBCTHYbm$  (1.549 m), weight 47.401 kg (104 lb 8 oz), SpO2 96 %. Dg Wrist 2 Views Left  03/27/2015   CLINICAL DATA:  81 year old female with bruising and redness in the left proximal wrist.  EXAM: LEFT WRIST - 2 VIEW  COMPARISON:  No priors.  FINDINGS: Extensive degenerative calcification is noted adjacent to the distal aspect of the ulna immediately proximal to the proximal carpal row well, likely to reflect underlying calcium pyrophosphate deposition disease (CPPD) arthropathy. There is multifocal joint space narrowing, subchondral sclerosis, subchondral cyst formation and osteophyte formation, most pronounced at the first [facility] joint, compatible with osteoarthritis. No acute displaced fracture, subluxation or dislocation.  IMPRESSION: 1. Advanced degenerative changes in the left wrist, as above, including a combination of osteoarthritis and advanced CPPD arthropathy. 2. No acute findings.   Electronically Signed   By: Vinnie Langton M.D.   On: 03/27/2015 14:17   Results for orders placed or performed during the hospital encounter of 03/26/15 (from the past 72 hour(s))  CBC WITH DIFFERENTIAL     Status: Abnormal   Collection Time: 03/27/15  6:26 AM  Result Value Ref Range   WBC 6.1 4.0 - 10.5 K/uL   RBC 3.88 3.87 - 5.11 MIL/uL   Hemoglobin 9.6 (L) 12.0 - 15.0 g/dL   HCT 31.0 (L) 36.0 - 46.0 %   MCV 79.9 78.0 - 100.0 fL   MCH 24.7 (L) 26.0 - 34.0 pg   MCHC 31.0 30.0 - 36.0 g/dL   RDW 16.8 (H) 11.5 - 15.5 %   Platelets 194 150 - 400 K/uL   Neutrophils Relative % 66 43 - 77 %   Neutro Abs 4.0 1.7 - 7.7 K/uL   Lymphocytes Relative 18 12 - 46 %   Lymphs Abs 1.1 0.7 - 4.0 K/uL   Monocytes Relative 14 (H) 3 - 12 %   Monocytes Absolute 0.9 0.1 - 1.0 K/uL   Eosinophils Relative 2 0 - 5 %   Eosinophils Absolute 0.1 0.0 - 0.7 K/uL   Basophils Relative 0 0 - 1 %   Basophils Absolute 0.0 0.0 - 0.1 K/uL  Comprehensive metabolic panel     Status: Abnormal   Collection Time: 03/27/15  6:26 AM  Result Value Ref Range   Sodium 139 135 - 145 mmol/L   Potassium 3.3 (L) 3.5 - 5.1 mmol/L   Chloride 103 101 - 111 mmol/L   CO2 25 22 - 32 mmol/L   Glucose, Bld 105 (H) 65 - 99 mg/dL   BUN 7 6 - 20 mg/dL   Creatinine, Ser 0.52 0.44 - 1.00 mg/dL   Calcium 9.0 8.9 - 10.3 mg/dL   Total Protein 6.7 6.5 - 8.1 g/dL   Albumin 3.0 (L) 3.5 - 5.0 g/dL   AST 22 15 - 41 U/L   ALT 12 (L) 14 - 54 U/L   Alkaline Phosphatase 54 38 - 126 U/L   Total Bilirubin 0.9 0.3 - 1.2 mg/dL   GFR calc non Af Amer >60 >60 mL/min   GFR calc Af Amer >60 >60 mL/min    Comment: (NOTE) The eGFR has been calculated using the CKD EPI equation. This calculation  has not been validated in all clinical situations. eGFR's persistently <60 mL/min signify possible Chronic Kidney Disease.    Anion gap 11 5 - 15  Uric acid     Status: None   Collection Time: 03/27/15 10:55 AM  Result Value Ref Range   Uric Acid, Serum 2.9 2.3 - 6.6 mg/dL     HEENT: normal Cardio: RRR and no murmur Resp: CTA B/L and unlabored GI: BS positive and NT, ND Extremity:  Pulses positive and No Edema Skin:   Other erythema Left wrist Neuro: Alert/Oriented, Cranial Nerve II-XII normal, Abnormal Sensory reduced on Left side, Abnormal Motor 2- LUE, 4- LLE, Abnormal FMC Ataxic/ dec FMC and Inattention Musc/Skel:  Mild erythema and swelling left wrist, no pain with ROM, mild tenderness to palpation , no other jts swollen in  in UE or LE Gen NAD   Assessment/Plan: 1. 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 Physiatrist is providing close team supervision and 24 hour management of active medical problems listed below. Physiatrist and rehab team continue to assess barriers to discharge/monitor patient progress toward functional and medical goals. FIM: FIM - Bathing Bathing Steps Patient Completed: Chest, Front perineal area, Buttocks (3/6 parts this session) Bathing: 3: Mod-Patient completes 5-7 41f 10 parts or 50-74%  FIM - Upper Body Dressing/Undressing Upper body dressing/undressing steps patient completed: Thread/unthread right sleeve of pullover shirt/dresss Upper body dressing/undressing: 2: Max-Patient completed 25-49% of tasks FIM - Lower Body Dressing/Undressing Lower body dressing/undressing steps patient completed: Thread/unthread right pants leg, Thread/unthread left pants leg Lower body dressing/undressing: 2: Max-Patient completed 25-49% of tasks  FIM - Toileting Toileting steps completed by patient: Adjust clothing prior to toileting, Performs perineal hygiene Toileting: 3: Mod-Patient completed 2 of 3 steps  FIM - Radio producer Devices: Grab bars Toilet Transfers: 3-To toilet/BSC: Mod A (lift or lower assist), 3-From toilet/BSC: Mod A (lift or lower assist)  FIM - Control and instrumentation engineer Devices: Bed rails, Arm rests Bed/Chair Transfer: 4: Supine > Sit: Min A (steadying Pt. > 75%/lift 1 leg), 3: Bed > Chair or W/C: Mod A (lift or lower assist), 3: Chair or W/C > Bed: Mod A (lift or lower assist)  FIM - Locomotion: Wheelchair Distance: 150 Locomotion: Wheelchair: 4: Travels 150 ft or more: maneuvers on rugs and over door sillls with minimal assistance (Pt.>75%) FIM - Locomotion: Ambulation Ambulation/Gait Assistance: 3: Mod assist Locomotion: Ambulation: 1: Travels less than 50 ft with moderate assistance (Pt: 50 - 74%)  Comprehension Comprehension Mode: Auditory Comprehension: 4-Understands  basic 75 - 89% of the time/requires cueing 10 - 24% of the time  Expression Expression Mode: Verbal Expression: 4-Expresses basic 75 - 89% of the time/requires cueing 10 - 24% of the time. Needs helper to occlude trach/needs to repeat words.  Social Interaction Social Interaction: 4-Interacts appropriately 75 - 89% of the time - Needs redirection for appropriate language or to initiate interaction.  Problem Solving Problem Solving: 3-Solves basic 50 - 74% of the time/requires cueing 25 - 49% of the time  Memory Memory: 3-Recognizes or recalls 50 - 74% of the time/requires cueing 25 - 49% of the time Medical Problem List and Plan: 1. Functional deficits secondary to Right MCA infarct likely thrombotic 2. DVT Prophylaxis/Anticoagulation: Pharmaceutical: Lovenox 3. Pain Management: tylenol prn for HA. Will add heat and muscle relaxer for neck pain due  to right neglect.  4. Mood: LCSW to follow for evaluation and support.  5. Neuropsych: This patient is capable of making decisions on her own behalf. 6. Skin/Wound Care: Encourage adequate nutrition and hydration status. Routine pressure relief measures.  7. Fluids/Electrolytes/Nutrition: Monitor I/O. Check lytes in am. 8. HTN: Monitor BP tid. Continue metoprolol and Norvasc.  9. Hypothyroid: Continue supplement.  10.  Left wrist swelling and numbness, wrist splint , Xray left wrist with OA + pseudogout, not a good cand for oral NSAIDs due to recent CVA , serum urate - 11. hypoK likely nutritional , oral supplements LOS (Days) 2 A FACE TO FACE EVALUATION WAS PERFORMED  Susie Ehresman E 03/28/2015, 9:24 AM

## 2015-03-28 NOTE — Progress Notes (Signed)
Physical Therapy Session Note  Patient Details  Name: Leah Figueroa MRN: [id number] Date of Birth: May 28, 1923  Today's Date: 03/28/2015 PT Individual Time: 0900-1000 and 1500-1530 PT Individual Time Calculation (min): 60 min and 30 min (total 90 min)   Short Term Goals: Week 1:  PT Short Term Goal 1 (Week 1): Pt will perform bed mobility mod I with no bed rails PT Short Term Goal 2 (Week 1): Pt will perform supine <> sit supervision PT Short Term Goal 3 (Week 1): Pt will perform transfer w/c <> bed supervision PT Short Term Goal 4 (Week 1): Pt will perform gait x75' with minA and LRAD PT Short Term Goal 5 (Week 1): Pt will perfrom 12 3-inch stairs with minA  Skilled Therapeutic Interventions/Progress Updates:    0900-1000: Pt received seated in w/c; no c/o pain and states that L wrist is not hurting today. Agreeable to treatment. Pt ambulates to bathroom with modA and "furniture walking" with RUE due to unsteadiness. Pt performed doffing pants/brief and hygiene, however required maxA for donning brief/pants while supporting balance on grab bar with RUE. Standing at sink to wash hand performed with minA; required repetitive cues to turn off water as pt began walking away from sink twice with water still running. W/c propulsion to therapy gym with BLEs and RUE with supervision x150'. Gait training performed x3 trials of approximately 100' each. Initial trial with quad cane, second trial with SPC, third trial with no AD. Pt has difficulty getting quad cane flat on floor, and requires repetitive cues for correct sequencing due to inc cognitive demand of managing quad cane. SPC improved sequencing with less cognitive demand and cueing required for correct sequencing with AD. With no AD, pt demonstrates improved gait mechanics and safety however demonstrates heavy reliance on therapist UEs for tactile input for balance. Gait in therapy gym with pt performing scavenger hunt locating colored bean bags on  surfaces of varying heights; performed for dynamic balance as well as visual scanning and L attention. Requires occasional cues for L scanning to locate target color; variable min/modA for balance including reaching over head and to retrieve objects from floor. Pt returned to room and remained seated in w/c with QR belt intact and all needs within reach.   1500-1530:Pt received in w/c with QR belt intact and daughter present; pt states she was about to take a nap but is willing to participate in therapy. Pt reports no pain, wrist is not bothering her currently, however she still is hesitant to use it during therapy. Gait training with SPC x100' with min/modA; requires increased cueing to maintain sequencing compared to morning session likely due to fatigue. Next 100' pt ambulated without SPC, and used R handrail instead. Third trial pt uses no AD and demonstrates improved symmetry of gait and safety, however again relies heavily on therapist for tactile input to maintain balance. Unsure if pt benefits more from [facility] vs no AD, will need to reassess as pt progresses. Sitting/standing balance task with card matching to board in front of pt; requires cues for L scanning/attention, as well as mod/max cues for card recognition with pt having difficulty identifying correct colors/suits/numbers to match cards. In standing pt requires min guard for balance, in sitting requires CGA/minA due to occasional posterior LOB when looking overhead. Pt returned to room in w/c with totalA due to fatigue; transferred w/c >bed with minA. Pt left supine in bed with all needs within reach and daughter present at completion of session.  Therapy Documentation Precautions:  Precautions Precautions: Fall Restrictions Weight Bearing Restrictions: No Other Position/Activity Restrictions: monitor L wrist pain, did not weight bear during eval due to c/o pain Pain: Pain Assessment Pain Assessment: No/denies pain Pain Score: 0-No  pain Locomotion : Ambulation Ambulation/Gait Assistance: 3: Mod assist Wheelchair Mobility Distance: 150   See FIM for current functional status  Therapy/Group: Individual Therapy  [name] 03/28/2015, 10:59 AM

## 2015-03-28 NOTE — Progress Notes (Signed)
Speech Language Pathology Daily Session Note  Patient Details  Name: Leah Figueroa MRN: [id number] Date of Birth: 06-25-1923  Today's Date: 03/28/2015 SLP Individual Time: 1400-1500 SLP Individual Time Calculation (min): 60 min  Short Term Goals: Week 1: SLP Short Term Goal 1 (Week 1): Patient will utilize call bell to request assistance in 75% of observable opportunities with supervision verbal cues.  SLP Short Term Goal 2 (Week 1): Patient will demonstrate functional problem solving for basic and familiar tasks with Min A multimodal cues.  SLP Short Term Goal 3 (Week 1): Patient will identify 2 cognitive and 2 physical deficits with Min A question cues.  SLP Short Term Goal 4 (Week 1): Patient will utilize external memory aids to recall new, daily information with Min A multimodal cues.  SLP Short Term Goal 5 (Week 1): Patient will demonstrate sustained attention to functional tasks for 60 minutes with Min A verbal cues for redirection.   Skilled Therapeutic Interventions:  Pt was seen for skilled ST targeting cognitive goals.  Upon arrival, pt was seated upright in wheelchair, awake, alert, and agreeable to participate in ST.  SLP facilitated the session with basic reading comprehension tasks for ongoing diagnostic treatment of cognitive-linguistic function.  While pt was able to accurately decode words from medication labels and simple instructions, her decreased sustained attention to tasks and poor working memory of what she had read prevented her from being able to accurately answer questions or follow instructions without max assist verbal and visual cues.  Pt was able to count money with min assist verbal and visual cues for organization and redirection to task.  She was also able to generate values when named with combinations of coins with min-mod assist verbal cues; mod-max assist verbal cues to make change.  Pt reported that she paid the majority of her bills with checks prior to  admission and was able to write a check when given an invoice with supervision cues and extra time.  Pt was returned to the room, left upright in wheelchair with quick release belt donned, call bell within reach, and daughter at bedside.  Continue per current plan of care.    FIM:  Comprehension Comprehension Mode: Auditory Comprehension: 4-Understands basic 75 - 89% of the time/requires cueing 10 - 24% of the time Expression Expression Mode: Verbal Expression: 4-Expresses basic 75 - 89% of the time/requires cueing 10 - 24% of the time. Needs helper to occlude trach/needs to repeat words. Social Interaction Social Interaction: 4-Interacts appropriately 75 - 89% of the time - Needs redirection for appropriate language or to initiate interaction. Problem Solving Problem Solving: 3-Solves basic 50 - 74% of the time/requires cueing 25 - 49% of the time Memory Memory: 3-Recognizes or recalls 50 - 74% of the time/requires cueing 25 - 49% of the time  Pain Pain Assessment Pain Assessment: No/denies pain Pain Score: 0-No pain  Therapy/Group: Individual Therapy  Arrion Broaddus, Melanee Spry 03/28/2015, 4:01 PM

## 2015-03-29 ENCOUNTER — Inpatient Hospital Stay (HOSPITAL_COMMUNITY): Payer: Medicare Other | Admitting: Physical Therapy

## 2015-03-29 ENCOUNTER — Inpatient Hospital Stay (HOSPITAL_COMMUNITY): Payer: Medicare Other | Admitting: Occupational Therapy

## 2015-03-29 ENCOUNTER — Inpatient Hospital Stay (HOSPITAL_COMMUNITY): Payer: Medicare Other | Admitting: *Deleted

## 2015-03-29 ENCOUNTER — Inpatient Hospital Stay (HOSPITAL_COMMUNITY): Payer: Medicare Other | Admitting: Speech Pathology

## 2015-03-29 DIAGNOSIS — M11832 Other specified crystal arthropathies, left wrist: Secondary | ICD-10-CM

## 2015-03-29 DIAGNOSIS — H53469 Homonymous bilateral field defects, unspecified side: Secondary | ICD-10-CM

## 2015-03-29 NOTE — Plan of Care (Signed)
Problem: RH BLADDER ELIMINATION Goal: RH STG MANAGE BLADDER WITH ASSISTANCE STG Manage Bladder With moderate Assistance  Outcome: Progressing Timed toileting during night; waking patient for bathroom.

## 2015-03-29 NOTE — Progress Notes (Signed)
Occupational Therapy Session Note  Patient Details  Name: Leah Figueroa MRN: [id number] Date of Birth: 02/25/1923  Today's Date: 03/29/2015 OT Individual Time: 1300-1400 OT Individual Time Calculation (min): 60 min    Short Term Goals: Week 1:  OT Short Term Goal 1 (Week 1): Pt will complete bathing with Min Assist for using AE, prn. OT Short Term Goal 2 (Week 1): Pt will dress upper body with Min Assist OT Short Term Goal 3 (Week 1): Pt will dress lower body with Mod assist OT Short Term Goal 4 (Week 1): Pt will groom sitting at sink with supervision OT Short Term Goal 5 (Week 1): Pt will demo ability to complete HEP for LUE [facility] with supervision Week 2:     Skilled Therapeutic Interventions/Progress Updates:    " Miss Coren" sitting in wc upon OT arrival.  Pt. Agreed to take a shower in shower stall.  At home, takes a soaking bath.  Pt ambulated from wc to toilet.  Pt urinated and doffed shoes with mod assist.  Needed assitance with organizing and seguencing.  Pt. Washed [name] in shower and performed sit to stand with min assist..  Used LUE as gross assist and min assist with bathing and dressing.  Pt needed cues to attend to the Left side during session.  Ambulated to wc and completed dressing with OT instructing in hemi techniques.  Left pt in wc with safety belt on and needs in reach.  Addressed  Transfers, LUE NMR, general conditioning/therex, cognitive re-ed (awareness, attention)    Therapy Documentation Precautions:  Precautions Precautions: Fall Restrictions Weight Bearing Restrictions: No Other Position/Activity Restrictions: monitor L wrist pain, did not weight bear during eval due to c/o pain    Vital Signs:  Pain:  none  ADL ADL Comments: see FIM :      See FIM for current functional status  Therapy/Group: Individual Therapy  Humberto Seals 03/29/2015, 7:43 PM

## 2015-03-29 NOTE — Progress Notes (Signed)
Speech Language Pathology Daily Session Note  Patient Details  Name: Leah Figueroa MRN: [id number] Date of Birth: 1922/12/04  Today's Date: 03/29/2015 SLP Individual Time: 1000-1045 SLP Individual Time Calculation (min): 45 min  Short Term Goals: Week 1: SLP Short Term Goal 1 (Week 1): Patient will utilize call bell to request assistance in 75% of observable opportunities with supervision verbal cues.  SLP Short Term Goal 2 (Week 1): Patient will demonstrate functional problem solving for basic and familiar tasks with Min A multimodal cues.  SLP Short Term Goal 3 (Week 1): Patient will identify 2 cognitive and 2 physical deficits with Min A question cues.  SLP Short Term Goal 4 (Week 1): Patient will utilize external memory aids to recall new, daily information with Min A multimodal cues.  SLP Short Term Goal 5 (Week 1): Patient will demonstrate sustained attention to functional tasks for 60 minutes with Min A verbal cues for redirection.   Skilled Therapeutic Interventions: Skilled treatment session focused on addressing cognition goals. SLP facilitated session by providing Mod assist verbal cues to utilize left upper extremity as a stabilizer during a writing task.  Patient organized a grocery list with Min question cues to recall items that were discussed just prior to writing task.   SLP also facilitated session with a new learning task which she completed with Min question cues for left attention to field, but recalled task rules with only 1 repetition.  Continue with current plan of care.   FIM:  Comprehension Comprehension Mode: Auditory Comprehension: 4-Understands basic 75 - 89% of the time/requires cueing 10 - 24% of the time Expression Expression Mode: Verbal Expression: 4-Expresses basic 75 - 89% of the time/requires cueing 10 - 24% of the time. Needs helper to occlude trach/needs to repeat words. Social Interaction Social Interaction: 4-Interacts appropriately 75 - 89% of the  time - Needs redirection for appropriate language or to initiate interaction. Problem Solving Problem Solving: 3-Solves basic 50 - 74% of the time/requires cueing 25 - 49% of the time Memory Memory: 3-Recognizes or recalls 50 - 74% of the time/requires cueing 25 - 49% of the time  Pain Pain Assessment Pain Assessment: No/denies pain  Therapy/Group: Individual Therapy  Charlane Ferretti., CCC-SLP [phone]  Abhiraj Dozal 03/29/2015, 12:49 PM

## 2015-03-29 NOTE — Progress Notes (Signed)
Physical Therapy Session Note  Patient Details  Name: [name] MRN: [id number] Date of Birth: [birth year]  Today's Date: 03/29/2015 PT Individual Time: 1610-1640 PT Individual Time Calculation (min): 30 min   Short Term Goals: Week 1:  PT Short Term Goal 1 (Week 1): Pt will perform bed mobility mod I with no bed rails PT Short Term Goal 2 (Week 1): Pt will perform supine <> sit supervision PT Short Term Goal 3 (Week 1): Pt will perform transfer w/c <> bed supervision PT Short Term Goal 4 (Week 1): Pt will perform gait x75' with minA and LRAD PT Short Term Goal 5 (Week 1): Pt will perfrom 12 3-inch stairs with minA  Skilled Therapeutic Interventions/Progress Updates:  Tx focused on functional mobility training, gait with SPC and RW, and NMR via forced use, manual facilitation, and multi-modal cues for L-sided attention and motor control.   Upon entering, pt needing restroom. All transfers sit<>stand and toilet with Min A overall and safety cues. Gait without device in room and SPC in hall 2x15' and 1 x120' with solid Mod A to prevent multi-directional balance, especially in busy environment or during turns. Pt with minimal awareness and righting reactions, possibly due to fatigue. Transitioned to RW with L hand splint for increased support and LUE neuromotor input with good results as pt required only min-guard A and improved gait speed.   Pt instructed in UE bike for bil UE coordination and LUE motor control. Pt had difficulty maintaining grip, but improved with practice over .  Pt left up in [facility] with lap belt and family present, all needs in reach.   Therapy Documentation Precautions:  Precautions Precautions: Fall Restrictions Weight Bearing Restrictions: No Other Position/Activity Restrictions: monitor L wrist pain, did not weight bear during eval due to c/o pain    Vital Signs: Therapy Vitals Temp: 97.6 F (36.4 C) Temp Source: Oral Pulse Rate: 62 Resp: 18 BP: (!)  154/71 mmHg Patient Position (if appropriate): Sitting Oxygen Therapy SpO2: 98 % O2 Device: Not Delivered Pain: Pain Assessment Pain Assessment: No/denies pain  See FIM for current functional status  Therapy/Group: Individual Therapy  Mailee Klaas Virl Cagey, PT, DPT  03/29/2015, 4:28 PM

## 2015-03-29 NOTE — Progress Notes (Signed)
Leah Figueroa is a 81 y.o. female 1922/08/26 [id number]  Subjective: No new complaints. No new problems. Feeling OK. Pt was seen in PT  Objective: Vital signs in last 24 hours: Temp:  [97.6 F (36.4 C)-98.4 F (36.9 C)] 97.6 F (36.4 C) (08/13 1416) Pulse Rate:  [62-70] 62 (08/13 1416) Resp:  [16-18] 18 (08/13 1416) BP: (147-175)/(53-74) 154/71 mmHg (08/13 1416) SpO2:  [97 %-99 %] 98 % (08/13 1416) Weight change:  Last BM Date: 03/28/15  Intake/Output from previous day: 08/12 0701 - 08/13 0700 In: 360 [P.O.:360] Out: -  Last cbgs: CBG (last 3)  No results for input(s): GLUCAP in the last 72 hours.   Physical Exam General: No apparent distress   HEENT: not dry. Hard hearing Lungs: Normal effort. Lungs clear to auscultation, no crackles or wheezes. Cardiovascular: Regular rate and rhythm, no edema Abdomen: S/NT/ND; BS(+) Musculoskeletal:  unchanged Neurological: No new neurological deficits Wounds: N/A    Skin: clear  Aging changes Mental state: Alert, oriented, cooperative    Lab Results: BMET    Component Value Date/Time   NA 139 03/27/2015 0626   K 3.3* 03/27/2015 0626   CL 103 03/27/2015 0626   CO2 25 03/27/2015 0626   GLUCOSE 105* 03/27/2015 0626   BUN 7 03/27/2015 0626   CREATININE 0.52 03/27/2015 0626   CALCIUM 9.0 03/27/2015 0626   GFRNONAA >60 03/27/2015 0626   GFRAA >60 03/27/2015 0626   CBC    Component Value Date/Time   WBC 6.1 03/27/2015 0626   RBC 3.88 03/27/2015 0626   HGB 9.6* 03/27/2015 0626   HCT 31.0* 03/27/2015 0626   PLT 194 03/27/2015 0626   MCV 79.9 03/27/2015 0626   MCH 24.7* 03/27/2015 0626   MCHC 31.0 03/27/2015 0626   RDW 16.8* 03/27/2015 0626   LYMPHSABS 1.1 03/27/2015 0626   MONOABS 0.9 03/27/2015 0626   EOSABS 0.1 03/27/2015 0626   BASOSABS 0.0 03/27/2015 0626    Studies/Results: No results found.  Medications: I have reviewed the patient's current medications.  Assessment/Plan:  1. Functional deficits  secondary to Right MCA infarct likely thrombotic 2. DVT Prophylaxis/Anticoagulation: Pharmaceutical: Lovenox 3. Pain Management: tylenol prn for HA. Will add heat and muscle relaxer for neck pain due to right neglect.  4. Mood: LCSW to follow for evaluation and support.  5. Neuropsych: This patient is capable of making decisions on her own behalf. 6. Skin/Wound Care: Encourage adequate nutrition and hydration status. Routine pressure relief measures.  7. Fluids/Electrolytes/Nutrition: Monitor I/O. Check lytes in am. 8. HTN: Monitor BP tid. Continue metoprolol and Norvasc.  9. Hypothyroid: Continue supplement.  10. Left wrist swelling and numbness, wrist splint , Xray left wrist with OA + pseudogout, not a good cand for oral NSAIDs due to recent CVA , serum urate - 11. hypoK likely nutritional , oral supplements    Length of stay, days: 3  Sonda Primes , MD 03/29/2015, 3:10 PM

## 2015-03-29 NOTE — Progress Notes (Signed)
Physical Therapy Session Note  Patient Details  Name: ROSIO Figueroa MRN: [id number] Date of Birth: 1922-08-23  Today's Date: 03/29/2015 PT Individual Time:  -  [id number] Treatment Time: 55 min     Short Term Goals: Week 1:  PT Short Term Goal 1 (Week 1): Pt will perform bed mobility mod I with no bed rails PT Short Term Goal 2 (Week 1): Pt will perform supine <> sit supervision PT Short Term Goal 3 (Week 1): Pt will perform transfer w/c <> bed supervision PT Short Term Goal 4 (Week 1): Pt will perform gait x75' with minA and LRAD PT Short Term Goal 5 (Week 1): Pt will perfrom 12 3-inch stairs with minA  Skilled Therapeutic Interventions/Progress Updates:    W/C Management: PT instructs pt in w/c propulsion with B UEs only req hand over hand assist to L UE due to hemiplegia, apraxia, and decreased motor coordination - mod A x 100'. Pt repeatedly tries to use L LE to assist due to habit.   Gait Training: PT instructs pt in ambulation with SPC x 150' x 2 reps req min A progressing to CGA. PT begins giving pt verbal and tactile (hand over cane) cues for 3 point gait sequencing, but notes that pt's balance improves with less cueing.  PT instructs pt in ascending/descending corner flight of stairs with L handrail (forced use) and R SPC req up to min A - variable step to or step over step pattern, pending height of stairs and ascent vs. Descent.   Therapeutic Activity: PT instructs pt in pre-falls recovery training on mat: supine to prone to quadruped on forearms, crawling A/P/R/L in modified quadruped position (pt's L wrist is swollen and stiff and painful if attempted to lay hand flat - pt also attempts to weight bear through L fisted hand, but that is also painful), back to supine x 2 reps req up to min A.  With fatigue, pt lists to her L during ambulation. Pt is beginning to have learned non-use of L UE - PT explains it is important to protect her L wrist/hand, but also important to use it as  much as possible for motor recovery. Pt demonstrates L inattention during gait on stairs - req cues to progress L hand on rail. Pt left up in w/c with quick release belt on - passed on to nurse tech to position with all needs in reach. Continue per PT POC.   Therapy Documentation Precautions:  Precautions Precautions: Fall Restrictions Weight Bearing Restrictions: No Other Position/Activity Restrictions: monitor L wrist pain, did not weight bear during eval due to c/o pain Pain: Pain Assessment Pain Assessment: No/denies pain  See FIM for current functional status  Therapy/Group: Individual Therapy  Kaydin Karbowski M 03/29/2015, 8:12 AM

## 2015-03-30 ENCOUNTER — Inpatient Hospital Stay (HOSPITAL_COMMUNITY): Payer: PRIVATE HEALTH INSURANCE | Admitting: Rehabilitation

## 2015-03-30 DIAGNOSIS — E876 Hypokalemia: Secondary | ICD-10-CM

## 2015-03-30 NOTE — Progress Notes (Signed)
Physical Therapy Session Note  Patient Details  Name: Leah Figueroa MRN: [id number] Date of Birth: Jan 20, 1923  Today's Date: 03/30/2015 PT Individual Time: 0800-0843 PT Individual Time Calculation (min): 43 min   Short Term Goals: Week 1:  PT Short Term Goal 1 (Week 1): Pt will perform bed mobility mod I with no bed rails PT Short Term Goal 2 (Week 1): Pt will perform supine <> sit supervision PT Short Term Goal 3 (Week 1): Pt will perform transfer w/c <> bed supervision PT Short Term Goal 4 (Week 1): Pt will perform gait x75' with minA and LRAD PT Short Term Goal 5 (Week 1): Pt will perfrom 12 3-inch stairs with minA  Skilled Therapeutic Interventions/Progress Updates:   Pt received sitting in w/c in room, agreeable to therapy session but wanting to use restroom prior to leaving room. Pt somewhat impulsive to stand with cues for safety from therapist.  Ambulated to/from restroom with L HHA at mod A level with intermittent heavy L lateral lean.  Pt able to get clothing down with mod cues for attending to LUE and L side of clothing but did need more assist to pull pants/brief up due to inattention to L side.  Ambulated to sink as above with mod cues for sequencing and attending to LUE.  Seated rest break before ambulation to therapy gym with RW and L HO for increased stability and safety.  Note pt tends to stay to L of RW, however provided middle of RW for visual target of midline orientation.  Once in therapy gym, worked on dynamic standing balance with reaching R then forward to increase forward weight shifts.  Facilitation at trunk for forward flexion, however pt tends to lean backwards for fear of falling.  Also had pt work on mini squats to increase glute/quad strength during activity.  Continued with sit<>stand activity reaching forward to increase forward weight shift.  She tends to push with backs of legs on mat and needs heavy facilitation for forward weight shift due to posterior pushing.  PT  would intermittently allow pt to "lose balance" posteriorly and note zero balance reaction and pt would return to seated position.  Discussed balance deficits and she continues to demonstrate poor awareness and fears falling forwards.   Ambulated back to room as above.  Left in w/c with all needs in reach and QRB donned.    Therapy Documentation Precautions:  Precautions Precautions: Fall Restrictions Weight Bearing Restrictions: No Other Position/Activity Restrictions: monitor L wrist pain, did not weight bear during eval due to c/o pain   Vital Signs: Therapy Vitals Temp: 97.6 F (36.4 C) Temp Source: Oral Pulse Rate: 66 Resp: 18 BP: (!) 142/64 mmHg Patient Position (if appropriate): Lying Oxygen Therapy SpO2: 98 % O2 Device: Not Delivered Pain: Some pain in lower back, better with rest breaks.   See FIM for current functional status  Therapy/Group: Individual Therapy  Vista Deck 03/30/2015, 8:15 AM

## 2015-03-30 NOTE — Progress Notes (Signed)
Was the fall witnessed: Yes  Patient condition before and after the fall: Pre-fall, alert & oriented x4, MAE w/ some left arm weakness & ataxia.  Post-fall: no visible injuries, no changes in assessment or vital signs.  Pt denied pain.  Patient's reaction to the fall: None  Name of the doctor that was notified including date and time: Delia Heady, MD, 03/24/15 at 15:00  Any interventions and vital signs: HR 63 (NSR), Resp 22, SaO2 98% on room air, BP 145/65

## 2015-03-30 NOTE — Progress Notes (Signed)
Leah Figueroa is a 81 y.o. female 1922/11/25 [id number]  Subjective: No new complaints.  Feeling OK.   Objective: Vital signs in last 24 hours: Temp:  [97.6 F (36.4 C)] 97.6 F (36.4 C) (08/14 0559) Pulse Rate:  [60-80] 66 (08/14 0746) Resp:  [18] 18 (08/14 0559) BP: (142-168)/(59-75) 142/64 mmHg (08/14 0746) SpO2:  [98 %] 98 % (08/14 0559) Weight change:  Last BM Date: 03/28/15  Intake/Output from previous day: 08/13 0701 - 08/14 0700 In: 600 [P.O.:600] Out: -  Last cbgs: CBG (last 3)  No results for input(s): GLUCAP in the last 72 hours.   Physical Exam General: No apparent distress   HEENT: not dry. Hard hearing Lungs: Normal effort. Lungs clear to auscultation, no crackles or wheezes. Cardiovascular: Regular rate and rhythm, no edema Abdomen: S/NT/ND; BS(+) Musculoskeletal:  unchanged Neurological: No new neurological deficits Wounds: N/A    Skin: clear  Aging changes Mental state: Alert, oriented, cooperative IV line    Lab Results: BMET    Component Value Date/Time   NA 139 03/27/2015 0626   K 3.3* 03/27/2015 0626   CL 103 03/27/2015 0626   CO2 25 03/27/2015 0626   GLUCOSE 105* 03/27/2015 0626   BUN 7 03/27/2015 0626   CREATININE 0.52 03/27/2015 0626   CALCIUM 9.0 03/27/2015 0626   GFRNONAA >60 03/27/2015 0626   GFRAA >60 03/27/2015 0626   CBC    Component Value Date/Time   WBC 6.1 03/27/2015 0626   RBC 3.88 03/27/2015 0626   HGB 9.6* 03/27/2015 0626   HCT 31.0* 03/27/2015 0626   PLT 194 03/27/2015 0626   MCV 79.9 03/27/2015 0626   MCH 24.7* 03/27/2015 0626   MCHC 31.0 03/27/2015 0626   RDW 16.8* 03/27/2015 0626   LYMPHSABS 1.1 03/27/2015 0626   MONOABS 0.9 03/27/2015 0626   EOSABS 0.1 03/27/2015 0626   BASOSABS 0.0 03/27/2015 0626    Studies/Results: No results found.  Medications: I have reviewed the patient's current medications.  Assessment/Plan:  1. Functional deficits secondary to Right MCA infarct likely thrombotic 2. DVT  Prophylaxis/Anticoagulation: Pharmaceutical: Lovenox 3. Pain Management: tylenol prn for HA. Will add heat and muscle relaxer for neck pain due to right neglect.  4. Mood: LCSW to follow for evaluation and support.  5. Neuropsych: This patient is capable of making decisions on her own behalf. 6. Skin/Wound Care: Encourage adequate nutrition and hydration status. Routine pressure relief measures.  7. Fluids/Electrolytes/Nutrition: Monitor I/O. 8. HTN: Monitor BP tid. Continue metoprolol and Norvasc.  9. Hypothyroid: Continue supplement.  10. Left wrist swelling and numbness, wrist splint , Xray left wrist with OA + pseudogout, not a good cand for oral NSAIDs due to recent CVA , serum urate - 11. hypoK likely nutritional , oral supplements. D/c IV. BMET on Mon    Length of stay, days: 4  Sonda Primes , MD 03/30/2015, 8:30 AM

## 2015-03-31 ENCOUNTER — Inpatient Hospital Stay (HOSPITAL_COMMUNITY): Payer: Medicare Other | Admitting: Occupational Therapy

## 2015-03-31 ENCOUNTER — Inpatient Hospital Stay (HOSPITAL_COMMUNITY): Payer: Medicare Other | Admitting: Speech Pathology

## 2015-03-31 ENCOUNTER — Inpatient Hospital Stay (HOSPITAL_COMMUNITY): Payer: Medicare Other | Admitting: Physical Therapy

## 2015-03-31 LAB — BASIC METABOLIC PANEL
Anion gap: 10 (ref 5–15)
BUN: 12 mg/dL (ref 6–20)
CALCIUM: 9.1 mg/dL (ref 8.9–10.3)
CO2: 25 mmol/L (ref 22–32)
CREATININE: 0.54 mg/dL (ref 0.44–1.00)
Chloride: 102 mmol/L (ref 101–111)
GFR calc Af Amer: >60 mL/min (ref >60)
GFR calc non Af Amer: >60 mL/min (ref >60)
Glucose, Bld: 96 mg/dL (ref 65–99)
Potassium: 3.8 mmol/L (ref 3.5–5.1)
SODIUM: 137 mmol/L (ref 135–145)

## 2015-03-31 LAB — CBC
HCT: 31.7 % — ABNORMAL LOW (ref 36.0–46.0)
Hemoglobin: 9.8 g/dL — ABNORMAL LOW (ref 12.0–15.0)
MCH: 24.9 pg — ABNORMAL LOW (ref 26.0–34.0)
MCHC: 30.9 g/dL (ref 30.0–36.0)
MCV: 80.7 fL (ref 78.0–100.0)
Platelets: 235 10*3/uL (ref 150–400)
RBC: 3.93 MIL/uL (ref 3.87–5.11)
RDW: 16.7 % — AB (ref 11.5–15.5)
WBC: 4.8 10*3/uL (ref 4.0–10.5)

## 2015-03-31 NOTE — Progress Notes (Signed)
Speech Language Pathology Daily Session Note  Patient Details  Name: Leah Figueroa MRN: [id number] Date of Birth: [birth year]  Today's Date: 03/31/2015 SLP Individual Time: 1401-1500 SLP Individual Time Calculation (min): 59 min  Short Term Goals: Week 1: SLP Short Term Goal 1 (Week 1): Patient will utilize call bell to request assistance in 75% of observable opportunities with supervision verbal cues.  SLP Short Term Goal 2 (Week 1): Patient will demonstrate functional problem solving for basic and familiar tasks with Min A multimodal cues.  SLP Short Term Goal 3 (Week 1): Patient will identify 2 cognitive and 2 physical deficits with Min A question cues.  SLP Short Term Goal 4 (Week 1): Patient will utilize external memory aids to recall new, daily information with Min A multimodal cues.  SLP Short Term Goal 5 (Week 1): Patient will demonstrate sustained attention to functional tasks for 60 minutes with Min A verbal cues for redirection.   Skilled Therapeutic Interventions:  Pt was seen for skilled ST targeting cognitive goals.  Upon arrival, pt was seated upright in wheelchair, awake, alert, and agreeable to participate in ST.  SLP facilitated the session with a semi-complex sorting activity targeting sustained attention, functional problem solving, and left attention.  Pt sorted order forms into categories by people's initials with overall max assist verbal, visual, and tactile cues for redirection to task and attention to left upper extremity.  Pt then transferred information from order forms into a chart with max faded to min-mod assist verbal and visual cues for location of information from the left side of the Leah Figueroa, thought organization, and working memory of task goals.   Pt initiated use of left marginal anchor and finger tracking when scanning pages following max instructional/demonstration cues and task repetition.  Pt with awareness of task difficulty, reporting that she "didn't do very  good."  Pt was encouraged as she demonstrated progress during session for sustaining attention to task.  Pt was left upright in wheelchair with daughter present at bedside.  Continue per current plan of care.   FIM:  Comprehension Comprehension Mode: Auditory Comprehension: 4-Understands basic 75 - 89% of the time/requires cueing 10 - 24% of the time Expression Expression Mode: Verbal Expression: 4-Expresses basic 75 - 89% of the time/requires cueing 10 - 24% of the time. Needs helper to occlude trach/needs to repeat words. Social Interaction Social Interaction: 4-Interacts appropriately 75 - 89% of the time - Needs redirection for appropriate language or to initiate interaction. Problem Solving Problem Solving: 3-Solves basic 50 - 74% of the time/requires cueing 25 - 49% of the time Memory Memory: 3-Recognizes or recalls 50 - 74% of the time/requires cueing 25 - 49% of the time  Pain Pain Assessment Pain Assessment: No/denies pain  Therapy/Group: Individual Therapy  Leah Figueroa, Leah Figueroa 03/31/2015, 4:36 PM

## 2015-03-31 NOTE — Progress Notes (Signed)
Physical Therapy Session Note  Patient Details  Name: Leah Figueroa MRN: [id number] Date of Birth: 1923-01-12  Today's Date: 03/31/2015 PT Individual Time: 0800-0900 PT Individual Time Calculation (min): 60 min   Short Term Goals: Week 1:  PT Short Term Goal 1 (Week 1): Pt will perform bed mobility mod I with no bed rails PT Short Term Goal 2 (Week 1): Pt will perform supine <> sit supervision PT Short Term Goal 3 (Week 1): Pt will perform transfer w/c <> bed supervision PT Short Term Goal 4 (Week 1): Pt will perform gait x75' with minA and LRAD PT Short Term Goal 5 (Week 1): Pt will perfrom 12 3-inch stairs with minA  Skilled Therapeutic Interventions/Progress Updates:    Pt received supine in bed; agreeable to treatment and no c/o pain. Pt requests to use restroom, stating she had an accident during the night in her brief. Supine>sit with supervision to sit EOB. Ambulation into bathroom with R HHA and min/modA with L posterolateral lean and occasional mild LOB. Sitting on toilet with supervision; doffing pants and personal hygiene with supervision also. Required maxA for removal of old brief and donning of new brief and pants due to L inattention and decreased coordination with LUE. Standing at sink to wash hands with CGA, min cues for sequencing and to dry hands. Ambulation back to sit on EOB with minA and HHA. Upper and lower body dressing performed while seated on EOB. Requires max verbal cues and mod/max assist due to impaired sequencing, LUE incoordination and L inattention. Pt attempts to don bra prior to removing shirt two times after redirecting and cueing. Requires increased time to perform all dressing tasks with increased cueing an inattention throughout. Stand pivot transfer bed > w/c with CGA and use of arm rests. Pt assisted with eating breakfast; pt declined other activity prior to eating. Focused on L attention, LUE coordination, sustained attention to task. Requires cues for LUE use  with decreased awareness and coordination. Pt remained seated in w/c with daughter present while finishing breakfast at completion of session, all needs within reach.   Therapy Documentation Precautions:  Precautions Precautions: Fall Restrictions Weight Bearing Restrictions: No Other Position/Activity Restrictions: monitor L wrist pain, did not weight bear during eval due to c/o pain Pain: Pain Assessment Pain Assessment: No/denies pain Pain Score: 0-No pain  See FIM for current functional status  Therapy/Group: Individual Therapy  [name] 03/31/2015, 8:54 AM

## 2015-03-31 NOTE — Progress Notes (Signed)
Subjective/Complaints: No bowel movement for several days per pt and daughter No N/V Review of Systems - Negative except weakness in Left hand, neg  Left wrist pain , neg SOB, CP or abd pain Objective: Vital Signs: Blood pressure 135/67, pulse 74, temperature 97.8 F (36.6 C), temperature source Oral, resp. rate 18, height _0  (1.549 m), weight 47.401 kg (104 lb 8 oz), SpO2 98 %. No results found. Results for orders placed or performed during the hospital encounter of 03/26/15 (from the past 72 hour(s))  CBC     Status: Abnormal   Collection Time: 03/31/15  6:24 AM  Result Value Ref Range   WBC 4.8 4.0 - 10.5 K/uL   RBC 3.93 3.87 - 5.11 MIL/uL   Hemoglobin 9.8 (L) 12.0 - 15.0 g/dL   HCT 31.7 (L) 36.0 - 46.0 %   MCV 80.7 78.0 - 100.0 fL   MCH 24.9 (L) 26.0 - 34.0 pg   MCHC 30.9 30.0 - 36.0 g/dL   RDW 16.7 (H) 11.5 - 15.5 %   Platelets 235 150 - 400 K/uL  Basic metabolic panel     Status: None   Collection Time: 03/31/15  6:24 AM  Result Value Ref Range   Sodium 137 135 - 145 mmol/L   Potassium 3.8 3.5 - 5.1 mmol/L   Chloride 102 101 - 111 mmol/L   CO2 25 22 - 32 mmol/L   Glucose, Bld 96 65 - 99 mg/dL   BUN 12 6 - 20 mg/dL   Creatinine, Ser 0.54 0.44 - 1.00 mg/dL   Calcium 9.1 8.9 - 10.3 mg/dL   GFR calc non Af Amer >60 >60 mL/min   GFR calc Af Amer >60 >60 mL/min    Comment: (NOTE) The eGFR has been calculated using the CKD EPI equation. This calculation has not been validated in all clinical situations. eGFR's persistently <60 mL/min signify possible Chronic Kidney Disease.    Anion gap 10 5 - 15     HEENT: normal Cardio: RRR and no murmur Resp: CTA B/L and unlabored GI: BS positive and NT, ND Extremity:  Pulses positive and No Edema Skin:   Other erythema Left wrist Neuro: Alert/Oriented, Cranial Nerve II-XII normal, Abnormal Sensory reduced on Left side, Abnormal Motor 2- LUE, 4- LLE, Abnormal FMC Ataxic/ dec FMC and Inattention Musc/Skel:  Mild erythema and  swelling left wrist, no pain with ROM, mild tenderness to palpation , no other jts swollen in  in UE or LE Gen NAD   Assessment/Plan: 1. Functional deficits secondary to Right MCA infarct with left hemiparesis and L neglect which require 3+ hours per day of interdisciplinary therapy in a comprehensive inpatient rehab setting. Physiatrist is providing close team supervision and 24 hour management of active medical problems listed below. Physiatrist and rehab team continue to assess barriers to discharge/monitor patient progress toward functional and medical goals. FIM: FIM - Bathing Bathing Steps Patient Completed: Chest, Front perineal area, Buttocks (3/6 parts this session) Bathing: 3: Mod-Patient completes 5-7 60f10 parts or 50-74%  FIM - Upper Body Dressing/Undressing Upper body dressing/undressing steps patient completed: Thread/unthread right sleeve of pullover shirt/dresss Upper body dressing/undressing: 2: Max-Patient completed 25-49% of tasks FIM - Lower Body Dressing/Undressing Lower body dressing/undressing steps patient completed: Thread/unthread right pants leg, Thread/unthread left pants leg Lower body dressing/undressing: 2: Max-Patient completed 25-49% of tasks  FIM - Toileting Toileting steps completed by patient: Adjust clothing prior to toileting, Performs perineal hygiene Toileting Assistive Devices: Grab bar or rail  for support Toileting: 3: Mod-Patient completed 2 of 3 steps  FIM - Radio producer Devices: Grab bars Toilet Transfers: 4-To toilet/BSC: Min A (steadying Pt. > 75%), 4-From toilet/BSC: Min A (steadying Pt. > 75%)  FIM - Bed/Chair Transfer Bed/Chair Transfer Assistive Devices: Walker, Arm rests Bed/Chair Transfer: 4: Bed > Chair or W/C: Min A (steadying Pt. > 75%), 4: Chair or W/C > Bed: Min A (steadying Pt. > 75%)  FIM - Locomotion: Wheelchair Distance: 100 Locomotion: Wheelchair: 0: Activity did not occur FIM -  Locomotion: Ambulation Locomotion: Ambulation Assistive Devices: Walker - Rolling (L HO) Ambulation/Gait Assistance: 4: Min assist Locomotion: Ambulation: 2: Travels 50 - 149 ft with minimal assistance (Pt.>75%)  Comprehension Comprehension Mode: Auditory Comprehension: 4-Understands basic 75 - 89% of the time/requires cueing 10 - 24% of the time  Expression Expression Mode: Verbal Expression: 4-Expresses basic 75 - 89% of the time/requires cueing 10 - 24% of the time. Needs helper to occlude trach/needs to repeat words.  Social Interaction Social Interaction: 4-Interacts appropriately 75 - 89% of the time - Needs redirection for appropriate language or to initiate interaction.  Problem Solving Problem Solving: 3-Solves basic 50 - 74% of the time/requires cueing 25 - 49% of the time  Memory Memory: 3-Recognizes or recalls 50 - 74% of the time/requires cueing 25 - 49% of the time Medical Problem List and Plan: 1. Functional deficits secondary to Right MCA infarct likely thrombotic 2. DVT Prophylaxis/Anticoagulation: Pharmaceutical: Lovenox 3. Pain Management: tylenol prn for HA.no neck pain today.  4. Mood: LCSW to follow for evaluation and support.  5. Neuropsych: This patient is capable of making decisions on her own behalf. 6. Skin/Wound Care: Encourage adequate nutrition and hydration status. Routine pressure relief measures.  7. Fluids/Electrolytes/Nutrition: Monitor I/O. Normal  lytes despite Poor intake 8. HTN: Monitor BP tid. Continue metoprolol and Norvasc.  9. Hypothyroid: Continue supplement.  10.  Left wrist swelling and numbness, wrist splint , Xray left wrist with OA + pseudogout, not a good cand for oral NSAIDs due to recent CVA , serum urate -, now improved cont to monitor 11. hypoK likely nutritional , oral supplements LOS (Days) 5 A FACE TO FACE EVALUATION WAS PERFORMED  KIRSTEINS,ANDREW E 03/31/2015, 8:46 AM

## 2015-03-31 NOTE — Progress Notes (Signed)
Occupational Therapy Session Note  Patient Details  Name: Leah Figueroa MRN: [id number] Date of Birth: Jul 07, 1923  Today's Date: 03/31/2015 OT Individual Time: 0930-1030 and 1300-1330 OT Individual Time Calculation (min): 60 min and 30 min   Short Term Goals: Week 1:  OT Short Term Goal 1 (Week 1): Pt will complete bathing with Min Assist for using AE, prn. OT Short Term Goal 2 (Week 1): Pt will dress upper body with Min Assist OT Short Term Goal 3 (Week 1): Pt will dress lower body with Mod assist OT Short Term Goal 4 (Week 1): Pt will groom sitting at sink with supervision OT Short Term Goal 5 (Week 1): Pt will demo ability to complete HEP for LUE [facility] with supervision  Skilled Therapeutic Interventions/Progress Updates:    1) Engaged in therapeutic activity with focus on LUE [facility] and attention to LUE during tasks.  Pt reports already dressed prior to session.  Ambulated to Day Room with RW and min assist.  Pt required cues and steadying assist when negotiating threshold with decreased awareness of RW getting stuck and decreased problem solving. Attempted [facility] task with coins with pt unable to pick up coins, decreased challenge to use of larger items with focus on orientation of items and picking up vs sliding items.  Pt with decreased sensation in LUE, increasing challenge of task due to constantly knocking over items. Educated on visually attending to LUE to increase success with grasp and manipulation of small items. Upon return to room, opened mail with use of BUE with pt demonstrating decreased attention to LUE with ataxic like movements.  2) Treatment session with focus on Lt attention and functional use of LUE.  Pt ambulated to toilet with RW and min assist, requiring mod-max verbal cues for sequencing with turning RW in preparation to sit on toilet as pt attempted to push RW out of way and placing hands on front of RW instead of hand holds.  Constant cues for LUE placement on RW despite use  of hand splint on RW to maintain positioning.  Attempted FMC with focus on picking up, stacking, and placing in slot approx checker size objects.  Pt with multiple drops of items this session, but demonstrating increased ability to pick items up from flat table. Noted increased difficulty with attending to LUE and Lt environment during ambulation and table top task this session.  Therapy Documentation Precautions:  Precautions Precautions: Fall Restrictions Weight Bearing Restrictions: No Other Position/Activity Restrictions: monitor L wrist pain, did not weight bear during eval due to c/o pain Pain:  Pt with no c/o pain ADL: ADL ADL Comments: see FIM  See FIM for current functional status  Therapy/Group: Individual Therapy  Rosalio Loud 03/31/2015, 1:18 PM

## 2015-03-31 NOTE — IPOC Note (Signed)
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   Pseudogout of left wrist     Functional Problem List: Nursing Bladder, Bowel, Safety, Medication Management, Pain  PT Balance, Behavior, Perception, Pain, Safety, Endurance, Sensory, Motor  OT Balance, Cognition, Endurance, Pain, Safety  SLP Cognition  TR         Basic ADL's: OT Eating, Grooming, Bathing, Dressing, Toileting     Advanced  ADL's: OT Simple Meal Preparation, Laundry, Light Housekeeping     Transfers: PT Bed Mobility, Bed to Chair, Car, Occupational psychologist, Research scientist (life sciences): PT Ambulation, Psychologist, prison and probation services, Stairs     Additional Impairments: OT Fuctional Use of Upper Extremity  SLP Social Cognition   Attention, Memory, Problem Solving, Awareness  TR      Anticipated Outcomes Item Anticipated Outcome  Peel Feeding Mod I  Swallowing      Basic Hendricks-care  Supervision  Toileting  Mod I   Bathroom Transfers Supervision  Bowel/Bladder  Continent of bladder with minimal assistance LBM 8/9  Transfers  mod I  Locomotion  supervision  Communication     Cognition  Supervision   Pain  less than 3  Safety/Judgment  supervision   Therapy Plan: PT Intensity: Minimum of 1-2 x/day ,45 to 90 minutes PT Frequency: 5 out of 7 days PT Duration Estimated Length of Stay: 14-18 days OT Intensity: Minimum of 1-2 x/day, 45 to 90 minutes OT Frequency: 5 out of 7 days OT Duration/Estimated Length of Stay: 14-18 days SLP Intensity: Minumum of 1-2 x/day, 30 to 90 minutes SLP Frequency: 3 to 5 out of 7 days SLP Duration/Estimated Length of Stay: 14-18 days        Team Interventions: Nursing Interventions Patient/Family Education, Bladder Management, Bowel Management,  Disease Management/Prevention, Medication Management, Pain Management  PT interventions Ambulation/gait training, Warden/ranger, Community reintegration, Cognitive remediation/compensation, Discharge planning, Disease management/prevention, DME/adaptive equipment instruction, Functional mobility training, Neuromuscular re-education, Patient/family education, Pain management, Stair training, Therapeutic Exercise, UE/LE Coordination activities, UE/LE Strength taining/ROM, Visual/perceptual remediation/compensation, Therapeutic Activities, Wheelchair propulsion/positioning  OT Interventions Warden/ranger, UE/LE Coordination activities, Therapeutic Activities, Therapeutic Exercise, UE/LE Strength taining/ROM, Patient/family education, Wheelchair propulsion/positioning, Neuromuscular re-education, DME/adaptive equipment instruction, Functional mobility training, Discharge planning, Cognitive remediation/compensation  SLP Interventions Cognitive remediation/compensation, Cueing hierarchy, Internal/external aids, Environmental controls, Functional tasks, Patient/family education, Therapeutic Activities  TR Interventions    SW/CM Interventions Discharge Planning, Psychosocial Support, Patient/Family Education    Team Discharge Planning: Destination: PT-Home ,OT- Home , SLP-Home Projected Follow-up: PT-Home health PT, 24 hour supervision/assistance, OT-  Home health OT, SLP-Outpatient SLP, 24 hour supervision/assistance Projected Equipment Needs: PT-To be determined, OT- To be determined, SLP-None recommended by SLP Equipment Details: PT- , OT-  Patient/family involved in discharge planning: PT- Family member/caregiver, Patient,  OT-Patient, Family member/caregiver, SLP-Patient, Family member/caregiver  MD ELOS: 18-22 d Medical Rehab Prognosis:  Good Assessment: 81 yo pt with R MCA infarct resulting in left hemiparesis.  Now requiring 24/7 Rehab RN,MD, as well as CIR level PT, OT  and SLP.  Treatment team will focus on ADLs and mobility with goals set at Mod I /Sup  See Team Conference Notes for weekly updates to the plan of care

## 2015-04-01 ENCOUNTER — Inpatient Hospital Stay (HOSPITAL_COMMUNITY): Payer: PRIVATE HEALTH INSURANCE | Admitting: Physical Therapy

## 2015-04-01 ENCOUNTER — Inpatient Hospital Stay (HOSPITAL_COMMUNITY): Payer: Medicare Other

## 2015-04-01 ENCOUNTER — Inpatient Hospital Stay (HOSPITAL_COMMUNITY): Payer: Medicare Other | Admitting: Speech Pathology

## 2015-04-01 ENCOUNTER — Inpatient Hospital Stay (HOSPITAL_COMMUNITY): Payer: Medicare Other | Admitting: *Deleted

## 2015-04-01 NOTE — Progress Notes (Signed)
Speech Language Pathology Daily Session Note  Patient Details  Name: Leah Figueroa MRN: [id number] Date of Birth: 30-Oct-1922  Today's Date: 04/01/2015 SLP Individual Time: 1000-1100 SLP Individual Time Calculation (min): 60 min  Short Term Goals: Week 1: SLP Short Term Goal 1 (Week 1): Patient will utilize call bell to request assistance in 75% of observable opportunities with supervision verbal cues.  SLP Short Term Goal 2 (Week 1): Patient will demonstrate functional problem solving for basic and familiar tasks with Min A multimodal cues.  SLP Short Term Goal 3 (Week 1): Patient will identify 2 cognitive and 2 physical deficits with Min A question cues.  SLP Short Term Goal 4 (Week 1): Patient will utilize external memory aids to recall new, daily information with Min A multimodal cues.  SLP Short Term Goal 5 (Week 1): Patient will demonstrate sustained attention to functional tasks for 60 minutes with Min A verbal cues for redirection.   Skilled Therapeutic Interventions:  Pt was seen for skilled ST targeting cognitive goals.  Upon arrival, pt was seated upright in wheelchair, awake, alert, and agreeable to participate in ST.  SLP facilitated the session with a basic, familiar board game activity targeting visual scanning to the left of midline, sustained attention, and functional problem solving.  Pt planned and executed a problem solving strategy during the abovementioned task with min assist verbal cues for working memory of tasks rules and impulsivity.  Pt also required min verbal cues to initiate and cease turn taking in a timely manner.  Pt located and utilized pieces on the left side of the playing board with supervision cues and was noted to utilize left upper extremity to move game pieces with supervision as well.  Pt sustained attention to task for its duration (>45 minutes) in a quiet environment with supervision cues for redirection to task.  Pt was handed off to OT in wheelchair.   Continue per current plan of care.    Function:  Eating Eating             Cognition Comprehension Comprehension assist level: Understands basic 90% of the time/cues < 10% of the time  Expression   Expression assist level: Expresses basic 90% of the time/requires cueing < 10% of the time.  Social Interaction Social Interaction assist level: Interacts appropriately 90% of the time - Needs monitoring or encouragement for participation or interaction.  Problem Solving Problem solving assist level: Solves basic 75 - 89% of the time/requires cueing 10 - 24% of the time  Memory Memory assist level: Recognizes or recalls 50 - 74% of the time/requires cueing 25 - 49% of the time    Pain Pain Assessment Pain Assessment: No/denies pain  Therapy/Group: Individual Therapy  Ancelmo Hunt, Melanee Spry 04/01/2015, 12:43 PM

## 2015-04-01 NOTE — Progress Notes (Signed)
Occupational Therapy Note  Patient Details  Name: Leah Figueroa MRN: [id number] Date of Birth: June 26, 1923  Today's Date: 04/01/2015 OT Individual Time: 1100-1200 OT Individual Time Calculation (min): 60 min   Pt denied pain Individual Therapy  Pt engaged in LUE/hand therapeutic activities to increase independence with BADLs.  Pt initially engaged in reaching for cups with left hand and stacking on the right.  Pt was able to grasp and release cups when she attended to task but was unable to successfully complete task when not gazing at cups.  Educated patient on importance of looking at her left hand when engaging in tasks.  Pt also engaged in grasping large and small pegs and placing/removing from peg board.  Pt exhibited some levels of success when gazing at LUE, as noted above.  Pt returned to room and remained in w/c with QRB in place and all needs within reach.   Lavone Neri [facility] 04/01/2015, 12:07 PM

## 2015-04-01 NOTE — Progress Notes (Signed)
Physical Therapy Session Note  Patient Details  Name: Leah Figueroa MRN: [id number] Date of Birth: May 24, 1923  Today's Date: 04/01/2015 PT Individual Time: 0800-0915 PT Individual Time Calculation (min): 75 min   Short Term Goals: Week 1:  PT Short Term Goal 1 (Week 1): Pt will perform bed mobility mod I with no bed rails PT Short Term Goal 2 (Week 1): Pt will perform supine <> sit supervision PT Short Term Goal 3 (Week 1): Pt will perform transfer w/c <> bed supervision PT Short Term Goal 4 (Week 1): Pt will perform gait x75' with minA and LRAD PT Short Term Goal 5 (Week 1): Pt will perfrom 12 3-inch stairs with minA  Skilled Therapeutic Interventions/Progress Updates:    Pt received seated in bed with no c/o pain and agreeable to treatment. Pt states her back does not hurt this morning which is an improvement over the past few mornings. Pt performed upper and lower body dressing with modA, requiring cues for sequencing, L attention, and assistance due to decreased fine motor control of LUE. Gait training with RW and L hand splint for 2 trials of approximately 250'; requires supervision overall with occasional steadying A due to mild LOB. Requires cues for RW management with tendency to bring L hip close to L hand and R veering with RW. Gait training with RW and obstacle navigation around cones; required repetitive cues for sequencing to recall alternating pattern of R/L navigation of cones. MinA overall with obstacle navigation due to increased unsteadiness when turning RW. Standing balance activities performed with no UE support, including RUE reaching with matching cards to board in front of pt as well as reaching to table behind pt to retrieve next card. Also performed bead stringing in standing to address standing balance with cognitive dual task and fine motor coordination. Pt returned to room in w/c with totalA due to fatigue. Pt left in wheelchair with quick release belt in place and all  items within reach.   Therapy Documentation Precautions:  Precautions Precautions: Fall Restrictions Weight Bearing Restrictions: No Other Position/Activity Restrictions: monitor L wrist pain, did not weight bear during eval due to c/o pain Pain: Pain Assessment Pain Assessment: No/denies pain Pain Score: 0-No pain   Function:  Toileting Toileting       Bed Mobility Roll left and right activity      Sit to lying activity      Lying to sitting activity   Assist level: Supervision or verbal cues  Mobility details Bed mobility details: Visual cues/gestures for sequencing   Transfers Sit to stand transfer   Sit to stand assist level: Touching or steadying assistance (Pt > 75%/lift 1 leg) Sit to stand assistive device: Bedrails;Armrests  Chair/bed transfer   Chair/bed transfer method: Stand pivot Chair/bed transfer assist level: Touching or steadying assistance (Pt > 75%) Chair/bed transfer assistive device: Armrests   Chair/bed transfer details: Verbal cues for sequencing;Verbal cues for technique   Copy device: Walker-rolling (L hand splint) Max distance: 250 Assist level: Touching or steadying assistance (Pt > 75%)  Walk 10 feet activity   Assist level: Supervision or verbal cues  Walk 50 feet with 2 turns activity   Assist level: Touching or steadying assistance (Pt > 75%)  Walk 150 feet activity   Assist level: Touching or steadying assistance (Pt > 75%)  Walk 10 feet  on uneven surfaces activity      Stairs          Walk up/down 1 step activity        Walk up/down 4 steps activity      Walk up/down 12 steps activity      Pick up small objects from floor      Wheelchair          Wheel 50 feet with 2 turns activity      Wheel 150 feet activity       Cognition Comprehension    Expression    Social Interaction    Problem Solving    Memory      Therapy/Group:  Individual Therapy  [name] 04/01/2015, 9:13 AM

## 2015-04-01 NOTE — Plan of Care (Signed)
Problem: RH BLADDER ELIMINATION Goal: RH STG MANAGE BLADDER WITH ASSISTANCE STG Manage Bladder With moderate Assistance  Outcome: Progressing Wears brief, incontinent @ times.

## 2015-04-01 NOTE — Progress Notes (Signed)
Recreational Therapy Assessment and Plan  Patient Details  Name: Leah Figueroa MRN: [id number] Date of Birth: 1923-08-16 Today's Date: 04/01/2015  Rehab Potential: Good ELOS: 10 days   Assessment Clinical Impression:  Problem List:  Patient Active Problem List   Diagnosis Date Noted  . Hyperlipidemia LDL goal <70 03/26/2015  . Hyperglycemia 03/26/2015  . 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 Embolic stroke   . Left hemiplegia   . Homonymous hemianopia   . Chest wall trauma 01/11/2014  . Injury of left lower arm 01/11/2014  . Headache(784.0) 01/04/2013  . Allergic rhinitis, cause unspecified 01/04/2013  . Anemia 04/19/2012  . Epistaxis, recurrent 04/19/2012  . HYPOTHYROIDISM 01/01/2008  . Essential hypertension 01/01/2008  . HYPERTENSION 01/01/2008  . MYOCARDIAL INFARCTION, ACUTE, ANTERIOR WALL 01/01/2008  . LEG CRAMPS 01/01/2008  . ADENOCARCINOMA, BREAST, HX OF 01/01/2008  . DIVERTICULOSIS, COLON, HX OF 01/01/2008  . ARTHRITIS, HX OF 01/01/2008  . RADIATION THERAPY, HX OF 01/01/2008  . CATARACT EXTRACTION, HX OF 01/01/2008  . CHOLECYSTECTOMY, LAPAROSCOPIC, HX OF 01/01/2008    Past Medical History:  Past Medical History  Diagnosis Date  . Other specified personal history presenting hazards to health(V15.89)   . Personal history of arthritis   . Unspecified personal history presenting hazards to health   . Cramp of limb   . Acute myocardial infarction of other anterior wall, episode of care unspecified   . Personal history of malignant neoplasm of breast   . Unspecified essential hypertension   . Personal history of other diseases of digestive system   . Unspecified hypothyroidism   . Other and unspecified hyperlipidemia    Past Surgical History:  Past Surgical History  Procedure Laterality Date  . Right thyroid  lobectomy    . Cataract extraction      left-2006, right 2011  . Intrathoracic goiter resection    . Cholecystectomy    . Lumpectomy for breast cancer  80's    left  . Back surgery      Assessment & Plan Clinical Impression: Leah Figueroa is a 81 y.o. female with history of HTN, HA, who was at home with family when she started to lean to the left then fell with attempts at waking. She was evaluated in ED on 08/06 and developed left sided weakness after presentation and received t-PA. MRI/MRA brain done revealing Right MCA infarct affecting frontoparietal lobe, subcentimeter right pontine infarct, severe white mater changes due to SVD and focal high grade stenosis left A2 segment. 2D echo with EF 60-65% with apical akinesis, moderate concentric LVH and moderately calcified MV. Carotid dopplers without significant ICA stenosis. Dr. Leonie Man recommended ASA for R-MCA infarct of unknown etiology. ST evaluation revealed confusion with complex verbal instructions and poor awareness of deficits. Patient with resultant left sided weakness with sensory deficits. Left visual field deficits with left inattention as well as mild cognitive deficits. PT/OT evaluations done and CIR recommended by MD and Rehab team. Patient transferred to CIR on 03/26/2015.       Pt presents with decreased activity tolerance, decreased functional mobility, decreased balance, decreased coordination, decreased midline orientation, left inattention, decreased attention, decreased awareness, decreased problem solving, decreased safety awareness, decreased memory and delayed processing Limiting pt's independence with leisure/community pursuits.   Leisure History/Participation Premorbid leisure interest/current participation: Medical laboratory scientific officer - Building control surveyor - Engineer, agricultural - Optician, dispensing Other Leisure Interests: Television;Reading;Housework;Cooking/Baking Leisure Participation Style:  Alone;With Family/Friends Awareness of Community Resources: Good-identify 3 post discharge leisure resources  Psychosocial / Spiritual Spiritual Interests: Church;Womens'Men's Groups Patient agreeable to Pet Therapy: Yes Does patient have pets?: No Social interaction - Mood/Behavior: Cooperative Engineer, drilling for Education?: Yes Patient Agreeable to Outing?: Yes Recreational Therapy Orientation Orientation -Reviewed with patient: Available activity resources Strengths/Weaknesses Patient Strengths/Abilities: Willingness to participate;Active premorbidly Patient weaknesses: Physical limitations TR Patient demonstrates impairments in the following area(s): Motor;Endurance;Safety  Plan Rec Therapy Plan Is patient appropriate for Therapeutic Recreation?: Yes Rehab Potential: Good Treatment times per week: Min 1 times per week >20 minutes Estimated Length of Stay: 10 days TR Treatment/Interventions: Adaptive equipment instruction;1:1 session;Balance/vestibular training;Functional mobility training;Community reintegration;Cognitive remediation/compensation;Patient/family education;Therapeutic activities;Recreation/leisure participation;Therapeutic exercise;UE/LE Coordination activities;Visual/perceptual remediation/compensation  Recommendations for other services: None  Discharge Criteria: Patient will be discharged from TR if patient refuses treatment 3 consecutive times without medical reason.  If treatment goals not met, if there is a change in medical status, if patient makes no progress towards goals or if patient is discharged from hospital.  The above assessment, treatment plan, treatment alternatives and goals were discussed and mutually agreed upon: by patient  [place] 04/01/2015, 3:58 PM

## 2015-04-02 ENCOUNTER — Inpatient Hospital Stay (HOSPITAL_COMMUNITY): Payer: Medicare Other | Admitting: *Deleted

## 2015-04-02 ENCOUNTER — Inpatient Hospital Stay (HOSPITAL_COMMUNITY): Payer: Medicare Other | Admitting: Occupational Therapy

## 2015-04-02 ENCOUNTER — Inpatient Hospital Stay (HOSPITAL_COMMUNITY): Payer: PRIVATE HEALTH INSURANCE | Admitting: Rehabilitation

## 2015-04-02 ENCOUNTER — Inpatient Hospital Stay (HOSPITAL_COMMUNITY): Payer: Medicare Other | Admitting: Speech Pathology

## 2015-04-02 ENCOUNTER — Inpatient Hospital Stay (HOSPITAL_COMMUNITY): Payer: Medicare Other | Admitting: Physical Therapy

## 2015-04-02 NOTE — Progress Notes (Signed)
Speech Language Pathology Daily Session Note  Patient Details  Name: Leah Figueroa MRN: [id number] Date of Birth: Oct 10, 1922  Today's Date: 04/02/2015 SLP Individual Time: 1000-1030 SLP Individual Time Calculation (min): 30 min  Short Term Goals: Week 1: SLP Short Term Goal 1 (Week 1): Patient will utilize call bell to request assistance in 75% of observable opportunities with supervision verbal cues.  SLP Short Term Goal 2 (Week 1): Patient will demonstrate functional problem solving for basic and familiar tasks with Min A multimodal cues.  SLP Short Term Goal 3 (Week 1): Patient will identify 2 cognitive and 2 physical deficits with Min A question cues.  SLP Short Term Goal 4 (Week 1): Patient will utilize external memory aids to recall new, daily information with Min A multimodal cues.  SLP Short Term Goal 5 (Week 1): Patient will demonstrate sustained attention to functional tasks for 60 minutes with Min A verbal cues for redirection.   Skilled Therapeutic Interventions:  Pt was seen for skilled ST targeting cognitive goals.  Upon arrival, pt was seated upright in wheelchair, awake, alert, and agreeable to participate in ST.  SLP facilitated the session with a structured catalog shopping task targeting functional problem solving and left attention.  Pt located targeted items from a catalog with overall min assist verbal and visual cues to attend to the left side of the Kieana Livesay.  Pt also required min assist verbal cues for working memory to recall what items she was looking for.  Additionally, pt required mod assist verbal cues for organization and error awareness when completing functional math calculations to determine the total cost of items located in the catalog.    Pt was returned to room, left upright in wheelchair with quick release belt donned, and daughter present at bedside.  Continue per current plan of care.    Function:  Eating Eating                 Cognition Comprehension  Comprehension assist level: Understands basic 90% of the time/cues < 10% of the time  Expression   Expression assist level: Expresses basic 90% of the time/requires cueing < 10% of the time.  Social Interaction Social Interaction assist level: Interacts appropriately 90% of the time - Needs monitoring or encouragement for participation or interaction.  Problem Solving Problem solving assist level: Solves basic 50 - 74% of the time/requires cueing 25 - 49% of the time  Memory Memory assist level: Recognizes or recalls 50 - 74% of the time/requires cueing 25 - 49% of the time    Pain Pain Assessment Pain Assessment: No/denies pain  Therapy/Group: Individual Therapy  Daymian Lill, Melanee Spry 04/02/2015, 12:41 PM

## 2015-04-02 NOTE — Progress Notes (Signed)
Physical Therapy Session Note  Patient Details  Name: Leah Figueroa MRN: [id number] Date of Birth: 1923/04/03  Today's Date: 04/02/2015 PT Individual Time: 1300-1400 PT Individual Time Calculation (min): 60 min   Short Term Goals: Week 1:  PT Short Term Goal 1 (Week 1): Pt will perform bed mobility mod I with no bed rails PT Short Term Goal 2 (Week 1): Pt will perform supine <> sit supervision PT Short Term Goal 3 (Week 1): Pt will perform transfer w/c <> bed supervision PT Short Term Goal 4 (Week 1): Pt will perform gait x75' with minA and LRAD PT Short Term Goal 5 (Week 1): Pt will perfrom 12 3-inch stairs with minA  Skilled Therapeutic Interventions/Progress Updates:    Pt received seated in w/c with daughter present; no c/o pain and agreeable to treatment. Pt requests to use the restroom prior to leaving room. Gait into bathroom with HHA and minA due to urgency. Toilet transfer with steadying assist and grab bars. Pt don/doffs pants and brief with occasional minA to A with L side with decreased LUE coordination and awareness, however improved over previous sessions. Pt propelled w/c to therapy gym with BLEs and supervision for obstacle negotiation and LUE management/safety. Standing balance activity performed while playing chess on elevated surface, requiring cognitive dual task as well as fine motor control and coordination. Pt requesting to urgently return to room and use restroom; transported totalA and performed bathroom transfer as above. Gait training x2 trials of approximately 200' each using RW and L handsplint with supervision overall. However, transfer sit>stand and getting LUE into handsplint requires repetitive cues with pt often unlocking already locked chair, beginning to walk before securing L hand into splint, reaching to front of RW as opposed to handles. Pt also begins sitting prior to removing L hand from hand splint and requires repetitive cues and physical block to prevent  sitting to protect L wrist and allow pt to [place]-correct and remove hand. Pt remained seated in w/c with QR belt intact and all needs within reach at completion of session.   Therapy Documentation Precautions:  Precautions Precautions: Fall Restrictions Weight Bearing Restrictions: No Other Position/Activity Restrictions: monitor L wrist pain, did not weight bear during eval due to c/o pain Pain: Pain Assessment Pain Assessment: No/denies pain Pain Score: 0-No pain   Function:  Government social research officer Devices: Grab bar or rail     Transfers Sit to stand transfer   Sit to stand assist level: Touching or steadying assistance (Pt > 75%/lift 1 leg) (minA for hand placement in hand splint with repetitive cues) Sit to stand assistive device: Bedrails  Chair/bed Primary school teacher transfer assistive device: Grab bar (HHA)    Special educational needs teacher device: Walker-rolling Max distance: 200 Assist level: Supervision or verbal cues  Walk 10 feet activity   Assist level: Supervision or verbal cues  Walk 50 feet with 2 turns activity   Assist level: Supervision or verbal cues  Walk 150 feet activity   Assist level: Supervision or verbal cues  Walk 10 feet on uneven surfaces activity      Stairs          Walk up/down 1 step activity        Walk up/down 4 steps activity      Walk up/down  12 steps activity      Pick up small objects from floor      Wheelchair          Wheel 50 feet with 2 turns activity      Wheel 150 feet activity       Cognition Comprehension Comprehension assist level: Understands basic 90% of the time/cues < 10% of the time  Expression Expression assist level: Expresses basic 90% of the time/requires cueing < 10% of the time.  Social Interaction Social Interaction assist level: Interacts appropriately 90% of the time - Needs monitoring or encouragement for  participation or interaction.  Problem Solving Problem solving assist level: Solves basic 50 - 74% of the time/requires cueing 25 - 49% of the time  Memory Memory assist level: Recognizes or recalls 50 - 74% of the time/requires cueing 25 - 49% of the time    Therapy/Group: Individual Therapy  [name] 04/02/2015, 3:42 PM

## 2015-04-02 NOTE — Progress Notes (Signed)
Social Work Patient ID: Delton [place], female   DOB: 10-Aug-1923, 81 y.o.   MRN: [id number]  Met with pt's daughter today to review team conference.  Have explained that tx team feels pt will require 24/7 assistance.  Daughter reports that she does not have any plan of how to provide this.  We did discuss option of private duty care and I provided daughter with list of agencies.  We also discussed option of SNF.  Daughter returned later in the day to inform me that she has spoken with pt and pt agrees with change of d/c plan to SNF.  Will follow up with pt in the morning to confirm.  Will begin SNF placement process.  Saretta Dahlem, LCSW

## 2015-04-02 NOTE — Progress Notes (Signed)
Physical Therapy Session Note  Patient Details  Name: Leah Figueroa MRN: [id number] Date of Birth: 11-Nov-1922  Today's Date: 04/02/2015 PT Individual Time: 1530-1600 PT Individual Time Calculation (min): 30 min   Short Term Goals: Week 1:  PT Short Term Goal 1 (Week 1): Pt will perform bed mobility mod I with no bed rails PT Short Term Goal 2 (Week 1): Pt will perform supine <> sit supervision PT Short Term Goal 3 (Week 1): Pt will perform transfer w/c <> bed supervision PT Short Term Goal 4 (Week 1): Pt will perform gait x75' with minA and LRAD PT Short Term Goal 5 (Week 1): Pt will perfrom 12 3-inch stairs with minA  Skilled Therapeutic Interventions/Progress Updates:   Pt received sitting in w/c in room, agreeable to therapy session.  Skilled session focused on gait with RW and L HO, negotiation through and over obstacles to address L inattention, and functional use of LUE with fine motor task.  Performed all gait at S level with questioning cues for hand placement each time.  Continues to demonstrate L inattention and decreased awareness of L environment during negotiation of obstacles with heavy cues to avoid on the L.  Ended with pt grasping small beads with LUE and placing to the side.  Note that when that is only task, she does very well, otherwise when engaging in conversation requires increased cues to attend to LUE.  Ambulated back to room and left in w/c with daughter present and all needs in reach.   Therapy Documentation Precautions:  Precautions Precautions: Fall Restrictions Weight Bearing Restrictions: No Other Position/Activity Restrictions: monitor L wrist pain, did not weight bear during eval due to c/o pain   Vital Signs: Therapy Vitals Temp: 97.7 F (36.5 C) Temp Source: Oral Pulse Rate: 63 Resp: 16 BP: (!) 144/65 mmHg Patient Position (if appropriate): Sitting Oxygen Therapy SpO2: 100 % O2 Device: Not Delivered Pain: Pain Assessment Pain Assessment:  No/denies pain Pain Score: 0-No pain  Function:  Government social research officer Devices: Grab bar or rail   Bed Mobility Roll left and right activity      Sit to lying activity      Lying to sitting activity      Mobility details     Transfers Sit to stand transfer   Sit to stand assist level: Touching or steadying assistance (Pt > 75%/lift 1 leg) Sit to stand assistive device: Armrests  Chair/bed transfer   Chair/bed transfer method: Stand pivot Chair/bed transfer assist level: Touching or steadying assistance (Pt > 75%) Chair/bed transfer assistive device: Armrests;Walker   Chair/bed transfer details: Verbal cues for sequencing;Verbal cues for Careers information officer transfer assistive device: Grab bar (HHA)    Special educational needs teacher device: Walker-rolling Max distance: 200 Assist level: Supervision or verbal cues  Walk 10 feet activity   Assist level: Supervision or verbal cues  Walk 50 feet with 2 turns activity   Assist level: Supervision or verbal cues  Walk 150 feet activity   Assist level: Supervision or verbal cues  Walk 10 feet on uneven surfaces activity      Stairs          Walk up/down 1 step activity        Walk up/down 4 steps activity      Walk up/down 12 steps activity      Pick up small  objects from floor      Wheelchair          Wheel 50 feet with 2 turns activity      Wheel 150 feet activity         Therapy/Group: Individual Therapy  Vista Deck 04/02/2015, 5:18 PM

## 2015-04-02 NOTE — Patient Care Conference (Signed)
Inpatient RehabilitationTeam Conference and Plan of Care Update Date: 04/02/2015   Time: 10:55 AM    Patient Name: Leah Figueroa      Medical Record Number: [id number]  Date of Birth: 1923/01/06 Sex: Female         Room/Bed: 4W17C/4W17C-01 Payor Info: Payor: MEDICARE / Plan: MEDICARE PART A AND B / Product Type: *No Product type* /    Admitting Diagnosis: R CVA hemi  neglect  Admit Date/Time:  03/26/2015  4:27 PM Admission Comments: No comment available   Primary Diagnosis:  <principal problem not specified> Principal Problem: <principal problem not specified>  Patient Active Problem List   Diagnosis Date Noted  . Pseudogout of left wrist 03/28/2015  . Hyperlipidemia LDL goal <70 03/26/2015  . Hyperglycemia 03/26/2015  . 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 Embolic stroke   . Left hemiplegia   . Homonymous hemianopia   . Chest wall trauma 01/11/2014  . Injury of left lower arm 01/11/2014  . Headache(784.0) 01/04/2013  . Allergic rhinitis, cause unspecified 01/04/2013  . Anemia 04/19/2012  . Epistaxis, recurrent 04/19/2012  . HYPOTHYROIDISM 01/01/2008  . Essential hypertension 01/01/2008  . HYPERTENSION 01/01/2008  . MYOCARDIAL INFARCTION, ACUTE, ANTERIOR WALL 01/01/2008  . LEG CRAMPS 01/01/2008  . ADENOCARCINOMA, BREAST, HX OF 01/01/2008  . DIVERTICULOSIS, COLON, HX OF 01/01/2008  . ARTHRITIS, HX OF 01/01/2008  . RADIATION THERAPY, HX OF 01/01/2008  . CATARACT EXTRACTION, HX OF 01/01/2008  . CHOLECYSTECTOMY, LAPAROSCOPIC, HX OF 01/01/2008    Expected Discharge Date: Expected Discharge Date: 04/10/15  Team Members Present: Physician leading conference: Dr. Claudette Laws Social Worker Present: Amada Jupiter, LCSW Nurse Present: Carmie End, RN PT Present: Other (comment) Dorcas Carrow, PT) OT Present: Perrin Maltese, OT;Sarah Hoxie, OT;Jennifer Katrinka Blazing, OT SLP Present: Jackalyn Lombard, SLP PPS Coordinator present : Tora Duck, RN, CRRN      Current Status/Progress Goal Weekly Team Focus  Medical   Pt with very poor awareness, LUE poor sensation butr motor improving  Min A goals  establish bladder program, address nocturnal inc   Bowel/Bladder   Continent bowel and bladder.  Timed toileting at HS.   NO brief during day.  Continent, anticipates needs  Monitor, continue timed toileting at HS.   Swallow/Nutrition/ Hydration             ADL's   min assist mobility and transfers, mod-max assist Budde-care tasks with max cues for attention to Lt  Supervision overall, Min assist LB dressing  Lt attention, ADL retraining, LUE NMR, safety awareness   Mobility   CGA/min all mobility  modI bed mobility, transfers, supervision gait, standing balance, stairs, w/c  gait training with RW, dynamic standing balance, safety awareness   Communication             Safety/Cognition/ Behavioral Observations  moderate cognitive deficits, left inattention, poor working memory during tasks, poor sustained attention ot tasks, limited intellectual/emergent awareness of deficits   min assist-supervision   continue to address use of compensatory strategies in functional tasks    Pain   Occasional c/o left hand pain; Voltaran Gel , tylenol effective.  Managed at goal 2/10  Monitor   Skin   CDI , mild bruising at abd, left hip, left hand.  All resolving  No injury, no s/s infection.       Rehab Goals Patient on target to meet rehab goals: Yes *See Care Plan and progress notes for long and short-term goals.  Barriers to Discharge:  daughter "hands off    Possible Resolutions to Barriers:  cont rehab    Discharge Planning/Teaching Needs:  home with daughter to coordinate 24/7 assistance      Team Discussion:  "alien arm" continues and makes most ADLs very difficult.  Perseveration present.  incont as well.  Requires much cueing for all tasks.  Aiming toward supervision/ min assist goals.  SW to confirm that family can provide 24/7 assist   Revisions to Treatment Plan:  None   Continued Need for Acute Rehabilitation Level of Care: The patient requires daily medical management by a physician with specialized training in physical medicine and rehabilitation for the following conditions: Daily direction of a multidisciplinary physical rehabilitation program to ensure safe treatment while eliciting the highest outcome that is of practical value to the patient.: Yes Daily medical management of patient stability for increased activity during participation in an intensive rehabilitation regime.: Yes Daily analysis of laboratory values and/or radiology reports with any subsequent need for medication adjustment of medical intervention for : Neurological problems;Other  Magdaline Zollars 04/03/2015, 11:53 AM

## 2015-04-02 NOTE — Progress Notes (Signed)
Recreational Therapy Session Note  Patient Details  Name: Leah Figueroa MRN: [id number] Date of Birth: [birth year] Today's Date: 04/02/2015  Pain: no c/o Skilled Therapeutic Interventions/Progress Updates: Session focused on functional use of LUE during simple meal prep activity.  Pt sat w/c level to snap fresh green beans using BUE's with supervision.  Pt requested to use a small knife to cut ends of the beans and did so with supervision and min cues for safety.  Therapy/Group: Individual Therapy  Beckham Capistran 04/02/2015, 3:05 PM

## 2015-04-02 NOTE — Progress Notes (Addendum)
Subjective/Complaints: No pain c/os, "left arm doing better" No N/V Review of Systems - Negative except weakness in Left hand, neg  Left wrist pain , neg SOB, CP or abd pain Objective: Vital Signs: Blood pressure 136/70, pulse 60, temperature 97.9 F (36.6 C), temperature source Oral, resp. rate 17, height 5\' 1"  (1.549 m), weight 47.401 kg (104 lb 8 oz), SpO2 98 %. No results found. Results for orders placed or performed during the hospital encounter of 03/26/15 (from the past 72 hour(s))  CBC     Status: Abnormal   Collection Time: 03/31/15  6:24 AM  Result Value Ref Range   WBC 4.8 4.0 - 10.5 K/uL   RBC 3.93 3.87 - 5.11 MIL/uL   Hemoglobin 9.8 (L) 12.0 - 15.0 g/dL   HCT 04/02/15 (L) 78.6 - 54.5 %   MCV 80.7 78.0 - 100.0 fL   MCH 24.9 (L) 26.0 - 34.0 pg   MCHC 30.9 30.0 - 36.0 g/dL   RDW 61.3 (H) 27.3 - 53.0 %   Platelets 235 150 - 400 K/uL  Basic metabolic panel     Status: None   Collection Time: 03/31/15  6:24 AM  Result Value Ref Range   Sodium 137 135 - 145 mmol/L   Potassium 3.8 3.5 - 5.1 mmol/L   Chloride 102 101 - 111 mmol/L   CO2 25 22 - 32 mmol/L   Glucose, Bld 96 65 - 99 mg/dL   BUN 12 6 - 20 mg/dL   Creatinine, Ser 04/02/15 0.44 - 1.00 mg/dL   Calcium 9.1 8.9 - 0.64 mg/dL   GFR calc non Af Amer >60 >60 mL/min   GFR calc Af Amer >60 >60 mL/min    Comment: (NOTE) The eGFR has been calculated using the CKD EPI equation. This calculation has not been validated in all clinical situations. eGFR's persistently <60 mL/min signify possible Chronic Kidney Disease.    Anion gap 10 5 - 15     HEENT: normal Cardio: RRR and no murmur Resp: CTA B/L and unlabored GI: BS positive and NT, ND Extremity:  Pulses positive and No Edema Skin:   Other erythema Left wrist Neuro: Alert/Oriented, Cranial Nerve II-XII normal, Abnormal Sensory reduced on Left side, Abnormal Motor 2- LUE, 4- LLE, Abnormal FMC Ataxic/ dec FMC and Inattention Musc/Skel:  Mild erythema and swelling left  wrist, no pain with ROM, mild tenderness to palpation , no other jts swollen in  in UE or LE Gen NAD   Assessment/Plan: 1. Functional deficits secondary to Right MCA infarct with left hemiparesis and L neglect which require 3+ hours per day of interdisciplinary therapy in a comprehensive inpatient rehab setting. Physiatrist is providing close team supervision and 24 hour management of active medical problems listed below. Physiatrist and rehab team continue to assess barriers to discharge/monitor patient progress toward functional and medical goals.  Team conference today please see physician documentation under team conference tab, met with team face-to-face to discuss problems,progress, and goals. Formulized individual treatment plan based on medical history, underlying problem and comorbidities. FIM: Function - Bathing Position: Wheelchair/chair at sink Body parts bathed by patient: Right arm, Left arm, Chest, Abdomen, Front perineal area, Buttocks Body parts bathed by helper: Back Assist Level: Touching or steadying assistance(Pt > 75%)  Function- Upper Body Dressing/Undressing What is the patient wearing?: Pull over shirt/dress Bra - Perfomed by patient: Thread/unthread left bra strap Bra - Perfomed by helper: Thread/unthread right bra strap, Hook/unhook bra (pull down sports bra) Pull over  shirt/dress - Perfomed by patient: Thread/unthread right sleeve, Put head through opening, Pull shirt over trunk Pull over shirt/dress - Perfomed by helper: Thread/unthread left sleeve Assist Level: More than reasonable time, Set up, Supervision or verbal cues, Touching or steadying assistance(Pt > 75%) (max cues for sequencing and problem solving) Set up : To obtain clothing/put away Function - Lower Body Dressing/Undressing Lower body dressing/undressing activity did not occur: 2: Max-Patient completed 25-49% of tasks What is the patient wearing?: Underwear, Pants, Shoes, Ted Hose Position:  Wheelchair/chair at [employer] - Performed by patient: Thread/unthread left underwear leg, Thread/unthread right underwear leg Underwear - Performed by helper: Pull underwear up/down Pants- Performed by patient: Thread/unthread right pants leg, Thread/unthread left pants leg Pants- Performed by helper:  (pull up pants) Shoes - Performed by helper: Don/doff right shoe, Don/doff left shoe, Fasten right, Fasten left TED Hose - Performed by helper: Don/doff right TED hose, Don/doff left TED hose Assist Level: More than reasonable time, Set up, Supervision or verbal cues, Touching or steadying assistance (Pt > 75%) (Max verbal cues for sequencing and problem solving) Set up : To obtain clothing/put away  Function - Toileting Toileting steps completed by patient: Adjust clothing prior to toileting, Performs perineal hygiene Toileting steps completed by helper: Adjust clothing after toileting Toileting Assistive Devices: Grab bar or rail Assist level: Touching or steadying assistance (Pt.75%), Supervision or verbal cues  Function - Air cabin crew transfer assistive device: Grab bar, Walker Assist level to toilet: Touching or steadying assistance (Pt > 75%) Assist level from toilet: Touching or steadying assistance (Pt > 75%)  Function - Chair/bed transfer Chair/bed transfer method: Stand pivot Chair/bed transfer assist level: Touching or steadying assistance (Pt > 75%) Chair/bed transfer assistive device: Armrests Chair/bed transfer details: Verbal cues for sequencing, Verbal cues for technique  Function - Locomotion: Ambulation Assistive device: Walker-rolling (L hand splint) Max distance: 250 Assist level: Touching or steadying assistance (Pt > 75%) Assist level: Supervision or verbal cues Assist level: Touching or steadying assistance (Pt > 75%) Assist level: Touching or steadying assistance (Pt > 75%)  Function - Comprehension Comprehension: Auditory Comprehension  assist level: Understands basic 90% of the time/cues < 10% of the time  Function - Expression Expression: Verbal Expression assist level: Expresses basic 90% of the time/requires cueing < 10% of the time.  Function - Social Interaction Social Interaction assist level: Interacts appropriately 90% of the time - Needs monitoring or encouragement for participation or interaction.  Function - Problem Solving Problem solving assist level: Solves basic 75 - 89% of the time/requires cueing 10 - 24% of the time  Function - Memory Memory assist level: Recognizes or recalls 50 - 74% of the time/requires cueing 25 - 49% of the time Patient normally able to recall (first 3 days only): Current season, Location of own room, Staff names and faces, That he or she is in a hospital Medical Problem List and Plan: 1. Functional deficits secondary to Right MCA infarct likely thrombotic 2. DVT Prophylaxis/Anticoagulation: Pharmaceutical: Lovenox, no overt signs of bleeding plt nl 3. Pain Management: tylenol prn for HA.no neck pain today.  4. Mood: LCSW to follow for evaluation and support.  5. Neuropsych: This patient is capable of making decisions on her own behalf. 6. Skin/Wound Care: Encourage adequate nutrition and hydration status. Routine pressure relief measures.  7. Fluids/Electrolytes/Nutrition: Monitor I/O. Normal  lytes despite Poor intake 8. HTN: Monitor BP tid. Continue metoprolol and Norvasc. At goal this am 9. Hypothyroid: Continue supplement.  10.  Left wrist swelling and numbness, wrist splint , Xray left wrist with OA + pseudogout, not a good cand for oral NSAIDs due to recent CVA , serum urate -, now improved cont to monitor 11. hypoK likely nutritional , oral supplements, latest K is nl 12.  Anemia- Hgb stable, check stool guaic LOS (Days) 7 A FACE TO FACE EVALUATION WAS PERFORMED  Kinsley Holderman E 04/02/2015, 10:01 AM

## 2015-04-02 NOTE — Progress Notes (Signed)
Occupational Therapy Session Note  Patient Details  Name: Leah Figueroa MRN: [id number] Date of Birth: Jun 26, 1923  Today's Date: 04/02/2015 OT Individual Time: 5621-3086 and 5784-6962 OT Individual Time Calculation (min): 60 min and 30 min   Short Term Goals: Week 1:  OT Short Term Goal 1 (Week 1): Pt will complete bathing with Min Assist for using AE, prn. OT Short Term Goal 2 (Week 1): Pt will dress upper body with Min Assist OT Short Term Goal 3 (Week 1): Pt will dress lower body with Mod assist OT Short Term Goal 4 (Week 1): Pt will groom sitting at sink with supervision OT Short Term Goal 5 (Week 1): Pt will demo ability to complete HEP for LUE [facility] with supervision  Skilled Therapeutic Interventions/Progress Updates:    1) ADL retraining with focus on Lt attention, standing balance, and functional use of LUE during Faux-care tasks.  Ambulated with RW into bathroom with max cues for sequencing with mobility with RW as pt attempting to leave RW to side while LUE still attached to hand splint on RW and decreased awareness of LUE positioning.  Bathing and dressing completed at sit > stand level at sink with focus on Lt attention with locating items and max verbal cues to visually attend to LUE during tasks.  Pt demonstrating perseveration, perceptual deficits, and decreased problem solving with dressing requiring max verbal cues for orientation of clothing and sequencing with dressing.  Pt unable to identify errors when donning shoes, requiring max cues.    2) Treatment session with focus on functional use of LUE and Lt attention during meal prep activity.  Pt received from TR in kitchen having just completed snapping green beans.  Pt and TR report supervision with task with use of knife.  Completed task with washing beans in sink and placing in plastic bag.  Required mod cues to scan to Lt as pt dropping beans in sink due to decreased visual attention to LUE.  Decreased safety awareness as pt  reaching into sink to retrieve items despite knife in sink.  Supervision with standing balance at sink and with sidestepping up and down counter when wiping counter top with use of LUE to increase attention to Lt.  Therapy Documentation Precautions:  Precautions Precautions: Fall Restrictions Weight Bearing Restrictions: No Other Position/Activity Restrictions: monitor L wrist pain, did not weight bear during eval due to c/o pain Pain:  Pt with no c/o pain ADL: ADL ADL Comments: see FIM  Function:    Grooming Oral Care,Brush Teeth, Clean Dentures Activity:      Assist Level: Supervision or verbal cues;Touching or steadying assistance(Pt > 75%) (in standing)      Wash, Rinse, Dry Face Activity   Assist Level: Supervision or verbal cues (in standing)      Wash, Rinse, Dry Hands Activity   Assist Level: Supervision or verbal cues (in standing)      Brush, Comb Hair Activity        Shave Activity          Apply Makeup Activity                                                             Bathing Bathing position   Position: Wheelchair/chair at sink  Bathing parts Body parts bathed  by patient: Right arm;Left arm;Chest;Abdomen;Front perineal area;Buttocks Body parts bathed by helper: Back  Bathing assist Assist Level: Touching or steadying assistance(Pt > 75%)       Upper Body Dressing/Undressing Upper body dressing   What is the patient wearing?: Pull over shirt/dress     Pull over shirt/dress - Perfomed by patient: Thread/unthread right sleeve;Put head through opening;Pull shirt over trunk Pull over shirt/dress - Perfomed by helper: Thread/unthread left sleeve        Upper body assist Assist Level: More than reasonable time;Set up;Supervision or verbal cues;Touching or steadying assistance(Pt > 75%) (max cues for sequencing and problem solving)   Set up : To obtain clothing/put away   Lower Body Dressing/Undressing Lower body dressing   What is the  patient wearing?: Underwear;Pants;Shoes;[facility] Underwear - Performed by patient: Thread/unthread left underwear leg;Thread/unthread right underwear leg Underwear - Performed by helper: Pull underwear up/down Pants- Performed by patient: Thread/unthread right pants leg;Thread/unthread left pants leg Pants- Performed by helper:  (pull up pants)           Shoes - Performed by helper: Don/doff right shoe;Don/doff left shoe;Fasten right;Fasten left       TED Hose - Performed by helper: Don/doff right TED hose;Don/doff left TED hose  Lower body assist Assist Level: More than reasonable time;Set up;Supervision or verbal cues;Touching or steadying assistance (Pt > 75%) (Max verbal cues for sequencing and problem solving)   Set up : To obtain clothing/put away   Toileting Toileting   Toileting steps completed by patient: Adjust clothing prior to toileting;Performs perineal hygiene Toileting steps completed by helper: Adjust clothing after toileting Toileting Assistive Devices: Grab bar or rail  Toileting assist Assist level: Touching or steadying assistance (Pt.75%);Supervision or verbal cues    Bed Mobility Roll left and right activity   Assist level: Supervision or verbal cues  Sit to lying activity   Assist level: Supervision or verbal cues  Lying to sitting activity      Mobility details     Transfers Sit to stand transfer   Sit to stand assist level: Touching or steadying assistance (Pt > 75%/lift 1 leg) Sit to stand assistive device: Bedrails  Chair/bed transfer   Chair/bed transfer method: Stand pivot Chair/bed transfer assist level: Touching or steadying assistance (Pt > 75%) Chair/bed transfer assistive device: Armrests;Walker   Chair/bed transfer details: Verbal cues for sequencing;Verbal cues for Hotel manager transfer assistive device: Grab bar;Walker       Assist level to toilet: Touching or steadying assistance (Pt > 75%) Assist level  from toilet: Touching or steadying assistance (Pt > 75%)  Tub/shower transfer             Cognition Comprehension Comprehension assist level: Understands basic 90% of the time/cues < 10% of the time  Expression Expression assist level: Expresses basic 90% of the time/requires cueing < 10% of the time.  Social Interaction Social Interaction assist level: Interacts appropriately 90% of the time - Needs monitoring or encouragement for participation or interaction.  Problem Solving Problem solving assist level: Solves basic 50 - 74% of the time/requires cueing 25 - 49% of the time  Memory Memory assist level: Recognizes or recalls 50 - 74% of the time/requires cueing 25 - 49% of the time    Therapy/Group: Individual Therapy  Rosalio Loud 04/02/2015, 10:02 AM

## 2015-04-03 ENCOUNTER — Inpatient Hospital Stay (HOSPITAL_COMMUNITY): Payer: Medicare Other | Admitting: Speech Pathology

## 2015-04-03 ENCOUNTER — Inpatient Hospital Stay (HOSPITAL_COMMUNITY): Payer: Medicare Other | Admitting: Occupational Therapy

## 2015-04-03 ENCOUNTER — Inpatient Hospital Stay (HOSPITAL_COMMUNITY): Payer: Medicare Other | Admitting: Physical Therapy

## 2015-04-03 ENCOUNTER — Inpatient Hospital Stay (HOSPITAL_COMMUNITY): Payer: PRIVATE HEALTH INSURANCE | Admitting: Speech Pathology

## 2015-04-03 MED ORDER — KETOTIFEN FUMARATE 0.025 % OP SOLN
1.0000 [drp] | Freq: Two times a day (BID) | OPHTHALMIC | Status: DC
  Administered 2015-04-03 – 2015-04-08 (×10): 1 [drp] via OPHTHALMIC
  Filled 2015-04-03: qty 5

## 2015-04-03 NOTE — Progress Notes (Signed)
Speech Language Pathology Daily Session Note  Patient Details  Name: Leah Figueroa MRN: [id number] Date of Birth: 10/23/1922  Today's Date: 04/03/2015 SLP Individual Time: 0930-1000 SLP Individual Time Calculation (min): 30 min  Short Term Goals: Week 1: SLP Short Term Goal 1 (Week 1): Patient will utilize call bell to request assistance in 75% of observable opportunities with supervision verbal cues.  SLP Short Term Goal 2 (Week 1): Patient will demonstrate functional problem solving for basic and familiar tasks with Min A multimodal cues.  SLP Short Term Goal 3 (Week 1): Patient will identify 2 cognitive and 2 physical deficits with Min A question cues.  SLP Short Term Goal 4 (Week 1): Patient will utilize external memory aids to recall new, daily information with Min A multimodal cues.  SLP Short Term Goal 5 (Week 1): Patient will demonstrate sustained attention to functional tasks for 60 minutes with Min A verbal cues for redirection.   Skilled Therapeutic Interventions: Skilled treatment session focused on cognitive goals. SLP facilitated session by providing extra time and Mod A verbal and question cues for functional problem solving, attention to LUE and emergent awareness of errors during a mildly complex organization task of transferring a list of written activities on a calender with focus on use of memory compensatory strategies. Patient sustained attention to task for 30 minutes with supervision verbal ceus for redirection. Patient left upright in wheelchair with quick release belt in place and all needs within reach. Continue with current plan of care.    Function:   Cognition Comprehension Comprehension assist level: Understands basic 90% of the time/cues < 10% of the time  Expression   Expression assist level: Expresses basic needs/ideas: With extra time/assistive device  Social Interaction Social Interaction assist level: Interacts appropriately with others with medication or  extra time (anti-anxiety, antidepressant).  Problem Solving Problem solving assist level: Solves basic 50 - 74% of the time/requires cueing 25 - 49% of the time  Memory Memory assist level: Recognizes or recalls 50 - 74% of the time/requires cueing 25 - 49% of the time    Pain Pain Assessment Pain Assessment: No/denies pain  Therapy/Group: Individual Therapy  Kiaan Overholser 04/03/2015, 10:37 AM

## 2015-04-03 NOTE — Progress Notes (Signed)
Subjective/Complaints: Pt without new issues overnite Disappointed about inability to go home independly Review of Systems - Negative except weakness in Left hand, neg  Left wrist pain , neg SOB, CP or abd pain Objective: Vital Signs: Blood pressure 146/47, pulse 66, temperature 98.3 F (36.8 C), temperature source Oral, resp. rate 16, height 5\' 1"  (1.549 m), weight 47.718 kg (105 lb 3.2 oz), SpO2 97 %. No results found. No results found for this or any previous visit (from the past 72 hour(s)).   HEENT: normal Cardio: RRR and no murmur Resp: CTA B/L and unlabored GI: BS positive and NT, ND Extremity:  Pulses positive and No Edema Skin:   Other erythema Left wrist Neuro: Alert/Oriented, Cranial Nerve II-XII normal, Abnormal Sensory reduced on Left side UE> LE, Abnormal Motor 2- LUE, 4- LLE, Abnormal FMC Ataxic/ dec FMC and Inattention Musc/Skel:  Mild erythema and swelling left wrist, no pain with ROM, mild tenderness to palpation , no other jts swollen in  in UE or LE Gen NAD   Assessment/Plan: 1. Functional deficits secondary to Right MCA infarct with left hemiparesis and L neglect which require 3+ hours per day of interdisciplinary therapy in a comprehensive inpatient rehab setting. Physiatrist is providing close team supervision and 24 hour management of active medical problems listed below. Physiatrist and rehab team continue to assess barriers to discharge/monitor patient progress toward functional and medical goals.   FIM: Function - Bathing Position: Wheelchair/chair at sink Body parts bathed by patient: Right arm, Left arm, Chest, Abdomen, Front perineal area, Buttocks Body parts bathed by helper: Back Assist Level: Touching or steadying assistance(Pt > 75%)  Function- Upper Body Dressing/Undressing What is the patient wearing?: Pull over shirt/dress Bra - Perfomed by patient: Thread/unthread left bra strap Bra - Perfomed by helper: Thread/unthread right bra strap,  Hook/unhook bra (pull down sports bra) Pull over shirt/dress - Perfomed by patient: Thread/unthread right sleeve, Put head through opening, Pull shirt over trunk Pull over shirt/dress - Perfomed by helper: Thread/unthread left sleeve Assist Level: More than reasonable time, Set up, Supervision or verbal cues, Touching or steadying assistance(Pt > 75%) (max cues for sequencing and problem solving) Set up : To obtain clothing/put away Function - Lower Body Dressing/Undressing Lower body dressing/undressing activity did not occur: 2: Max-Patient completed 25-49% of tasks What is the patient wearing?: Underwear, Pants, Shoes, Ted Hose Position: Wheelchair/chair at [employer] - Performed by patient: Thread/unthread left underwear leg, Thread/unthread right underwear leg Underwear - Performed by helper: Pull underwear up/down Pants- Performed by patient: Thread/unthread right pants leg, Thread/unthread left pants leg Pants- Performed by helper:  (pull up pants) Shoes - Performed by helper: Don/doff right shoe, Don/doff left shoe, Fasten right, Fasten left TED Hose - Performed by helper: Don/doff right TED hose, Don/doff left TED hose Assist Level: More than reasonable time, Set up, Supervision or verbal cues, Touching or steadying assistance (Pt > 75%) (Max verbal cues for sequencing and problem solving) Set up : To obtain clothing/put away  Function - Toileting Toileting steps completed by patient: Adjust clothing prior to toileting, Performs perineal hygiene Toileting steps completed by helper: Adjust clothing after toileting Toileting Assistive Devices: Grab bar or rail Assist level: Touching or steadying assistance (Pt.75%)  Function - Toilet Transfers Toilet transfer assistive device: Grab bar Assist level to toilet: Touching or steadying assistance (Pt > 75%) Assist level from toilet: Touching or steadying assistance (Pt > 75%)  Function - Chair/bed transfer Chair/bed transfer  method: Stand pivot Chair/bed  transfer assist level: Touching or steadying assistance (Pt > 75%) Chair/bed transfer assistive device: Armrests, Walker Chair/bed transfer details: Verbal cues for sequencing, Verbal cues for technique  Function - Locomotion: Ambulation Assistive device: Walker-rolling, Other (comment) Max distance: 200 Assist level: Supervision or verbal cues Assist level: Supervision or verbal cues Assist level: Supervision or verbal cues Assist level: Supervision or verbal cues  Function - Comprehension Comprehension: Auditory Comprehension assist level: Understands basic 90% of the time/cues < 10% of the time  Function - Expression Expression: Verbal Expression assist level: Expresses complex 90% of the time/cues < 10% of the time  Function - Social Interaction Social Interaction assist level: Interacts appropriately 90% of the time - Needs monitoring or encouragement for participation or interaction.  Function - Problem Solving Problem solving assist level: Solves basic 50 - 74% of the time/requires cueing 25 - 49% of the time  Function - Memory Memory assist level: Recognizes or recalls 50 - 74% of the time/requires cueing 25 - 49% of the time Patient normally able to recall (first 3 days only): Current season, Location of own room, Staff names and faces, That he or she is in a hospital Medical Problem List and Plan: 1. Functional deficits secondary to Right MCA infarct likely thrombotic 2. DVT Prophylaxis/Anticoagulation: Pharmaceutical: Lovenox, no overt signs of bleeding plt nl 3. Pain Management: tylenol prn for HA.no neck pain today.  4. Mood: LCSW to follow for evaluation and support.  5. Neuropsych: This patient is capable of making decisions on her own behalf. 6. Skin/Wound Care: Encourage adequate nutrition and hydration status. Routine pressure relief measures.  7. Fluids/Electrolytes/Nutrition: Monitor I/O. Normal  lytes despite Poor  intake 8. HTN: Monitor BP tid. Continue metoprolol and Norvasc. At goal this am 9. Hypothyroid: Continue supplement.  10.  Left wrist OA + pseudogout, not a good cand for oral NSAIDs due to recent CVA , serum urate -, now improved cont to monitor 11. hypoK likely nutritional , oral supplements, latest K is nl 12.  Anemia- Hgb stable, check stool guaic LOS (Days) 8 A FACE TO FACE EVALUATION WAS PERFORMED  KIRSTEINS,ANDREW E 04/03/2015, 8:22 AM

## 2015-04-03 NOTE — Progress Notes (Signed)
Occupational Therapy Session Note  Patient Details  Name: Leah Figueroa MRN: [id number] Date of Birth: 11/07/1922  Today's Date: 04/03/2015 OT Individual Time: 1030-1130 and 1300-1400 OT Individual Time Calculation (min): 60 min and 60 min   Short Term Goals: Week 1:  OT Short Term Goal 1 (Week 1): Pt will complete bathing with Min Assist for using AE, prn. OT Short Term Goal 2 (Week 1): Pt will dress upper body with Min Assist OT Short Term Goal 3 (Week 1): Pt will dress lower body with Mod assist OT Short Term Goal 4 (Week 1): Pt will groom sitting at sink with supervision OT Short Term Goal 5 (Week 1): Pt will demo ability to complete HEP for LUE [facility] with supervision  Skilled Therapeutic Interventions/Progress Updates:    1) Engaged in therapeutic activity with focus on activity tolerance, Lt attention, functional use of LUE, and adaptive technique to increase independence with LB dressing.  Upon arrival pt asking about planned cooking task, reporting already dressing during PT session.  Daughter present, educated daughter on Lt inattention and decreased problem solving with dressing tasks complicating independence with dressing.  Cooking task in standing at sink in ADL kitchen with mod verbal cues to attend to LUE as pt would rest Lt hand too close to burner with decreased awareness.  Multiple sit <> stand with supervision and ambulated along counter with supervision.  During seated rest breaks provided pt with elastic shoelaces to increase independence with donning and fastening shoes.  Pt continues to demonstrate difficulty with shoes with decreased problem solving with shoe tongue.  Educated on use of shoe horn with little to no improvement.  2) Engaged in therapeutic activity with focus on activity tolerance, Lt attention, and functional use of LUE.  Ambulated with RW with close supervision to ADL apt, pt demonstrating improved body placement within RW with mobility this session. Completed  cooking task, therapist again providing mod verbal cues to attend to LUE as hand would slide off of pot handle or she would place it too close to the burner with decreased awareness.  Pt accidentally dumping contents of bowl back into pot due to decreased attention to LUE.  Completed toilet transfers with close supervision and toileting with assist to fully pull up pants post toileting secondary to decreased coordination and attention to LUE.  Engaged in [facility] task in sitting with focus on visually attending to Lt hand, noted pt to drop multiple items but improvement with increased attention to hand and with repetition.  Therapy Documentation Precautions:  Precautions Precautions: Fall Restrictions Weight Bearing Restrictions: No Other Position/Activity Restrictions: monitor L wrist pain, did not weight bear during eval due to c/o pain General:   Vital Signs: Therapy Vitals Pulse Rate: 70 BP: (!) 140/50 mmHg Pain: Pain Assessment Pain Assessment: No/denies pain Pain Score: 0-No pain  Function:   Upper Body Dressing/Undressing Upper body dressing   What is the patient wearing?: Pull over shirt/dress     Pull over shirt/dress - Perfomed by patient: Thread/unthread left sleeve;Pull shirt over trunk Pull over shirt/dress - Perfomed by helper: Put head through opening;Thread/unthread right sleeve        Upper body assist Assist Level: More than reasonable time;Set up;Supervision or verbal cues;Touching or steadying assistance(Pt > 75%)   Set up : To obtain clothing/put away   Lower Body Dressing/Undressing Lower body dressing   What is the patient wearing?: Pants;Ted Hose;Shoes     Pants- Performed by patient: Thread/unthread right pants leg;Pull pants  up/down Pants- Performed by helper: Thread/unthread left pants leg         Shoes - Performed by patient: Don/doff right shoe;Don/doff left shoe;Fasten right;Fasten left Shoes - Performed by helper: Fasten right;Fasten left        TED Hose - Performed by helper: Don/doff right TED hose;Don/doff left TED hose  Lower body assist Assist Level: More than reasonable time;Set up;Supervision or verbal cues;Touching or steadying assistance (Pt > 75%)   Set up : To obtain clothing/put away   Toileting Toileting   Toileting steps completed by patient: Adjust clothing prior to toileting;Performs perineal hygiene Toileting steps completed by helper: Adjust clothing after toileting Toileting Assistive Devices: Grab bar or rail  Toileting assist Assist level: Touching or steadying assistance (Pt.75%);More than reasonable time    Bed Mobility Roll left and right activity   Assist level: Supervision or verbal cues  Sit to lying activity   Assist level: Supervision or verbal cues  Lying to sitting activity   Assist level: Supervision or verbal cues  Mobility details Bed mobility details: Visual cues/gestures for sequencing   Transfers Sit to stand transfer   Sit to stand assist level: Touching or steadying assistance (Pt > 75%/lift 1 leg) Sit to stand assistive device: Armrests  Chair/bed transfer   Chair/bed transfer method: Stand pivot Chair/bed transfer assist level: Touching or steadying assistance (Pt > 75%) Chair/bed transfer assistive device: Armrests;Walker   Chair/bed transfer details: Verbal cues for sequencing;Verbal cues for Hotel manager transfer assistive device: Grab bar;Walker       Assist level to toilet: Touching or steadying assistance (Pt > 75%) Assist level from toilet: Touching or steadying assistance (Pt > 75%)  Tub/shower transfer             Cognition Comprehension Comprehension assist level: Understands basic 90% of the time/cues < 10% of the time  Expression Expression assist level: Expresses basic needs/ideas: With extra time/assistive device  Social Interaction Social Interaction assist level: Interacts appropriately with others with medication or extra  time (anti-anxiety, antidepressant).  Problem Solving Problem solving assist level: Solves basic 50 - 74% of the time/requires cueing 25 - 49% of the time  Memory Memory assist level: Recognizes or recalls 50 - 74% of the time/requires cueing 25 - 49% of the time    Therapy/Group: Individual Therapy  Rosalio Loud 04/03/2015, 12:20 PM

## 2015-04-03 NOTE — Plan of Care (Signed)
Problem: RH PAIN MANAGEMENT Goal: RH STG PAIN MANAGED AT OR BELOW PT'S PAIN GOAL Pain level < 3  Outcome: Progressing No c/o pain     

## 2015-04-03 NOTE — Progress Notes (Signed)
Physical Therapy Session Note  Patient Details  Name: Leah Figueroa MRN: [id number] Date of Birth: 22-Aug-1922  Today's Date: 04/03/2015 PT Individual Time: 0830-0930 PT Individual Time Calculation (min): 60 min   Short Term Goals: Week 1:  PT Short Term Goal 1 (Week 1): Pt will perform bed mobility mod I with no bed rails PT Short Term Goal 2 (Week 1): Pt will perform supine <> sit supervision PT Short Term Goal 3 (Week 1): Pt will perform transfer w/c <> bed supervision PT Short Term Goal 4 (Week 1): Pt will perform gait x75' with minA and LRAD PT Short Term Goal 5 (Week 1): Pt will perfrom 12 3-inch stairs with minA  Skilled Therapeutic Interventions/Progress Updates:    Pt received seated in bed with no c/o pain and agreeable to treatment. Supine>sit on EOB with supervision. Sitting on EOB pt performed upper and lower body dressing with assist as described below. Requires cues for sequencing due to perseveration on task and L inattention. Bathroom transfer performed as described below with ambulation into bathroom using RW. Requires repetitive cues before and after using restroom to correct don/doff L hand splint and frequently begins sitting before removing L hand from splint with risk of injury. Gait to/from therapy gym for approximately 200' with supervision and RW and no hand splint due to pt difficulty with recall of correct use, sequencing, and safety concerns. Pt requires occasional cues to keep LUE on handle but overall improved safety without requiring recall of hand splint process to don/doff. Nustep performed with BLE and BUE x approx 3 min before pt noting increased discomfort in LUE. Remainder of time performed without use of LUE. Total 10 min AROM on level 3 to improve aerobic endurance for carryover into functional mobility tasks. Pt returned to room with ambulation using RW as described above. Remained seated in w/c with QR belt intact and all needs within reach at completion of  session.   Therapy Documentation Precautions:  Precautions Precautions: Fall Restrictions Weight Bearing Restrictions: No Other Position/Activity Restrictions: monitor L wrist pain, did not weight bear during eval due to c/o pain Pain: Pain Assessment Pain Assessment: No/denies pain Pain Score: 0-No pain    Function:  Government social research officer Devices: Grab bar or rail   Bed Mobility Roll left and right activity   Assist level: Supervision or verbal cues  Sit to lying activity   Assist level: Supervision or verbal cues  Lying to sitting activity   Assist level: Supervision or verbal cues  Mobility details Bed mobility details: Visual cues/gestures for sequencing   Transfers Sit to stand transfer   Sit to stand assist level: Touching or steadying assistance (Pt > 75%/lift 1 leg) Sit to stand assistive device: Armrests  Chair/bed transfer   Chair/bed transfer method: Stand pivot Chair/bed transfer assist level: Touching or steadying assistance (Pt > 75%) Chair/bed transfer assistive device: Armrests;Walker   Chair/bed transfer details: Verbal cues for sequencing;Verbal cues for Careers information officer transfer assistive device: Dance movement psychotherapist device: Walker-rolling Max distance: 250 Assist level: Supervision or verbal cues  Walk 10 feet activity   Assist level: Supervision or verbal cues  Walk 50 feet with 2 turns activity   Assist level: Supervision or verbal cues  Walk 150 feet activity   Assist level: Supervision or verbal cues  Walk 10 feet on  uneven surfaces activity Walk 10 feet on uneven surfaces activity did not occur: Safety/medical concerns    Stairs          Walk up/down 1 step activity        Walk up/down 4 steps activity      Walk up/down 12 steps activity      Pick up small objects from floor      Wheelchair          Wheel 50 feet with 2  turns activity      Wheel 150 feet activity       Cognition Comprehension Comprehension assist level: Understands basic 90% of the time/cues < 10% of the time  Expression Expression assist level: Expresses basic needs/ideas: With extra time/assistive device  Social Interaction Social Interaction assist level: Interacts appropriately with others with medication or extra time (anti-anxiety, antidepressant).  Problem Solving Problem solving assist level: Solves basic 50 - 74% of the time/requires cueing 25 - 49% of the time  Memory Memory assist level: Recognizes or recalls 50 - 74% of the time/requires cueing 25 - 49% of the time    Therapy/Group: Individual Therapy  [name] 04/03/2015, 12:11 PM

## 2015-04-04 ENCOUNTER — Inpatient Hospital Stay (HOSPITAL_COMMUNITY): Payer: Medicare Other | Admitting: Physical Therapy

## 2015-04-04 ENCOUNTER — Inpatient Hospital Stay (HOSPITAL_COMMUNITY): Payer: Medicare Other | Admitting: Speech Pathology

## 2015-04-04 ENCOUNTER — Inpatient Hospital Stay (HOSPITAL_COMMUNITY): Payer: Medicare Other | Admitting: Occupational Therapy

## 2015-04-04 DIAGNOSIS — R208 Other disturbances of skin sensation: Secondary | ICD-10-CM

## 2015-04-04 DIAGNOSIS — I69898 Other sequelae of other cerebrovascular disease: Secondary | ICD-10-CM

## 2015-04-04 NOTE — Plan of Care (Signed)
Problem: RH Laundry Goal: LTG Patient will perform laundry w/assist, cues (OT) LTG: Patient will perform laundry with assistance, with/without cues (OT).  Outcome: Not Applicable Date Met:  64/84/72 D/C due to change in plan to SNF and no longer a focus of treatment sessions  Problem: RH Light Housekeeping Goal: LTG Patient will perform light housekeeping w/assist (OT) LTG: Patient will perform light housekeeping with assistance, with/without cues (OT).  Outcome: Not Applicable Date Met:  03/05/81 D/C due to change in plan to SNF and no longer a focus of treatment sessions

## 2015-04-04 NOTE — Progress Notes (Signed)
Occupational Therapy Weekly Progress Note  Patient Details  Name: Leah Figueroa MRN: [id number] Date of Birth: June 12, 1923  Beginning of progress report period: March 27, 2015 End of progress report period: April 04, 2015  Today's Date: 04/04/2015 OT Individual Time: 0900-1030 OT Individual Time Calculation (min): 90 min    Patient has met 4 of 5 short term goals.  Pt is making steady progress towards goals.  She requires increased time and mod-max verbal cues for sequencing and problem solving with [place]-care tasks of bathing and dressing due to decreased attention, proprioception, and sensation in LUE.  Pt is demonstrating improved coordination and motor control in LUE with table top tasks when visually attending and focusing solely on LUE task.  Due to pt's cognitive deficits and need for constant close supervision, plan has changed to SNF as family is unable to provide 24 hr supervision and cues.  Patient continues to demonstrate the following deficits: Lt inattention, impaired proprioception and sensation in LUE, decreased balance reactions, decreased awareness of deficits, and impaired memory and therefore will continue to benefit from skilled OT intervention to enhance overall performance with BADL and Reduce care partner burden.  Patient progressing toward long term goals..  Plan of care revisions: d/c home making tasks secondary to change in d/c plan.  OT Short Term Goals Week 1:  OT Short Term Goal 1 (Week 1): Pt will complete bathing with Min Assist for using AE, prn. OT Short Term Goal 1 - Progress (Week 1): Met OT Short Term Goal 2 (Week 1): Pt will dress upper body with Min Assist OT Short Term Goal 2 - Progress (Week 1): Met OT Short Term Goal 3 (Week 1): Pt will dress lower body with Mod assist OT Short Term Goal 3 - Progress (Week 1): Met OT Short Term Goal 4 (Week 1): Pt will groom sitting at sink with supervision OT Short Term Goal 4 - Progress (Week 1): Met OT Short Term  Goal 5 (Week 1): Pt will demo ability to complete HEP for LUE [facility] with supervision OT Short Term Goal 5 - Progress (Week 1): Progressing toward goal Week 2:  OT Short Term Goal 1 (Week 2): STG = LTGs due to remaining LOS  Skilled Therapeutic Interventions/Progress Updates:    ADL retraining with focus on Lt attention, awareness of deficits, and re-education on dressing tasks.  Pt required mod-max verbal cues for sequencing and problem solving with dressing tasks due to impaired proprioception and problem solving with dressing.  Noted increased sequencing with bathing this session in functional setting of room shower.  Verbal cues and assist for orientation and setup for clothing with pt able to successfully don shirt with supervision on 3rd attempt.  Grooming tasks completed in standing with supervision and cues to scan to Lt to obtain items.  Engaged in therapeutic activity with LUE with focus on scanning to Lt to obtain items and fine and gross motor control with table top task with improved coordination this session.  Therapy Documentation Precautions:  Precautions Precautions: Fall Restrictions Weight Bearing Restrictions: No Other Position/Activity Restrictions: monitor L wrist pain, did not weight bear during eval due to c/o pain Pain:  Pt with no c/o pain  Function:    Grooming Oral Care,Brush Teeth, Clean Dentures Activity:      Assist Level: Supervision or verbal cues      Wash, Rinse, Dry Face Activity   Assist Level: Supervision or verbal cues      Wash, Rinse, Dry Hands  Activity   Assist Level: Supervision or verbal cues      Brush, Comb Hair Activity   Assist Level: Helper performed activity    Shave Activity          Apply Makeup Activity                                                             Bathing Bathing position   Position: Shower  Bathing parts Body parts bathed by patient: Right arm;Left arm;Chest;Abdomen;Front perineal area;Buttocks;Right  upper leg;Left upper leg    Bathing assist Assist Level: Touching or steadying assistance(Pt > 75%)       Upper Body Dressing/Undressing Upper body dressing   What is the patient wearing?: Pull over shirt/dress     Pull over shirt/dress - Perfomed by patient: Thread/unthread right sleeve;Thread/unthread left sleeve;Put head through opening;Pull shirt over trunk          Upper body assist Assist Level: More than reasonable time;Supervision or verbal cues (max verbal cues for sequencing and problem solving)   Set up : To obtain clothing/put away   Lower Body Dressing/Undressing Lower body dressing   What is the patient wearing?: Pants;Ted Hose;Shoes;Underwear Underwear - Performed by patient: Thread/unthread right underwear leg;Thread/unthread left underwear leg;Pull underwear up/down   Pants- Performed by patient: Thread/unthread right pants leg;Thread/unthread left pants leg;Pull pants up/down           Shoes - Performed by patient: Don/doff right shoe Shoes - Performed by helper: Don/doff left shoe       TED Hose - Performed by helper: Don/doff right TED hose;Don/doff left TED hose  Lower body assist Assist Level: More than reasonable time;Set up;Supervision or verbal cues;Touching or steadying assistance (Pt > 75%) (max verbal cues for sequencing and problem solving)   Set up : To obtain clothing/put away;Don/doff TED stockings   Transfers Sit to stand transfer        Chair/bed transfer              Toilet transfer                Tub/shower transfer   Tub/shower assistive device: Shower chair;Grab bars;Walk in shower   Assist level into tub: Touching or steadying assistance (Pt > 75%/lift 1 leg) Assist level out of tub: Touching or steadying assistance (Pt > 75%)   Cognition Comprehension Comprehension assist level: Follows basic conversation/direction with extra time/assistive device  Expression Expression assist level: Expresses basic needs/ideas:  With extra time/assistive device  Social Interaction Social Interaction assist level: Interacts appropriately 90% of the time - Needs monitoring or encouragement for participation or interaction.  Problem Solving Problem solving assist level: Solves basic 25 - 49% of the time - needs direction more than half the time to initiate, plan or complete simple activities  Memory Memory assist level: Recognizes or recalls 75 - 89% of the time/requires cueing 10 - 24% of the time    Therapy/Group: Individual Therapy  Simonne Come 04/04/2015, 10:27 AM

## 2015-04-04 NOTE — Plan of Care (Signed)
Problem: RH Memory Goal: LTG Patient will use memory compensatory aids to (SLP) LTG: Patient will use memory compensatory aids to recall biographical/new, daily complex information with cues (SLP)  Downgraded due to slow progress and change in discharge plan to SNF   Problem: RH Attention Goal: LTG Patient will demonstrate focused/sustained (SLP) LTG: Patient will demonstrate focused/sustained/selective/alternating/divided attention during cognitive/linguistic activities in specific environment with assist for # of minutes (SLP)  Downgraded due to slow progress and change in discharge plan to SNF   Problem: RH Awareness Goal: LTG: Patient will demonstrate intellectual/emergent (SLP) LTG: Patient will demonstrate intellectual/emergent/anticipatory awareness with assist during a cognitive/linguistic activity (SLP)  Downgraded due to slow progress and change in discharge plan to SNF

## 2015-04-04 NOTE — Progress Notes (Signed)
Physical Therapy Weekly Progress Note  Patient Details  Name: Leah Figueroa MRN: [id number] Date of Birth: [birth year]  Beginning of progress report period: March 27, 2015 End of progress report period: April 04, 2015  Today's Date: 04/04/2015 PT Individual Time: 1400-1457 PT Individual Time Calculation (min): 57 min   Patient has met 5 of 5 short term goals.  Pt is supervision overall with occasional minA required due to loss of balance. Requires repetitive cues for safety awareness, L attention, and sustained attention to task. Pt has difficulty with recall of safety with AD, transfers, balance between sessions.   Patient continues to demonstrate the following deficits: impaired balance, decreased strength, poor postural control, and therefore will continue to benefit from skilled PT intervention to enhance overall performance with activity tolerance, balance, postural control, ability to compensate for deficits, functional use of  left upper extremity and left lower extremity, attention, awareness and coordination.  Patient progressing toward long term goals..  Continue plan of care.  PT Short Term Goals Week 1:  PT Short Term Goal 1 (Week 1): Pt will perform bed mobility mod I with no bed rails PT Short Term Goal 1 - Progress (Week 1): Met PT Short Term Goal 2 (Week 1): Pt will perform supine <> sit supervision PT Short Term Goal 2 - Progress (Week 1): Met PT Short Term Goal 3 (Week 1): Pt will perform transfer w/c <> bed supervision PT Short Term Goal 3 - Progress (Week 1): Met PT Short Term Goal 4 (Week 1): Pt will perform gait x75' with minA and LRAD PT Short Term Goal 4 - Progress (Week 1): Met PT Short Term Goal 5 (Week 1): Pt will perfrom 12 3-inch stairs with minA PT Short Term Goal 5 - Progress (Week 1): Met Week 2:  PT Short Term Goal 1 (Week 2): =LTG due to ELOS  Skilled Therapeutic Interventions/Progress Updates:    Pt received in w/c with friend present; no c/o pain and  agreeable to treatment. Pt requests to use the bathroom and impulsively attempts to get up from unlocked w/c with L shoe not intact, begins sliding out of chair and requires maxA to remain up. Pt assisted back to sitting in chair, discussed importance of locking w/c, waiting for assistance and using RW for safety. Pt assisted to bathroom with RW and CGA. Pants donned/doffed and personal hygiene all with steadying assist and improving LUE coordination with pants management. Pt ambulated to gym with RW and supervision x100'. Dynamic gait training in gym with pt locating beanbags throughout gym, focusing on balance without use of AD, visual scanning, L attention, sustained attention to task. Pt requires occasional modA to maintain balance, as well as several verbal cues for visual scanning to locate objects. Bed transfer performed in simulated apartment with minA. Gait training in hallway using RW x300' and supervision. Occasional cues required for L hand placement on RW due to inattention. Pt returned to room and remained seated in w/c with QR belt intact and all needs within reach.   Therapy Documentation Precautions:  Precautions Precautions: Fall Restrictions Weight Bearing Restrictions: No Other Position/Activity Restrictions: monitor L wrist pain, did not weight bear during eval due to c/o pain Pain: Pain Assessment Pain Assessment: No/denies pain Pain Score: 0-No pain   Function:  Government social research officer Devices: Grab bar or rail   Bed Mobility Roll left and right activity   Assist level: Supervision or verbal cues  Sit to lying activity  Assist level: Supervision or verbal cues  Lying to sitting activity   Assist level: Supervision or verbal cues  Mobility details Bed mobility details: Visual cues/gestures for sequencing   Transfers Sit to stand transfer   Sit to stand assist level: Touching or steadying assistance (Pt > 75%/lift 1 leg)    Chair/bed transfer    Chair/bed transfer method: Stand pivot Chair/bed transfer assist level: Touching or steadying assistance (Pt > 75%) Chair/bed transfer assistive device: Armrests;Walker   Chair/bed transfer details: Verbal cues for sequencing;Verbal cues for Animal nutritionist transfer assistive device: Mining engineer     Max distance: 300 Assist level: Supervision or verbal cues  Walk 10 feet activity   Assist level: Supervision or verbal cues  Walk 50 feet with 2 turns activity   Assist level: Supervision or verbal cues  Walk 150 feet activity   Assist level: Supervision or verbal cues  Walk 10 feet on uneven surfaces activity      Stairs   Stairs assistive device: 2 hand rails Max number of stairs: 12 Stairs assist level: Touching or steadying assistance (Pt > 75%)  Walk up/down 1 step activity     Walk up/down 1 step (curb) assist level: Touching or steadying assistance (Pt > 75%)  Walk up/down 4 steps activity   Walk up/down 4 steps assist level: Touching or steadying assistance (Pt > 75%)  Walk up/down 12 steps activity   Walk up/down 12 steps assist level: Touching or steadying assistance (Pt > 75%)  Pick up small objects from floor   Assist level: Touching or steadying assistance (Pt > 75%)  Wheelchair          Wheel 50 feet with 2 turns activity      Wheel 150 feet activity       Cognition Comprehension    Expression Expression assist level: Expresses basic needs/ideas: With extra time/assistive device  Social Interaction Social Interaction assist level: Interacts appropriately 90% of the time - Needs monitoring or encouragement for participation or interaction.  Problem Solving Problem solving assist level: Solves basic 50 - 74% of the time/requires cueing 25 - 49% of the time  Memory Memory assist level: Recognizes or recalls 50 - 74% of the time/requires cueing 25 - 49% of the time    Therapy/Group:  Individual Therapy  Luberta Mutter 04/04/2015, 2:54 PM

## 2015-04-04 NOTE — Progress Notes (Signed)
Speech Language Pathology Weekly Progress and Session Note  Patient Details  Name: Leah Figueroa MRN: [id number] Date of Birth: Sep 12, 1922  Beginning of progress report period: March 28, 2015 End of progress report period: April 04, 2015  Today's Date: 04/04/2015 SLP Individual Time: 1030-1100; 1500-1530 SLP Individual Time Calculation (min): 30 min  Short Term Goals: Week 1: SLP Short Term Goal 1 (Week 1): Patient will utilize call bell to request assistance in 75% of observable opportunities with supervision verbal cues.  SLP Short Term Goal 1 - Progress (Week 1): Met SLP Short Term Goal 2 (Week 1): Patient will demonstrate functional problem solving for basic and familiar tasks with Min A multimodal cues.  SLP Short Term Goal 2 - Progress (Week 1): Progressing toward goal SLP Short Term Goal 3 (Week 1): Patient will identify 2 cognitive and 2 physical deficits with Min A question cues.  SLP Short Term Goal 3 - Progress (Week 1): Progressing toward goal SLP Short Term Goal 4 (Week 1): Patient will utilize external memory aids to recall new, daily information with Min A multimodal cues.  SLP Short Term Goal 4 - Progress (Week 1): Progressing toward goal SLP Short Term Goal 5 (Week 1): Patient will demonstrate sustained attention to functional tasks for 60 minutes with Min A verbal cues for redirection.  SLP Short Term Goal 5 - Progress (Week 1): Met    New Short Term Goals: Week 2: SLP Short Term Goal 1 (Week 2): LTG=STG due to remaining length of stay   Weekly Progress Updates:  Pt made slow functional gains while inpatient and has met 2 out of 6 short term goals.  Pt is currently mod assist for basic, familiar tasks due to left inattention, decreased functional problem solving, decreased recall of new information, and decreased intellectual/emergent awareness of deficits.  Pt is now pending SNF placement as pt's daughter is unable to provide 24/7 supervision.  Pt would continue to  benefit from skilled ST while inpatient in order to maximize functional independence and reduce burden of care prior to discharge.     Intensity: Minumum of 1-2 x/day, 30 to 90 minutes Frequency: 3 to 5 out of 7 days Duration/Length of Stay: 14-18 days  Treatment/Interventions: Cognitive remediation/compensation;Cueing hierarchy;Internal/external aids;Environmental controls;Functional tasks;Patient/family education;Therapeutic Activities   Daily Session  Skilled Therapeutic Interventions:  Session 1: Pt was seen for skilled ST targeting cognitive goals.  Upon arrival, pt was seated upright in wheelchair, awake, alert, and agreeable to participate in ST.  SLP facilitated the session with a previously taught card game targeting delayed recall of new information.  Pt recalled rules of the game with min-mod assist question cues and benefited from min assist verbal cues for thought organization and working during activity.  Pt was returned to room and left upright in wheelchair with friend present and call bell within reach.  Continue per current plan of care.   Session 2: Pt was seen for skilled ST targeting cognitive goals.  Upon arrival, pt was seated upright in wheelchair, awake, alert, and agreeable to participate in [place].  SLP facilitated the session with a basic board game targeting functional problem solving and visual scanning to the left.  Pt had great difficulty problem solving during the abovementioned task due to her intense focus on use of left upper extremity; therefore, further completion of task was deferred.  SLP then facilitated the session with a pt selected reading activity targeting working memory and visual scanning.  Pt read a passage from  her bible study text with min assist verbal and visual cues to redirect her visual attention as she was noted to skip words and repeat lines intermittently.  Pt demonstrated good comprehension of text despite errors and was able to summarize the  passage with supervision question cues.  Pt was left upright in wheelchair with quick release belt donned and call bell within reach.  Continue per current plan of care.     Function:   Eating Eating       Cognition Comprehension Comprehension assist level: Follows basic conversation/direction with extra time/assistive device  Expression   Expression assist level: Expresses basic needs/ideas: With extra time/assistive device  Social Interaction Social Interaction assist level: Interacts appropriately 90% of the time - Needs monitoring or encouragement for participation or interaction.  Problem Solving Problem solving assist level: Solves basic 50 - 74% of the time/requires cueing 25 - 49% of the time  Memory Memory assist level: Recognizes or recalls 50 - 74% of the time/requires cueing 25 - 49% of the time   General    Pain Pain Assessment (1)   Pain Assessment: No/denies pain Pain Assessment (2) Pain Assessment: No/denies pain Therapy/Group: Individual Therapy  Estefani Bateson, Selinda Orion 04/04/2015, 12:57 PM

## 2015-04-04 NOTE — Progress Notes (Signed)
Subjective/Complaints: Review of Systems - Negative except weakness in Left hand, neg  Left wrist pain , neg SOB, CP or abd pain Objective: Vital Signs: Blood pressure 139/49, pulse 58, temperature 98.6 F (37 C), temperature source Oral, resp. rate 18, height  (1.549 m), weight 47.718 kg (105 lb 3.2 oz), SpO2 100 %. No results found. No results found for this or any previous visit (from the past 72 hour(s)).   HEENT: normal Cardio: RRR and no murmur Resp: CTA B/L and unlabored GI: BS positive and NT, ND Extremity:  Pulses positive and No Edema Skin:   Other erythema Left wrist Neuro: Alert/Oriented, Cranial Nerve II-XII normal, Abnormal Sensory absent LT on Left side UE> LE, Abnormal Motor 3- LUE, 4- LLE, Abnormal FMC Ataxic/ dec FMC and Inattention Musc/Skel: no erythema and swelling left wrist, no pain with ROM, mild tenderness to palpation , no other jts swollen in  in UE or LE Gen NAD   Assessment/Plan: 1. Functional deficits secondary to Right MCA infarct with left hemiparesis and L neglect which require 3+ hours per day of interdisciplinary therapy in a comprehensive inpatient rehab setting. Physiatrist is providing close team supervision and 24 hour management of active medical problems listed below. Physiatrist and rehab team continue to assess barriers to discharge/monitor patient progress toward functional and medical goals.   FIM: Function - Bathing Position: Wheelchair/chair at sink Body parts bathed by patient: Right arm, Left arm, Chest, Abdomen, Front perineal area, Buttocks Body parts bathed by helper: Back Assist Level: Touching or steadying assistance(Pt > 75%)  Function- Upper Body Dressing/Undressing What is the patient wearing?: Pull over shirt/dress Bra - Perfomed by patient: Thread/unthread left bra strap Bra - Perfomed by helper: Thread/unthread right bra strap, Hook/unhook bra (pull down sports bra) Pull over shirt/dress - Perfomed by patient:  Thread/unthread left sleeve, Pull shirt over trunk Pull over shirt/dress - Perfomed by helper: Put head through opening, Thread/unthread right sleeve Assist Level: More than reasonable time, Set up, Supervision or verbal cues, Touching or steadying assistance(Pt > 75%) Set up : To obtain clothing/put away Function - Lower Body Dressing/Undressing Lower body dressing/undressing activity did not occur: 2: Max-Patient completed 25-49% of tasks What is the patient wearing?: Pants, Ted Hose, Shoes Position: Sitting EOB Underwear - Performed by patient: Thread/unthread left underwear leg, Thread/unthread right underwear leg Underwear - Performed by helper: Pull underwear up/down Pants- Performed by patient: Thread/unthread right pants leg, Pull pants up/down Pants- Performed by helper: Thread/unthread left pants leg Shoes - Performed by patient: Don/doff right shoe, Don/doff left shoe, Fasten right, Fasten left Shoes - Performed by helper: Fasten right, Fasten left TED Hose - Performed by helper: Don/doff right TED hose, Don/doff left TED hose Assist Level: More than reasonable time, Set up, Supervision or verbal cues, Touching or steadying assistance (Pt > 75%) Set up : To obtain clothing/put away  Function - Toileting Toileting steps completed by patient: Adjust clothing prior to toileting, Performs perineal hygiene Toileting steps completed by helper: Adjust clothing after toileting Toileting Assistive Devices: Grab bar or rail Assist level: More than reasonable time  Function - Archivist transfer assistive device: Grab bar Assist level to toilet: Touching or steadying assistance (Pt > 75%) Assist level from toilet: Touching or steadying assistance (Pt > 75%)  Function - Chair/bed transfer Chair/bed transfer method: Stand pivot Chair/bed transfer assist level: Touching or steadying assistance (Pt > 75%) Chair/bed transfer assistive device: Armrests, Walker Chair/bed  transfer details: Verbal cues for  sequencing, Verbal cues for technique  Function - Locomotion: Ambulation Assistive device: Walker-rolling Max distance: 250 Assist level: Supervision or verbal cues Assist level: Supervision or verbal cues Assist level: Supervision or verbal cues Assist level: Supervision or verbal cues Walk 10 feet on uneven surfaces activity did not occur: Safety/medical concerns  Function - Comprehension Comprehension: Auditory Comprehension assistive device: Other (Pt uses hearing aids, but didnot bring to hospital) Comprehension assist level: Follows basic conversation/direction with extra time/assistive device  Function - Expression Expression: Verbal Expression assist level: Expresses basic needs/ideas: With extra time/assistive device  Function - Social Interaction Social Interaction assist level: Interacts appropriately 75 - 89% of the time - Needs redirection for appropriate language or to initiate interaction.  Function - Problem Solving Problem solving assist level: Solves basic 25 - 49% of the time - needs direction more than half the time to initiate, plan or complete simple activities  Function - Memory Memory assist level: Recognizes or recalls 75 - 89% of the time/requires cueing 10 - 24% of the time Patient normally able to recall (first 3 days only): Current season, That he or she is in a hospital Medical Problem List and Plan: 1. Functional deficits secondary to Right MCA infarct likely thrombotic, Left hemisensory deficits causing decreased use despite motor gains 2. DVT Prophylaxis/Anticoagulation: Pharmaceutical: Lovenox, no overt signs of bleeding plt nl 3. Pain Management: tylenol prn for HA.no c/os today 4. Mood: LCSW to follow for evaluation and support.  5. Neuropsych: This patient is capable of making decisions on her own behalf. 6. Skin/Wound Care: Encourage adequate nutrition and hydration status. Routine pressure relief  measures.  7. Fluids/Electrolytes/Nutrition: Monitor I/O. Normal  lytes despite Poor intake 8. HTN: Monitor BP tid. Continue metoprolol and Norvasc. BPs in good range 9. Hypothyroid: Continue supplement.  10.  Left wrist OA + pseudogout,  now improved cont to monitor  11.  Anemia- Hgb stable, ordered stool guaic but no BM x 2d 12 Constipation - adjust laxatives LOS (Days) 9 A FACE TO FACE EVALUATION WAS PERFORMED  KIRSTEINS,ANDREW E 04/04/2015, 6:56 AM

## 2015-04-05 ENCOUNTER — Inpatient Hospital Stay (HOSPITAL_COMMUNITY): Payer: PRIVATE HEALTH INSURANCE | Admitting: Physical Therapy

## 2015-04-05 DIAGNOSIS — R414 Neurologic neglect syndrome: Secondary | ICD-10-CM

## 2015-04-05 NOTE — Progress Notes (Signed)
Subjective/Complaints:  Got chilly after PT, ambulated with PT and walker needed cues to look left and find her room Review of Systems - Negative except weakness in Left hand, neg  Left wrist pain , neg SOB, CP or abd pain Objective: Vital Signs: Blood pressure 166/61, pulse 56, temperature 98.1 F (36.7 C), temperature source Oral, resp. rate 17, height 5\' 1"  (1.549 m), weight 47.718 kg (105 lb 3.2 oz), SpO2 97 %. No results found. No results found for this or any previous visit (from the past 72 hour(s)).   HEENT: normal Cardio: RRR and no murmur Resp: CTA B/L and unlabored GI: BS positive and NT, ND Extremity:  Pulses positive and No Edema Skin:   Other erythema Left wrist Neuro: Alert/Oriented, Cranial Nerve II-XII normal, Abnormal Sensory absent LT on Left side UE> LE, Abnormal Motor 3- LUE, 4- LLE, Abnormal FMC Ataxic/ dec FMC and Inattention Musc/Skel: no erythema and swelling left wrist, no pain with ROM, mild tenderness to palpation , no other jts swollen in  in UE or LE Gen NAD   Assessment/Plan: 1. Functional deficits secondary to Right MCA infarct with left hemiparesis and L neglect which require 3+ hours per day of interdisciplinary therapy in a comprehensive inpatient rehab setting. Physiatrist is providing close team supervision and 24 hour management of active medical problems listed below. Physiatrist and rehab team continue to assess barriers to discharge/monitor patient progress toward functional and medical goals.   FIM: Function - Bathing Position: Shower Body parts bathed by patient: Right arm, Left arm, Chest, Abdomen, Front perineal area, Buttocks, Right upper leg, Left upper leg Body parts bathed by helper: Back Assist Level: Touching or steadying assistance(Pt > 75%)  Function- Upper Body Dressing/Undressing What is the patient wearing?: Pull over shirt/dress Bra - Perfomed by patient: Thread/unthread left bra strap Bra - Perfomed by helper:  Thread/unthread right bra strap, Hook/unhook bra (pull down sports bra) Pull over shirt/dress - Perfomed by patient: Thread/unthread right sleeve, Thread/unthread left sleeve, Put head through opening, Pull shirt over trunk Pull over shirt/dress - Perfomed by helper: Put head through opening, Thread/unthread right sleeve Assist Level: More than reasonable time, Supervision or verbal cues (max verbal cues for sequencing and problem solving) Set up : To obtain clothing/put away Function - Lower Body Dressing/Undressing Lower body dressing/undressing activity did not occur: 2: Max-Patient completed 25-49% of tasks What is the patient wearing?: Pants, [employer], Shoes, Underwear Position: Research officer, trade union at [employer] - Performed by patient: Thread/unthread right underwear leg, Thread/unthread left underwear leg, Pull underwear up/down Underwear - Performed by helper: Pull underwear up/down Pants- Performed by patient: Thread/unthread right pants leg, Thread/unthread left pants leg, Pull pants up/down Pants- Performed by helper: Thread/unthread left pants leg Shoes - Performed by patient: Don/doff right shoe Shoes - Performed by helper: Don/doff left shoe TED Hose - Performed by helper: Don/doff right TED hose, Don/doff left TED hose Assist Level: More than reasonable time, Set up, Supervision or verbal cues, Touching or steadying assistance (Pt > 75%) (max verbal cues for sequencing and problem solving) Set up : To obtain clothing/put away, Don/doff TED stockings  Function - Toileting Toileting steps completed by patient: Adjust clothing prior to toileting, Performs perineal hygiene, Adjust clothing after toileting Toileting steps completed by helper: Adjust clothing after toileting Toileting Assistive Devices: Grab bar or rail Assist level: More than reasonable time, Touching or steadying assistance (Pt.75%)  Function - Toilet Transfers Toilet transfer assistive device: Grab bar Assist  level to toilet:  Touching or steadying assistance (Pt > 75%) Assist level from toilet: Touching or steadying assistance (Pt > 75%)  Function - Chair/bed transfer Chair/bed transfer method: Stand pivot Chair/bed transfer assist level: Touching or steadying assistance (Pt > 75%) Chair/bed transfer assistive device: Armrests, Walker Chair/bed transfer details: Verbal cues for sequencing, Verbal cues for technique  Function - Locomotion: Ambulation Assistive device: Walker-rolling Max distance: 300 Assist level: Supervision or verbal cues Assist level: Supervision or verbal cues Assist level: Supervision or verbal cues Assist level: Supervision or verbal cues Walk 10 feet on uneven surfaces activity did not occur: Safety/medical concerns  Function - Comprehension Comprehension: Auditory Comprehension assistive device: Other (Pt uses hearing aids, but didnot bring to hospital) Comprehension assist level: Follows basic conversation/direction with extra time/assistive device  Function - Expression Expression: Verbal Expression assist level: Expresses basic needs/ideas: With extra time/assistive device  Function - Social Interaction Social Interaction assist level: Interacts appropriately 90% of the time - Needs monitoring or encouragement for participation or interaction.  Function - Problem Solving Problem solving assist level: Solves basic 50 - 74% of the time/requires cueing 25 - 49% of the time  Function - Memory Memory assist level: Recognizes or recalls 50 - 74% of the time/requires cueing 25 - 49% of the time Patient normally able to recall (first 3 days only): That he or she is in a hospital, Current season Medical Problem List and Plan: 1. Functional deficits secondary to Right MCA infarct likely thrombotic, Left hemisensory deficits causing decreased use despite motor gains 2. DVT Prophylaxis/Anticoagulation: Pharmaceutical: Lovenox, no overt signs of bleeding plt nl 3.  Pain Management: tylenol prn for HA.no c/os today 4. Mood: LCSW to follow for evaluation and support.  5. Neuropsych: This patient is capable of making decisions on her own behalf. 6. Skin/Wound Care: Encourage adequate nutrition and hydration status. Routine pressure relief measures.  7. Fluids/Electrolytes/Nutrition: Monitor I/O. Normal  lytes despite fair intake, 50-100%meals, ~900 ml fluids yest 8. HTN: Monitor BP tid. Continue metoprolol and Norvasc. BPs elevated this am but has been controlled 9. Hypothyroid: Continue supplement.  10.  Left wrist OA + pseudogout,  Acute episode resolved  11.  Anemia- Hgb stable, ordered stool guaic but no BM x 3d 12 Constipation - adjust laxatives, dulc today LOS (Days) 10 A FACE TO FACE EVALUATION WAS PERFORMED  KIRSTEINS,ANDREW E 04/05/2015, 9:34 AM

## 2015-04-05 NOTE — Progress Notes (Signed)
Physical Therapy Session Note  Patient Details  Name: Leah Figueroa MRN: [id number] Date of Birth: 1922-08-22  Today's Date: 04/05/2015 PT Individual Time: 0800-0930 PT Individual Time Calculation (min): 90 min   Short Term Goals: Week 2:  PT Short Term Goal 1 (Week 2): =LTG due to ELOS  Skilled Therapeutic Interventions/Progress Updates:    Pt received supine in bed; agreeable to treatment and no c/o pain. Pt requests to use restroom; ambulated into/out of bathroom with HHA x15'. Steadying assist provided while donning/doffing pants, however no assist needed for pants management. Personal hygiene performed with supervision. Seated on EOB pt performed upper and lower body dressing as described below. Improving awareness of clothing management, however still requires min cues for set-up and with fine motor coordination with buttons and minA shoes. Ambulation to gym with RW and supervision, min cues for L hand placement on RW. Standing balance activity performed standing on balance foam with dynamic UE reaching with card matching task. Activitiy required sensory organization on compliant surface, LUE coordination and reaching, visual scanning and L attention. Requires two seated rest breaks and increased cues for stepping backwards off balance foam. Standing dynamic stepping activitiy playing horseshoes with alternating LE stepping and alternating UE throwing to improve stepping strategies, as well as UE coordination, and activity tolerance. Requires occasional minA to maintain balance, mostly when stepping backward to starting position. Stepping activity performed with pt alternating LE stepping to numbered targets on floor. Requires modA and use of mat table on legs as welll as UEs reaching for support when stepping backwards due to posterior bias. Somewhat improved over time with practice however inconsistent and continues to require assistance to maintain balance. Pt ambulated back to room with RW and  supervision x150', min cues for hand placement. Pt sat on EOB, sit>supine with supervision. Pt remained supine in bed with alarm intact and all needs within reach.   Therapy Documentation Precautions:  Precautions Precautions: Fall Restrictions Weight Bearing Restrictions: No Other Position/Activity Restrictions: monitor L wrist pain, did not weight bear during eval due to c/o pain Pain:  Pt reports no pain   Function:  Government social research officer Devices: Grab bar or rail   Bed Mobility Roll left and right activity   Assist level: Supervision or verbal cues  Sit to lying activity   Assist level: Supervision or verbal cues  Lying to sitting activity   Assist level: Supervision or verbal cues  Mobility details Bed mobility details: Visual cues/gestures for sequencing   Transfers Sit to stand transfer   Sit to stand assist level: Touching or steadying assistance (Pt > 75%/lift 1 leg) Sit to stand assistive device: Bedrails  Chair/bed transfer   Chair/bed transfer method: Stand pivot Chair/bed transfer assist level: Touching or steadying assistance (Pt > 75%) Chair/bed transfer assistive device: Armrests;Walker   Chair/bed transfer details: Verbal cues for sequencing;Verbal cues for Careers information officer transfer assistive device: Licensed conveyancer     Max distance: 200 Assist level: Supervision or verbal cues  Walk 10 feet activity   Assist level: Supervision or verbal cues  Walk 50 feet with 2 turns activity   Assist level: Supervision or verbal cues  Walk 150 feet activity   Assist level: Supervision or verbal cues  Walk 10 feet on uneven surfaces activity Walk 10 feet on uneven surfaces activity did not occur: Safety/medical concerns  Stairs          Walk up/down 1 step activity        Walk up/down 4 steps activity      Walk up/down 12 steps activity      Pick up small objects  from floor      Wheelchair          Wheel 50 feet with 2 turns activity      Wheel 150 feet activity         Therapy/Group: Individual Therapy  [name] 04/05/2015, 12:22 PM

## 2015-04-06 ENCOUNTER — Inpatient Hospital Stay (HOSPITAL_COMMUNITY): Payer: Medicare Other | Admitting: Occupational Therapy

## 2015-04-06 LAB — OCCULT BLOOD X 1 CARD TO LAB, STOOL: FECAL OCCULT BLD: NEGATIVE

## 2015-04-06 MED ORDER — SENNOSIDES-DOCUSATE SODIUM 8.6-50 MG PO TABS
2.0000 | ORAL_TABLET | Freq: Two times a day (BID) | ORAL | Status: DC
  Administered 2015-04-06 – 2015-04-08 (×5): 2 via ORAL
  Filled 2015-04-06 (×5): qty 2

## 2015-04-06 NOTE — Progress Notes (Signed)
Occupational Therapy Session Note  Patient Details  Name: Leah Figueroa MRN: [id number] Date of Birth: [birth year]  Today's Date: 04/06/2015 OT Individual Time: 1345-1430 OT Individual Time Calculation (min): 45 min    Short Term Goals: Week 2:  OT Short Term Goal 1 (Week 2): STG = LTGs due to remaining LOS  Skilled Therapeutic Interventions/Progress Updates:  Upon entering the room, pt seated in wheelchair with family present in room. Pt required min verbal cues for safety for transfers and ambulation as she was impulsive and trying to stand from wheelchair without brakes applied. Pt performed toileting with steady assist this session. See below for details. Pt then ambulating 150' with RWto ADL apartment to make up bed for functional balance and coordination task. Pt with 1 LOB requiring mod A to correct. OT maintaining min A for balance during bed making tasks secondary to pt impulsivity and balance. Pt engaged in 2 seated rest breaks secondary to fatigue. Pt then returning to room with RW in same manner as stated below. Pt returned to sit in wheelchair with family remaining in room and requesting to leave off quick release belt. Call bell and all needed items within reach upon exiting the room.   Therapy Documentation Precautions:  Precautions Precautions: Fall Restrictions Weight Bearing Restrictions: No Other Position/Activity Restrictions: monitor L wrist pain, did not weight bear during eval due to c/o pain Vital Signs: Therapy Vitals Temp: 98.3 F (36.8 C) Temp Source: Oral Pulse Rate: (!) 59 Resp: 18 BP: 113/61 mmHg Patient Position (if appropriate): Sitting Oxygen Therapy SpO2: 97 % O2 Device: Not Delivered ADL: ADL ADL Comments: see FIM  Function:   Toileting Toileting   Toileting steps completed by patient: Adjust clothing prior to toileting;Performs perineal hygiene;Adjust clothing after toileting   Toileting Assistive Devices: Grab bar or rail  Toileting  assist Assist level: Touching or steadying assistance (Pt.75%)    Transfers Sit to stand transfer   Sit to stand assist level: Touching or steadying assistance (Pt > 75%/lift 1 leg)    Chair/bed transfer              Toilet transfer   Toilet transfer assistive device: Grab bar       Assist level to toilet: Touching or steadying assistance (Pt > 75%) Assist level from toilet: Touching or steadying assistance (Pt > 75%)  Tub/shower transfer             Cognition Comprehension Comprehension assist level: Follows complex conversation/direction with extra time/assistive device  Expression Expression assist level: Expresses basic needs/ideas: With extra time/assistive device  Social Interaction Social Interaction assist level: Interacts appropriately 90% of the time - Needs monitoring or encouragement for participation or interaction.  Problem Solving Problem solving assist level: Solves basic 50 - 74% of the time/requires cueing 25 - 49% of the time  Memory Memory assist level: Recognizes or recalls 50 - 74% of the time/requires cueing 25 - 49% of the time    Therapy/Group: Individual Therapy  Lowella Grip 04/06/2015, 3:23 PM

## 2015-04-06 NOTE — Progress Notes (Addendum)
Subjective/Complaints:  Sitting at edge of bed eating breakfast Review of Systems - Negative except weakness in Left hand, neg  Left wrist pain , neg SOB, CP or abd pain Objective: Vital Signs: Blood pressure 140/58, pulse 59, temperature 98 F (36.7 C), temperature source Oral, resp. rate 20, height  (1.549 m), weight 47.718 kg (105 lb 3.2 oz), SpO2 96 %. No results found. No results found for this or any previous visit (from the past 72 hour(s)).   HEENT: normal Cardio: RRR and no murmur Resp: CTA B/L and unlabored GI: BS positive and NT, ND Extremity:  Pulses positive and No Edema Skin:   Other erythema Left wrist Neuro: Alert/Oriented, Cranial Nerve II-XII normal, Abnormal Sensory absent LT on Left side UE> LE, Abnormal Motor 3- LUE, 4- LLE, Abnormal FMC Ataxic/ dec FMC and Inattention Musc/Skel: no erythema and swelling left wrist, no pain with ROM, mild tenderness to palpation , no other jts swollen in  in UE or LE Gen NAD   Assessment/Plan: 1. Functional deficits secondary to Right MCA infarct with left hemiparesis and L neglect which require 3+ hours per day of interdisciplinary therapy in a comprehensive inpatient rehab setting. Physiatrist is providing close team supervision and 24 hour management of active medical problems listed below. Physiatrist and rehab team continue to assess barriers to discharge/monitor patient progress toward functional and medical goals.   FIM: Function - Bathing Position: Shower Body parts bathed by patient: Right arm, Left arm, Chest, Abdomen, Front perineal area, Buttocks, Right upper leg, Left upper leg Body parts bathed by helper: Back Assist Level: Touching or steadying assistance(Pt > 75%)  Function- Upper Body Dressing/Undressing What is the patient wearing?: Pull over shirt/dress Bra - Perfomed by patient: Thread/unthread left bra strap Bra - Perfomed by helper: Thread/unthread right bra strap, Hook/unhook bra (pull down  sports bra) Pull over shirt/dress - Perfomed by patient: Thread/unthread right sleeve, Thread/unthread left sleeve, Put head through opening, Pull shirt over trunk Pull over shirt/dress - Perfomed by helper: Put head through opening, Thread/unthread right sleeve Assist Level: More than reasonable time, Supervision or verbal cues (max verbal cues for sequencing and problem solving) Set up : To obtain clothing/put away Function - Lower Body Dressing/Undressing Lower body dressing/undressing activity did not occur: 2: Max-Patient completed 25-49% of tasks What is the patient wearing?: Pants, [employer], Shoes, Underwear Position: Research officer, trade union at [employer] - Performed by patient: Thread/unthread right underwear leg, Thread/unthread left underwear leg, Pull underwear up/down Underwear - Performed by helper: Pull underwear up/down Pants- Performed by patient: Thread/unthread right pants leg, Thread/unthread left pants leg, Pull pants up/down Pants- Performed by helper: Thread/unthread left pants leg Shoes - Performed by patient: Don/doff right shoe Shoes - Performed by helper: Don/doff left shoe TED Hose - Performed by helper: Don/doff right TED hose, Don/doff left TED hose Assist Level: More than reasonable time, Set up, Supervision or verbal cues, Touching or steadying assistance (Pt > 75%) (max verbal cues for sequencing and problem solving) Set up : To obtain clothing/put away, Don/doff TED stockings  Function - Toileting Toileting steps completed by patient: Adjust clothing prior to toileting, Performs perineal hygiene, Adjust clothing after toileting Toileting steps completed by helper: Adjust clothing after toileting Toileting Assistive Devices: Grab bar or rail Assist level: Touching or steadying assistance (Pt.75%)  Function - Toilet Transfers Toilet transfer assistive device: Grab bar Assist level to toilet: Touching or steadying assistance (Pt > 75%) Assist level from toilet:  Touching or steadying assistance (  Pt > 75%)  Function - Chair/bed transfer Chair/bed transfer method: Stand pivot Chair/bed transfer assist level: Touching or steadying assistance (Pt > 75%) Chair/bed transfer assistive device: Walker Chair/bed transfer details: Verbal cues for sequencing, Verbal cues for technique  Function - Locomotion: Ambulation Assistive device: Walker-rolling Max distance: 200 Assist level: Supervision or verbal cues Assist level: Supervision or verbal cues Assist level: Supervision or verbal cues Assist level: Supervision or verbal cues Walk 10 feet on uneven surfaces activity did not occur: Safety/medical concerns  Function - Comprehension Comprehension: Auditory Comprehension assistive device: Other (Pt uses hearing aids, but didnot bring to hospital) Comprehension assist level: Follows basic conversation/direction with extra time/assistive device  Function - Expression Expression: Verbal Expression assist level: Expresses basic needs/ideas: With extra time/assistive device  Function - Social Interaction Social Interaction assist level: Interacts appropriately 90% of the time - Needs monitoring or encouragement for participation or interaction.  Function - Problem Solving Problem solving assist level: Solves basic 50 - 74% of the time/requires cueing 25 - 49% of the time  Function - Memory Memory assist level: Recognizes or recalls 50 - 74% of the time/requires cueing 25 - 49% of the time Patient normally able to recall (first 3 days only): That he or she is in a hospital, Current season Medical Problem List and Plan: 1. Functional deficits secondary to Right MCA infarct likely thrombotic, Left hemisensory deficits causing decreased use despite motor gains 2. DVT Prophylaxis/Anticoagulation: Pharmaceutical: Lovenox, no overt signs of bleeding plt nl 3. Pain Management: tylenol prn for HA.no pain issues currently 4. Mood: LCSW to follow for evaluation  and support.  5. Neuropsych: This patient is capable of making decisions on her own behalf. 6. Skin/Wound Care: Encourage adequate nutrition and hydration status. Routine pressure relief measures.  7. Fluids/Electrolytes/Nutrition: Monitor I/O. Normal  lytes despite fair intake, 50-100%meals, ~600 ml fluids yest 8. HTN: Monitor BP tid. Continue metoprolol and Norvasc. BPs elevated this am but has been controlled,140/58 this am but lower yest 9. Hypothyroid: Continue supplement.  10.  Left wrist OA + pseudogout,  Acute episode resolved  11.  Anemia- Hgb stable, ordered stool guaic but no BM x 4d, 12 Constipation - adjust laxatives,  enema today LOS (Days) 11 A FACE TO FACE EVALUATION WAS PERFORMED  Leah Figueroa E 04/06/2015, 7:36 AM

## 2015-04-07 ENCOUNTER — Inpatient Hospital Stay (HOSPITAL_COMMUNITY): Payer: Medicare Other | Admitting: Physical Therapy

## 2015-04-07 ENCOUNTER — Inpatient Hospital Stay (HOSPITAL_COMMUNITY): Payer: Medicare Other | Admitting: Speech Pathology

## 2015-04-07 ENCOUNTER — Inpatient Hospital Stay (HOSPITAL_COMMUNITY): Payer: PRIVATE HEALTH INSURANCE | Admitting: Occupational Therapy

## 2015-04-07 ENCOUNTER — Inpatient Hospital Stay (HOSPITAL_COMMUNITY): Payer: Medicare Other

## 2015-04-07 MED ORDER — CLOPIDOGREL BISULFATE 75 MG PO TABS: 75.0000 mg | ORAL_TABLET | Freq: Every day | ORAL | Status: DC

## 2015-04-07 MED ORDER — ALUM & MAG HYDROXIDE-SIMETH 200-200-20 MG/5ML PO SUSP: 30.0000 mL | ORAL | Status: DC | PRN

## 2015-04-07 MED ORDER — DICLOFENAC SODIUM 1 % TD GEL: 2.0000 g | Freq: Three times a day (TID) | TRANSDERMAL | Status: DC

## 2015-04-07 MED ORDER — BOOST / RESOURCE BREEZE PO LIQD: 1.0000 | Freq: Three times a day (TID) | ORAL | Status: DC

## 2015-04-07 MED ORDER — SENNOSIDES-DOCUSATE SODIUM 8.6-50 MG PO TABS: 2.0000 | ORAL_TABLET | Freq: Two times a day (BID) | ORAL | Status: DC

## 2015-04-07 MED ORDER — PRAVASTATIN SODIUM 40 MG PO TABS: 40.0000 mg | ORAL_TABLET | Freq: Every day | ORAL | Status: DC

## 2015-04-07 MED ORDER — KETOTIFEN FUMARATE 0.025 % OP SOLN: 1.0000 [drp] | Freq: Two times a day (BID) | OPHTHALMIC | Status: DC

## 2015-04-07 MED ORDER — ACETAMINOPHEN 325 MG PO TABS: 650.0000 mg | ORAL_TABLET | ORAL | Status: DC | PRN

## 2015-04-07 NOTE — Progress Notes (Signed)
Physical Therapy Session Note  Patient Details  Name: NAHJAE HOEG MRN: [id number] Date of Birth: [birth year]  Today's Date: 04/07/2015 PT Individual Time: 0815-0900 PT Individual Time Calculation (min): 45 min   Short Term Goals: Week 2:  PT Short Term Goal 1 (Week 2): =LTG due to ELOS  Skilled Therapeutic Interventions/Progress Updates:     Pt received seated on EOB eating breakfast; pt requests to finish breakfast before beginning therapy and pt missed 15 minutes skilled therapy. Upper and lower body dressing performed as described below. Bathroom transfer and hand washing, teeth brushing at sink with supervision/CGA as described below. Gait training to/from therapy gym without use of AD to assess balance strategies without AD; noted increased sway and unsteadiness with increased reliance on HHA and frequent reaching for support with UEs on therapist and on handrails, furniture. NMR for balance strategies performed with alternating LE tapping on 1" and 3" steps. Demonstrates posterior bias and frequent LOB in posterior direction, requiring mod/maxA from therapist and use of LEs on mat table to maintain balance. Pt ambulated back to room as mentioned above; remained seated in w/c with handoff to SLP for next session.    Therapy Documentation Precautions:  Precautions Precautions: Fall Restrictions Weight Bearing Restrictions: No Other Position/Activity Restrictions: monitor L wrist pain, did not weight bear during eval due to c/o pain Pain: Pain Assessment Pain Assessment: No/denies pain Pain Score: 0-No pain   Function:  Government social research officer Devices: Grab bar or rail    Transfers Sit to stand transfer        Chair/bed transfer   Chair/bed transfer method: Ambulatory Chair/bed transfer assist level: Touching or steadying assistance (Pt > 75%)     Chair/bed transfer details: Verbal cues for sequencing;Verbal cues for technique   Toilet transfer    Toilet transfer assistive device: Licensed conveyancer     Max distance: 125 Assist level: Touching or steadying assistance (Pt > 75%)  Walk 10 feet activity   Assist level: Touching or steadying assistance (Pt > 75%)  Walk 50 feet with 2 turns activity   Assist level: Touching or steadying assistance (Pt > 75%)  Walk 150 feet activity      Walk 10 feet on uneven surfaces activity      Stairs          Walk up/down 1 step activity        Walk up/down 4 steps activity      Walk up/down 12 steps activity      Pick up small objects from floor      Wheelchair          Wheel 50 feet with 2 turns activity      Wheel 150 feet activity        Therapy/Group: Individual Therapy  [name] 04/07/2015, 9:53 AM

## 2015-04-07 NOTE — Progress Notes (Signed)
Social Work Patient ID: Leah Figueroa, female   DOB: [birth year], 81 y.o.   MRN: [id number] Bed offer from [facility] for tomorrow. Met with pt and daughter in room and both are agreeable to this plan.  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 Team aware and MD reports no medical issues.

## 2015-04-07 NOTE — Progress Notes (Signed)
Subjective/Complaints: No N/V Small BM this am, medium BM 8/21 Review of Systems - Negative except weakness in Left hand, neg  Left wrist pain , neg SOB, CP or abd pain Objective: Vital Signs: Blood pressure 147/58, pulse 60, temperature 98.2 F (36.8 C), temperature source Oral, resp. rate 20, height  (1.549 m), weight 47.718 kg (105 lb 3.2 oz), SpO2 96 %. No results found. Results for orders placed or performed during the hospital encounter of 03/26/15 (from the past 72 hour(s))  Occult blood card to lab, stool RN will collect     Status: None   Collection Time: 04/06/15  5:50 PM  Result Value Ref Range   Fecal Occult Bld NEGATIVE NEGATIVE     HEENT: normal Cardio: RRR and no murmur Resp: CTA B/L and unlabored GI: BS positive and NT, ND Extremity:  Pulses positive and No Edema Skin:   Other erythema Left wrist Neuro: Alert/Oriented, Cranial Nerve II-XII normal, Abnormal Sensory absent LT on Left side UE> LE, Abnormal Motor 3- LUE, 4- LLE, Abnormal FMC Ataxic/ dec FMC and Inattention Musc/Skel: no erythema and swelling left wrist, no pain with ROM, mild tenderness to palpation , no other jts swollen in  in UE or LE Gen NAD   Assessment/Plan: 1. Functional deficits secondary to Right MCA infarct with left hemiparesis and L neglect which require 3+ hours per day of interdisciplinary therapy in a comprehensive inpatient rehab setting. Physiatrist is providing close team supervision and 24 hour management of active medical problems listed below. Physiatrist and rehab team continue to assess barriers to discharge/monitor patient progress toward functional and medical goals.   FIM: Function - Bathing Position: Shower Body parts bathed by patient: Right arm, Left arm, Chest, Abdomen, Front perineal area, Buttocks, Right upper leg, Left upper leg Body parts bathed by helper: Back Assist Level: Touching or steadying assistance(Pt > 75%)  Function- Upper Body  Dressing/Undressing What is the patient wearing?: Pull over shirt/dress Bra - Perfomed by patient: Thread/unthread left bra strap Bra - Perfomed by helper: Thread/unthread right bra strap, Hook/unhook bra (pull down sports bra) Pull over shirt/dress - Perfomed by patient: Thread/unthread right sleeve, Thread/unthread left sleeve, Put head through opening, Pull shirt over trunk Pull over shirt/dress - Perfomed by helper: Put head through opening, Thread/unthread right sleeve Assist Level: More than reasonable time, Supervision or verbal cues (max verbal cues for sequencing and problem solving) Set up : To obtain clothing/put away Function - Lower Body Dressing/Undressing Lower body dressing/undressing activity did not occur: 2: Max-Patient completed 25-49% of tasks What is the patient wearing?: Pants, [employer], Shoes, Underwear Position: Research officer, trade union at [employer] - Performed by patient: Thread/unthread right underwear leg, Thread/unthread left underwear leg, Pull underwear up/down Underwear - Performed by helper: Pull underwear up/down Pants- Performed by patient: Thread/unthread right pants leg, Thread/unthread left pants leg, Pull pants up/down Pants- Performed by helper: Thread/unthread left pants leg Shoes - Performed by patient: Don/doff right shoe Shoes - Performed by helper: Don/doff left shoe TED Hose - Performed by helper: Don/doff right TED hose, Don/doff left TED hose Assist Level: More than reasonable time, Set up, Supervision or verbal cues, Touching or steadying assistance (Pt > 75%) (max verbal cues for sequencing and problem solving) Set up : To obtain clothing/put away, Don/doff TED stockings  Function - Toileting Toileting steps completed by patient: Adjust clothing prior to toileting, Performs perineal hygiene, Adjust clothing after toileting Toileting steps completed by helper: Adjust clothing after toileting Toileting Assistive Devices:  Grab bar or rail Assist  level: Touching or steadying assistance (Pt.75%)  Function - Archivist transfer assistive device: Grab bar Assist level to toilet: Touching or steadying assistance (Pt > 75%) Assist level from toilet: Touching or steadying assistance (Pt > 75%)  Function - Chair/bed transfer Chair/bed transfer method: Stand pivot Chair/bed transfer assist level: Touching or steadying assistance (Pt > 75%) Chair/bed transfer assistive device: Walker Chair/bed transfer details: Verbal cues for sequencing, Verbal cues for technique  Function - Locomotion: Ambulation Assistive device: Walker-rolling Max distance: 150' Assist level: Supervision or verbal cues Assist level: Supervision or verbal cues Assist level: Supervision or verbal cues Assist level: Supervision or verbal cues Walk 10 feet on uneven surfaces activity did not occur: Safety/medical concerns  Function - Comprehension Comprehension: Auditory Comprehension assistive device: Other (Pt uses hearing aids, but didnot bring to hospital) Comprehension assist level: Follows complex conversation/direction with extra time/assistive device  Function - Expression Expression: Verbal Expression assist level: Expresses basic needs/ideas: With extra time/assistive device  Function - Social Interaction Social Interaction assist level: Interacts appropriately 90% of the time - Needs monitoring or encouragement for participation or interaction.  Function - Problem Solving Problem solving assist level: Solves basic 50 - 74% of the time/requires cueing 25 - 49% of the time  Function - Memory Memory assist level: Recognizes or recalls 50 - 74% of the time/requires cueing 25 - 49% of the time Patient normally able to recall (first 3 days only): That he or she is in a hospital, Current season Medical Problem List and Plan: 1. Functional deficits secondary to Right MCA infarct likely thrombotic, Left hemisensory deficits causing decreased use  despite motor gains 2. DVT Prophylaxis/Anticoagulation: Pharmaceutical: Lovenox, no overt signs of bleeding plt nl 3. Pain Management: tylenol prn for HA.no pain issues currently 4. Mood: LCSW to follow for evaluation and support.  5. Neuropsych: This patient is capable of making decisions on her own behalf. 6. Skin/Wound Care: Encourage adequate nutrition and hydration status. Routine pressure relief measures.  7. Fluids/Electrolytes/Nutrition: Monitor I/O. Normal  lytes despite fair intake, 100%meals, 480 ml fluids yest 8. HTN: Monitor BP tid. Continue metoprolol and Norvasc. BPs elevated this am but has been controlled,147/58 this am but 113-132 yest 9. Hypothyroid: Continue supplement.  10.  Left wrist OA + pseudogout,  Acute episode resolved  11.  Anemia- Hgb stable, Negative stool guaic  12 Constipation - responding to senna S 2 po BID LOS (Days) 12 A FACE TO FACE EVALUATION WAS PERFORMED  Royann Wildasin E 04/07/2015, 7:28 AM

## 2015-04-07 NOTE — Progress Notes (Signed)
Occupational Therapy Session Note  Patient Details  Name: Leah Figueroa MRN: [id number] Date of Birth: August 07, 1923  Today's Date: 04/07/2015 OT Individual Time: 1000-1100 OT Individual Time Calculation (min): 60 min    Short Term Goals: Week 2:  OT Short Term Goal 1 (Week 2): STG = LTGs due to remaining LOS  Skilled Therapeutic Interventions/Progress Updates:    ADL retraining with focus on Lt attention and functional use of LUE during functional tasks.  Engaged in LB dressing with focus on donning her stockings and shoes with pt requiring increased cues and time.  Functional homemaking tasks with focus on LUE use with placing pillow cases on pillows with pt requiring 10-15 mins to complete one pillow secondary to decreased attention to task, attention to LUE, and dropping both pillow and pillow case multiple times.  When therapist asked pt why task was difficult, she had no awareness of deficits.  LUE FMC task in standing with focus on visually attending to LUE during task with noted improvements when focusing solely on table top task.  Therapy Documentation Precautions:  Precautions Precautions: Fall Restrictions Weight Bearing Restrictions: No Other Position/Activity Restrictions: monitor L wrist pain, did not weight bear during eval due to c/o pain General: General PT Missed Treatment Reason: Other (Comment) (eating breakfast) Vital Signs:  Pain: Pain Assessment Pain Assessment: No/denies pain Pain Score: 0-No pain  Function:   Grooming Oral Care,Brush Teeth, Clean Dentures Activity:      Assist Level: Supervision or verbal cues      Wash, Rinse, Dry Face Activity          Wash, Rinse, Dry Hands Activity   Assist Level: Supervision or verbal cues      Brush, Comb Hair Activity        Shave Activity          Apply Makeup Activity            Upper Body Dressing/Undressing Upper body dressing   What is the patient wearing?: Pull over shirt/dress      Pull over shirt/dress - Perfomed by patient: Pull shirt over trunk;Put head through opening Pull over shirt/dress - Perfomed by helper: Thread/unthread right sleeve;Thread/unthread left sleeve        Upper body assist Assist Level: Touching or steadying assistance(Pt > 75%);More than reasonable time;Set up   Set up : To obtain clothing/put away   Lower Body Dressing/Undressing Lower body dressing   What is the patient wearing?: Socks;Shoes     Pants- Performed by patient: Pull pants up/down Pants- Performed by helper: Thread/unthread left pants leg;Thread/unthread right pants leg     Socks - Performed by patient: Don/doff right sock;Don/doff left sock   Shoes - Performed by patient: Don/doff right shoe;Don/doff left shoe Shoes - Performed by helper: Don/doff left shoe       TED Hose - Performed by helper: Don/doff right TED hose;Don/doff left TED hose  Lower body assist Assist Level: Supervision or verbal cues (with socks and shoes only)   Set up : To obtain clothing/put away;Don/doff TED stockings   Toileting Toileting   Toileting steps completed by patient: Adjust clothing prior to toileting;Performs perineal hygiene;Adjust clothing after toileting   Toileting Assistive Devices: Grab bar or rail  Toileting assist Assist level: Touching or steadying assistance (Pt.75%)      Transfers Sit to stand transfer        Chair/bed transfer   Chair/bed transfer method: Ambulatory Chair/bed transfer assist level: Touching or steadying assistance (Pt >  75%)     Chair/bed transfer details: Verbal cues for sequencing;Verbal cues for technique  Toilet transfer   Toilet transfer assistive device: Grab bar       Assist level to toilet: Touching or steadying assistance (Pt > 75%) Assist level from toilet: Touching or steadying assistance (Pt > 75%)  Tub/shower transfer             Cognition Comprehension Comprehension assist level: Follows complex  conversation/direction with extra time/assistive device  Expression Expression assist level: Expresses basic needs/ideas: With extra time/assistive device  Social Interaction Social Interaction assist level: Interacts appropriately 90% of the time - Needs monitoring or encouragement for participation or interaction.  Problem Solving Problem solving assist level: Solves basic 50 - 74% of the time/requires cueing 25 - 49% of the time  Memory Memory assist level: Recognizes or recalls 50 - 74% of the time/requires cueing 25 - 49% of the time    Therapy/Group: Individual Therapy  Rosalio Loud 04/07/2015, 12:27 PM

## 2015-04-07 NOTE — Plan of Care (Signed)
Problem: RH Problem Solving Goal: LTG Patient will demonstrate problem solving for (SLP) LTG: Patient will demonstrate problem solving for basic/complex daily situations with cues (SLP)  Outcome: Not Met (add Reason) Pt is min assist for basic tasks only, requires increased cuing for complex tasks

## 2015-04-07 NOTE — Discharge Summary (Signed)
Physician Discharge Summary  Patient ID: ZULEIMA HASER MRN: [id number] DOB/AGE: 03-21-1923 81 y.o.  Admit date: 03/26/2015 Discharge date: 04/08/2015  Discharge Diagnoses:  Principal Problem:   Thrombotic stroke involving right middle cerebral artery Active Problems:   Essential hypertension   Anemia   Left hemiplegia   Homonymous hemianopia   Pseudogout of left wrist   Discharged Condition: Stable.   Significant Diagnostic Studies: Dg Wrist 2 Views Left  03/27/2015   CLINICAL DATA:  81 year old female with bruising and redness in the left proximal wrist.  EXAM: LEFT WRIST - 2 VIEW  COMPARISON:  No priors.  FINDINGS: Extensive degenerative calcification is noted adjacent to the distal aspect of the ulna immediately proximal to the proximal carpal row well, likely to reflect underlying calcium pyrophosphate deposition disease (CPPD) arthropathy. There is multifocal joint space narrowing, subchondral sclerosis, subchondral cyst formation and osteophyte formation, most pronounced at the first [facility] joint, compatible with osteoarthritis. No acute displaced fracture, subluxation or dislocation.  IMPRESSION: 1. Advanced degenerative changes in the left wrist, as above, including a combination of osteoarthritis and advanced CPPD arthropathy. 2. No acute findings.   Electronically Signed   By: Trudie Reed M.D.   On: 03/27/2015 14:17    Labs:  Basic Metabolic Panel: BMP Latest Ref Rng 03/31/2015 03/27/2015 03/23/2015  Glucose 65 - 99 mg/dL 96 409(W) 119(J)  BUN 6 - 20 mg/dL 12 7 16   Creatinine 0.44 - 1.00 mg/dL 4.78 2.95 6.21  Sodium 135 - 145 mmol/L 137 139 142  Potassium 3.5 - 5.1 mmol/L 3.8 3.3(L) 3.7  Chloride 101 - 111 mmol/L 102 103 106  CO2 22 - 32 mmol/L 25 25 -  Calcium 8.9 - 10.3 mg/dL 9.1 9.0 -     CBC: CBC Latest Ref Rng 04/08/2015 03/31/2015 03/27/2015  WBC 4.0 - 10.5 K/uL 4.5 4.8 6.1  Hemoglobin 12.0 - 15.0 g/dL 10.2(L) 9.8(L) 9.6(L)  Hematocrit 36.0 - 46.0 % 33.1(L) 31.7(L)  31.0(L)  Platelets 150 - 400 K/uL 273 235 194    Today's Vitals   04/07/15 1534 04/07/15 1948 04/08/15 0619 04/08/15 0934  BP:  163/58 127/57 133/59  Pulse:  61 54 63  Temp:   97.6 F (36.4 C)   TempSrc:   Oral   Resp:   17   Height:      Weight:      SpO2:   97%   PainSc: 0-No pain      CBG: No results for input(s): GLUCAP in the last 168 hours.  Brief HPI:   Leah Figueroa is a 81 y.o. female with history of HTN, HA, who was at home with family when she started to lean to the left then fell with attempts at waking. She was evaluated in ED on 08/06 and developed left sided weakness after presentation and received t-PA. MRI/MRA brain done revealing Right MCA infarct affecting frontoparietal lobe, subcentimeter right pontine infarct, severe white mater changes due to SVD and focal high grade stenosis left A2 segment. 2D echo with EF 60-65% with apical akinesis, moderate concentric LVH and moderately calcified MV.  Dr. Pearlean Brownie recommended ASA for R-MCA infarct of unknown etiology. Patient with resultant left sided weakness with sensory deficits, left visual field deficits, left inattention as well as mild cognitive deficits.  CIR  Was recommended for follow up therapy.     Hospital Course: Leah Figueroa was admitted to rehab 03/26/2015 for inpatient therapies to consist of PT, ST and OT at least three hours  five days a week. Past admission physiatrist, therapy team and rehab RN have worked together to provide customized collaborative inpatient rehab. Patient had significant left wrist pain at admission affecting sleep as well as activity.  X rays done revealed pseudogout and Voltaren gel was added to help with symptoms. This  has been very effective in pain management. Blood pressures have been monitored on bid basis and show occasional elevation.  She continues on low dose Norvasc and metoprolol to prevent hypotensive episodes.  CBC at admission revealed anemia with Hgb at 9.6. No signs of  bleeding noted and stool guaiac X one has been negative. Repeat CBC shows H/H is stable and she needs to follow up with PMD for further work up of anemia after discharge.  Po intake has been good and she  is continent of bowel and bladder.  She has been making steady progress during her rehab stay but continues to require assistance with problem solving, memory as well as safety concerns due to left visual field deficits. Family is unable to provide supervision at discharge and has elected on SNF for further therapies. Bed is available at [facility] and patient was discharged on 04/08/15 in improved condition.    Rehab course: During patient's stay in rehab weekly team conferences were held to monitor patient's progress, set goals and discuss barriers to discharge. At admission patient required max assist with basic Friese care needs and moderate assistance with mobility. She had moderate cognitive impairments affecting safety, attention, problems solving and recall.  She has had improvement in activity tolerance, balance, postural control, as well as ability to compensate for deficits. She is has had improvement in functional use LUE  and LLE as well as improved awareness. She requires steady assist with verbal cues for safety with mobility. She requires min assist for balance challenges and is able to ambulate 150' with RW and supervision.  She requires steady assist for bathing and is max assist for lower body dressing. She is able to complete upper dressing with supervision.  She rcurrently equires minimal assit for basic and familiar tasks due to left inattention, decreased recall, problem solving deficits as well as decreased awareness of deficits.     Disposition:  Skilled Nursing Facility  Diet: Heart Healthy  Special Instructions: 1. Follow up with primary MD for anemia workup.     Medication List    STOP taking these medications        aspirin EC 81 MG tablet     furosemide 20 MG tablet   Commonly known as:  LASIX     ibuprofen 200 MG tablet  Commonly known as:  ADVIL,MOTRIN     lovastatin 20 MG tablet  Commonly known as:  MEVACOR  Replaced by:  pravastatin 40 MG tablet      TAKE these medications        acetaminophen 325 MG tablet  Commonly known as:  TYLENOL  Take 2 tablets (650 mg total) by mouth every 4 (four) hours as needed for mild pain.     alum & mag hydroxide-simeth 200-200-20 MG/5ML suspension  Commonly known as:  MAALOX/MYLANTA  Take 30 mLs by mouth every 4 (four) hours as needed for indigestion.     amLODipine 2.5 MG tablet  Commonly known as:  NORVASC  Take 2.5 mg by mouth daily.     clopidogrel 75 MG tablet  Commonly known as:  PLAVIX  Take 1 tablet (75 mg total) by mouth daily.  diclofenac sodium 1 % Gel  Commonly known as:  VOLTAREN  Apply 2 g topically 3 (three) times daily. To left wrist     feeding supplement Liqd  Take 1 Container by mouth 3 (three) times daily between meals.     ketotifen 0.025 % ophthalmic solution  Commonly known as:  ZADITOR  Place 1 drop into both eyes 2 (two) times daily.     levothyroxine 25 MCG tablet  Commonly known as:  SYNTHROID, LEVOTHROID  Take 1 tablet by mouth  daily     metoprolol 50 MG tablet  Commonly known as:  LOPRESSOR  Take 1 tablet by mouth two  times daily     pravastatin 40 MG tablet  Commonly known as:  PRAVACHOL  Take 1 tablet (40 mg total) by mouth daily at 6 PM.     senna-docusate 8.6-50 MG per tablet  Commonly known as:  Senokot-S  Take 2 tablets by mouth 2 (two) times daily.       Follow-up Information    Follow up with Erick Colace, MD On 05/05/2015.   Specialty:  Physical Medicine and Rehabilitation   Why:  Be there at 9 am for 9:30 am  appointment    Contact information:   [address] [place] [address] [phone]       Follow up with Delia Heady, MD. Call today.   Specialties:  Neurology, Radiology   Why:  for follow up appointment  in 4-6 weeks.    Contact information:   [address] [place] [address] [phone]       Call Ginette Otto, MD.   Specialty:  Internal Medicine   Why:  Follow up for recheck 2 weeks after discharge from SNF.    Contact information:   [address] [place] [address] [phone]       Signed: Jacquelynn Cree 04/08/2015, 9:45 AM

## 2015-04-07 NOTE — Progress Notes (Signed)
Speech Language Pathology Discharge Summary  Patient Details  Name: Leah Figueroa MRN: [id number] Date of Birth: 11/01/1922  Today's Date: 04/07/2015 SLP Individual Time: 0901-1000 SLP Individual Time Calculation (min): 59 min   Skilled Therapeutic Interventions:  Pt was seen for skilled ST targeting cognitive goals.  Upon arrival, pt was seated upright in wheelchair, awake, alert, and agreeable to participate in [place].  SLP facilitated the session with a novel card game targeting functional problem solving.  Pt required max faded to min assist verbal cues to plan and execute a problem solving strategy during the abovementioned task.  She also utilized a written aid to facilitate recall of task rules with min assist.  Pt required mod assist verbal cues for redirection to task due to impulsivity and distractibility.  Pt initiated request to use the restroom and required min assist verbal cues to apply brakes prior to transfer.  Pt was left in recliner with quick release belt donned and call bell within reach.  Continue per current plan of care.      Patient has met 3 of 4 long term goals.  Patient to discharge at [facility] level.  Reasons goals not met: Pt still requires min assist for basic cognitive tasks, increased cuing needed for more complex tasks    Clinical Impression/Discharge Summary:  Pt made slow functional gains while inpatient and is discharging having met 3 out of 4 long term goals.   Of note, long term goals were recently downgraded to slow progress.  Pt currently requires min assist during basic, familiar tasks due to left inattention, decreased selective attention to tasks, decreased functional problem solving, decreased recall of new information, and poor intellectual awareness of deficits.  Pt is discharging to SNF where it is recommended that she have follow up ST services to continue to address cognitive deficits in order to maximize functional independence and reduce burden of  care prior to discharge home.  Also recommend continuing family education at next level of care due to minimal family participation in therapies while inpatient.    Care Partner:  Caregiver Able to Provide Assistance: Other (comment) (SNF )  Type of Caregiver Assistance:  (SNF )  Recommendation:  24 hour supervision/assistance;Skilled Nursing facility  Rationale for SLP Follow Up: Maximize cognitive function and independence;Reduce caregiver burden   Equipment: none recommended by SLP    Reasons for discharge: Discharged from hospital   Patient/Family Agrees with Progress Made and Goals Achieved: Yes   Function:  Eating Eating                 Cognition Comprehension Comprehension assist level: Follows complex conversation/direction with extra time/assistive device  Expression   Expression assist level: Expresses basic needs/ideas: With extra time/assistive device  Social Interaction Social Interaction assist level: Interacts appropriately 90% of the time - Needs monitoring or encouragement for participation or interaction.  Problem Solving Problem solving assist level: Solves basic 75 - 89% of the time/requires cueing 10 - 24% of the time  Memory Memory assist level: Recognizes or recalls 50 - 74% of the time/requires cueing 25 - 49% of the time   Emilio Math 04/07/2015, 12:42 PM

## 2015-04-07 NOTE — Progress Notes (Signed)
Physical Therapy Discharge Summary  Patient Details  Name: Leah Figueroa MRN: [id number] Date of Birth: 03/17/1923  Today's Date: 04/07/2015 PT Individual Time: 1500-1530 PT Individual Time Calculation (min): 30 min    Patient has met 12 of 12 long term goals due to improved activity tolerance, improved balance, improved postural control, increased strength, decreased pain, ability to compensate for deficits, functional use of  left upper extremity and left lower extremity, improved attention, improved awareness and improved coordination.  Patient to discharge at an ambulatory level Supervision.   Patient d/c to SNF due to decreased safety awareness, impulsivity, and balance impairments; family unable to provide 24/7 supervision.  Reasons goals not met: all goals met  Recommendation:  Patient will benefit from ongoing skilled PT services in skilled nursing facility setting to continue to advance safe functional mobility, address ongoing impairments in balance, strength, coordination, safety awareness, awareness of deficits, and minimize fall risk.  Equipment: No equipment provided  Reasons for discharge: treatment goals met and discharge from hospital  Patient/family agrees with progress made and goals achieved: Yes  PT Discharge Precautions/Restrictions Precautions Precautions: Fall Restrictions Weight Bearing Restrictions: No Vital Signs  Pain Pain Assessment Pain Assessment: No/denies pain Pain Score: 0-No pain Vision/Perception  Vision - Assessment Eye Alignment: Within Functional Limits  Cognition Overall Cognitive Status: Impaired/Different from baseline Arousal/Alertness: Awake/alert Orientation Level: Oriented X4 Attention: Selective Selective Attention: Impaired Selective Attention Impairment: Functional basic Memory: Impaired Memory Impairment: Decreased recall of new information;Decreased short term memory Decreased Short Term Memory: Verbal basic;Functional  basic Awareness: Impaired Awareness Impairment: Intellectual impairment Problem Solving: Impaired Problem Solving Impairment: Verbal basic;Functional basic Executive Function: Pinard Monitoring;[place] Correcting Decision Making: Impaired Decision Making Impairment: Functional basic;Verbal complex [place] Monitoring: Impaired Arutyunyan Monitoring Impairment: Verbal basic;Functional basic Peifer Correcting: Impaired Meiser Correcting Impairment: Verbal basic;Functional basic Behaviors: Impulsive;Perseveration Safety/Judgment: Impaired Comments: left inattention, impulsivity Sensation Sensation Light Touch: Appears Intact Stereognosis: Appears Intact Hot/Cold: Appears Intact Proprioception: Impaired by gross assessment (Impaired BLE hallux ) Coordination Gross Motor Movements are Fluid and Coordinated: No Fine Motor Movements are Fluid and Coordinated: No Heel Shin Test: Impaired coordination BLE L>R Motor  Motor Motor: Abnormal postural alignment and control;Ataxia;Hemiplegia Motor - Discharge Observations: Ataxia LUE/LLE, posterior bias in standing  Mobility Bed Mobility Bed Mobility: Rolling Right;Rolling Left;Supine to Sit;Sit to Supine Rolling Right: 6: Modified independent (Device/Increase time) Rolling Left: 6: Modified independent (Device/Increase time) Supine to Sit: 6: Modified independent (Device/Increase time) Sit to Supine: 6: Modified independent (Device/Increase time) Transfers Transfers: Yes Stand Pivot Transfers: 5: Supervision Stand Pivot Transfer Details (indicate cue type and reason): with RW, cues for hand placement on RW, reaching back for seat prior to sitting Locomotion  Ambulation Ambulation: Yes Ambulation/Gait Assistance: 5: Supervision Ambulation Distance (Feet): 200 Feet Assistive device: Rolling walker Ambulation/Gait Assistance Details: Verbal cues for precautions/safety;Verbal cues for technique;Verbal cues for gait pattern Gait Gait: Yes Gait Pattern:  Impaired Gait Pattern: Ataxic;Right flexed knee in stance;Left flexed knee in stance;Poor foot clearance - left;Poor foot clearance - right (variable BOS, variable step length/width) Gait velocity: decreased for age norms Stairs / Additional Locomotion Stairs: Yes Stairs Assistance: 5: Supervision Stairs Assistance Details: Verbal cues for sequencing;Verbal cues for precautions/safety;Tactile cues for placement Stair Management Technique: Two rails;Alternating pattern Number of Stairs: 12 Height of Stairs: 6 Wheelchair Mobility Wheelchair Mobility: No (Pt ambulatory at d/c)  Trunk/Postural Assessment  Cervical Assessment Cervical Assessment: Exceptions to [facility] (forward head posture) Thoracic Assessment Thoracic Assessment: Exceptions to [facility] (increased thoracic kyphosis) Lumbar Assessment  Lumbar Assessment: Exceptions to [facility] (decreased thoracic kyphosis) Postural Control Postural Control: Deficits on evaluation (decreased stepping strategies, posterior bias)  Balance Balance Balance Assessed: Yes Standardized Balance Assessment Standardized Balance Assessment: Berg Balance Test Berg Balance Test Sit to Stand: Able to stand  independently using hands Standing Unsupported: Able to stand 2 minutes with supervision Sitting with Back Unsupported but Feet Supported on Floor or Stool: Able to sit safely and securely 2 minutes Stand to Sit: Controls descent by using hands Transfers: Able to transfer with verbal cueing and /or supervision Standing Unsupported with Eyes Closed: Unable to keep eyes closed 3 seconds but stays steady Standing Ubsupported with Feet Together: Needs help to attain position and unable to hold for 15 seconds From Standing, Reach Forward with Outstretched Arm: Can reach forward >5 cm safely (2") From Standing Position, Pick up Object from Floor: Able to pick up shoe, needs supervision From Standing Position, Turn to Look Behind Over each Shoulder: Needs supervision  when turning Turn 360 Degrees: Needs assistance while turning Standing Unsupported, Alternately Place Feet on Step/Stool: Able to complete >2 steps/needs minimal assist Standing Unsupported, One Foot in Front: Loses balance while stepping or standing Standing on One Leg: Unable to try or needs assist to prevent fall Total Score: 23 Static Sitting Balance Static Sitting - Balance Support: Feet supported Static Sitting - Level of Assistance: 6: Modified independent (Device/Increase time) Static Sitting - Comment/# of Minutes: x 2 min EOB while donning shoes Dynamic Sitting Balance Dynamic Sitting - Balance Support: Feet supported Dynamic Sitting - Level of Assistance: 6: Modified independent (Device/Increase time) Dynamic Sitting - Balance Activities: Reaching for objects;Forward lean/weight shifting;Reaching across midline Static Standing Balance Static Standing - Balance Support: No upper extremity supported Static Standing - Level of Assistance: 5: Stand by assistance Static Standing - Comment/# of Minutes: x 2 min Static Stance: on Foam: significantly increased sway Static Stance: on Foam, Eyes Closed: NT Dynamic Standing Balance Dynamic Standing - Balance Support: During functional activity;No upper extremity supported Dynamic Standing - Level of Assistance: 5: Stand by assistance Dynamic Standing - Balance Activities: Reaching for objects;Lateral lean/weight shifting;Forward lean/weight shifting Dynamic Standing - Comments: BUE task reaching outside BOS Extremity Assessment  RUE Assessment RUE Assessment: Within Functional Limits LUE Assessment LUE Assessment: Exceptions to [facility] LUE Strength LUE Overall Strength: Deficits (defer to OT exam) RLE Assessment RLE Assessment: Within Functional Limits LLE Assessment LLE Assessment: Within Functional Limits  Function:  Toileting Toileting       Bed Mobility Roll left and right activity   Assist level: No help, No cues,  assistive device, takes more than a reasonable amount of time  Sit to lying activity   Assist level: No help, No cues, assistive device, takes more than a reasonable amount of time  Lying to sitting activity   Assist level: No help, No cues, assistive device, takes more than a reasonable amount of time  Mobility details     Transfers Sit to stand transfer   Sit to stand assist level: Supervision or verbal cues    Chair/bed transfer   Chair/bed transfer method: Ambulatory Chair/bed transfer assist level: Supervision or verbal cues Chair/bed transfer assistive device: Walker   Chair/bed transfer details: Verbal cues for sequencing;Verbal cues for Chemical engineer transfer assistive device: Publishing rights manager transfer assist level: Supervision or verbal cues    Locomotion Ambulation   Assistive device: No device Max distance:  150 Assist level: Supervision or verbal cues  Walk 10 feet activity   Assist level: Supervision or verbal cues  Walk 50 feet with 2 turns activity   Assist level: Supervision or verbal cues  Walk 150 feet activity   Assist level: Supervision or verbal cues  Walk 10 feet on uneven surfaces activity   Assist level: Touching or steadying assistance (Pt > 75%)  Stairs   Stairs assistive device: 2 hand rails   Stairs assist level: Supervision or verbal cues  Walk up/down 1 step activity     Walk up/down 1 step (curb) assist level: Supervision or verbal cues  Walk up/down 4 steps activity   Walk up/down 4 steps assist level: Supervision or verbal cues  Walk up/down 12 steps activity   Walk up/down 12 steps assist level: Supervision or verbal cues  Pick up small objects from floor   Assist level: Supervision or verbal cues  Wheelchair          Wheel 50 feet with 2 turns activity      Wheel 150 feet activity       Cognition Comprehension Comprehension assist level: Follows complex conversation/direction with extra  time/assistive device  Expression Expression assist level: Expresses basic needs/ideas: With extra time/assistive device  Social Interaction Social Interaction assist level: Interacts appropriately 90% of the time - Needs monitoring or encouragement for participation or interaction.  Problem Solving Problem solving assist level: Solves basic 75 - 89% of the time/requires cueing 10 - 24% of the time  Memory Memory assist level: Recognizes or recalls 50 - 74% of the time/requires cueing 25 - 49% of the time    Skilled Therapeutic Intervention: Pt received supine in bed with no c/o pain and agreeable to treatment. Assessed mobility in anticipation of d/c including gait, stairs, bed mobility in simulated apartment, car transfers; see above for details and amount of assist needed. Pt is supervision overall for all mobility due to L inattention, decreased coordination and attention in LUE, decreased awareness of deficits, impulsivity, poor memory/recall, and strength deficits. Pt returned to room and remained seated in recliner with family present all needs within reach. No QR belt donned due to family presence, however discussed with family use of QR belt if leaving pt alone, family states understanding.   Benjiman Core Tygielski 04/07/2015, 3:46 PM

## 2015-04-07 NOTE — Progress Notes (Addendum)
Recreational Therapy Discharge Summary Patient Details  Name: Leah Figueroa MRN: [id number] Date of Birth: 1923/03/07 Today's Date: 04/07/2015   Pt participated in animal assisted activity/therapy seated w/c level using BUE's to pet the dog.  Family present, participatory and appreciative.   Long term goals set: 1  Long term goals met: 1  Comments on progress toward goals: Pt is discharging tomorrow 8/23 to SNF for 24 hour supervision and continued therapies.  Pt met supervision level for simple TR task seated given extra time and verbal cues to attend to the left and LUE during task completion.  Reasons for discharge: discharge from hospital  Patient/family agrees with progress made and goals achieved: Yes  Leah Figueroa 04/07/2015, 2:54 PM

## 2015-04-08 ENCOUNTER — Inpatient Hospital Stay (HOSPITAL_COMMUNITY): Payer: PRIVATE HEALTH INSURANCE | Admitting: Occupational Therapy

## 2015-04-08 DIAGNOSIS — I6330 Cerebral infarction due to thrombosis of unspecified cerebral artery: Secondary | ICD-10-CM | POA: Diagnosis not present

## 2015-04-08 DIAGNOSIS — I63311 Cerebral infarction due to thrombosis of right middle cerebral artery: Secondary | ICD-10-CM | POA: Diagnosis not present

## 2015-04-08 DIAGNOSIS — I10 Essential (primary) hypertension: Secondary | ICD-10-CM | POA: Diagnosis not present

## 2015-04-08 DIAGNOSIS — R414 Neurologic neglect syndrome: Secondary | ICD-10-CM | POA: Diagnosis not present

## 2015-04-08 DIAGNOSIS — R5381 Other malaise: Secondary | ICD-10-CM | POA: Diagnosis not present

## 2015-04-08 DIAGNOSIS — H53469 Homonymous bilateral field defects, unspecified side: Secondary | ICD-10-CM | POA: Diagnosis not present

## 2015-04-08 DIAGNOSIS — I69354 Hemiplegia and hemiparesis following cerebral infarction affecting left non-dominant side: Secondary | ICD-10-CM | POA: Diagnosis not present

## 2015-04-08 DIAGNOSIS — I6931 Cognitive deficits following cerebral infarction: Secondary | ICD-10-CM | POA: Diagnosis not present

## 2015-04-08 DIAGNOSIS — R208 Other disturbances of skin sensation: Secondary | ICD-10-CM | POA: Diagnosis not present

## 2015-04-08 DIAGNOSIS — I6350 Cerebral infarction due to unspecified occlusion or stenosis of unspecified cerebral artery: Secondary | ICD-10-CM | POA: Diagnosis not present

## 2015-04-08 DIAGNOSIS — R278 Other lack of coordination: Secondary | ICD-10-CM | POA: Diagnosis not present

## 2015-04-08 DIAGNOSIS — R2681 Unsteadiness on feet: Secondary | ICD-10-CM | POA: Diagnosis not present

## 2015-04-08 DIAGNOSIS — I69359 Hemiplegia and hemiparesis following cerebral infarction affecting unspecified side: Secondary | ICD-10-CM | POA: Diagnosis not present

## 2015-04-08 DIAGNOSIS — E46 Unspecified protein-calorie malnutrition: Secondary | ICD-10-CM | POA: Diagnosis not present

## 2015-04-08 DIAGNOSIS — G8190 Hemiplegia, unspecified affecting unspecified side: Secondary | ICD-10-CM | POA: Diagnosis not present

## 2015-04-08 DIAGNOSIS — G8194 Hemiplegia, unspecified affecting left nondominant side: Secondary | ICD-10-CM | POA: Diagnosis not present

## 2015-04-08 DIAGNOSIS — M6281 Muscle weakness (generalized): Secondary | ICD-10-CM | POA: Diagnosis not present

## 2015-04-08 DIAGNOSIS — Z8673 Personal history of transient ischemic attack (TIA), and cerebral infarction without residual deficits: Secondary | ICD-10-CM | POA: Diagnosis not present

## 2015-04-08 DIAGNOSIS — K5792 Diverticulitis of intestine, part unspecified, without perforation or abscess without bleeding: Secondary | ICD-10-CM | POA: Diagnosis not present

## 2015-04-08 DIAGNOSIS — R079 Chest pain, unspecified: Secondary | ICD-10-CM | POA: Diagnosis not present

## 2015-04-08 DIAGNOSIS — I69398 Other sequelae of cerebral infarction: Secondary | ICD-10-CM | POA: Diagnosis not present

## 2015-04-08 DIAGNOSIS — R531 Weakness: Secondary | ICD-10-CM | POA: Diagnosis not present

## 2015-04-08 DIAGNOSIS — E785 Hyperlipidemia, unspecified: Secondary | ICD-10-CM | POA: Diagnosis not present

## 2015-04-08 DIAGNOSIS — M11832 Other specified crystal arthropathies, left wrist: Secondary | ICD-10-CM | POA: Diagnosis not present

## 2015-04-08 DIAGNOSIS — R252 Cramp and spasm: Secondary | ICD-10-CM | POA: Diagnosis not present

## 2015-04-08 DIAGNOSIS — I639 Cerebral infarction, unspecified: Secondary | ICD-10-CM | POA: Diagnosis not present

## 2015-04-08 DIAGNOSIS — K5900 Constipation, unspecified: Secondary | ICD-10-CM | POA: Diagnosis not present

## 2015-04-08 DIAGNOSIS — D649 Anemia, unspecified: Secondary | ICD-10-CM | POA: Diagnosis not present

## 2015-04-08 DIAGNOSIS — E039 Hypothyroidism, unspecified: Secondary | ICD-10-CM | POA: Diagnosis not present

## 2015-04-08 LAB — CBC WITH DIFFERENTIAL/PLATELET
BASOS ABS: 0 10*3/uL (ref 0.0–0.1)
BASOS PCT: 1 % (ref 0–1)
EOS ABS: 0.1 10*3/uL (ref 0.0–0.7)
Eosinophils Relative: 3 % (ref 0–5)
HEMATOCRIT: 33.1 % — AB (ref 36.0–46.0)
HEMOGLOBIN: 10.2 g/dL — AB (ref 12.0–15.0)
Lymphocytes Relative: 29 % (ref 12–46)
Lymphs Abs: 1.3 10*3/uL (ref 0.7–4.0)
MCH: 25.3 pg — ABNORMAL LOW (ref 26.0–34.0)
MCHC: 30.8 g/dL (ref 30.0–36.0)
MCV: 82.1 fL (ref 78.0–100.0)
MONOS PCT: 9 % (ref 3–12)
Monocytes Absolute: 0.4 10*3/uL (ref 0.1–1.0)
NEUTROS PCT: 58 % (ref 43–77)
Neutro Abs: 2.6 10*3/uL (ref 1.7–7.7)
Platelets: 273 10*3/uL (ref 150–400)
RBC: 4.03 MIL/uL (ref 3.87–5.11)
RDW: 16.9 % — AB (ref 11.5–15.5)
WBC: 4.5 10*3/uL (ref 4.0–10.5)

## 2015-04-08 NOTE — Discharge Instructions (Signed)
Inpatient Rehab Discharge Instructions  Velinda E Haskett Discharge date and time:    Activities/Precautions/ Functional Status: Activity: activity as tolerated Diet: low fat, low cholesterol diet Wound Care: none needed Functional status:  ___ No restrictions     ___ Walk up steps independently _X__ 24/7 supervision/assistance   ___ Walk up steps with assistance ___ Intermittent supervision/assistance  ___ Bathe/dress independently ___ Walk with walker     ___ Bathe/dress with assistance ___ Walk Independently    ___ Shower independently ___ Walk with assistance    ___ Shower with assistance ___ No alcohol     ___ Return to work/school ________  Special Instructions:  STROKE/TIA DISCHARGE INSTRUCTIONS SMOKING Cigarette smoking nearly doubles your risk of having a stroke & is the single most alterable risk factor  If you smoke or have smoked in the last 12 months, you are advised to quit smoking for your health.  Most of the excess cardiovascular risk related to smoking disappears within a year of stopping.  Ask you doctor about anti-smoking medications  [place] Quit Line: 1-800-QUIT NOW  Free Smoking Cessation Classes [phone]  CHOLESTEROL Know your levels; limit fat & cholesterol in your diet  Lipid Panel     Component Value Date/Time   CHOL 152 03/23/2015 0628   TRIG 54 03/23/2015 0628   HDL 47 03/23/2015 0628   CHOLHDL 3.2 03/23/2015 0628   VLDL 11 03/23/2015 0628   LDLCALC 94 03/23/2015 0628      Many patients benefit from treatment even if their cholesterol is at goal.  Goal: Total Cholesterol (CHOL) less than 160  Goal:  Triglycerides (TRIG) less than 150  Goal:  HDL greater than 40  Goal:  LDL (LDLCALC) less than 100   BLOOD PRESSURE American Stroke Association blood pressure target is less that 120/80 mm/Hg  Your discharge blood pressure is:  BP: (!) 127/57 mmHg  Monitor your blood pressure  Limit your salt and alcohol intake  Many individuals will  require more than one medication for high blood pressure  DIABETES (A1c is a blood sugar average for last 3 months) Goal HGBA1c is under 7% (HBGA1c is blood sugar average for last 3 months)  Diabetes: No known diagnosis of diabetes    Lab Results  Component Value Date   HGBA1C 5.8* 03/23/2015     Your HGBA1c can be lowered with medications, healthy diet, and exercise.  Check your blood sugar as directed by your physician  Call your physician if you experience unexplained or low blood sugars.  PHYSICAL ACTIVITY/REHABILITATION Goal is 30 minutes at least 4 days per week  Activity: No driving, Therapies:  D/c to SNF Return to work: N/A  Activity decreases your risk of heart attack and stroke and makes your heart stronger.  It helps control your weight and blood pressure; helps you relax and can improve your mood.  Participate in a regular exercise program.  Talk with your doctor about the best form of exercise for you (dancing, walking, swimming, cycling).  DIET/WEIGHT Goal is to maintain a healthy weight  Your discharge diet is: Diet Heart Room service appropriate?: Yes; Fluid consistency:: Thin  liquids Your height is:  Height:  (154.9 cm) Your current weight is: Weight: 47.718 kg (105 lb 3.2 oz) Your Body Mass Index (BMI) is:  BMI (Calculated): 19.8  Following the type of diet specifically designed for you will help prevent another stroke.  You are at goal weight.  Your goal Body Mass Index (BMI) is  19-24.  Healthy food habits can help reduce 3 risk factors for stroke:  High cholesterol, hypertension, and excess weight.  RESOURCES Stroke/Support Group:  Call [phone]   STROKE EDUCATION PROVIDED/REVIEWED AND GIVEN TO PATIENT Stroke warning signs and symptoms How to activate emergency medical system (call 911). Medications prescribed at discharge. Need for follow-up after discharge. Personal risk factors for stroke. Pneumonia vaccine given:  Flu vaccine given:  My  questions have been answered, the writing is legible, and I understand these instructions.  I will adhere to these goals & educational materials that have been provided to me after my discharge from the hospital.      My questions have been answered and I understand these instructions. I will adhere to these goals and the provided educational materials after my discharge from the hospital.  Patient/Caregiver Signature _______________________________ Date __________  Clinician Signature _______________________________________ Date __________  Please bring this form and your medication list with you to all your follow-up doctor's appointments.

## 2015-04-08 NOTE — Progress Notes (Signed)
Occupational Therapy Session Note  Patient Details  Name: Leah Figueroa MRN: [id number] Date of Birth: [birth year]  Today's Date: 04/08/2015 OT Individual Time: 0800-0900 OT Individual Time Calculation (min): 60 min    Short Term Goals: Week 2:  OT Short Term Goal 1 (Week 2): STG = LTGs due to remaining LOS  Skilled Therapeutic Interventions/Progress Updates:    ADL retraining with focus on Lt attention, sequencing, and problem solving during bathing and dressing tasks.  Pt demonstrated increased sequencing with bathing in familiar setup of shower, however dropped washcloth multiple times when attempting to incorporate LUE into tasks.  Mod question cues with dressing tasks, also pt demonstrating increased sequencing with recall of hemi-dressing technique and asking appropriate questions with sequencing.  Pt continues to demonstrate decreased problem solving and awareness of errors as well as some perseveration.  Pt continues to require mod verbal cues for safety and sequencing with [place]-care tasks at this time.  Therapy Documentation Precautions:  Precautions Precautions: Fall Precaution Comments: Lt inattention Restrictions Weight Bearing Restrictions: No Other Position/Activity Restrictions: monitor L wrist pain, did not weight bear during eval due to c/o pain General:   Vital Signs: Therapy Vitals Temp: 97.6 F (36.4 C) Temp Source: Oral Pulse Rate: (!) 54 Resp: 17 BP: (!) 127/57 mmHg Patient Position (if appropriate): Lying Oxygen Therapy SpO2: 97 % O2 Device: Not Delivered Pain: Pain Assessment Pain Assessment: No/denies pain ADL: ADL ADL Comments: see FIM Exercises:   Other Treatments:    Function:   Eating Eating               Grooming Oral Care,Brush Teeth, Clean Dentures Activity:      Assist Level: More than reasonable amount of time      Wash, Rinse, Dry Face Activity   Assist Level: More than reasonable time      Wash, Rinse, Dry Hands  Activity   Assist Level: More than reasonable time      Brush, Comb Hair Activity   Assist Level: More than reasonable time    Shave Activity          Apply Makeup Activity   Assist Level: More than reasonable time                                                         Bathing Bathing position   Position: Shower  Bathing parts Body parts bathed by patient: Right arm;Left arm;Chest;Abdomen;Front perineal area;Buttocks;Right upper leg;Left upper leg;Right lower leg;Left lower leg    Bathing assist Assist Level: Set up;Supervision or verbal cues   Set up : To obtain items   Upper Body Dressing/Undressing Upper body dressing   What is the patient wearing?: Bra;Pull over shirt/dress Bra - Perfomed by patient: Thread/unthread right bra strap;Thread/unthread left bra strap;Hook/unhook bra (pull down sports bra)   Pull over shirt/dress - Perfomed by patient: Thread/unthread right sleeve;Thread/unthread left sleeve;Put head through opening;Pull shirt over trunk          Upper body assist Assist Level: Set up;Supervision or verbal cues   Set up : To obtain clothing/put away   Lower Body Dressing/Undressing Lower body dressing   What is the patient wearing?: Underwear;Pants;Socks;Shoes Underwear - Performed by patient: Thread/unthread right underwear leg;Thread/unthread left underwear leg;Pull underwear up/down   Pants- Performed by patient: Thread/unthread right pants  leg;Thread/unthread left pants leg;Pull pants up/down       Socks - Performed by patient: Don/doff right sock;Don/doff left sock   Shoes - Performed by patient: Don/doff right shoe;Don/doff left shoe            Lower body assist Assist Level: Set up;Supervision or verbal cues   Set up : To obtain clothing/put away   Toileting Toileting   Toileting steps completed by patient: Adjust clothing prior to toileting;Performs perineal hygiene;Adjust clothing after toileting   Toileting Assistive Devices:  Grab bar or rail  Toileting assist Assist level: Supervision or verbal cues    Bed Mobility Roll left and right activity      Sit to lying activity      Lying to sitting activity      Mobility details     Transfers Sit to stand transfer   Sit to stand assist level: Supervision or verbal cues Sit to stand assistive device: Armrests;Walker  Chair/bed transfer   Chair/bed transfer method: Ambulatory Chair/bed transfer assist level: Supervision or verbal cues Chair/bed transfer assistive device: Walker   Chair/bed transfer details: Verbal cues for sequencing;Verbal cues for technique  Toilet transfer   Toilet transfer assistive device: Walker;Grab bar       Assist level to toilet: Supervision or verbal cues Assist level from toilet: Supervision or verbal cues  Tub/shower transfer   Tub/shower assistive device: Shower chair;Grab bars;Walk in shower   Assist level into tub: Supervision or verbal cues;Set up only Assist level out of tub: Set up only;Supervision or verbal cues   Cognition Comprehension Comprehension assist level: Follows complex conversation/direction with extra time/assistive device  Expression Expression assist level: Expresses basic needs/ideas: With extra time/assistive device  Social Interaction Social Interaction assist level: Interacts appropriately 90% of the time - Needs monitoring or encouragement for participation or interaction.  Problem Solving Problem solving assist level: Solves basic 50 - 74% of the time/requires cueing 25 - 49% of the time  Memory Memory assist level: Recognizes or recalls 50 - 74% of the time/requires cueing 25 - 49% of the time    Therapy/Group: Individual Therapy  Rosalio Loud 04/08/2015, 9:08 AM

## 2015-04-08 NOTE — Progress Notes (Signed)
Patient left at 1340 via ambulance to [facility]. Patient family left with all belongings. Patient alert and oriented x4. Patient toileted prior to transport. [facility] called and report given to nurse.

## 2015-04-08 NOTE — Progress Notes (Signed)
Social Work Discharge Note Discharge Note  The overall goal for the admission was met for:   Discharge location: No-[facility]-SNF  Length of Stay: Yes-13 DAYS  Discharge activity level: Yes-SUPERVISION/MIN LEVEL  Home/community participation: Yes  Services provided included: MD, RD, PT, OT, SLP, RN, CM, TR, Pharmacy and SW  Financial Services: Medicare and Private Insurance: [place]  Follow-up services arranged: Other: SHORT TERM NHP  Comments (or additional information):DAUGHTER CAN NOT PROVIDE 24 HR CARE-NEEDS PT AT A HIGHER LEVEL  Patient/Family verbalized understanding of follow-up arrangements: Yes  Individual responsible for coordination of the follow-up plan: Sem & [name]-DAUGHTER  Confirmed correct DME delivered: Elease Hashimoto 04/08/2015    Elease Hashimoto

## 2015-04-08 NOTE — Progress Notes (Signed)
Occupational Therapy Discharge Summary  Patient Details  Name: [name] MRN: [id number] Date of Birth: [birth year]  Patient has met 63 of 82 long term goals due to improved activity tolerance, improved balance, postural control, ability to compensate for deficits, functional use of  LEFT upper and LEFT lower extremity, improved attention, improved awareness and improved coordination.  Patient to discharge at overall Supervision level.  Patient's care partner unavailable to provide the necessary cognitive assistance at discharge.    Reasons goals not met: Pt continues to require supervision with toileting tasks and has not demonstrated alternating attention during [place]-care tasks.  Pt benefits from decreased distractions to improve success with use of LUE and dressing tasks.  Recommendation:  Patient will benefit from ongoing skilled OT services in skilled nursing facility setting to continue to advance functional skills in the area of BADL and Reduce care partner burden.  Equipment: No equipment provided  Reasons for discharge: treatment goals met and discharge from hospital  Patient/family agrees with progress made and goals achieved: Yes  OT Discharge Precautions/Restrictions  Precautions Precautions: Fall Precaution Comments: Lt inattention General   Vital Signs Therapy Vitals Temp: 97.6 F (36.4 C) Temp Source: Oral Pulse Rate: (!) 54 Resp: 17 BP: (!) 127/57 mmHg Patient Position (if appropriate): Lying Oxygen Therapy SpO2: 97 % O2 Device: Not Delivered Pain Pain Assessment Pain Assessment: No/denies pain ADL ADL ADL Comments: see FIM Vision/Perception  Vision- History Baseline Vision/History: Wears glasses Wears Glasses: At all times Patient Visual Report: No change from baseline Vision- Assessment Vision Assessment?: Yes Eye Alignment: Within Functional Limits  Cognition Overall Cognitive Status: Impaired/Different from baseline Arousal/Alertness:  Awake/alert Attention: Selective Selective Attention: Impaired Selective Attention Impairment: Functional basic Memory: Impaired Memory Impairment: Decreased recall of new information;Decreased short term memory Decreased Short Term Memory: Verbal basic;Functional basic Awareness: Impaired Awareness Impairment: Intellectual impairment Problem Solving: Impaired Problem Solving Impairment: Verbal basic;Functional basic Executive Function: [place] Monitoring;[place] Correcting;Sequencing;Organizing Sequencing: Impaired Sequencing Impairment: Functional basic Organizing: Impaired Organizing Impairment: Functional basic Decision Making: Impaired Decision Making Impairment: Functional basic;Verbal complex [place] Monitoring: Impaired [place] Monitoring Impairment: Verbal basic;Functional basic [place] Correcting: Impaired [place] Correcting Impairment: Verbal basic;Functional basic Behaviors: Impulsive;Perseveration Safety/Judgment: Impaired Comments: left inattention, impulsivity Sensation Sensation Light Touch: Appears Intact Stereognosis: Not tested Hot/Cold: Appears Intact Proprioception: Impaired by gross assessment Coordination Gross Motor Movements are Fluid and Coordinated: No Fine Motor Movements are Fluid and Coordinated: No Coordination and Movement Description: Impaired FMC of left hand, imparied coordination LUE Extremity/Trunk Assessment RUE Assessment RUE Assessment: Within Functional Limits LUE Assessment LUE Assessment: Within Functional Limits (ROM and strength WFL, impaired sensation and proprioception as well as attention to LUE)     Reilley Latorre, [facility] 04/08/2015, 8:43 AM

## 2015-04-08 NOTE — Progress Notes (Signed)
Subjective/Complaints: No problems overnite Review of Systems - Negative except weakness in Left hand, neg  Left wrist pain , neg SOB, CP or abd pain Objective: Vital Signs: Blood pressure 127/57, pulse 54, temperature 97.6 F (36.4 C), temperature source Oral, resp. rate 17, height 5\' 1"  (1.549 m), weight 47.718 kg (105 lb 3.2 oz), SpO2 97 %. Dg Chest 2 View  04/07/2015   CLINICAL DATA:  Weakness.  Myocardial infarction.  EXAM: CHEST  2 VIEW  COMPARISON:  01/11/2014  FINDINGS: Dense mitral valve calcification. Atherosclerotic calcification of the aortic arch.  Hiatal hernia noted. Thoracic spondylosis with S-shaped thoracolumbar scoliosis.  The lungs appear clear.  Bony demineralization noted.  Borderline cardiomegaly, cardiothoracic index 59% on AP imaging.  IMPRESSION: 1. Densely calcified mitral valve. Aortic atherosclerotic calcification. Borderline cardiomegaly, without edema. No pleural effusion. 2. Thoracolumbar spondylosis and scoliosis.   Electronically Signed   By: Gaylyn Rong M.D.   On: 04/07/2015 13:54   Results for orders placed or performed during the hospital encounter of 03/26/15 (from the past 72 hour(s))  Occult blood card to lab, stool RN will collect     Status: None   Collection Time: 04/06/15  5:50 PM  Result Value Ref Range   Fecal Occult Bld NEGATIVE NEGATIVE  CBC with Differential/Platelet     Status: Abnormal   Collection Time: 04/08/15  6:30 AM  Result Value Ref Range   WBC 4.5 4.0 - 10.5 K/uL   RBC 4.03 3.87 - 5.11 MIL/uL   Hemoglobin 10.2 (L) 12.0 - 15.0 g/dL   HCT 16.1 (L) 09.6 - 04.5 %   MCV 82.1 78.0 - 100.0 fL   MCH 25.3 (L) 26.0 - 34.0 pg   MCHC 30.8 30.0 - 36.0 g/dL   RDW 40.9 (H) 81.1 - 91.4 %   Platelets 273 150 - 400 K/uL   Neutrophils Relative % 58 43 - 77 %   Neutro Abs 2.6 1.7 - 7.7 K/uL   Lymphocytes Relative 29 12 - 46 %   Lymphs Abs 1.3 0.7 - 4.0 K/uL   Monocytes Relative 9 3 - 12 %   Monocytes Absolute 0.4 0.1 - 1.0 K/uL   Eosinophils Relative 3 0 - 5 %   Eosinophils Absolute 0.1 0.0 - 0.7 K/uL   Basophils Relative 1 0 - 1 %   Basophils Absolute 0.0 0.0 - 0.1 K/uL     HEENT: normal Cardio: RRR and no murmur Resp: CTA B/L and unlabored GI: BS positive and NT, ND Extremity:  Pulses positive and No Edema Skin:   Other erythema Left wrist Neuro: Alert, still didn't recognize me after daily visits x 9d, Cranial Nerve II-XII normal, Abnormal Sensory absent LT on Left side UE> LE, Abnormal Motor 3- LUE, 4- LLE, Abnormal FMC Ataxic/ dec FMC and Inattention Musc/Skel: no erythema and swelling left wrist, no pain with ROM, mild tenderness to palpation , no other jts swollen in  in UE or LE Gen NAD   Assessment/Plan: 1. Functional deficits secondary to Right MCA infarct with left hemiparesis and L neglect  Stable for D/C to SNF PMR f/u in 4 weeks Neuro f/u 80mo FIM: Function - Bathing Position: Shower Body parts bathed by patient: Right arm, Left arm, Chest, Abdomen, Front perineal area, Buttocks, Right upper leg, Left upper leg Body parts bathed by helper: Back Assist Level: Touching or steadying assistance(Pt > 75%)  Function- Upper Body Dressing/Undressing What is the patient wearing?: Pull over shirt/dress Bra - Perfomed by patient:  Thread/unthread left bra strap Bra - Perfomed by helper: Thread/unthread right bra strap, Hook/unhook bra (pull down sports bra) Pull over shirt/dress - Perfomed by patient: Pull shirt over trunk, Put head through opening Pull over shirt/dress - Perfomed by helper: Thread/unthread right sleeve, Thread/unthread left sleeve Assist Level: Touching or steadying assistance(Pt > 75%), More than reasonable time, Set up Set up : To obtain clothing/put away Function - Lower Body Dressing/Undressing Lower body dressing/undressing activity did not occur: 2: Max-Patient completed 25-49% of tasks What is the patient wearing?: Socks, Shoes Position: Sitting EOB Underwear - Performed by  patient: Thread/unthread right underwear leg, Thread/unthread left underwear leg, Pull underwear up/down Underwear - Performed by helper: Pull underwear up/down Pants- Performed by patient: Pull pants up/down Pants- Performed by helper: Thread/unthread left pants leg, Thread/unthread right pants leg Socks - Performed by patient: Don/doff right sock, Don/doff left sock Shoes - Performed by patient: Don/doff right shoe, Don/doff left shoe Shoes - Performed by helper: Don/doff left shoe TED Hose - Performed by helper: Don/doff right TED hose, Don/doff left TED hose Assist Level: Supervision or verbal cues (with socks and shoes only) Set up : To obtain clothing/put away, Don/doff TED stockings  Function - Toileting Toileting steps completed by patient: Adjust clothing prior to toileting, Performs perineal hygiene, Adjust clothing after toileting Toileting steps completed by helper: Adjust clothing after toileting Toileting Assistive Devices: Grab bar or rail Assist level: Touching or steadying assistance (Pt.75%)  Function - Toilet Transfers Toilet transfer assistive device: Grab bar Assist level to toilet: Touching or steadying assistance (Pt > 75%) Assist level from toilet: Touching or steadying assistance (Pt > 75%)  Function - Chair/bed transfer Chair/bed transfer method: Ambulatory Chair/bed transfer assist level: Supervision or verbal cues Chair/bed transfer assistive device: Walker Chair/bed transfer details: Verbal cues for sequencing, Verbal cues for technique  Function - Locomotion: Wheelchair Will patient use wheelchair at discharge?: No Function - Locomotion: Ambulation Assistive device: No device Max distance: 150 Assist level: Supervision or verbal cues Assist level: Supervision or verbal cues Assist level: Supervision or verbal cues Assist level: Supervision or verbal cues Walk 10 feet on uneven surfaces activity did not occur: Safety/medical concerns Assist level:  Touching or steadying assistance (Pt > 75%)  Function - Comprehension Comprehension: Auditory Comprehension assistive device: Other (Pt uses hearing aids, but didnot bring to hospital) Comprehension assist level: Follows complex conversation/direction with extra time/assistive device  Function - Expression Expression: Verbal Expression assist level: Expresses basic needs/ideas: With extra time/assistive device  Function - Social Interaction Social Interaction assist level: Interacts appropriately 90% of the time - Needs monitoring or encouragement for participation or interaction.  Function - Problem Solving Problem solving assist level: Solves basic 75 - 89% of the time/requires cueing 10 - 24% of the time  Function - Memory Memory assist level: Recognizes or recalls 50 - 74% of the time/requires cueing 25 - 49% of the time Patient normally able to recall (first 3 days only): That he or she is in a hospital, Current season Medical Problem List and Plan: 1. Functional deficits secondary to Right MCA infarct likely thrombotic, Left hemisensory deficits causing decreased use despite motor gains 2. DVT Prophylaxis/Anticoagulation: Pharmaceutical: Lovenox, no overt signs of bleeding plt nl 3. Pain Management: tylenol prn for HA.no pain issues currently 4. Mood: LCSW to follow for evaluation and support.  5. Neuropsych: This patient is capable of making decisions on her own behalf. 6. Skin/Wound Care: Encourage adequate nutrition and hydration status. Routine pressure relief  measures.  7. Fluids/Electrolytes/Nutrition: Monitor I/O. stable 8. HTN: Monitor BP tid. Continue metoprolol and Norvasc. BPs elevated this am but has been controlled,127/57 this am but 146-163 yest, no pattern to variability, HR low in 50-60s not increasing metoprolol 9. Hypothyroid: Continue supplement.  10.  Left wrist OA + pseudogout,  Acute episode resolved  11.  Anemia- Hgb trending up, Negative stool  guaic  12 Constipation - responding to senna S 2 po BID LOS (Days) 13 A FACE TO FACE EVALUATION WAS PERFORMED  Leah Figueroa E 04/08/2015, 7:10 AM

## 2015-04-09 ENCOUNTER — Encounter: Payer: Self-pay | Admitting: Adult Health

## 2015-04-09 ENCOUNTER — Non-Acute Institutional Stay (SKILLED_NURSING_FACILITY): Payer: Medicare Other | Admitting: Adult Health

## 2015-04-09 DIAGNOSIS — M11232 Other chondrocalcinosis, left wrist: Secondary | ICD-10-CM

## 2015-04-09 DIAGNOSIS — E039 Hypothyroidism, unspecified: Secondary | ICD-10-CM

## 2015-04-09 DIAGNOSIS — R5381 Other malaise: Secondary | ICD-10-CM | POA: Diagnosis not present

## 2015-04-09 DIAGNOSIS — E785 Hyperlipidemia, unspecified: Secondary | ICD-10-CM | POA: Diagnosis not present

## 2015-04-09 DIAGNOSIS — I10 Essential (primary) hypertension: Secondary | ICD-10-CM | POA: Diagnosis not present

## 2015-04-09 DIAGNOSIS — I63311 Cerebral infarction due to thrombosis of right middle cerebral artery: Secondary | ICD-10-CM | POA: Diagnosis not present

## 2015-04-09 DIAGNOSIS — K5900 Constipation, unspecified: Secondary | ICD-10-CM | POA: Diagnosis not present

## 2015-04-09 DIAGNOSIS — M11832 Other specified crystal arthropathies, left wrist: Secondary | ICD-10-CM | POA: Diagnosis not present

## 2015-04-11 ENCOUNTER — Non-Acute Institutional Stay (SKILLED_NURSING_FACILITY): Payer: Medicare Other | Admitting: Internal Medicine

## 2015-04-11 DIAGNOSIS — E46 Unspecified protein-calorie malnutrition: Secondary | ICD-10-CM | POA: Diagnosis not present

## 2015-04-11 DIAGNOSIS — I63311 Cerebral infarction due to thrombosis of right middle cerebral artery: Secondary | ICD-10-CM | POA: Diagnosis not present

## 2015-04-11 DIAGNOSIS — R5381 Other malaise: Secondary | ICD-10-CM

## 2015-04-11 DIAGNOSIS — E039 Hypothyroidism, unspecified: Secondary | ICD-10-CM

## 2015-04-11 DIAGNOSIS — E785 Hyperlipidemia, unspecified: Secondary | ICD-10-CM

## 2015-04-11 DIAGNOSIS — I10 Essential (primary) hypertension: Secondary | ICD-10-CM | POA: Diagnosis not present

## 2015-04-11 DIAGNOSIS — R2681 Unsteadiness on feet: Secondary | ICD-10-CM

## 2015-04-11 DIAGNOSIS — K5900 Constipation, unspecified: Secondary | ICD-10-CM

## 2015-04-14 DIAGNOSIS — I6931 Cognitive deficits following cerebral infarction: Secondary | ICD-10-CM | POA: Diagnosis not present

## 2015-04-14 DIAGNOSIS — R2681 Unsteadiness on feet: Secondary | ICD-10-CM | POA: Diagnosis not present

## 2015-04-14 NOTE — Progress Notes (Signed)
Patient ID: PAMA ROSKOS, female   DOB: Jun 20, 1923, 81 y.o.   MRN: [id number]      [facility]   PCP: No PCP Per Patient  Code Status: full code  No Known Allergies  Chief Complaint  Patient presents with  . New Admit To SNF     HPI:  81 y.o. patient is here for short term rehabilitation post hospital admission and inpatient rehab admission with right middle cerebral artery thrombotic stroke. She received tPA. She underwent inpatient rehabilitation and is here for continuation of her rehab. She had a fall this am with no apparent injury. She has been working with therapy team. She denies any concerns.   Review of Systems:  Constitutional: Negative for fever, chills, diaphoresis.  HENT: Negative for headache, congestion, nasal discharge Eyes: Negative for eye pain, blurred vision, double vision and discharge.  Respiratory: Negative for cough, shortness of breath and wheezing.   Cardiovascular: Negative for chest pain, palpitations, leg swelling.  Gastrointestinal: Negative for heartburn, nausea, vomiting, abdominal pain. Had bowel movement today. Genitourinary: Negative for dysuria Musculoskeletal: Negative for back pain, has left sided weakness Skin: Negative for itching, rash.  Neurological: Negative for dizziness, tingling. Psychiatric/Behavioral: Negative for depression.    Past Medical History  Diagnosis Date  . Other specified personal history presenting hazards to health(V15.89)   . Personal history of arthritis   . Unspecified personal history presenting hazards to health   . Cramp of limb   . Acute myocardial infarction of other anterior wall, episode of care unspecified   . Personal history of malignant neoplasm of breast   . Unspecified essential hypertension   . Personal history of other diseases of digestive system   . Unspecified hypothyroidism   . Other and unspecified hyperlipidemia    Past Surgical History  Procedure  Laterality Date  . Right thyroid lobectomy    . Cataract extraction      left-2006, right 2011  . Intrathoracic goiter resection    . Cholecystectomy    . Lumpectomy for breast cancer  80's    left  . Back surgery     Social History:   reports that she has never smoked. She has never used smokeless tobacco. She reports that she does not drink alcohol or use illicit drugs.  Family History  Problem Relation Age of Onset  . Colon cancer Mother   . Coronary artery disease Brother   . Breast cancer Mother     Medications:   Medication List       This list is accurate as of: 04/11/15 11:59 PM.  Always use your most recent med list.               acetaminophen 325 MG tablet  Commonly known as:  TYLENOL  Take 2 tablets (650 mg total) by mouth every 4 (four) hours as needed for mild pain.     alum & mag hydroxide-simeth 200-200-20 MG/5ML suspension  Commonly known as:  MAALOX/MYLANTA  Take 30 mLs by mouth every 4 (four) hours as needed for indigestion.     amLODipine 2.5 MG tablet  Commonly known as:  NORVASC  Take 2.5 mg by mouth daily.     clopidogrel 75 MG tablet  Commonly known as:  PLAVIX  Take 1 tablet (75 mg total) by mouth daily.     diclofenac sodium 1 % Gel  Commonly known as:  VOLTAREN  Apply 2 g topically 3 (three) times daily. To  left wrist     feeding supplement Liqd  Take 1 Container by mouth 3 (three) times daily between meals.     ketotifen 0.025 % ophthalmic solution  Commonly known as:  ZADITOR  Place 1 drop into both eyes 2 (two) times daily.     levothyroxine 25 MCG tablet  Commonly known as:  SYNTHROID, LEVOTHROID  Take 1 tablet by mouth  daily     metoprolol 50 MG tablet  Commonly known as:  LOPRESSOR  Take 1 tablet by mouth two  times daily     pravastatin 40 MG tablet  Commonly known as:  PRAVACHOL  Take 1 tablet (40 mg total) by mouth daily at 6 PM.     senna-docusate 8.6-50 MG per tablet  Commonly known as:  Senokot-S  Take 2  tablets by mouth 2 (two) times daily.         Physical Exam: Filed Vitals:   04/11/15 2102  BP: 125/63  Pulse: 66  Temp: 98.1 F (36.7 C)  Resp: 16  SpO2: 95%    General- elderly female, in no acute distress Head- normocephalic, atraumatic Nose- normal nasal mucosa, no maxillary or frontal sinus tenderness, no nasal discharge Throat- moist mucus membrane  Eyes- PERRLA, EOMI, no pallor, no icterus, no discharge, normal conjunctiva, normal sclera Neck- no cervical lymphadenopathy Cardiovascular- normal s1,s2, no murmurs, palpable dorsalis pedis and radial pulses, leg edema Respiratory- bilateral clear to auscultation, no wheeze, no rhonchi, no crackles, no use of accessory muscles Abdomen- bowel sounds present, soft, non tender Musculoskeletal- able to move all 4 extremities, left sided weakness present Neurological- no focal deficit, alert and oriented  Skin- warm and dry Psychiatry- normal mood and affect    Labs reviewed: Basic Metabolic Panel:  Recent Labs  40/98/11 0019 03/23/15 0026 03/27/15 0626 03/31/15 0624  NA 139 142 139 137  K 3.8 3.7 3.3* 3.8  CL 106 106 103 102  CO2 24  --  25 25  GLUCOSE 155* 154* 105* 96  BUN 12 16 7 12   CREATININE 0.67 0.60 0.52 0.54  CALCIUM 9.2  --  9.0 9.1   Liver Function Tests:  Recent Labs  03/23/15 0019 03/27/15 0626  AST 29 22  ALT 12* 12*  ALKPHOS 64 54  BILITOT 0.5 0.9  PROT 6.8 6.7  ALBUMIN 3.5 3.0*   No results for input(s): LIPASE, AMYLASE in the last 8760 hours. No results for input(s): AMMONIA in the last 8760 hours. CBC:  Recent Labs  03/23/15 0019  03/27/15 0626 03/31/15 0624 04/08/15 0630  WBC 5.9  --  6.1 4.8 4.5  NEUTROABS 3.7  --  4.0  --  2.6  HGB 9.9*  < > 9.6* 9.8* 10.2*  HCT 32.3*  < > 31.0* 31.7* 33.1*  MCV 80.3  --  79.9 80.7 82.1  PLT 212  --  194 235 273  < > = values in this interval not displayed. Cardiac Enzymes: No results for input(s): CKTOTAL, CKMB, CKMBINDEX, TROPONINI  in the last 8760 hours. BNP: Invalid input(s): POCBNP CBG: No results for input(s): GLUCAP in the last 8760 hours.  Radiological Exams: Dg Wrist 2 Views Left  03/27/2015   CLINICAL DATA:  81 year old female with bruising and redness in the left proximal wrist.  EXAM: LEFT WRIST - 2 VIEW  COMPARISON:  No priors.  FINDINGS: Extensive degenerative calcification is noted adjacent to the distal aspect of the ulna immediately proximal to the proximal carpal row well, likely to reflect  underlying calcium pyrophosphate deposition disease (CPPD) arthropathy. There is multifocal joint space narrowing, subchondral sclerosis, subchondral cyst formation and osteophyte formation, most pronounced at the first [facility] joint, compatible with osteoarthritis. No acute displaced fracture, subluxation or dislocation.  IMPRESSION: 1. Advanced degenerative changes in the left wrist, as above, including a combination of osteoarthritis and advanced CPPD arthropathy. 2. No acute findings.   Electronically Signed   By: Trudie Reed M.D.   On: 03/27/2015 14:17    Assessment/Plan  Physical deconditioning Will have patient work with PT/OT as tolerated to regain strength and restore function.  Fall precautions are in place.  Unsteady gait Will have her work with physical therapy and occupational therapy team to help with gait training and muscle strengthening exercises.fall precautions. Skin care. Encourage to be out of bed. Encouraged to avoid unassisted transfers at present  Thrombotic cva S/p tPA administration, now on plavix.75 mg daily and pravastatin 40 mg daily. Continue to work with therapy team PT and OT for gait training, balance training, and with SLP for cognition and swallowing  HTN Stable bp, continue amlodipine 2.5 mg daily, metoprolol 50 mg bid  Hypothyroidism Continue levothyroxine 25 mcg daily and monitor  Constipation Continue senna s 2 tab bid, stable  HLD Continue pravastatin 40 mg  daily  Protein calorie malnutrtion Encourage po intake, continue feeding supplement, monitor weight. Continue medpass supplement   Goals of care: short term rehabilitation   Labs/tests ordered:cbc, bmp  Family/ staff Communication: reviewed care plan with patient and nursing supervisor    Oneal Grout, MD  [facility] [phone] (Monday-Friday 8 am - 5 pm) [phone] (afterhours)

## 2015-04-16 DIAGNOSIS — R2681 Unsteadiness on feet: Secondary | ICD-10-CM | POA: Diagnosis not present

## 2015-04-16 DIAGNOSIS — I6931 Cognitive deficits following cerebral infarction: Secondary | ICD-10-CM | POA: Diagnosis not present

## 2015-04-18 DIAGNOSIS — R2681 Unsteadiness on feet: Secondary | ICD-10-CM | POA: Diagnosis not present

## 2015-04-18 DIAGNOSIS — I6931 Cognitive deficits following cerebral infarction: Secondary | ICD-10-CM | POA: Diagnosis not present

## 2015-04-23 DIAGNOSIS — I69359 Hemiplegia and hemiparesis following cerebral infarction affecting unspecified side: Secondary | ICD-10-CM | POA: Diagnosis not present

## 2015-04-23 DIAGNOSIS — I10 Essential (primary) hypertension: Secondary | ICD-10-CM | POA: Diagnosis not present

## 2015-04-23 DIAGNOSIS — R252 Cramp and spasm: Secondary | ICD-10-CM | POA: Diagnosis not present

## 2015-04-28 ENCOUNTER — Telehealth: Payer: Self-pay

## 2015-04-28 DIAGNOSIS — I6931 Cognitive deficits following cerebral infarction: Secondary | ICD-10-CM | POA: Diagnosis not present

## 2015-04-28 DIAGNOSIS — R2681 Unsteadiness on feet: Secondary | ICD-10-CM | POA: Diagnosis not present

## 2015-04-28 NOTE — Telephone Encounter (Signed)
Rn call patients cell number and could not leave voice message to change appt to Dr.Sethi on 04-29-15. PT had recent stroke in the hospital.

## 2015-04-28 NOTE — Telephone Encounter (Signed)
Rn call patients home number to get her appt change with Dr.Sethi because of a recent stroke. Rn unable to leave message on home number mailbox is not set up.

## 2015-04-28 NOTE — Telephone Encounter (Signed)
Rn call patients daughter Leah Figueroa about changing appt to Dr.Sethi because of recent stroke weeks ago. Rn offer pts daughter a 200pm, but she stated it was too close to the other appts. Nurse offer 0830 but it was too early. Nurse stated the appt for Dr. Marjory Lies will remain at 1130 and she will just return in 3 mts with the stroke md. Pts daughter Leah Figueroa stated that was fine.

## 2015-04-29 ENCOUNTER — Encounter: Payer: Self-pay | Admitting: Diagnostic Neuroimaging

## 2015-04-29 ENCOUNTER — Ambulatory Visit (INDEPENDENT_AMBULATORY_CARE_PROVIDER_SITE_OTHER): Payer: Medicare Other | Admitting: Diagnostic Neuroimaging

## 2015-04-29 ENCOUNTER — Encounter: Payer: No Typology Code available for payment source | Attending: Physical Medicine & Rehabilitation

## 2015-04-29 ENCOUNTER — Ambulatory Visit (HOSPITAL_BASED_OUTPATIENT_CLINIC_OR_DEPARTMENT_OTHER): Payer: Medicare Other | Admitting: Physical Medicine & Rehabilitation

## 2015-04-29 ENCOUNTER — Encounter: Payer: Self-pay | Admitting: Physical Medicine & Rehabilitation

## 2015-04-29 VITALS — BP 135/66 | HR 70

## 2015-04-29 VITALS — BP 127/75 | HR 61 | Ht 60.0 in | Wt 104.8 lb

## 2015-04-29 DIAGNOSIS — I639 Cerebral infarction, unspecified: Secondary | ICD-10-CM | POA: Diagnosis not present

## 2015-04-29 DIAGNOSIS — I6350 Cerebral infarction due to unspecified occlusion or stenosis of unspecified cerebral artery: Secondary | ICD-10-CM | POA: Diagnosis not present

## 2015-04-29 DIAGNOSIS — I6931 Cognitive deficits following cerebral infarction: Secondary | ICD-10-CM

## 2015-04-29 DIAGNOSIS — I10 Essential (primary) hypertension: Secondary | ICD-10-CM | POA: Diagnosis not present

## 2015-04-29 DIAGNOSIS — E785 Hyperlipidemia, unspecified: Secondary | ICD-10-CM | POA: Insufficient documentation

## 2015-04-29 DIAGNOSIS — G8190 Hemiplegia, unspecified affecting unspecified side: Secondary | ICD-10-CM

## 2015-04-29 DIAGNOSIS — R208 Other disturbances of skin sensation: Secondary | ICD-10-CM

## 2015-04-29 DIAGNOSIS — I69319 Unspecified symptoms and signs involving cognitive functions following cerebral infarction: Secondary | ICD-10-CM

## 2015-04-29 DIAGNOSIS — Z8673 Personal history of transient ischemic attack (TIA), and cerebral infarction without residual deficits: Secondary | ICD-10-CM | POA: Diagnosis not present

## 2015-04-29 DIAGNOSIS — I69398 Other sequelae of cerebral infarction: Secondary | ICD-10-CM | POA: Diagnosis not present

## 2015-04-29 DIAGNOSIS — IMO0002 Reserved for concepts with insufficient information to code with codable children: Secondary | ICD-10-CM | POA: Insufficient documentation

## 2015-04-29 DIAGNOSIS — G8194 Hemiplegia, unspecified affecting left nondominant side: Secondary | ICD-10-CM

## 2015-04-29 DIAGNOSIS — I6930 Unspecified sequelae of cerebral infarction: Secondary | ICD-10-CM

## 2015-04-29 NOTE — Progress Notes (Signed)
[facility]  PATIENT: Leah Figueroa DOB: 23-Sep-1922  REFERRING CLINICIAN: S Biby / stroke follow up  HISTORY FROM: patient, daughter, PT from [facility] REASON FOR VISIT: follow up   HISTORICAL  CHIEF COMPLAINT:  Chief Complaint  Patient presents with  . Cerebrovascular Accident    rm 8, daughter -   . Follow-up    hospital FU    HISTORY OF PRESENT ILLNESS:   81 year old female here for evaluation and follow-up from stroke in August 2016. Patient had onset of leaning to the left side, falling to the left, taken to the hospital via EMS code stroke was activated. Patient was evaluated and treated with IV TPA. She was admitted to the hospital for further evaluation. Patient was found to have right MCA ischemic infarction with right pontine ischemic infarction. Carotid Doppler showed no significant stenosis. Echocardiogram showed no cardioembolic source of emboli. LDL was 94 and hemoglobin Z6X was 5.8. Patient was on aspirin 81 mg daily and this was changed to Plavix. Patient went to inpatient rehabilitation, now at [facility] for skilled nursing rehabilitation. Patient planning to be discharged within next week or 2.  Since discharge patient is doing well. No new symptoms, headaches, pain or other problems. She has made slow steady improvement over the last month.   REVIEW OF SYSTEMS: Full 14 system review of systems performed and notable only for only as per history of present illness. Otherwise negative.  ALLERGIES: No Known Allergies  HOME MEDICATIONS: Outpatient Prescriptions Prior to Visit  Medication Sig Dispense Refill  . acetaminophen (TYLENOL) 325 MG tablet Take 2 tablets (650 mg total) by mouth every 4 (four) hours as needed for mild pain.    Marland Kitchen alum & mag hydroxide-simeth (MAALOX/MYLANTA) 200-200-20 MG/5ML suspension Take 30 mLs by mouth every 4 (four) hours as needed for indigestion. 355 mL 0  . amLODipine (NORVASC) 2.5 MG tablet Take 2.5 mg by  mouth daily.    . clopidogrel (PLAVIX) 75 MG tablet Take 1 tablet (75 mg total) by mouth daily.    . diclofenac sodium (VOLTAREN) 1 % GEL Apply 2 g topically 3 (three) times daily. To left wrist    . feeding supplement (BOOST / RESOURCE BREEZE) LIQD Take 1 Container by mouth 3 (three) times daily between meals.  0  . ketotifen (ZADITOR) 0.025 % ophthalmic solution Place 1 drop into both eyes 2 (two) times daily. 5 mL 0  . levothyroxine (SYNTHROID, LEVOTHROID) 25 MCG tablet Take 1 tablet by mouth  daily 90 tablet 0  . metoprolol (LOPRESSOR) 50 MG tablet Take 1 tablet by mouth two  times daily 180 tablet 0  . pravastatin (PRAVACHOL) 40 MG tablet Take 1 tablet (40 mg total) by mouth daily at 6 PM.    . senna-docusate (SENOKOT-S) 8.6-50 MG per tablet Take 2 tablets by mouth 2 (two) times daily.     No facility-administered medications prior to visit.    PAST MEDICAL HISTORY: Past Medical History  Diagnosis Date  . Other specified personal history presenting hazards to health(V15.89)   . Personal history of arthritis   . Unspecified personal history presenting hazards to health   . Cramp of limb   . Acute myocardial infarction of other anterior wall, episode of care unspecified   . Personal history of malignant neoplasm of breast   . Unspecified essential hypertension   . Personal history of other diseases of digestive system   . Unspecified hypothyroidism   . Other and unspecified hyperlipidemia   .  Stroke 03/2015    "left sided issues"    PAST SURGICAL HISTORY: Past Surgical History  Procedure Laterality Date  . Right thyroid lobectomy    . Cataract extraction      left-2006, right 2011  . Intrathoracic goiter resection    . Cholecystectomy    . Lumpectomy for breast cancer Left 80's    radiation therapy  . Back surgery      FAMILY HISTORY: Family History  Problem Relation Age of Onset  . Colon cancer Mother   . Coronary artery disease Brother   . Breast cancer Mother      SOCIAL HISTORY:  Social History   Social History  . Marital Status: Widowed    Spouse Name: N/A  . Number of Children: 1  . Years of Education: 11   Occupational History  . retired    Social History Main Topics  . Smoking status: Never Smoker   . Smokeless tobacco: Never Used  . Alcohol Use: No  . Drug Use: No  . Sexual Activity: No   Other Topics Concern  . Not on file   Social History Narrative   HSG. Married '48- 57 yrs/widowed. 1 daughter - '59; 1 grandchild. 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  Lives alone. I-ADLs, manages books, pays bills.. End-of-Life Care: no heroic measures; No CPR, no mechanical ventilation.  Caffeine use/day:  None. Does Patient Exercise:  No       04/29/15 currently resident at [facility] rehab        PHYSICAL EXAM  GENERAL EXAM/CONSTITUTIONAL: Vitals:  Filed Vitals:   04/29/15 1139  BP: 127/75  Pulse: 61  Height: 5' (1.524 m)  Weight: 104 lb 12.8 oz (47.537 kg)     Body mass index is 20.47 kg/(m^2).  Vision Screening Comments: Unable to complete, doesn't have glasses with her  Patient is in no distress; well developed, nourished and groomed; neck is supple  CARDIOVASCULAR:  Examination of carotid arteries is normal; no carotid bruits  Regular rate and rhythm, no murmurs  Examination of peripheral vascular system by observation and palpation is normal  EYES:  Ophthalmoscopic exam of optic discs and posterior segments is normal; no papilledema or hemorrhages  MUSCULOSKELETAL:  Gait, strength, tone, movements noted in Neurologic exam below  NEUROLOGIC: MENTAL STATUS:  No flowsheet data found.  awake, alert, oriented to person, place and time  recent and remote memory intact  normal attention and concentration  language fluent, comprehension intact, naming intact,   fund of knowledge appropriate  CRANIAL NERVE:   2nd - no papilledema on fundoscopic exam  2nd, 3rd, 4th, 6th - pupils equal and  reactive to light, visual fields full to confrontation, extraocular muscles intact, no nystagmus  5th - facial sensation symmetric  7th - facial strength symmetric  8th - hearing DECR  9th - palate elevates symmetrically, uvula midline  11th - shoulder shrug symmetric  12th - tongue protrusion midline  MOTOR:   normal bulk and tone, full strength in the BUE, BLE; SLIGHTLY DECR IN LEFT ARM  SENSORY:   normal and symmetric to light touch, temperature; ABSENT VIB AT TOES AND ANKLES  COORDINATION:   finger-nose-finger, fine finger movements SLOW ON LEFT SIDE  REFLEXES:   deep tendon reflexes TRACE and symmetric  GAIT/STATION:   UNSTEADY GAIT; NEEDS SOME ASSISTANCE TO STAND    DIAGNOSTIC DATA (LABS, IMAGING, TESTING) - I reviewed patient records, labs, notes, testing and imaging myself where available.  Lab Results  Component Value  Date   WBC 4.5 04/08/2015   HGB 10.2* 04/08/2015   HCT 33.1* 04/08/2015   MCV 82.1 04/08/2015   PLT 273 04/08/2015      Component Value Date/Time   NA 137 03/31/2015 0624   K 3.8 03/31/2015 0624   CL 102 03/31/2015 0624   CO2 25 03/31/2015 0624   GLUCOSE 96 03/31/2015 0624   BUN 12 03/31/2015 0624   CREATININE 0.54 03/31/2015 0624   CALCIUM 9.1 03/31/2015 0624   PROT 6.7 03/27/2015 0626   ALBUMIN 3.0* 03/27/2015 0626   AST 22 03/27/2015 0626   ALT 12* 03/27/2015 0626   ALKPHOS 54 03/27/2015 0626   BILITOT 0.9 03/27/2015 0626   GFRNONAA >60 03/31/2015 0624   GFRAA >60 03/31/2015 0624   Lab Results  Component Value Date   CHOL 152 03/23/2015   HDL 47 03/23/2015   LDLCALC 94 03/23/2015   TRIG 54 03/23/2015   CHOLHDL 3.2 03/23/2015   Lab Results  Component Value Date   HGBA1C 5.8* 03/23/2015   No results found for: [id number] Lab Results  Component Value Date   TSH 1.29 07/24/2013    03/23/15 MRI brain [I reviewed images myself and agree with interpretation. -VRP]  - Acute RIGHT frontoparietal lobe infarct, MCA  territory. Subcentimeter acute RIGHT pontine infarct. - Severe white matter changes compatible with chronic small vessel ischemic disease. Scattered susceptibility artifacts suggest sequelae of chronic hypertension without lobar hematoma.   03/23/15 MRA head [I reviewed images myself and agree with interpretation. -VRP]  - No acute large vessel occlusion.  - Dolicoectatic intracranial vessels compatible chronic hypertension with superimposed moderate luminal irregularity commonly seen with atherosclerosis. - Focal high-grade stenosis LEFT A2 segment.  03/24/15 carotid u/s  - No significant extracranial carotid artery stenosis demonstrated 1-39%.  - Vertebrals are patent with antegrade flow.  03/24/15 TTE  - Left ventricle: The cavity size was normal. There was moderateconcentric hypertrophy. Systolic function was normal. Theestimated ejection fraction was in the range of 60% to 65%. Thereis akinesis of the apical myocardium. Doppler parameters areconsistent with abnormal left ventricular relaxation (grade 1diastolic dysfunction). - Mitral valve: Moderately calcified annulus. - No cardiac source of emboli was identified.     ASSESSMENT AND PLAN  81 y.o. year old female here with right MCA and right pontine ischemic infarctions in early August 2016, status post IV TPA. Patient is doing well with mild gradual improvement in symptoms. Suspect embolic etiology of strokes. Due to advanced age, further cardioembolic workup was deferred that has transesophageal echocardiogram or outpatient cardiac monitoring. Agree with medical management with Plavix, statin, blood pressure control.  Dx:  Chronic ischemic right MCA stroke  Right pontine stroke     PLAN: - continue plavix - continue stroke risk factor reduction per PCP  Return in about 3 months (around 07/29/2015) for WITH DR. Pearlean Brownie IN STROKE CLINIC.    Suanne Marker, MD 04/29/2015, 12:15 PM Certified in Neurology,  Neurophysiology and Neuroimaging  [facility] [address], [address] [place], [place] [address] [phone]

## 2015-04-29 NOTE — Progress Notes (Signed)
Subjective:    Patient ID: Leah Figueroa, female    DOB: [birth year], 81 y.o.   MRN: [id number]  HPI 81 y.o. female with history of HTN, HA, who was at home with family when she started to lean to the left then fell with attempts at waking. She was evaluated in ED on 08/06 and developed left sided weakness after presentation and received t-PA. MRI/MRA brain done revealing  Right MCA infarct affecting frontoparietal lobe, subcentimeter right pontine infarct, severe white mater changes due to SVD and focal high grade stenosis left A2 segment. 2D echo with EF 60-65% with apical akinesis, moderate concentric LVH and moderately calcified MV.   Dr. Pearlean Brownie recommended ASA for R-MCA infarct  Admit date: 03/26/2015 Discharge date: 04/08/2015  Patient is now at [facility]. Has been there since discharge from [facility]. Has followed up with her primary care physician as well as her neurologist. Continues on Plavix for CVA prophylaxis. Receiving PT OT and speech therapy at [facility]. Had a physical medicine and rehabilitation consult at [facility]. Patient had one fall on August 28, had rib pain but x-rays were negative for fracture. No other falls. Appetite has been good. She was having excess stools but this has improved after reducing laxatives.  Pain Inventory Average Pain 0 Pain Right Now 0 My pain is no pain  In the last 24 hours, has pain interfered with the following? General activity 0 Relation with others 0 Enjoyment of life 0 What TIME of day is your pain at its worst? no pain Sleep (in general) Fair  Pain is worse with: no pain Pain improves with: no pain Relief from Meds: no pain  Mobility walk with assistance ability to climb steps?  yes do you drive?  no needs help with transfers Do you have any goals in this area?  yes  Function not employed: date last employed . I need assistance with the following:  feeding, dressing, bathing, toileting, meal prep, household  duties and shopping  Neuro/Psych trouble walking confusion anxiety  Prior Studies hospital f/u  Physicians involved in your care hospital f/u   Family History  Problem Relation Age of Onset  . Colon cancer Mother   . Coronary artery disease Brother   . Breast cancer Mother    Social History   Social History  . Marital Status: Widowed    Spouse Name: N/A  . Number of Children: 1  . Years of Education: 11   Occupational History  . retired    Social History Main Topics  . Smoking status: Never Smoker   . Smokeless tobacco: Never Used  . Alcohol Use: No  . Drug Use: No  . Sexual Activity: No   Other Topics Concern  . None   Social History Narrative   HSG. Married '48- 57 yrs/widowed. 1 daughter - '59; 1 grandchild. 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  Lives alone. I-ADLs, manages books, pays bills.. End-of-Life Care: no heroic measures; No CPR, no mechanical ventilation.  Caffeine use/day:  None. Does Patient Exercise:  No       04/29/15 currently resident at [facility] rehab      Past Surgical History  Procedure Laterality Date  . Right thyroid lobectomy    . Cataract extraction      left-2006, right 2011  . Intrathoracic goiter resection    . Cholecystectomy    . Lumpectomy for breast cancer Left 80's    radiation therapy  . Back surgery  Past Medical History  Diagnosis Date  . Other specified personal history presenting hazards to health(V15.89)   . Personal history of arthritis   . Unspecified personal history presenting hazards to health   . Cramp of limb   . Acute myocardial infarction of other anterior wall, episode of care unspecified   . Personal history of malignant neoplasm of breast   . Unspecified essential hypertension   . Personal history of other diseases of digestive system   . Unspecified hypothyroidism   . Other and unspecified hyperlipidemia   . Stroke 03/2015    "left sided issues"   BP 135/66 mmHg  Pulse 70  SpO2  95%  Opioid Risk Score:   Fall Risk Score:  `1  Depression screen PHQ 2/9  Depression screen PHQ 2/9 04/29/2015  Decreased Interest 2  Down, Depressed, Hopeless 3  PHQ - 2 Score 5  Altered sleeping 2  Tired, decreased energy 0  Change in appetite 3  Feeling bad or failure about yourself  2  Trouble concentrating 0  Moving slowly or fidgety/restless 0  Suicidal thoughts 0  PHQ-9 Score 12  Difficult doing work/chores Not difficult at all      Review of Systems  Constitutional:       Weight loss Poor appetite  Musculoskeletal: Positive for gait problem.  Psychiatric/Behavioral: Positive for confusion. The patient is nervous/anxious.   All other systems reviewed and are negative.      Objective:   Physical Exam  Constitutional: She is oriented to person, place, and time. She appears well-developed and well-nourished.  HENT:  Head: Normocephalic and atraumatic.  Eyes: Conjunctivae and EOM are normal. Pupils are equal, round, and reactive to light.  Neck: Normal range of motion.  Cardiovascular: Normal rate, regular rhythm and normal heart sounds.   Neurological: She is alert and oriented to person, place, and time. She displays no atrophy. A sensory deficit is present.  Motor strength is 5/5 in the right deltoid, biceps, triceps, grip, hip flexor, knee extensor, ankle dorsi flexor  4+ in the left deltoid, biceps, triceps, grip, hip flexor, knee extensor, ankle dorsiflexor  Decreased light touch sensation in the left upper and left lower extremity decreased proprioception.  Psychiatric: She has a normal mood and affect.  Nursing note and vitals reviewed.   Mini-Mental Status exam 22/30 Main deficits with MMSE R  Attention/calculation, Copying diagrams,  Visual perceptual      Assessment & Plan:  1. 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 Visual perceptual deficits are  improving but still present.Still needs 24 7 supervision No driving Patient will follow up with neurology in about 3 months as per their office Recommend home health therapy after discharge from SNF Follow up in 1 month with me at which point we will likely need to write orders for outpatient therapy with PT and OT as well as speech therapy.  2. Left wrist pain pseudogout improved no recurrence  Discussed recommendations and findings with the patient as well as her daughter. Went over prognosis for functional improvement Over half of the 25 min visit was spent counseling and coordinating care.

## 2015-04-29 NOTE — Patient Instructions (Signed)
Recommend home health PT OT and speech following discharge from SNF  I will see the patient back one month after discharge from SNF  Will likely need outpatient therapy once home health therapy is finished  Recommend 24 7 supervision, no driving

## 2015-04-29 NOTE — Patient Instructions (Signed)
Continue current medications. 

## 2015-04-30 DIAGNOSIS — R2681 Unsteadiness on feet: Secondary | ICD-10-CM | POA: Diagnosis not present

## 2015-04-30 DIAGNOSIS — I6931 Cognitive deficits following cerebral infarction: Secondary | ICD-10-CM | POA: Diagnosis not present

## 2015-05-02 DIAGNOSIS — I6931 Cognitive deficits following cerebral infarction: Secondary | ICD-10-CM | POA: Diagnosis not present

## 2015-05-02 DIAGNOSIS — R2681 Unsteadiness on feet: Secondary | ICD-10-CM | POA: Diagnosis not present

## 2015-05-05 ENCOUNTER — Ambulatory Visit: Payer: PRIVATE HEALTH INSURANCE

## 2015-05-05 ENCOUNTER — Inpatient Hospital Stay: Payer: PRIVATE HEALTH INSURANCE | Admitting: Physical Medicine & Rehabilitation

## 2015-05-05 DIAGNOSIS — R2681 Unsteadiness on feet: Secondary | ICD-10-CM | POA: Diagnosis not present

## 2015-05-05 DIAGNOSIS — I6931 Cognitive deficits following cerebral infarction: Secondary | ICD-10-CM | POA: Diagnosis not present

## 2015-05-07 ENCOUNTER — Encounter: Payer: Self-pay | Admitting: Adult Health

## 2015-05-07 ENCOUNTER — Non-Acute Institutional Stay (SKILLED_NURSING_FACILITY): Payer: Medicare Other | Admitting: Adult Health

## 2015-05-07 DIAGNOSIS — E039 Hypothyroidism, unspecified: Secondary | ICD-10-CM | POA: Diagnosis not present

## 2015-05-07 DIAGNOSIS — M11232 Other chondrocalcinosis, left wrist: Secondary | ICD-10-CM

## 2015-05-07 DIAGNOSIS — I10 Essential (primary) hypertension: Secondary | ICD-10-CM

## 2015-05-07 DIAGNOSIS — I63311 Cerebral infarction due to thrombosis of right middle cerebral artery: Secondary | ICD-10-CM

## 2015-05-07 DIAGNOSIS — R5381 Other malaise: Secondary | ICD-10-CM

## 2015-05-07 DIAGNOSIS — E785 Hyperlipidemia, unspecified: Secondary | ICD-10-CM | POA: Diagnosis not present

## 2015-05-07 DIAGNOSIS — K5900 Constipation, unspecified: Secondary | ICD-10-CM

## 2015-05-07 DIAGNOSIS — M11832 Other specified crystal arthropathies, left wrist: Secondary | ICD-10-CM | POA: Diagnosis not present

## 2015-05-07 NOTE — Progress Notes (Signed)
Patient ID: Leah Figueroa, female   DOB: December 03, 1922, 81 y.o.   MRN: [id number]    DATE:  05/07/15 MRN:  [id number]  BIRTHDAY: [birth year]  Facility:  Nursing Home Location:  [facility]  Nursing Home Room Number: 1205-P  LEVEL OF CARE:  SNF [phone])  Contact Information    Name Relation Home Work [place] Daughter [phone]  [phone]   Pegram,[name] Relative   [phone]      Chief Complaint  Patient presents with  . Discharge Note    Physical deconditioning, thrombotic stroke involving the right middle cerebral artery, hypertension, pseudogout of left wrist, hypothyroidism, hyperlipidemia and constipation    HISTORY OF PRESENT ILLNESS:  This is a 81 year old female who is for discharge home with Home health PT, OT and Home health aide. DME:  Rollator walker.  She has been admitted to [facility] on 04/08/15 from [facility]. She has PMH of hypertension and headache. She was at home with family when she started to lean to the left then fell with attempts at walking. She was brought to ED worrying she developed left sided weakness and received TPA. MRI/MRA brain done revealing right MCA infarct affecting frontal parietal lobe, subcentimeter right pontine infarct, severe white matter changes due to SVD and focal high-grade stenosis left A2 segment. 2-D echo with EF 60-65% with apical akinesis and moderate concentric LVH and moderately calcified MV. Dr. Pearlean Brownie recommended aspirin for right MCA infarct of unknown etiology. Patient with resultant left-sided weakness with sensory deficits, left visual field deficits, left inattention as well as mild cognitive deficits.  She was admitted to rehabilitation on 03/26/15 for inpatient therapy. Patient is still needing further therapies, supervision. Therefore, she has been admitted to [facility].  Patient was admitted to this facility for short-term rehabilitation after the patient's recent hospitalization.   Patient has completed SNF rehabilitation and therapy has cleared the patient for discharge.   PAST MEDICAL HISTORY:  Past Medical History  Diagnosis Date  . Other specified personal history presenting hazards to health(V15.89)   . Personal history of arthritis   . Unspecified personal history presenting hazards to health   . Cramp of limb   . Acute myocardial infarction of other anterior wall, episode of care unspecified   . Personal history of malignant neoplasm of breast   . Unspecified essential hypertension   . Personal history of other diseases of digestive system   . Unspecified hypothyroidism   . Other and unspecified hyperlipidemia   . Stroke 03/2015    "left sided issues"     CURRENT MEDICATIONS: Reviewed  Patient's Medications  New Prescriptions   No medications on file  Previous Medications   ACETAMINOPHEN (TYLENOL) 325 MG TABLET    Take 2 tablets (650 mg total) by mouth every 4 (four) hours as needed for mild pain.   ALUM & MAG HYDROXIDE-SIMETH (MAALOX/MYLANTA) 200-200-20 MG/5ML SUSPENSION    Take 30 mLs by mouth every 4 (four) hours as needed for indigestion.   AMLODIPINE (NORVASC) 2.5 MG TABLET    Take 2.5 mg by mouth daily.   ATORVASTATIN (LIPITOR) 10 MG TABLET    Take 10 mg by mouth daily.   CLOPIDOGREL (PLAVIX) 75 MG TABLET    Take 1 tablet (75 mg total) by mouth daily.   DICLOFENAC SODIUM (VOLTAREN) 1 % GEL    Apply 2 g topically 3 (three) times daily. To left wrist   DOCUSATE SODIUM (COLACE) 100 MG CAPSULE  Take 100 mg by mouth daily.   FEEDING SUPPLEMENT (BOOST / RESOURCE BREEZE) LIQD    Take 1 Container by mouth 3 (three) times daily between meals.   KETOTIFEN (ZADITOR) 0.025 % OPHTHALMIC SOLUTION    Place 1 drop into both eyes 2 (two) times daily.   LEVOTHYROXINE (SYNTHROID, LEVOTHROID) 25 MCG TABLET    Take 1 tablet by mouth  daily   METOPROLOL (LOPRESSOR) 50 MG TABLET    Take 1 tablet by mouth two  times daily  Modified Medications   No medications on  file  Discontinued Medications   PRAVASTATIN (PRAVACHOL) 40 MG TABLET    Take 1 tablet (40 mg total) by mouth daily at 6 PM.   SENNA-DOCUSATE (SENOKOT-S) 8.6-50 MG PER TABLET    Take 2 tablets by mouth 2 (two) times daily.    No Known Allergies   REVIEW OF SYSTEMS:  GENERAL: no change in appetite, no fatigue, no weight changes, no fever, chills or weakness EYES: Denies change in vision, dry eyes, eye pain, itching or discharge EARS: Denies change in hearing, ringing in ears, or earache NOSE: Denies nasal congestion or epistaxis MOUTH and THROAT: Denies oral discomfort, gingival pain or bleeding, pain from teeth or hoarseness   RESPIRATORY: no cough, SOB, DOE, wheezing, hemoptysis CARDIAC: no chest pain, edema or palpitations GI: no abdominal pain, diarrhea, constipation, heart burn, nausea or vomiting GU: Denies dysuria, frequency, hematuria, incontinence, or discharge PSYCHIATRIC: Denies feeling of depression or anxiety. No report of hallucinations, insomnia, paranoia, or agitation   PHYSICAL EXAMINATION  GENERAL APPEARANCE: Well nourished. In no acute distress. Normal body habitus HEAD: Normal in size and contour. No evidence of trauma EYES: Lids open and close normally. No blepharitis, entropion or ectropion. PERRL. Conjunctivae are clear and sclerae are white. Lenses are without opacity EARS: Pinnae are normal. Patient hears normal voice tunes of the examiner MOUTH and THROAT: Lips are without lesions. Oral mucosa is moist and without lesions. Tongue is normal in shape, size, and color and without lesions NECK: supple, trachea midline, no neck masses, no thyroid tenderness, no thyromegaly LYMPHATICS: no LAN in the neck, no supraclavicular LAN RESPIRATORY: breathing is even & unlabored, BS CTAB CARDIAC: RRR, no murmur,no extra heart sounds, no edema GI: abdomen soft, normal BS, no masses, no tenderness, no hepatomegaly, no splenomegaly EXTREMITIES:  Able to move 4 extremities;  LUE slight weakness PSYCHIATRIC: Alert and oriented X 3. Affect and behavior are appropriate  LABS/RADIOLOGY: Labs reviewed: 04/14/15  sodium 145 potassium 3.9 glucose 81 BUN 18 creatinine 0.52 calcium 8.8 total protein 6.1 albumin 3.38 total bilirubin 0.37 alkaline phosphatase 58 SGOT 18  SGPT 10 04/13/15 x-ray of left RIBS shows no evidence of acute osseous pathology 04/11/15  WBC 4.8 hemoglobin 9.9 hematocrit 32.5 MCV 83.8 crit at 291 Basic Metabolic Panel:  Recent Labs  16/10/96 0019 03/23/15 0026 03/27/15 0626 03/31/15 0624  NA 139 142 139 137  K 3.8 3.7 3.3* 3.8  CL 106 106 103 102  CO2 24  --  25 25  GLUCOSE 155* 154* 105* 96  BUN CREATININE 0.67 0.60 0.52 0.54  CALCIUM 9.2  --  9.0 9.1   Liver Function Tests:  Recent Labs  03/23/15 0019 03/27/15 0626  AST 29 22  ALT 12* 12*  ALKPHOS 64 54  BILITOT 0.5 0.9  PROT 6.8 6.7  ALBUMIN 3.5 3.0*    CBC:  Recent Labs  03/23/15 0019  03/27/15 0626 03/31/15 0624 04/08/15  0630  WBC 5.9  --  6.1 4.8 4.5  NEUTROABS 3.7  --  4.0  --  2.6  HGB 9.9*  < > 9.6* 9.8* 10.2*  HCT 32.3*  < > 31.0* 31.7* 33.1*  MCV 80.3  --  79.9 80.7 82.1  PLT 212  --  194 235 273  < > = values in this interval not displayed.  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 Pearlean Brownie, neurology, and 4-6 weeks  Hypertension - well controlled; continue Norvasc 2.5 mg 1 tab by mouth daily and metoprolol 50 mg 1 tab by mouth twice a day  Pseudogout of left wrist   - continue Voltaren gel 1% apply 2 g topically to left wrist 3 times a day   Hypothyroidism - continue Synthroid 25 g 1 tab by mouth daily  Hyperlipidemia - continue Lipitor 10 mg 1 tab by mouth daily  Constipation - continue Colace 100 mg 1  capsule by mouth daily     I have filled out patient's discharge paperwork and written prescriptions.  Patient will receive home health PT, OT and Home health aide.  DME provided:  Rollator  walker  Total discharge time: Greater than 30 minutes  Discharge time involved coordination of the discharge process with Child psychotherapist, nursing staff and therapy department. Medical justification for home health services/DME verified.      [facility], NP [employer] [phone]

## 2015-05-07 NOTE — Progress Notes (Signed)
Patient ID: Leah Figueroa, female   DOB: 1923/07/09, 81 y.o.   MRN: [id number]    DATE:  04/09/15 MRN:  [id number]  BIRTHDAY: Mar 28, 1923  Facility:  Nursing Home Location:  [facility]  Nursing Home Room Number: 1205-P  LEVEL OF CARE:  SNF [phone])  Contact Information    Name Relation Home Work [place] Daughter [phone]  [phone]   Pegram,[name] Relative   [phone]      Chief Complaint  Patient presents with  . Hospitalization Follow-up    Physical deconditioning, thrombotic stroke involving the right middle cerebral artery, hypertension, pseudogout of left wrist, hypothyroidism, hyperlipidemia and constipation    HISTORY OF PRESENT ILLNESS:  This is a 81 year old female who has been admitted to [facility] on 04/08/15 from [facility]. She has PMH of hypertension and headache. She was at home with family when she started to lean to the left then fell with attempts at walking. She was brought to ED worrying she developed left sided weakness and received TPA. MRI/MRA brain done revealing right MCA infarct affecting frontal parietal lobe, subcentimeter right pontine infarct, severe white matter changes due to SVD and focal high-grade stenosis left A2 segment. 2-D echo with EF 60-65% with apical akinesis and moderate concentric LVH and moderately calcified MV. Dr. Pearlean Brownie recommended aspirin for right MCA infarct of unknown etiology. Patient with resultant left-sided weakness with sensory deficits, left visual field deficits, left inattention as well as mild cognitive deficits.  She was admitted to rehabilitation on 03/26/15 for inpatient therapy. Patient is still needing further therapies, supervision. Therefore, she has been admitted to [facility].  PAST MEDICAL HISTORY:  Past Medical History  Diagnosis Date  . Other specified personal history presenting hazards to health(V15.89)   . Personal history of arthritis   . Unspecified personal  history presenting hazards to health   . Cramp of limb   . Acute myocardial infarction of other anterior wall, episode of care unspecified   . Personal history of malignant neoplasm of breast   . Unspecified essential hypertension   . Personal history of other diseases of digestive system   . Unspecified hypothyroidism   . Other and unspecified hyperlipidemia   . Stroke 03/2015    "left sided issues"     CURRENT MEDICATIONS: Reviewed  Patient's Medications  New Prescriptions   No medications on file  Previous Medications   ACETAMINOPHEN (TYLENOL) 325 MG TABLET    Take 2 tablets (650 mg total) by mouth every 4 (four) hours as needed for mild pain.   ALUM & MAG HYDROXIDE-SIMETH (MAALOX/MYLANTA) 200-200-20 MG/5ML SUSPENSION    Take 30 mLs by mouth every 4 (four) hours as needed for indigestion.   AMLODIPINE (NORVASC) 2.5 MG TABLET    Take 2.5 mg by mouth daily.   CLOPIDOGREL (PLAVIX) 75 MG TABLET    Take 1 tablet (75 mg total) by mouth daily.   DICLOFENAC SODIUM (VOLTAREN) 1 % GEL    Apply 2 g topically 3 (three) times daily. To left wrist   FEEDING SUPPLEMENT (BOOST / RESOURCE BREEZE) LIQD    Take 1 Container by mouth 3 (three) times daily between meals.   KETOTIFEN (ZADITOR) 0.025 % OPHTHALMIC SOLUTION    Place 1 drop into both eyes 2 (two) times daily.   LEVOTHYROXINE (SYNTHROID, LEVOTHROID) 25 MCG TABLET    Take 1 tablet by mouth  daily   METOPROLOL (LOPRESSOR) 50 MG TABLET    Take 1 tablet  by mouth two  times daily   PRAVASTATIN (PRAVACHOL) 40 MG TABLET    Take 1 tablet (40 mg total) by mouth daily at 6 PM.   SENNA-DOCUSATE (SENOKOT-S) 8.6-50 MG PER TABLET    Take 2 tablets by mouth 2 (two) times daily.  Modified Medications   No medications on file  Discontinued Medications   No medications on file     No Known Allergies   REVIEW OF SYSTEMS:  GENERAL: no change in appetite, no fatigue, no weight changes, no fever, chills or weakness EYES: Denies change in vision, dry eyes,  eye pain, itching or discharge EARS: Denies change in hearing, ringing in ears, or earache NOSE: Denies nasal congestion or epistaxis MOUTH and THROAT: Denies oral discomfort, gingival pain or bleeding, pain from teeth or hoarseness   RESPIRATORY: no cough, SOB, DOE, wheezing, hemoptysis CARDIAC: no chest pain, edema or palpitations GI: no abdominal pain, diarrhea, constipation, heart burn, nausea or vomiting GU: Denies dysuria, frequency, hematuria, incontinence, or discharge PSYCHIATRIC: Denies feeling of depression or anxiety. No report of hallucinations, insomnia, paranoia, or agitation   PHYSICAL EXAMINATION  GENERAL APPEARANCE: Well nourished. In no acute distress. Normal body habitus HEAD: Normal in size and contour. No evidence of trauma EYES: Lids open and close normally. No blepharitis, entropion or ectropion. PERRL. Conjunctivae are clear and sclerae are white. Lenses are without opacity EARS: Pinnae are normal. Patient hears normal voice tunes of the examiner MOUTH and THROAT: Lips are without lesions. Oral mucosa is moist and without lesions. Tongue is normal in shape, size, and color and without lesions NECK: supple, trachea midline, no neck masses, no thyroid tenderness, no thyromegaly LYMPHATICS: no LAN in the neck, no supraclavicular LAN RESPIRATORY: breathing is even & unlabored, BS CTAB CARDIAC: RRR, no murmur,no extra heart sounds, no edema GI: abdomen soft, normal BS, no masses, no tenderness, no hepatomegaly, no splenomegaly EXTREMITIES:  Able to move 4 extremities; LUE slight weakness PSYCHIATRIC: Alert and oriented X 3. Affect and behavior are appropriate  LABS/RADIOLOGY: Labs reviewed: Basic Metabolic Panel:  Recent Labs  08/65/78 0019 03/23/15 0026 03/27/15 0626 03/31/15 0624  NA 139 142 139 137  K 3.8 3.7 3.3* 3.8  CL 106 106 103 102  CO2 24  --  25 25  GLUCOSE 155* 154* 105* 96  BUN CREATININE 0.67 0.60 0.52 0.54  CALCIUM 9.2  --   9.0 9.1   Liver Function Tests:  Recent Labs  03/23/15 0019 03/27/15 0626  AST 29 22  ALT 12* 12*  ALKPHOS 64 54  BILITOT 0.5 0.9  PROT 6.8 6.7  ALBUMIN 3.5 3.0*    CBC:  Recent Labs  03/23/15 0019  03/27/15 0626 03/31/15 0624 04/08/15 0630  WBC 5.9  --  6.1 4.8 4.5  NEUTROABS 3.7  --  4.0  --  2.6  HGB 9.9*  < > 9.6* 9.8* 10.2*  HCT 32.3*  < > 31.0* 31.7* 33.1*  MCV 80.3  --  79.9 80.7 82.1  PLT 212  --  194 235 273  < > = values in this interval not displayed.  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 Pearlean Brownie, neurology, and 4-6 weeks; follow-up with Dr. Wynn Banker, physical medicine and rehabilitation, 05/05/15  Hypertension -  well controlled; continue Norvasc 2.5 mg 1 tab by mouth daily and metoprolol 50 mg 1 tab by mouth twice a day  Pseudogout of left wrist   - continue Voltaren gel 1% apply 2 g topically to left wrist 3 times a day   Hypothyroidism - continue Synthroid 25 g 1 tab by mouth daily  Hyperlipidemia - continue Pravachol 40 mg 1 tab by mouth daily  Constipation - continue senna S2 tabs by mouth twice a day    Goals of care:  Short-term rehabilitation    [facility], NP [facility] [phone]

## 2015-05-09 DIAGNOSIS — I252 Old myocardial infarction: Secondary | ICD-10-CM | POA: Diagnosis not present

## 2015-05-09 DIAGNOSIS — I69398 Other sequelae of cerebral infarction: Secondary | ICD-10-CM | POA: Diagnosis not present

## 2015-05-09 DIAGNOSIS — I69354 Hemiplegia and hemiparesis following cerebral infarction affecting left non-dominant side: Secondary | ICD-10-CM | POA: Diagnosis not present

## 2015-05-09 DIAGNOSIS — H53469 Homonymous bilateral field defects, unspecified side: Secondary | ICD-10-CM | POA: Diagnosis not present

## 2015-05-09 DIAGNOSIS — M19032 Primary osteoarthritis, left wrist: Secondary | ICD-10-CM | POA: Diagnosis not present

## 2015-05-09 DIAGNOSIS — I10 Essential (primary) hypertension: Secondary | ICD-10-CM | POA: Diagnosis not present

## 2015-05-12 DIAGNOSIS — I69398 Other sequelae of cerebral infarction: Secondary | ICD-10-CM | POA: Diagnosis not present

## 2015-05-12 DIAGNOSIS — M19032 Primary osteoarthritis, left wrist: Secondary | ICD-10-CM | POA: Diagnosis not present

## 2015-05-12 DIAGNOSIS — I10 Essential (primary) hypertension: Secondary | ICD-10-CM | POA: Diagnosis not present

## 2015-05-12 DIAGNOSIS — I252 Old myocardial infarction: Secondary | ICD-10-CM | POA: Diagnosis not present

## 2015-05-12 DIAGNOSIS — I69354 Hemiplegia and hemiparesis following cerebral infarction affecting left non-dominant side: Secondary | ICD-10-CM | POA: Diagnosis not present

## 2015-05-12 DIAGNOSIS — H53469 Homonymous bilateral field defects, unspecified side: Secondary | ICD-10-CM | POA: Diagnosis not present

## 2015-05-13 DIAGNOSIS — I69354 Hemiplegia and hemiparesis following cerebral infarction affecting left non-dominant side: Secondary | ICD-10-CM | POA: Diagnosis not present

## 2015-05-13 DIAGNOSIS — I10 Essential (primary) hypertension: Secondary | ICD-10-CM | POA: Diagnosis not present

## 2015-05-13 DIAGNOSIS — M19032 Primary osteoarthritis, left wrist: Secondary | ICD-10-CM | POA: Diagnosis not present

## 2015-05-13 DIAGNOSIS — H53469 Homonymous bilateral field defects, unspecified side: Secondary | ICD-10-CM | POA: Diagnosis not present

## 2015-05-13 DIAGNOSIS — I252 Old myocardial infarction: Secondary | ICD-10-CM | POA: Diagnosis not present

## 2015-05-13 DIAGNOSIS — I69398 Other sequelae of cerebral infarction: Secondary | ICD-10-CM | POA: Diagnosis not present

## 2015-05-14 DIAGNOSIS — I252 Old myocardial infarction: Secondary | ICD-10-CM | POA: Diagnosis not present

## 2015-05-14 DIAGNOSIS — I69398 Other sequelae of cerebral infarction: Secondary | ICD-10-CM | POA: Diagnosis not present

## 2015-05-14 DIAGNOSIS — I69354 Hemiplegia and hemiparesis following cerebral infarction affecting left non-dominant side: Secondary | ICD-10-CM | POA: Diagnosis not present

## 2015-05-14 DIAGNOSIS — M19032 Primary osteoarthritis, left wrist: Secondary | ICD-10-CM | POA: Diagnosis not present

## 2015-05-14 DIAGNOSIS — I10 Essential (primary) hypertension: Secondary | ICD-10-CM | POA: Diagnosis not present

## 2015-05-14 DIAGNOSIS — H53469 Homonymous bilateral field defects, unspecified side: Secondary | ICD-10-CM | POA: Diagnosis not present

## 2015-05-15 DIAGNOSIS — M19032 Primary osteoarthritis, left wrist: Secondary | ICD-10-CM | POA: Diagnosis not present

## 2015-05-15 DIAGNOSIS — I69354 Hemiplegia and hemiparesis following cerebral infarction affecting left non-dominant side: Secondary | ICD-10-CM | POA: Diagnosis not present

## 2015-05-15 DIAGNOSIS — H53469 Homonymous bilateral field defects, unspecified side: Secondary | ICD-10-CM | POA: Diagnosis not present

## 2015-05-15 DIAGNOSIS — I252 Old myocardial infarction: Secondary | ICD-10-CM | POA: Diagnosis not present

## 2015-05-15 DIAGNOSIS — I69398 Other sequelae of cerebral infarction: Secondary | ICD-10-CM | POA: Diagnosis not present

## 2015-05-15 DIAGNOSIS — I10 Essential (primary) hypertension: Secondary | ICD-10-CM | POA: Diagnosis not present

## 2015-05-16 DIAGNOSIS — Z79899 Other long term (current) drug therapy: Secondary | ICD-10-CM | POA: Diagnosis not present

## 2015-05-16 DIAGNOSIS — K5900 Constipation, unspecified: Secondary | ICD-10-CM | POA: Diagnosis not present

## 2015-05-16 DIAGNOSIS — Z23 Encounter for immunization: Secondary | ICD-10-CM | POA: Diagnosis not present

## 2015-05-16 DIAGNOSIS — G4700 Insomnia, unspecified: Secondary | ICD-10-CM | POA: Diagnosis not present

## 2015-05-16 DIAGNOSIS — I6930 Unspecified sequelae of cerebral infarction: Secondary | ICD-10-CM | POA: Diagnosis not present

## 2015-05-16 DIAGNOSIS — E039 Hypothyroidism, unspecified: Secondary | ICD-10-CM | POA: Diagnosis not present

## 2015-05-19 DIAGNOSIS — I10 Essential (primary) hypertension: Secondary | ICD-10-CM | POA: Diagnosis not present

## 2015-05-19 DIAGNOSIS — I69354 Hemiplegia and hemiparesis following cerebral infarction affecting left non-dominant side: Secondary | ICD-10-CM | POA: Diagnosis not present

## 2015-05-19 DIAGNOSIS — I69398 Other sequelae of cerebral infarction: Secondary | ICD-10-CM | POA: Diagnosis not present

## 2015-05-19 DIAGNOSIS — H53469 Homonymous bilateral field defects, unspecified side: Secondary | ICD-10-CM | POA: Diagnosis not present

## 2015-05-19 DIAGNOSIS — M19032 Primary osteoarthritis, left wrist: Secondary | ICD-10-CM | POA: Diagnosis not present

## 2015-05-19 DIAGNOSIS — I252 Old myocardial infarction: Secondary | ICD-10-CM | POA: Diagnosis not present

## 2015-05-20 DIAGNOSIS — H53469 Homonymous bilateral field defects, unspecified side: Secondary | ICD-10-CM | POA: Diagnosis not present

## 2015-05-20 DIAGNOSIS — I69354 Hemiplegia and hemiparesis following cerebral infarction affecting left non-dominant side: Secondary | ICD-10-CM | POA: Diagnosis not present

## 2015-05-20 DIAGNOSIS — M19032 Primary osteoarthritis, left wrist: Secondary | ICD-10-CM | POA: Diagnosis not present

## 2015-05-20 DIAGNOSIS — I252 Old myocardial infarction: Secondary | ICD-10-CM | POA: Diagnosis not present

## 2015-05-20 DIAGNOSIS — I10 Essential (primary) hypertension: Secondary | ICD-10-CM | POA: Diagnosis not present

## 2015-05-20 DIAGNOSIS — I69398 Other sequelae of cerebral infarction: Secondary | ICD-10-CM | POA: Diagnosis not present

## 2015-05-22 DIAGNOSIS — M19032 Primary osteoarthritis, left wrist: Secondary | ICD-10-CM | POA: Diagnosis not present

## 2015-05-22 DIAGNOSIS — I69354 Hemiplegia and hemiparesis following cerebral infarction affecting left non-dominant side: Secondary | ICD-10-CM | POA: Diagnosis not present

## 2015-05-22 DIAGNOSIS — I69398 Other sequelae of cerebral infarction: Secondary | ICD-10-CM | POA: Diagnosis not present

## 2015-05-22 DIAGNOSIS — I252 Old myocardial infarction: Secondary | ICD-10-CM | POA: Diagnosis not present

## 2015-05-22 DIAGNOSIS — H53469 Homonymous bilateral field defects, unspecified side: Secondary | ICD-10-CM | POA: Diagnosis not present

## 2015-05-22 DIAGNOSIS — I10 Essential (primary) hypertension: Secondary | ICD-10-CM | POA: Diagnosis not present

## 2015-05-23 DIAGNOSIS — M19032 Primary osteoarthritis, left wrist: Secondary | ICD-10-CM | POA: Diagnosis not present

## 2015-05-23 DIAGNOSIS — I69398 Other sequelae of cerebral infarction: Secondary | ICD-10-CM | POA: Diagnosis not present

## 2015-05-23 DIAGNOSIS — I252 Old myocardial infarction: Secondary | ICD-10-CM | POA: Diagnosis not present

## 2015-05-23 DIAGNOSIS — I10 Essential (primary) hypertension: Secondary | ICD-10-CM | POA: Diagnosis not present

## 2015-05-23 DIAGNOSIS — I69354 Hemiplegia and hemiparesis following cerebral infarction affecting left non-dominant side: Secondary | ICD-10-CM | POA: Diagnosis not present

## 2015-05-23 DIAGNOSIS — H53469 Homonymous bilateral field defects, unspecified side: Secondary | ICD-10-CM | POA: Diagnosis not present

## 2015-05-26 DIAGNOSIS — I69354 Hemiplegia and hemiparesis following cerebral infarction affecting left non-dominant side: Secondary | ICD-10-CM | POA: Diagnosis not present

## 2015-05-26 DIAGNOSIS — I252 Old myocardial infarction: Secondary | ICD-10-CM | POA: Diagnosis not present

## 2015-05-26 DIAGNOSIS — M19032 Primary osteoarthritis, left wrist: Secondary | ICD-10-CM | POA: Diagnosis not present

## 2015-05-26 DIAGNOSIS — I10 Essential (primary) hypertension: Secondary | ICD-10-CM | POA: Diagnosis not present

## 2015-05-26 DIAGNOSIS — I69398 Other sequelae of cerebral infarction: Secondary | ICD-10-CM | POA: Diagnosis not present

## 2015-05-26 DIAGNOSIS — H53469 Homonymous bilateral field defects, unspecified side: Secondary | ICD-10-CM | POA: Diagnosis not present

## 2015-05-27 DIAGNOSIS — M19032 Primary osteoarthritis, left wrist: Secondary | ICD-10-CM | POA: Diagnosis not present

## 2015-05-27 DIAGNOSIS — I10 Essential (primary) hypertension: Secondary | ICD-10-CM | POA: Diagnosis not present

## 2015-05-27 DIAGNOSIS — I69398 Other sequelae of cerebral infarction: Secondary | ICD-10-CM | POA: Diagnosis not present

## 2015-05-27 DIAGNOSIS — H53469 Homonymous bilateral field defects, unspecified side: Secondary | ICD-10-CM | POA: Diagnosis not present

## 2015-05-27 DIAGNOSIS — I69354 Hemiplegia and hemiparesis following cerebral infarction affecting left non-dominant side: Secondary | ICD-10-CM | POA: Diagnosis not present

## 2015-05-27 DIAGNOSIS — I252 Old myocardial infarction: Secondary | ICD-10-CM | POA: Diagnosis not present

## 2015-05-29 DIAGNOSIS — I252 Old myocardial infarction: Secondary | ICD-10-CM | POA: Diagnosis not present

## 2015-05-29 DIAGNOSIS — M19032 Primary osteoarthritis, left wrist: Secondary | ICD-10-CM | POA: Diagnosis not present

## 2015-05-29 DIAGNOSIS — H53469 Homonymous bilateral field defects, unspecified side: Secondary | ICD-10-CM | POA: Diagnosis not present

## 2015-05-29 DIAGNOSIS — I69354 Hemiplegia and hemiparesis following cerebral infarction affecting left non-dominant side: Secondary | ICD-10-CM | POA: Diagnosis not present

## 2015-05-29 DIAGNOSIS — I69398 Other sequelae of cerebral infarction: Secondary | ICD-10-CM | POA: Diagnosis not present

## 2015-05-29 DIAGNOSIS — I10 Essential (primary) hypertension: Secondary | ICD-10-CM | POA: Diagnosis not present

## 2015-05-30 DIAGNOSIS — I69354 Hemiplegia and hemiparesis following cerebral infarction affecting left non-dominant side: Secondary | ICD-10-CM | POA: Diagnosis not present

## 2015-05-30 DIAGNOSIS — M19032 Primary osteoarthritis, left wrist: Secondary | ICD-10-CM | POA: Diagnosis not present

## 2015-05-30 DIAGNOSIS — I69398 Other sequelae of cerebral infarction: Secondary | ICD-10-CM | POA: Diagnosis not present

## 2015-05-30 DIAGNOSIS — H53469 Homonymous bilateral field defects, unspecified side: Secondary | ICD-10-CM | POA: Diagnosis not present

## 2015-05-30 DIAGNOSIS — I10 Essential (primary) hypertension: Secondary | ICD-10-CM | POA: Diagnosis not present

## 2015-05-30 DIAGNOSIS — I252 Old myocardial infarction: Secondary | ICD-10-CM | POA: Diagnosis not present

## 2015-06-02 DIAGNOSIS — H53469 Homonymous bilateral field defects, unspecified side: Secondary | ICD-10-CM | POA: Diagnosis not present

## 2015-06-02 DIAGNOSIS — I69354 Hemiplegia and hemiparesis following cerebral infarction affecting left non-dominant side: Secondary | ICD-10-CM | POA: Diagnosis not present

## 2015-06-02 DIAGNOSIS — I252 Old myocardial infarction: Secondary | ICD-10-CM | POA: Diagnosis not present

## 2015-06-02 DIAGNOSIS — I10 Essential (primary) hypertension: Secondary | ICD-10-CM | POA: Diagnosis not present

## 2015-06-02 DIAGNOSIS — M19032 Primary osteoarthritis, left wrist: Secondary | ICD-10-CM | POA: Diagnosis not present

## 2015-06-02 DIAGNOSIS — I69398 Other sequelae of cerebral infarction: Secondary | ICD-10-CM | POA: Diagnosis not present

## 2015-06-03 ENCOUNTER — Encounter: Payer: Self-pay | Admitting: Physical Medicine & Rehabilitation

## 2015-06-03 ENCOUNTER — Encounter: Payer: Medicare Other | Attending: Physical Medicine & Rehabilitation

## 2015-06-03 ENCOUNTER — Ambulatory Visit (HOSPITAL_BASED_OUTPATIENT_CLINIC_OR_DEPARTMENT_OTHER): Payer: Medicare Other | Admitting: Physical Medicine & Rehabilitation

## 2015-06-03 VITALS — BP 131/73 | HR 60

## 2015-06-03 DIAGNOSIS — R208 Other disturbances of skin sensation: Secondary | ICD-10-CM | POA: Insufficient documentation

## 2015-06-03 DIAGNOSIS — I69354 Hemiplegia and hemiparesis following cerebral infarction affecting left non-dominant side: Secondary | ICD-10-CM | POA: Diagnosis not present

## 2015-06-03 DIAGNOSIS — G8190 Hemiplegia, unspecified affecting unspecified side: Secondary | ICD-10-CM | POA: Insufficient documentation

## 2015-06-03 DIAGNOSIS — IMO0002 Reserved for concepts with insufficient information to code with codable children: Secondary | ICD-10-CM

## 2015-06-03 DIAGNOSIS — I639 Cerebral infarction, unspecified: Secondary | ICD-10-CM

## 2015-06-03 DIAGNOSIS — I69398 Other sequelae of cerebral infarction: Secondary | ICD-10-CM | POA: Diagnosis not present

## 2015-06-03 DIAGNOSIS — H53469 Homonymous bilateral field defects, unspecified side: Secondary | ICD-10-CM | POA: Diagnosis not present

## 2015-06-03 DIAGNOSIS — I6931 Attention and concentration deficit following cerebral infarction: Secondary | ICD-10-CM | POA: Diagnosis not present

## 2015-06-03 DIAGNOSIS — E785 Hyperlipidemia, unspecified: Secondary | ICD-10-CM | POA: Insufficient documentation

## 2015-06-03 DIAGNOSIS — I252 Old myocardial infarction: Secondary | ICD-10-CM | POA: Diagnosis not present

## 2015-06-03 DIAGNOSIS — I69319 Unspecified symptoms and signs involving cognitive functions following cerebral infarction: Secondary | ICD-10-CM | POA: Diagnosis not present

## 2015-06-03 DIAGNOSIS — I10 Essential (primary) hypertension: Secondary | ICD-10-CM | POA: Diagnosis not present

## 2015-06-03 DIAGNOSIS — G8194 Hemiplegia, unspecified affecting left nondominant side: Secondary | ICD-10-CM

## 2015-06-03 DIAGNOSIS — M19032 Primary osteoarthritis, left wrist: Secondary | ICD-10-CM | POA: Diagnosis not present

## 2015-06-03 NOTE — Patient Instructions (Signed)
No driving No cooking

## 2015-06-03 NOTE — Progress Notes (Signed)
Subjective:    Patient ID: Leah Figueroa, female    DOB: [birth year], 81 y.o.   MRN: [id number]  HPI Patient is a 81 year old female with right MCA infarct onset 03/22/2015. She was stabilized medically and transferred to the inpatient rehabilitation unit at [facility] or she stayed on the stroke rehabilitation team Admit date: 03/26/2015 Discharge date: 04/08/2015 The patient was then discharged to [facility] skilled nursing facility on 04/08/2015 where she stayed until 05/08/2015. Since that time she was discharged to her home. Her daughter provides 42 7 care for her. She is receiving home health PT OT and nursing. Patient is able to dress her lower body but has difficulty with her upper body. She also needs assistance with bathing. She is unable to cook or do any type of housework. She ambulates with a walker. Prior to her stroke she was driving and fully independent.   Pain Inventory Average Pain 0 Pain Right Now 0 My pain is no pain  In the last 24 hours, has pain interfered with the following? General activity 0 Relation with others 0 Enjoyment of life 0 What TIME of day is your pain at its worst? no pain Sleep (in general) Fair  Pain is worse with: no pain Pain improves with: no pain Relief from Meds: no pain  Mobility walk with assistance use a walker how many minutes can you walk? 30 ability to climb steps?  yes do you drive?  no Do you have any goals in this area?  no  Function retired I need assistance with the following:  dressing, bathing, toileting, meal prep, household duties and shopping  Neuro/Psych bowel control problems depression anxiety  Prior Studies Any changes since last visit?  no  Physicians involved in your care Any changes since last visit?  no   Family History  Problem Relation Age of Onset  . Colon cancer Mother   . Coronary artery disease Brother   . Breast cancer Mother    Social History   Social History  .  Marital Status: Widowed    Spouse Name: N/A  . Number of Children: 1  . Years of Education: 11   Occupational History  . retired    Social History Main Topics  . Smoking status: Never Smoker   . Smokeless tobacco: Never Used  . Alcohol Use: No  . Drug Use: No  . Sexual Activity: No   Other Topics Concern  . None   Social History Narrative   HSG. Married '48- 57 yrs/widowed. 1 daughter - '59; 1 grandchild. 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  Lives alone. I-ADLs, manages books, pays bills.. End-of-Life Care: no heroic measures; No CPR, no mechanical ventilation.  Caffeine use/day:  None. Does Patient Exercise:  No       04/29/15 currently resident at [facility] rehab      Past Surgical History  Procedure Laterality Date  . Right thyroid lobectomy    . Cataract extraction      left-2006, right 2011  . Intrathoracic goiter resection    . Cholecystectomy    . Lumpectomy for breast cancer Left 80's    radiation therapy  . Back surgery     Past Medical History  Diagnosis Date  . Other specified personal history presenting hazards to health(V15.89)   . Personal history of arthritis   . Unspecified personal history presenting hazards to health   . Cramp of limb   . Acute myocardial infarction of other anterior wall,  episode of care unspecified   . Personal history of malignant neoplasm of breast   . Unspecified essential hypertension   . Personal history of other diseases of digestive system   . Unspecified hypothyroidism   . Other and unspecified hyperlipidemia   . Stroke [facility]) 03/2015    "left sided issues"   BP 131/73 mmHg  Pulse 60  SpO2 97%  Opioid Risk Score:   Fall Risk Score:  `1  Depression screen PHQ 2/9  Depression screen PHQ 2/9 04/29/2015  Decreased Interest 2  Down, Depressed, Hopeless 3  PHQ - 2 Score 5  Altered sleeping 2  Tired, decreased energy 0  Change in appetite 3  Feeling bad or failure about yourself  2  Trouble concentrating 0    Moving slowly or fidgety/restless 0  Suicidal thoughts 0  PHQ-9 Score 12  Difficult doing work/chores Not difficult at all     Review of Systems  All other systems reviewed and are negative.      Objective:   Physical Exam  Constitutional: She is oriented to person, place, and time. She appears well-developed and well-nourished.  HENT:  Head: Normocephalic and atraumatic.  Eyes: Conjunctivae are normal. Pupils are equal, round, and reactive to light.  Patient has intact visual fields however left neglect on confrontation testing  Neck: Normal range of motion.  Neurological: She is alert and oriented to person, place, and time. A sensory deficit (5/5 in the right deltoid, biceps, triceps, grip, hip flexor, knee extensor, ankle dorsiflexor and plantar flexor) is present. Gait abnormal.  Motor strength is 4/5 in the left deltoid, bicep, triceps, grip, hip flexor, knee extensor, ankle dorsi flexors and plantar flexor  5/5 in the right deltoid, biceps, triceps, grip, hip flexor, knee extensor, ankle dorsiflexor and plantar flexorSensation is absent to light touch in the left upper extremity.  Ambulates with a rolling walker without evidence of toe drag or need stability. She can ambulate with hands assistance. She does have a wide base of support short step length and tends to lean toward the left side.  Psychiatric: She has a normal mood and affect.  Nursing note and vitals reviewed.         Assessment & Plan:  1. Right MCA infarct with residual left hemiparesis, left neglect and left hemisensory deficits. She still remains appropriate for home health therapy with PT OT. Pre-stroke she did not require any assisted device. She continues to use a walker at the current time. I will reassess in 1 month if she still using a walker I would recommend outpatient therapy as home health would be finishing up around that time. No driving, poor prognosis in return to driving given her  persistent left neglect No cooking or using sharp knives due to severe left upper extremity sensory deficits She continues require 24 7 supervision provided by her family

## 2015-06-04 ENCOUNTER — Other Ambulatory Visit: Payer: Self-pay | Admitting: Adult Health

## 2015-06-04 DIAGNOSIS — I69398 Other sequelae of cerebral infarction: Secondary | ICD-10-CM | POA: Diagnosis not present

## 2015-06-04 DIAGNOSIS — I252 Old myocardial infarction: Secondary | ICD-10-CM | POA: Diagnosis not present

## 2015-06-04 DIAGNOSIS — M19032 Primary osteoarthritis, left wrist: Secondary | ICD-10-CM | POA: Diagnosis not present

## 2015-06-04 DIAGNOSIS — I10 Essential (primary) hypertension: Secondary | ICD-10-CM | POA: Diagnosis not present

## 2015-06-04 DIAGNOSIS — H53469 Homonymous bilateral field defects, unspecified side: Secondary | ICD-10-CM | POA: Diagnosis not present

## 2015-06-04 DIAGNOSIS — I69354 Hemiplegia and hemiparesis following cerebral infarction affecting left non-dominant side: Secondary | ICD-10-CM | POA: Diagnosis not present

## 2015-06-05 DIAGNOSIS — I69398 Other sequelae of cerebral infarction: Secondary | ICD-10-CM | POA: Diagnosis not present

## 2015-06-05 DIAGNOSIS — I69354 Hemiplegia and hemiparesis following cerebral infarction affecting left non-dominant side: Secondary | ICD-10-CM | POA: Diagnosis not present

## 2015-06-05 DIAGNOSIS — I10 Essential (primary) hypertension: Secondary | ICD-10-CM | POA: Diagnosis not present

## 2015-06-05 DIAGNOSIS — I252 Old myocardial infarction: Secondary | ICD-10-CM | POA: Diagnosis not present

## 2015-06-05 DIAGNOSIS — H53469 Homonymous bilateral field defects, unspecified side: Secondary | ICD-10-CM | POA: Diagnosis not present

## 2015-06-05 DIAGNOSIS — M19032 Primary osteoarthritis, left wrist: Secondary | ICD-10-CM | POA: Diagnosis not present

## 2015-06-09 DIAGNOSIS — M19032 Primary osteoarthritis, left wrist: Secondary | ICD-10-CM | POA: Diagnosis not present

## 2015-06-09 DIAGNOSIS — I69398 Other sequelae of cerebral infarction: Secondary | ICD-10-CM | POA: Diagnosis not present

## 2015-06-09 DIAGNOSIS — I10 Essential (primary) hypertension: Secondary | ICD-10-CM | POA: Diagnosis not present

## 2015-06-09 DIAGNOSIS — H53469 Homonymous bilateral field defects, unspecified side: Secondary | ICD-10-CM | POA: Diagnosis not present

## 2015-06-09 DIAGNOSIS — I69354 Hemiplegia and hemiparesis following cerebral infarction affecting left non-dominant side: Secondary | ICD-10-CM | POA: Diagnosis not present

## 2015-06-09 DIAGNOSIS — I252 Old myocardial infarction: Secondary | ICD-10-CM | POA: Diagnosis not present

## 2015-06-10 DIAGNOSIS — I252 Old myocardial infarction: Secondary | ICD-10-CM | POA: Diagnosis not present

## 2015-06-10 DIAGNOSIS — I69398 Other sequelae of cerebral infarction: Secondary | ICD-10-CM | POA: Diagnosis not present

## 2015-06-10 DIAGNOSIS — M19032 Primary osteoarthritis, left wrist: Secondary | ICD-10-CM | POA: Diagnosis not present

## 2015-06-10 DIAGNOSIS — I10 Essential (primary) hypertension: Secondary | ICD-10-CM | POA: Diagnosis not present

## 2015-06-10 DIAGNOSIS — I69354 Hemiplegia and hemiparesis following cerebral infarction affecting left non-dominant side: Secondary | ICD-10-CM | POA: Diagnosis not present

## 2015-06-10 DIAGNOSIS — H53469 Homonymous bilateral field defects, unspecified side: Secondary | ICD-10-CM | POA: Diagnosis not present

## 2015-06-11 DIAGNOSIS — I252 Old myocardial infarction: Secondary | ICD-10-CM | POA: Diagnosis not present

## 2015-06-11 DIAGNOSIS — I69354 Hemiplegia and hemiparesis following cerebral infarction affecting left non-dominant side: Secondary | ICD-10-CM | POA: Diagnosis not present

## 2015-06-11 DIAGNOSIS — I69398 Other sequelae of cerebral infarction: Secondary | ICD-10-CM | POA: Diagnosis not present

## 2015-06-11 DIAGNOSIS — H53469 Homonymous bilateral field defects, unspecified side: Secondary | ICD-10-CM | POA: Diagnosis not present

## 2015-06-11 DIAGNOSIS — M19032 Primary osteoarthritis, left wrist: Secondary | ICD-10-CM | POA: Diagnosis not present

## 2015-06-11 DIAGNOSIS — I10 Essential (primary) hypertension: Secondary | ICD-10-CM | POA: Diagnosis not present

## 2015-06-12 DIAGNOSIS — M19032 Primary osteoarthritis, left wrist: Secondary | ICD-10-CM | POA: Diagnosis not present

## 2015-06-12 DIAGNOSIS — I10 Essential (primary) hypertension: Secondary | ICD-10-CM | POA: Diagnosis not present

## 2015-06-12 DIAGNOSIS — I69398 Other sequelae of cerebral infarction: Secondary | ICD-10-CM | POA: Diagnosis not present

## 2015-06-12 DIAGNOSIS — I252 Old myocardial infarction: Secondary | ICD-10-CM | POA: Diagnosis not present

## 2015-06-12 DIAGNOSIS — I69354 Hemiplegia and hemiparesis following cerebral infarction affecting left non-dominant side: Secondary | ICD-10-CM | POA: Diagnosis not present

## 2015-06-12 DIAGNOSIS — H53469 Homonymous bilateral field defects, unspecified side: Secondary | ICD-10-CM | POA: Diagnosis not present

## 2015-06-17 DIAGNOSIS — I10 Essential (primary) hypertension: Secondary | ICD-10-CM | POA: Diagnosis not present

## 2015-06-17 DIAGNOSIS — H53469 Homonymous bilateral field defects, unspecified side: Secondary | ICD-10-CM | POA: Diagnosis not present

## 2015-06-17 DIAGNOSIS — I252 Old myocardial infarction: Secondary | ICD-10-CM | POA: Diagnosis not present

## 2015-06-17 DIAGNOSIS — I69398 Other sequelae of cerebral infarction: Secondary | ICD-10-CM | POA: Diagnosis not present

## 2015-06-17 DIAGNOSIS — M19032 Primary osteoarthritis, left wrist: Secondary | ICD-10-CM | POA: Diagnosis not present

## 2015-06-17 DIAGNOSIS — I69354 Hemiplegia and hemiparesis following cerebral infarction affecting left non-dominant side: Secondary | ICD-10-CM | POA: Diagnosis not present

## 2015-06-18 DIAGNOSIS — I10 Essential (primary) hypertension: Secondary | ICD-10-CM | POA: Diagnosis not present

## 2015-06-18 DIAGNOSIS — I69398 Other sequelae of cerebral infarction: Secondary | ICD-10-CM | POA: Diagnosis not present

## 2015-06-18 DIAGNOSIS — I252 Old myocardial infarction: Secondary | ICD-10-CM | POA: Diagnosis not present

## 2015-06-18 DIAGNOSIS — I69354 Hemiplegia and hemiparesis following cerebral infarction affecting left non-dominant side: Secondary | ICD-10-CM | POA: Diagnosis not present

## 2015-06-18 DIAGNOSIS — M19032 Primary osteoarthritis, left wrist: Secondary | ICD-10-CM | POA: Diagnosis not present

## 2015-06-18 DIAGNOSIS — H53469 Homonymous bilateral field defects, unspecified side: Secondary | ICD-10-CM | POA: Diagnosis not present

## 2015-06-19 DIAGNOSIS — I252 Old myocardial infarction: Secondary | ICD-10-CM | POA: Diagnosis not present

## 2015-06-19 DIAGNOSIS — M19032 Primary osteoarthritis, left wrist: Secondary | ICD-10-CM | POA: Diagnosis not present

## 2015-06-19 DIAGNOSIS — I69398 Other sequelae of cerebral infarction: Secondary | ICD-10-CM | POA: Diagnosis not present

## 2015-06-19 DIAGNOSIS — I10 Essential (primary) hypertension: Secondary | ICD-10-CM | POA: Diagnosis not present

## 2015-06-19 DIAGNOSIS — H53469 Homonymous bilateral field defects, unspecified side: Secondary | ICD-10-CM | POA: Diagnosis not present

## 2015-06-19 DIAGNOSIS — I69354 Hemiplegia and hemiparesis following cerebral infarction affecting left non-dominant side: Secondary | ICD-10-CM | POA: Diagnosis not present

## 2015-06-23 DIAGNOSIS — I10 Essential (primary) hypertension: Secondary | ICD-10-CM | POA: Diagnosis not present

## 2015-06-23 DIAGNOSIS — R197 Diarrhea, unspecified: Secondary | ICD-10-CM | POA: Diagnosis not present

## 2015-06-23 DIAGNOSIS — D539 Nutritional anemia, unspecified: Secondary | ICD-10-CM | POA: Diagnosis not present

## 2015-06-23 DIAGNOSIS — H53469 Homonymous bilateral field defects, unspecified side: Secondary | ICD-10-CM | POA: Diagnosis not present

## 2015-06-23 DIAGNOSIS — I69354 Hemiplegia and hemiparesis following cerebral infarction affecting left non-dominant side: Secondary | ICD-10-CM | POA: Diagnosis not present

## 2015-06-23 DIAGNOSIS — M19032 Primary osteoarthritis, left wrist: Secondary | ICD-10-CM | POA: Diagnosis not present

## 2015-06-23 DIAGNOSIS — I252 Old myocardial infarction: Secondary | ICD-10-CM | POA: Diagnosis not present

## 2015-06-23 DIAGNOSIS — E039 Hypothyroidism, unspecified: Secondary | ICD-10-CM | POA: Diagnosis not present

## 2015-06-23 DIAGNOSIS — Z79899 Other long term (current) drug therapy: Secondary | ICD-10-CM | POA: Diagnosis not present

## 2015-06-23 DIAGNOSIS — I69398 Other sequelae of cerebral infarction: Secondary | ICD-10-CM | POA: Diagnosis not present

## 2015-06-24 DIAGNOSIS — I10 Essential (primary) hypertension: Secondary | ICD-10-CM | POA: Diagnosis not present

## 2015-06-24 DIAGNOSIS — I69354 Hemiplegia and hemiparesis following cerebral infarction affecting left non-dominant side: Secondary | ICD-10-CM | POA: Diagnosis not present

## 2015-06-24 DIAGNOSIS — I252 Old myocardial infarction: Secondary | ICD-10-CM | POA: Diagnosis not present

## 2015-06-24 DIAGNOSIS — I69398 Other sequelae of cerebral infarction: Secondary | ICD-10-CM | POA: Diagnosis not present

## 2015-06-24 DIAGNOSIS — H53469 Homonymous bilateral field defects, unspecified side: Secondary | ICD-10-CM | POA: Diagnosis not present

## 2015-06-24 DIAGNOSIS — M19032 Primary osteoarthritis, left wrist: Secondary | ICD-10-CM | POA: Diagnosis not present

## 2015-06-25 DIAGNOSIS — M19032 Primary osteoarthritis, left wrist: Secondary | ICD-10-CM | POA: Diagnosis not present

## 2015-06-25 DIAGNOSIS — I10 Essential (primary) hypertension: Secondary | ICD-10-CM | POA: Diagnosis not present

## 2015-06-25 DIAGNOSIS — I69398 Other sequelae of cerebral infarction: Secondary | ICD-10-CM | POA: Diagnosis not present

## 2015-06-25 DIAGNOSIS — I252 Old myocardial infarction: Secondary | ICD-10-CM | POA: Diagnosis not present

## 2015-06-25 DIAGNOSIS — H53469 Homonymous bilateral field defects, unspecified side: Secondary | ICD-10-CM | POA: Diagnosis not present

## 2015-06-25 DIAGNOSIS — I69354 Hemiplegia and hemiparesis following cerebral infarction affecting left non-dominant side: Secondary | ICD-10-CM | POA: Diagnosis not present

## 2015-06-26 DIAGNOSIS — I69398 Other sequelae of cerebral infarction: Secondary | ICD-10-CM | POA: Diagnosis not present

## 2015-06-26 DIAGNOSIS — I252 Old myocardial infarction: Secondary | ICD-10-CM | POA: Diagnosis not present

## 2015-06-26 DIAGNOSIS — I69354 Hemiplegia and hemiparesis following cerebral infarction affecting left non-dominant side: Secondary | ICD-10-CM | POA: Diagnosis not present

## 2015-06-26 DIAGNOSIS — I10 Essential (primary) hypertension: Secondary | ICD-10-CM | POA: Diagnosis not present

## 2015-06-26 DIAGNOSIS — M19032 Primary osteoarthritis, left wrist: Secondary | ICD-10-CM | POA: Diagnosis not present

## 2015-06-26 DIAGNOSIS — H53469 Homonymous bilateral field defects, unspecified side: Secondary | ICD-10-CM | POA: Diagnosis not present

## 2015-07-01 ENCOUNTER — Ambulatory Visit (HOSPITAL_BASED_OUTPATIENT_CLINIC_OR_DEPARTMENT_OTHER): Payer: Medicare Other | Admitting: Physical Medicine & Rehabilitation

## 2015-07-01 ENCOUNTER — Encounter: Payer: Self-pay | Admitting: Physical Medicine & Rehabilitation

## 2015-07-01 ENCOUNTER — Encounter: Payer: Medicare Other | Attending: Physical Medicine & Rehabilitation

## 2015-07-01 VITALS — BP 151/63 | HR 50

## 2015-07-01 DIAGNOSIS — I639 Cerebral infarction, unspecified: Secondary | ICD-10-CM | POA: Diagnosis not present

## 2015-07-01 DIAGNOSIS — Z8673 Personal history of transient ischemic attack (TIA), and cerebral infarction without residual deficits: Secondary | ICD-10-CM | POA: Diagnosis not present

## 2015-07-01 DIAGNOSIS — I6931 Attention and concentration deficit following cerebral infarction: Secondary | ICD-10-CM | POA: Insufficient documentation

## 2015-07-01 DIAGNOSIS — I69398 Other sequelae of cerebral infarction: Secondary | ICD-10-CM | POA: Insufficient documentation

## 2015-07-01 DIAGNOSIS — R414 Neurologic neglect syndrome: Secondary | ICD-10-CM

## 2015-07-01 DIAGNOSIS — I10 Essential (primary) hypertension: Secondary | ICD-10-CM | POA: Insufficient documentation

## 2015-07-01 DIAGNOSIS — E785 Hyperlipidemia, unspecified: Secondary | ICD-10-CM | POA: Diagnosis not present

## 2015-07-01 DIAGNOSIS — H53469 Homonymous bilateral field defects, unspecified side: Secondary | ICD-10-CM | POA: Diagnosis not present

## 2015-07-01 DIAGNOSIS — G8190 Hemiplegia, unspecified affecting unspecified side: Secondary | ICD-10-CM | POA: Diagnosis not present

## 2015-07-01 DIAGNOSIS — R208 Other disturbances of skin sensation: Secondary | ICD-10-CM | POA: Insufficient documentation

## 2015-07-01 DIAGNOSIS — IMO0002 Reserved for concepts with insufficient information to code with codable children: Secondary | ICD-10-CM

## 2015-07-01 DIAGNOSIS — M19032 Primary osteoarthritis, left wrist: Secondary | ICD-10-CM | POA: Diagnosis not present

## 2015-07-01 DIAGNOSIS — I252 Old myocardial infarction: Secondary | ICD-10-CM | POA: Diagnosis not present

## 2015-07-01 DIAGNOSIS — I69354 Hemiplegia and hemiparesis following cerebral infarction affecting left non-dominant side: Secondary | ICD-10-CM | POA: Diagnosis not present

## 2015-07-01 NOTE — Progress Notes (Signed)
Subjective:    Patient ID: Leah Figueroa, female    DOB: Aug 09, 1923, 81 y.o.   MRN: [id number]  HPI 81 year old female with right MCA infarct in August 2016. Underwent inpatient rehabilitation and was [facility] where she received PT OT and speech therapy until 05/08/2015. She was discharged to her home where her daughter is staying with her [id number] to provide supervision. Patient has been getting home health therapy PTOT speech and they are now finishing. Patient was able to bathe herself today. Still requires a little bit of help with bathing and dressing from her daughter. Ambulating with a Rollator walker  Pain Inventory Average Pain 0 Pain Right Now 0 My pain is NA  In the last 24 hours, has pain interfered with the following? General activity 0 Relation with others 0 Enjoyment of life 0 What TIME of day is your pain at its worst? NA Sleep (in general) Good  Pain is worse with: sitting Pain improves with: NA Relief from Meds: NA  Mobility walk without assistance use a walker ability to climb steps?  yes do you drive?  no Do you have any goals in this area?  no  Function not employed: date last employed NA I need assistance with the following:  dressing, bathing, toileting, meal prep, household duties and shopping Do you have any goals in this area?  no  Neuro/Psych No problems in this area  Prior Studies Any changes since last visit?  no  Physicians involved in your care Any changes since last visit?  no   Family History  Problem Relation Age of Onset  . Colon cancer Mother   . Coronary artery disease Brother   . Breast cancer Mother    Social History   Social History  . Marital Status: Widowed    Spouse Name: N/A  . Number of Children: 1  . Years of Education: 11   Occupational History  . retired    Social History Main Topics  . Smoking status: Never Smoker   .  Smokeless tobacco: Never Used  . Alcohol Use: No  . Drug Use: No  . Sexual Activity: No   Other Topics Concern  . None   Social History Narrative   HSG. Married '48- 57 yrs/widowed. 1 daughter - '59; 1 grandchild. 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  Lives alone. I-ADLs, manages books, pays bills.. End-of-Life Care: no heroic measures; No CPR, no mechanical ventilation.  Caffeine use/day:  None. Does Patient Exercise:  No       04/29/15 [facility]      Past Surgical History  Procedure Laterality Date  . Right thyroid lobectomy    . Cataract extraction      left-2006, right 2011  . Intrathoracic goiter resection    . Cholecystectomy    . Lumpectomy for breast cancer Left 80's    radiation therapy  . Back surgery     Past Medical History  Diagnosis Date  . Other specified personal history presenting hazards to health(V15.89)   . Personal history of arthritis   . Unspecified personal history presenting hazards to health   . Cramp of limb   . Acute myocardial infarction of other anterior wall, episode of care unspecified   . Personal history of malignant neoplasm of breast   . Unspecified essential hypertension   . Personal history of other diseases of digestive system   .  Unspecified hypothyroidism   . Other and unspecified hyperlipidemia   . Stroke [facility]) 03/2015    "left sided issues"   BP 151/63 mmHg  Pulse 50  SpO2 100%  Opioid Risk Score:   Fall Risk Score:  `1  Depression screen PHQ 2/9  Depression screen PHQ 2/9 04/29/2015  Decreased Interest 2  Down, Depressed, Hopeless 3  PHQ - 2 Score 5  Altered sleeping 2  Tired, decreased energy 0  Change in appetite 3  Feeling bad or failure about yourself  2  Trouble concentrating 0  Moving slowly or fidgety/restless 0  Suicidal thoughts 0  PHQ-9 Score 12  Difficult doing work/chores Not difficult at all     Review of Systems  All other systems reviewed and are negative.        Objective:   Physical Exam  Constitutional: She appears well-developed and well-nourished.  HENT:  Head: Normocephalic and atraumatic.  Eyes: Conjunctivae and EOM are normal. Pupils are equal, round, and reactive to light.  Neurological: She is alert.  Psychiatric: She has a normal mood and affect.  Nursing note and vitals reviewed.   Motor strength is 5/5 in the right deltoid, biceps, triceps, grip 4+ in the left deltoid, biceps, triceps, grip Gait is with a rolling walker. No evidence of toe drag or knee instability She has absent sensation to light touch in the left upper extremity. Visual fields show left neglect on confrontation testing Mood and affect are appropriate      Assessment & Plan:  1. Right MCA infarct with residual left hemiparesis, left neglect and left hemisensory deficits. She still remains appropriate for home health therapy with PT OT. Pre-stroke she did not require any assisted device. She continues to use a walker at the current time. I will reassess in 1 month if she still using a walker I would recommend outpatient therapy as home health would be finishing up around that time. No driving, poor prognosis in return to driving given her persistent left neglect No cooking or using sharp knives due to severe left upper extremity sensory deficits She continues require 24 7 supervision provided by her family  Return to physical medicine and rehabilitation clinic 1 month

## 2015-07-01 NOTE — Patient Instructions (Signed)
Physical therapy at [facility] rehabilitation will call you to set up appointments approximately 2 days a week for PT and OT.

## 2015-07-03 DIAGNOSIS — I69398 Other sequelae of cerebral infarction: Secondary | ICD-10-CM | POA: Diagnosis not present

## 2015-07-03 DIAGNOSIS — I252 Old myocardial infarction: Secondary | ICD-10-CM | POA: Diagnosis not present

## 2015-07-03 DIAGNOSIS — I10 Essential (primary) hypertension: Secondary | ICD-10-CM | POA: Diagnosis not present

## 2015-07-03 DIAGNOSIS — I69354 Hemiplegia and hemiparesis following cerebral infarction affecting left non-dominant side: Secondary | ICD-10-CM | POA: Diagnosis not present

## 2015-07-03 DIAGNOSIS — H53469 Homonymous bilateral field defects, unspecified side: Secondary | ICD-10-CM | POA: Diagnosis not present

## 2015-07-03 DIAGNOSIS — M19032 Primary osteoarthritis, left wrist: Secondary | ICD-10-CM | POA: Diagnosis not present

## 2015-07-09 DIAGNOSIS — E611 Iron deficiency: Secondary | ICD-10-CM | POA: Diagnosis not present

## 2015-07-09 DIAGNOSIS — I10 Essential (primary) hypertension: Secondary | ICD-10-CM | POA: Diagnosis not present

## 2015-07-18 ENCOUNTER — Emergency Department (HOSPITAL_COMMUNITY)
Admission: EM | Admit: 2015-07-18 | Discharge: 2015-07-18 | Disposition: A | Discharge status: Home / Self Care | Payer: Medicare Other | Attending: Emergency Medicine | Admitting: Emergency Medicine

## 2015-07-18 ENCOUNTER — Emergency Department (HOSPITAL_COMMUNITY): Payer: Medicare Other

## 2015-07-18 ENCOUNTER — Encounter (HOSPITAL_COMMUNITY): Payer: Self-pay

## 2015-07-18 DIAGNOSIS — I10 Essential (primary) hypertension: Secondary | ICD-10-CM | POA: Insufficient documentation

## 2015-07-18 DIAGNOSIS — S0990XA Unspecified injury of head, initial encounter: Secondary | ICD-10-CM | POA: Diagnosis not present

## 2015-07-18 DIAGNOSIS — R51 Headache: Secondary | ICD-10-CM | POA: Diagnosis not present

## 2015-07-18 DIAGNOSIS — S01312A Laceration without foreign body of left ear, initial encounter: Secondary | ICD-10-CM

## 2015-07-18 DIAGNOSIS — E039 Hypothyroidism, unspecified: Secondary | ICD-10-CM | POA: Insufficient documentation

## 2015-07-18 DIAGNOSIS — W01198A Fall on same level from slipping, tripping and stumbling with subsequent striking against other object, initial encounter: Secondary | ICD-10-CM | POA: Insufficient documentation

## 2015-07-18 DIAGNOSIS — S0181XA Laceration without foreign body of other part of head, initial encounter: Secondary | ICD-10-CM | POA: Diagnosis not present

## 2015-07-18 DIAGNOSIS — Z79899 Other long term (current) drug therapy: Secondary | ICD-10-CM | POA: Insufficient documentation

## 2015-07-18 DIAGNOSIS — I252 Old myocardial infarction: Secondary | ICD-10-CM | POA: Diagnosis not present

## 2015-07-18 DIAGNOSIS — Y998 Other external cause status: Secondary | ICD-10-CM | POA: Insufficient documentation

## 2015-07-18 DIAGNOSIS — T148 Other injury of unspecified body region: Secondary | ICD-10-CM | POA: Diagnosis not present

## 2015-07-18 DIAGNOSIS — Z8673 Personal history of transient ischemic attack (TIA), and cerebral infarction without residual deficits: Secondary | ICD-10-CM | POA: Insufficient documentation

## 2015-07-18 DIAGNOSIS — Y9389 Activity, other specified: Secondary | ICD-10-CM | POA: Insufficient documentation

## 2015-07-18 DIAGNOSIS — Y9289 Other specified places as the place of occurrence of the external cause: Secondary | ICD-10-CM | POA: Insufficient documentation

## 2015-07-18 LAB — CBC WITH DIFFERENTIAL/PLATELET
Basophils Absolute: 0 K/uL (ref 0.0–0.1)
Basophils Relative: 0 %
Eosinophils Absolute: 0 K/uL (ref 0.0–0.7)
Eosinophils Relative: 1 %
HCT: 39.9 % (ref 36.0–46.0)
Hemoglobin: 12.5 g/dL (ref 12.0–15.0)
Lymphocytes Relative: 15 %
Lymphs Abs: 0.9 K/uL (ref 0.7–4.0)
MCH: 28.2 pg (ref 26.0–34.0)
MCHC: 31.3 g/dL (ref 30.0–36.0)
MCV: 89.9 fL (ref 78.0–100.0)
Monocytes Absolute: 0.4 K/uL (ref 0.1–1.0)
Monocytes Relative: 8 %
Neutro Abs: 4.4 K/uL (ref 1.7–7.7)
Neutrophils Relative %: 76 %
Platelets: 219 K/uL (ref 150–400)
RBC: 4.44 MIL/uL (ref 3.87–5.11)
RDW: 17.3 % — ABNORMAL HIGH (ref 11.5–15.5)
WBC: 5.8 K/uL (ref 4.0–10.5)

## 2015-07-18 LAB — BASIC METABOLIC PANEL WITH GFR
Anion gap: 8 (ref 5–15)
BUN: 11 mg/dL (ref 6–20)
CO2: 28 mmol/L (ref 22–32)
Calcium: 9.6 mg/dL (ref 8.9–10.3)
Chloride: 106 mmol/L (ref 101–111)
Creatinine, Ser: 0.61 mg/dL (ref 0.44–1.00)
GFR calc Af Amer: >60 mL/min
GFR calc non Af Amer: >60 mL/min
Glucose, Bld: 102 mg/dL — ABNORMAL HIGH (ref 65–99)
Potassium: 4.1 mmol/L (ref 3.5–5.1)
Sodium: 142 mmol/L (ref 135–145)

## 2015-07-18 MED ORDER — LIDOCAINE HCL (PF) 1 % IJ SOLN
30.0000 mL | Freq: Once | INTRAMUSCULAR | Status: AC
  Administered 2015-07-18: 30 mL
  Filled 2015-07-18: qty 30

## 2015-07-18 NOTE — ED Provider Notes (Signed)
CSN: [id number]     Arrival date & time 07/18/15  4098 History   First MD Initiated Contact with Patient 07/18/15 [phone]     Chief Complaint  Patient presents with  . Ear Laceration     (Consider location/radiation/quality/duration/timing/severity/associated sxs/prior Treatment) HPI   Leah Figueroa is a 81 y.o with a pmhx of CVA with residual left-sided weakness, MI who presents to the emergency department today to be evaluated after a fall. Patient states that she was getting out of bed this morning when she didn't treat for her walker and slipped and fell onto her left side. Patient states that she hit the side of her head on a nightstand and cut her left ear. Denies loss of consciousness or headache. Patient's daughter brought her immediately to the emergency department. Patient is on Plavix. Denies confusion, blurry vision, headache, chest pain, shortness of breath, dizziness. No other trauma or injury.  Past Medical History  Diagnosis Date  . Other specified personal history presenting hazards to health(V15.89)   . Personal history of arthritis   . Unspecified personal history presenting hazards to health   . Cramp of limb   . Acute myocardial infarction of other anterior wall, episode of care unspecified   . Personal history of malignant neoplasm of breast   . Unspecified essential hypertension   . Personal history of other diseases of digestive system   . Unspecified hypothyroidism   . Other and unspecified hyperlipidemia   . Stroke [facility]) 03/2015    "left sided issues"   Past Surgical History  Procedure Laterality Date  . Right thyroid lobectomy    . Cataract extraction      left-2006, right 2011  . Intrathoracic goiter resection    . Cholecystectomy    . Lumpectomy for breast cancer Left 80's    radiation therapy  . Back surgery     Family History  Problem Relation Age of Onset  . Colon cancer Mother   . Coronary artery disease Brother   . Breast cancer Mother     Social History  Substance Use Topics  . Smoking status: Never Smoker   . Smokeless tobacco: Never Used  . Alcohol Use: No   OB History    No data available     Review of Systems  All other systems reviewed and are negative.     Allergies  Review of patient's allergies indicates no known allergies.  Home Medications   Prior to Admission medications   Medication Sig Start Date End Date Taking? Authorizing Provider  amLODipine (NORVASC) 2.5 MG tablet Take 5 mg by mouth daily.  02/18/15  Yes Historical Provider, MD  atorvastatin (LIPITOR) 10 MG tablet Take 10 mg by mouth daily.   Yes Historical Provider, MD  cholecalciferol (VITAMIN D) 400 UNITS TABS tablet Take 400 Units by mouth daily.   Yes Historical Provider, MD  clopidogrel (PLAVIX) 75 MG tablet Take 1 tablet (75 mg total) by mouth daily. 04/07/15  Yes Evlyn Kanner Love, PA-C  ferrous sulfate 325 (65 FE) MG tablet Take 325 mg by mouth daily with breakfast.   Yes Historical Provider, MD  levothyroxine (SYNTHROID, LEVOTHROID) 25 MCG tablet Take 1 tablet by mouth  daily 12/25/14  Yes Aleksei Plotnikov V, MD  metoprolol (LOPRESSOR) 50 MG tablet Take 1 tablet by mouth two  times daily 12/25/14  Yes Aleksei Plotnikov V, MD  polyethylene glycol (MIRALAX / GLYCOLAX) packet Take 17 g by mouth every evening.   Yes Historical Provider, MD  vitamin B-12 (CYANOCOBALAMIN) 1000 MCG tablet Take 1,000 mcg by mouth daily.   Yes Historical Provider, MD  vitamin B-12 (CYANOCOBALAMIN) 1000 MCG tablet Take 1,000 mcg by mouth daily.   Yes Historical Provider, MD  acetaminophen (TYLENOL) 325 MG tablet Take 2 tablets (650 mg total) by mouth every 4 (four) hours as needed for mild pain. Patient not taking: Reported on 07/18/2015 04/07/15   Evlyn Kanner Love, PA-C  diclofenac sodium (VOLTAREN) 1 % GEL Apply 2 g topically 3 (three) times daily. To left wrist Patient not taking: Reported on 07/18/2015 04/07/15   Evlyn Kanner Love, PA-C  docusate sodium (COLACE) 100 MG  capsule Take 100 mg by mouth daily.    Historical Provider, MD  ketotifen (ZADITOR) 0.025 % ophthalmic solution Place 1 drop into both eyes 2 (two) times daily. Patient not taking: Reported on 07/18/2015 04/07/15   Evlyn Kanner Love, PA-C   BP 160/90 mmHg  Pulse 63  Temp(Src) 97.9 F (36.6 C) (Oral)  Resp 16  Ht 5' (1.524 m)  Wt 46.72 kg  BMI 20.12 kg/m2  SpO2 97% Physical Exam  Constitutional: She is oriented to person, place, and time. She appears well-developed and well-nourished. No distress.  HENT:  Head: Normocephalic.  Right Ear: No foreign bodies.  Left Ear: No foreign bodies.  Ears:  Mouth/Throat: Oropharynx is clear and moist. No oropharyngeal exudate.  2.5 cm linear superficial laceration to left ear. No foreign bodies seen.  Eyes: Conjunctivae and EOM are normal. Pupils are equal, round, and reactive to light. Right eye exhibits no discharge. Left eye exhibits no discharge. No scleral icterus.  Neck: Normal range of motion. Neck supple.  Cardiovascular: Normal rate, regular rhythm, normal heart sounds and intact distal pulses.  Exam reveals no gallop and no friction rub.   No murmur heard. Pulmonary/Chest: Effort normal and breath sounds normal. No respiratory distress. She has no wheezes. She has no rales. She exhibits no tenderness.  Abdominal: Soft. She exhibits no distension. There is no tenderness. There is no guarding.  Musculoskeletal: Normal range of motion. She exhibits no edema.  Lymphadenopathy:    She has no cervical adenopathy.  Neurological: She is alert and oriented to person, place, and time. No cranial nerve deficit. She exhibits normal muscle tone. Coordination normal.  Strength 5/5 in right upper and lower extremities. Strength 3-5 left upper and left lower extremity, residual weakness after CVA that occurred in August 2016. No sensory deficits. Normal finger to nose, normal heel-to-shin.   Skin: Skin is warm and dry. No rash noted. She is not diaphoretic.  No erythema. No pallor.  Psychiatric: She has a normal mood and affect. Her behavior is normal.  Nursing note and vitals reviewed.   ED Course  Procedures (including critical care time)  LACERATION REPAIR Performed by: Dub Mikes Authorized by: Dub Mikes Consent: Verbal consent obtained. Risks and benefits: risks, benefits and alternatives were discussed Consent given by: patient Patient identity confirmed: provided demographic data Prepped and Draped in normal sterile fashion Wound explored  Laceration Location: Left ear  Laceration Length: 2.5 cm  No Foreign Bodies seen or palpated  Anesthesia: local infiltration  Local anesthetic: lidocaine 1 % without epinephrine  Anesthetic total: 5 ml  Irrigation method: syringe Amount of cleaning: standard  Skin closure: Approximated   Number of sutures: 5   Technique: Simple interrupted   Patient tolerance: Patient tolerated the procedure well with no immediate complications.  Labs Review Labs Reviewed  BASIC METABOLIC PANEL -  Abnormal; Notable for the following:    Glucose, Bld 102 (*)    All other components within normal limits  CBC WITH DIFFERENTIAL/PLATELET - Abnormal; Notable for the following:    RDW 17.3 (*)    All other components within normal limits    Imaging Review Ct Head Wo Contrast  07/18/2015  CLINICAL DATA:  Pain following fall EXAM: CT HEAD WITHOUT CONTRAST TECHNIQUE: Contiguous axial images were obtained from the base of the skull through the vertex without intravenous contrast. COMPARISON:  Head CT March 22, 2015 and brain MRI March 24, 2015 FINDINGS: There is mild diffuse atrophy. There is no intracranial mass, hemorrhage, extra-axial fluid collection, or midline shift. There is evidence of an infarct in the right mid to anterior parietal lobe extending to the temporoparietal junction on the right, evolved from prior studies. There is evidence of a prior small infarct in the  mid right pons, unchanged. There is extensive small vessel disease throughout the centra semiovale bilaterally. There is small vessel disease in both external and internal capsules. No acute infarct is evident. Bony calvarium appears intact. The mastoid air cells are clear. There are no intraorbital lesions. There is leftward deviation of the nasal septum. IMPRESSION: Atrophy with prior infarcts and extensive small vessel disease. No acute infarct evident. No hemorrhage, mass, or extra-axial fluid collection. Leftward deviation of the nasal septum is noted. Electronically Signed   By: Bretta BangWilliam  Woodruff III M.D.   On: 07/18/2015 12:04   I have personally reviewed and evaluated these images and lab results as part of my medical decision-making.   EKG Interpretation None      MDM   Final diagnoses:  Laceration of ear, left, initial encounter    81 year old female with history of CVA presents after a mechanical fall. Patient struck the left side of her head on a nightstand with a resulting laceration to her left ear. Will CT head to rule out acute bleed and obtain basic labs. Patient is sitting comfortably in bed, appears well, no current complaints at this time.  CT head with no acute abnormality. All labs within normal limits.  Laceration repaired with 5 sutures. Auricular block performed. Skin approximated well. Pressure irrigation performed. I was able to see the bottom of the wound in a bloodless field. No foreign body seen. Laceration occurred < 8 hours prior to repair which was well tolerated. Pt has no co morbidities to effect normal wound healing. Discussed suture home care w pt and answered questions. Pt to f-u for wound check in 24-48 hours to make sure an auricular hematoma has not developed. Patient will also need sutures removed in 7-10 days. Pt is hemodynamically stable w no complaints prior to dc.  Patient was discussed with and seen by Dr. Criss AlvineGoldston who agrees with the treatment  plan.         Lester KinsmanSamantha Tripp CherokeeDowless, PA-C 07/18/15 1743  Pricilla LovelessScott Goldston, MD 07/21/15 [phone]

## 2015-07-18 NOTE — ED Notes (Signed)
Pt transported to and from CT scanner on stretcher with tech, tolerated well. 

## 2015-07-18 NOTE — ED Notes (Signed)
Family at bedside. 

## 2015-07-18 NOTE — ED Notes (Signed)
Pt's diaper was soaked with urine, pt changed and repositioned for comfort.

## 2015-07-18 NOTE — ED Notes (Signed)
Pt presents with laceration to L ear while getting out of bed this morning.   Pt was attempting to reach for her walker, slipped and fell onto nightstand (per EMS - pt unsure).  Pt denies LOC.

## 2015-07-18 NOTE — Discharge Instructions (Signed)
Laceration Care, Adult °A laceration is a cut that goes through all of the layers of the skin and into the tissue that is right under the skin. Some lacerations heal on their own. Others need to be closed with stitches (sutures), staples, skin adhesive strips, or skin glue. Proper laceration care minimizes the risk of infection and helps the laceration to heal better. °HOW TO CARE FOR YOUR LACERATION °If sutures or staples were used: °· Keep the wound clean and dry. °· If you were given a bandage (dressing), you should change it at least one time per day or as told by your health care provider. You should also change it if it becomes wet or dirty. °· Keep the wound completely dry for the first 24 hours or as told by your health care provider. After that time, you may shower or bathe. However, make sure that the wound is not soaked in water until after the sutures or staples have been removed. °· Clean the wound one time each day or as told by your health care provider: °¨ Wash the wound with soap and water. °¨ Rinse the wound with water to remove all soap. °¨ Pat the wound dry with a clean towel. Do not rub the wound. °· After cleaning the wound, apply a thin layer of antibiotic ointment as told by your health care provider. This will help to prevent infection and keep the dressing from sticking to the wound. °· Have the sutures or staples removed as told by your health care provider. °If skin adhesive strips were used: °· Keep the wound clean and dry. °· If you were given a bandage (dressing), you should change it at least one time per day or as told by your health care provider. You should also change it if it becomes dirty or wet. °· Do not get the skin adhesive strips wet. You may shower or bathe, but be careful to keep the wound dry. °· If the wound gets wet, pat it dry with a clean towel. Do not rub the wound. °· Skin adhesive strips fall off on their own. You may trim the strips as the wound heals. Do not  remove skin adhesive strips that are still stuck to the wound. They will fall off in time. °If skin glue was used: °· Try to keep the wound dry, but you may briefly wet it in the shower or bath. Do not soak the wound in water, such as by swimming. °· After you have showered or bathed, gently pat the wound dry with a clean towel. Do not rub the wound. °· Do not do any activities that will make you sweat heavily until the skin glue has fallen off on its own. °· Do not apply liquid, cream, or ointment medicine to the wound while the skin glue is in place. Using those may loosen the film before the wound has healed. °· If you were given a bandage (dressing), you should change it at least one time per day or as told by your health care provider. You should also change it if it becomes dirty or wet. °· If a dressing is placed over the wound, be careful not to apply tape directly over the skin glue. Doing that may cause the glue to be pulled off before the wound has healed. °· Do not pick at the glue. The skin glue usually remains in place for 5-10 days, then it falls off of the skin. °General Instructions °· Take over-the-counter and prescription   medicines only as told by your health care provider.  If you were prescribed an antibiotic medicine or ointment, take or apply it as told by your doctor. Do not stop using it even if your condition improves.  To help prevent scarring, make sure to cover your wound with sunscreen whenever you are outside after stitches are removed, after adhesive strips are removed, or when glue remains in place and the wound is healed. Make sure to wear a sunscreen of at least 30 SPF.  Do not scratch or pick at the wound.  Keep all follow-up visits as told by your health care provider. This is important.  Check your wound every day for signs of infection. Watch for:  Redness, swelling, or pain.  Fluid, blood, or pus.  Raise (elevate) the injured area above the level of your heart  while you are sitting or lying down, if possible. SEEK MEDICAL CARE IF:  You received a tetanus shot and you have swelling, severe pain, redness, or bleeding at the injection site.  You have a fever.  A wound that was closed breaks open.  You notice a bad smell coming from your wound or your dressing.  You notice something coming out of the wound, such as wood or glass.  Your pain is not controlled with medicine.  You have increased redness, swelling, or pain at the site of your wound.  You have fluid, blood, or pus coming from your wound.  You notice a change in the color of your skin near your wound.  You need to change the dressing frequently due to fluid, blood, or pus draining from the wound.  You develop a new rash.  You develop numbness around the wound. SEEK IMMEDIATE MEDICAL CARE IF:  You develop severe swelling around the wound.  Your pain suddenly increases and is severe.  You develop painful lumps near the wound or on skin that is anywhere on your body.  You have a red streak going away from your wound.  The wound is on your hand or foot and you cannot properly move a finger or toe.  The wound is on your hand or foot and you notice that your fingers or toes look pale or bluish.   This information is not intended to replace advice given to you by your health care provider. Make sure you discuss any questions you have with your health care provider.   Document Released: 08/02/2005 Document Revised: 12/17/2014 Document Reviewed: 07/29/2014 Elsevier Interactive Patient Education 2016 [employer]  Wound Check If you have a wound, it may take some time to heal. Eventually, a scar will form. The scar will also fade with time. It is important to take care of your wound while it is healing. This helps to protect your wound from infection.  HOW SHOULD I TAKE CARE OF MY WOUND AT HOME?  Some wounds are allowed to close on their own or are repaired at a later date.  There are many different ways to close and cover a wound, including stitches (sutures), skin glue, and adhesive strips. Follow your health care provider's instructions about:  Wound care.  Bandage (dressing) changes and removal.  Wound closure removal.  Take medicines only as directed by your health care provider.  Keep all follow-up visits as directed by your health care provider. This is important.  Do not take baths, swim, or use a hot tub until your health care provider approves. You may shower as directed by your health care  provider.  Keep your wound clean and dry. WHAT AFFECTS SCAR FORMATION? Scars affect each person differently. How your body scars depends on:  The location and size of your wound.  Traits that you inherited from your parents (genetic predisposition).  How you take care of your wound. Irritation and inflammation increase the amount of scar formation.  Sun exposure. This can darken a scar. WHEN SHOULD I CALL OR SEE MY HEALTH CARE PROVIDER? Call or see your health care provider if:  You have redness, swelling, or pain at your wound site.  You have fluid, blood, or pus coming from your wound.  You have muscle aches, chills, or a general ill feeling.  You notice a bad smell coming from the wound.  Your wound separates after the sutures, staples, or skin adhesive strips have been removed.  You have persistent nausea or vomiting.  You have a fever.  You are dizzy. WHEN SHOULD I CALL 911 OR GO TO THE EMERGENCY ROOM? Call 911 or go to the emergency room if:  You faint.  You have difficulty breathing.   This information is not intended to replace advice given to you by your health care provider. Make sure you discuss any questions you have with your health care provider.    Wound will need to be reassessed in 24-48 hours to evaluate for hematoma. Sutures will need to be removed in 7-10 days. Return to the emergency department if you experience  redness or swelling of the area, fever, chills, drainage from the wound. Keep wound clean and dry and intact. Apply antibiotic ointment daily. May wash with soap and water.

## 2015-07-21 DIAGNOSIS — M25512 Pain in left shoulder: Secondary | ICD-10-CM | POA: Diagnosis not present

## 2015-07-21 DIAGNOSIS — W19XXXA Unspecified fall, initial encounter: Secondary | ICD-10-CM | POA: Diagnosis not present

## 2015-07-21 DIAGNOSIS — I10 Essential (primary) hypertension: Secondary | ICD-10-CM | POA: Diagnosis not present

## 2015-07-22 ENCOUNTER — Ambulatory Visit: Payer: Medicare Other | Admitting: Physical Therapy

## 2015-07-22 ENCOUNTER — Ambulatory Visit: Payer: Medicare Other | Attending: Physical Medicine & Rehabilitation | Admitting: Occupational Therapy

## 2015-07-22 ENCOUNTER — Encounter: Payer: Self-pay | Admitting: Physical Therapy

## 2015-07-22 DIAGNOSIS — IMO0002 Reserved for concepts with insufficient information to code with codable children: Secondary | ICD-10-CM

## 2015-07-22 DIAGNOSIS — I6980 Unspecified sequelae of other cerebrovascular disease: Secondary | ICD-10-CM | POA: Insufficient documentation

## 2015-07-22 DIAGNOSIS — R2681 Unsteadiness on feet: Secondary | ICD-10-CM | POA: Insufficient documentation

## 2015-07-22 DIAGNOSIS — R6889 Other general symptoms and signs: Secondary | ICD-10-CM | POA: Diagnosis not present

## 2015-07-22 DIAGNOSIS — I69998 Other sequelae following unspecified cerebrovascular disease: Secondary | ICD-10-CM | POA: Diagnosis not present

## 2015-07-22 DIAGNOSIS — R293 Abnormal posture: Secondary | ICD-10-CM | POA: Diagnosis not present

## 2015-07-22 DIAGNOSIS — R29818 Other symptoms and signs involving the nervous system: Secondary | ICD-10-CM | POA: Diagnosis not present

## 2015-07-22 DIAGNOSIS — I69398 Other sequelae of cerebral infarction: Secondary | ICD-10-CM

## 2015-07-22 DIAGNOSIS — I69898 Other sequelae of other cerebrovascular disease: Secondary | ICD-10-CM | POA: Diagnosis not present

## 2015-07-22 DIAGNOSIS — M6289 Other specified disorders of muscle: Secondary | ICD-10-CM | POA: Diagnosis not present

## 2015-07-22 DIAGNOSIS — H539 Unspecified visual disturbance: Secondary | ICD-10-CM | POA: Insufficient documentation

## 2015-07-22 DIAGNOSIS — Z7409 Other reduced mobility: Secondary | ICD-10-CM | POA: Insufficient documentation

## 2015-07-22 DIAGNOSIS — R4189 Other symptoms and signs involving cognitive functions and awareness: Secondary | ICD-10-CM | POA: Diagnosis not present

## 2015-07-22 DIAGNOSIS — R269 Unspecified abnormalities of gait and mobility: Secondary | ICD-10-CM

## 2015-07-22 DIAGNOSIS — R531 Weakness: Secondary | ICD-10-CM | POA: Diagnosis not present

## 2015-07-22 DIAGNOSIS — R279 Unspecified lack of coordination: Secondary | ICD-10-CM | POA: Insufficient documentation

## 2015-07-22 DIAGNOSIS — R201 Hypoesthesia of skin: Secondary | ICD-10-CM | POA: Diagnosis not present

## 2015-07-22 DIAGNOSIS — R2689 Other abnormalities of gait and mobility: Secondary | ICD-10-CM

## 2015-07-22 DIAGNOSIS — R29898 Other symptoms and signs involving the musculoskeletal system: Secondary | ICD-10-CM

## 2015-07-22 NOTE — Therapy (Signed)
[facility] [address] [place], [place], [address] Phone: [phone]   Fax:  [phone]  Physical Therapy Evaluation  Patient Details  Name: Leah Figueroa MRN: [id number] Date of Birth: [birth year] Referring Provider: Claudette Laws, MD  Encounter Date: 07/22/2015      PT End of Session - 07/22/15 1145    Visit Number 1   Number of Visits 16   Date for PT Re-Evaluation 08/20/14   Authorization Type Medicare G-Code & progress note every 10 visits   PT Start Time 1102   PT Stop Time 1144   PT Time Calculation (min) 42 min   Equipment Utilized During Treatment Gait belt   Activity Tolerance Patient tolerated treatment well   Behavior During Therapy [facility] for tasks assessed/performed      Past Medical History  Diagnosis Date  . Other specified personal history presenting hazards to health(V15.89)   . Personal history of arthritis   . Unspecified personal history presenting hazards to health   . Cramp of limb   . Acute myocardial infarction of other anterior wall, episode of care unspecified   . Personal history of malignant neoplasm of breast   . Unspecified essential hypertension   . Personal history of other diseases of digestive system   . Unspecified hypothyroidism   . Other and unspecified hyperlipidemia   . Stroke [facility]) 03/2015    "left sided issues"    Past Surgical History  Procedure Laterality Date  . Right thyroid lobectomy    . Cataract extraction      left-2006, right 2011  . Intrathoracic goiter resection    . Cholecystectomy    . Lumpectomy for breast cancer Left 80's    radiation therapy  . Back surgery      There were no vitals filed for this visit.  Visit Diagnosis:  Abnormality of gait  Weakness due to cerebrovascular accident  Unsteadiness  Balance problems  Posture abnormality  Decreased functional activity tolerance      Subjective Assessment - 07/22/15 1110     Subjective This 81yo female was living alone independently including driving and working as Catering manager. She had a right MCA CVA on 03/22/2015 with inpatient rehab admission and discharge 04/08/2015 to [facility] SNF until 05/08/2015. She is living at home with paid 24hr 7 day/wk care. She saw Dr. Wynn Banker for follow-up visit on 07/01/2015 and was referred to PT / OT. Patient presents with her daughter for evaluation.    Patient is accompained by: Family member   Diagnostic tests She wants to be able to be left alone.    Currently in Pain? No/denies            [facility] PT Assessment - 07/22/15 1100    Assessment   Medical Diagnosis Right MCA CVA   Referring Provider Claudette Laws, MD   Onset Date/Surgical Date 03/22/15   Precautions   Precautions Fall   Restrictions   Weight Bearing Restrictions No   Balance Screen   Has the patient fallen in the past 6 months Yes   How many times? 2   Has the patient had a decrease in activity level because of a fear of falling?  Yes   Is the patient reluctant to leave their home because of a fear of falling?  Yes   Home Environment   Living Environment Private residence   Living Arrangements Alone  paying out of pocket for 24hr care   Type of Home House  Home Access Stairs to enter   Entrance Stairs-Number of Steps 1   Entrance Stairs-Rails None   Home Layout One level   Home Equipment Walker - 2 wheels;Walker - 4 wheels;Cane - single point;Shower seat;Grab bars - tub/shower;Bedside commode;Toilet riser   Prior Function   Level of Independence Independent;Independent with household mobility with device;Independent with community mobility with device;Independent with gait  household & community without device   Vocation Retired  was working as Catering manager prior to CVA   Leisure going to church   Observation/Other Assessments   Focus on Therapeutic Outcomes (FOTO)  65 Functional Status  46 Risk Adjusted FS   Stroke Impact Scale  63.9%   Fear  Avoidance Belief Questionnaire (FABQ)  47 (13)   Posture/Postural Control   Posture/Postural Control Postural limitations   Postural Limitations Rounded Shoulders;Forward head;Increased thoracic kyphosis;Flexed trunk   ROM / Strength   AROM / PROM / Strength AROM;Strength   AROM   Overall AROM  Within functional limits for tasks performed   Strength   Overall Strength Deficits   Strength Assessment Site Hip;Knee;Ankle   Right/Left Hip Left;Right   Right Hip Flexion 4/5   Right Hip Extension --  appears 3/5   Right Hip ABduction --  appears 3/5   Left Hip Flexion 4-/5   Left Hip Extension --  appears <3/5   Left Hip ABduction --  appears <3/5   Right/Left Knee Right;Left   Right Knee Flexion --  appears 3/5   Right Knee Extension 4/5   Left Knee Flexion --  appears <3/5   Left Knee Extension 4-/5   Right/Left Ankle Right;Left   Right Ankle Dorsiflexion 4/5   Left Ankle Dorsiflexion 3/5   Transfers   Transfers Sit to Stand;Stand to Sit   Sit to Stand 5: Supervision;With upper extremity assist;With armrests;From chair/3-in-1  requires UE touch to stabilize   Stand to Sit 5: Supervision;With upper extremity assist;With armrests;To chair/3-in-1  requires UE touch to stabilize   Ambulation/Gait   Ambulation/Gait Yes   Ambulation/Gait Assistance 5: Supervision;4: Min assist  SBA rollator; MinA no device TUG   Ambulation Distance (Feet) 175 Feet   Assistive device Rollator   Gait Pattern Step-through pattern;Decreased step length - left;Decreased stride length;Decreased dorsiflexion - left;Trunk flexed;Narrow base of support   Ambulation Surface Indoor;Level   Gait velocity 1.72 ft/sec   <1.8 ft/sec   Stairs Yes   Stairs Assistance 4: Min assist   Stair Management Technique One rail Right;Alternating pattern;Forwards   Number of Stairs 4   Ramp 4: Min assist  rollator with unsafe technique   Curb 4: Min assist  rollator with unsafe technique   Standardized Balance  Assessment   Standardized Balance Assessment Berg Balance Test;Timed Up and Go Test   Berg Balance Test   Sit to Stand Able to stand  independently using hands   Standing Unsupported Able to stand 2 minutes with supervision   Sitting with Back Unsupported but Feet Supported on Floor or Stool Able to sit safely and securely 2 minutes   Stand to Sit Controls descent by using hands   Transfers Able to transfer safely, definite need of hands   Standing Unsupported with Eyes Closed Able to stand 10 seconds with supervision   Standing Ubsupported with Feet Together Able to place feet together independently but unable to hold for 30 seconds   From Standing, Reach Forward with Outstretched Arm Reaches forward but needs supervision   From Standing Position, Pick up Object  from Floor Able to pick up shoe, needs supervision   From Standing Position, Turn to Look Behind Over each Shoulder Turn sideways only but maintains balance   Turn 360 Degrees Needs close supervision or verbal cueing   Standing Unsupported, Alternately Place Feet on Step/Stool Needs assistance to keep from falling or unable to try   Standing Unsupported, One Foot in [employer] Loses balance while stepping or standing   Standing on One Leg Tries to lift leg/unable to hold 3 seconds but remains standing independently   Total Score 29   Timed Up and Go Test   Normal TUG (seconds) 19.87  19.87 with rollator, 20.98 without device minA                             PT Short Term Goals - 07/22/15 1145    PT SHORT TERM GOAL #1   Title Patient demonstrates understanding of inital HEP. (Target Date: 08/20/2014)   Time 1   Period Months   Status New   PT SHORT TERM GOAL #2   Title Timed Up & Go without device with supervision <18sec. (Target Date: 08/20/2014)   Time 1   Period Months   Status New   PT SHORT TERM GOAL #3   Title Paitent ambulates 300' with rollator walker with supervision. (Target Date: 08/20/2014)   Time 1    Period Months   Status New           PT Long Term Goals - 07/22/15 1145    PT LONG TERM GOAL #1   Title Berg Balance >45/56 to indicate lower fall risk (Target Date: 09/18/2014)   Time 2   Period Months   Status New   PT LONG TERM GOAL #2   Title Timed Up & Go without device safely <15 sec. (Target Date: 09/18/2014)   Time 2   Period Months   Status New   PT LONG TERM GOAL #3   Title Patient ambulates 400' with rollator walker with supervision for community mobility. (Target Date: 09/18/2014)   Time 2   Period Months   Status New   PT LONG TERM GOAL #4   Title Patient ambulates 100' around furniture with LRAD modified independent. (Target Date: 09/18/2014)   Time 2   Status New   PT LONG TERM GOAL #5   Title Patient negotiates ramps, curbs with rollator walker and stairs with 1 rail with supervision. 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 She was discharged from rehab SNF 05/08/2015 to home with 24hr supervision. PT testing reveals LE weakness R>L, Berg Balance 29/56 (<45/56 indicates fall risk), Timed Up & Go 19.87sec (>13.5sec indicates fall risk) and Gait Velocity of 1.6372ft/sec indicates fall risk. Her gait is dependent on rollator walker with supervision. She is dependent on ramps & curbs with unsafe technique. She reports ambulating without rollator in her home & TUG without device required minA  with 20.98sec time. Patient would benefit from skilled PT to progress mobility & safety.    Pt will benefit from skilled therapeutic intervention in order to improve on the following deficits Abnormal gait;Decreased activity tolerance;Decreased balance;Decreased endurance;Decreased mobility;Decreased strength;Postural dysfunction;Decreased safety  awareness   Rehab Potential  Good   PT Frequency 2x / week   PT Duration 8 weeks   PT Treatment/Interventions ADLs/[facility];DME Instruction;Gait training;Stair training;Functional mobility training;Therapeutic activities;Therapeutic exercise;Balance training;Neuromuscular re-education;Patient/family education   PT Next Visit Plan Fall prevention strategies, HEP for balance & strength   Consulted and Agree with Plan of Care Patient          G-Codes - Aug 09, 2015 1145    Functional Assessment Tool Used Berg Balance 29/56   Functional Limitation Mobility: Walking and moving around   Mobility: Walking and Moving Around Current Status [phone]) At least 60 percent but less than 80 percent impaired, limited or restricted   Mobility: Walking and Moving Around Goal Status [phone]) At least 40 percent but less than 60 percent impaired, limited or restricted       Problem List Patient Active Problem List   Diagnosis Date Noted  . Left-sided neglect 07/01/2015  . History of right MCA stroke 07/01/2015  . Altered sensation due to stroke 04/29/2015  . Cognitive deficit, post-stroke 04/29/2015  . Pseudogout of left wrist 03/28/2015  . Hyperlipidemia LDL goal <70 03/26/2015  . Hyperglycemia 03/26/2015  . 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 Embolic stroke (HCC)   . Left hemiplegia (HCC)   . Homonymous hemianopia   . Chest wall trauma 01/11/2014  . Injury of left lower arm 01/11/2014  . Headache(784.0) 01/04/2013  . Allergic rhinitis, cause unspecified 01/04/2013  . Anemia 04/19/2012  . Epistaxis, recurrent 04/19/2012  . Hypothyroidism 01/01/2008  . Essential hypertension 01/01/2008  . HYPERTENSION 01/01/2008  . MYOCARDIAL INFARCTION, ACUTE, ANTERIOR WALL 01/01/2008  . LEG CRAMPS 01/01/2008  . ADENOCARCINOMA, BREAST, HX OF 01/01/2008  . DIVERTICULOSIS, COLON, HX OF 01/01/2008  . ARTHRITIS, HX OF 01/01/2008  . RADIATION THERAPY, HX OF 01/01/2008  . CATARACT  EXTRACTION, HX OF 01/01/2008  . CHOLECYSTECTOMY, LAPAROSCOPIC, HX OF 01/01/2008    Vladimir Faster PT, DPT Aug 09, 2015, 1:12 PM  [facility] [address] [place], [place], [address] Phone: [phone]   Fax:  [phone]  Name: Remedy Corporan Verhoeven MRN: [id number] Date of Birth: 23-Apr-1923

## 2015-07-22 NOTE — Therapy (Signed)
[facility] [address] [place], [place], [address] Phone: [phone]   Fax:  [phone]  Occupational Therapy Evaluation  Patient Details  Name: Leah Figueroa MRN: [id number] Date of Birth: [birth year] Referring Provider: Dr. Claudette Laws  Encounter Date: 07/22/2015      OT End of Session - 07/22/15 1341    Visit Number 1   Number of Visits 17   Date for OT Re-Evaluation 09/20/15   Authorization Type MCR - G code needed   Authorization - Visit Number 1   Authorization - Number of Visits 10   OT Start Time 1145   OT Stop Time 1230   OT Time Calculation (min) 45 min   Activity Tolerance Patient tolerated treatment well      Past Medical History  Diagnosis Date  . Other specified personal history presenting hazards to health(V15.89)   . Personal history of arthritis   . Unspecified personal history presenting hazards to health   . Cramp of limb   . Acute myocardial infarction of other anterior wall, episode of care unspecified   . Personal history of malignant neoplasm of breast   . Unspecified essential hypertension   . Personal history of other diseases of digestive system   . Unspecified hypothyroidism   . Other and unspecified hyperlipidemia   . Stroke [facility]) 03/2015    "left sided issues"    Past Surgical History  Procedure Laterality Date  . Right thyroid lobectomy    . Cataract extraction      left-2006, right 2011  . Intrathoracic goiter resection    . Cholecystectomy    . Lumpectomy for breast cancer Left 80's    radiation therapy  . Back surgery      There were no vitals filed for this visit.  Visit Diagnosis:  Vision disturbance following cerebrovascular accident - Plan: Ot plan of care cert/re-cert  Lack of coordination due to stroke - Plan: Ot plan of care cert/re-cert  Impaired sensation - Plan: Ot plan of care cert/re-cert  Weakness of left hand - Plan: Ot plan of care  cert/re-cert  Decreased functional mobility and endurance - Plan: Ot plan of care cert/re-cert  Impaired cognition - Plan: Ot plan of care cert/re-cert      Subjective Assessment - 07/22/15 1151    Subjective  I'm doing pretty good   Pertinent History back surgery, breast CA   Limitations HOH   Patient Stated Goals to drop less from Lt hand   Currently in Pain? No/denies           [facility] OT Assessment - 07/22/15 1153    Assessment   Diagnosis Rt MCA CVA   Referring Provider Dr. Claudette Laws   Onset Date 03/22/15   Prior Therapy inpatient rehab, SNF at [facility], home health therapy   Precautions   Precautions Fall  no driving   Balance Screen   Has the patient fallen in the past 6 months Yes   How many times? 2   Has the patient had a decrease in activity level because of a fear of falling?  No   Is the patient reluctant to leave their home because of a fear of falling?  No   Home  Environment   Bathroom Shower/Tub Tub/Shower unit;Curtain  but also has walk in shower with door   Home Equipment Shower seat;Grab bars - tub/shower;Bedside commode  rollator    Additional Comments Pt has 24 hr. care, 7 days a  week since home from [facility]. Daughter lives in [place]   Lives With Alone  in 1 story home, 1 step to enter   Prior Function   Level of Independence Independent  and driving prior to CVA   Vocation Retired  bookkeeper prior to CVA   Leisure going to church   ADL   ADL comments Assist to cut food, but eating with Rt hand. Grooming with Rt hand and occasional min assist for brushing hair. Min assist for hooking bra and donning/doffing coat. Wears elastic pants and slip on or velcro shoes. Dependent for all cooking and cleaning at this time.    Mobility   Mobility Status --  using rollator at this time, but I prior to CVA   Written Expression   Dominant Hand Right   Vision - History   Baseline Vision Legally blind  Rt eye (prior to CVA), wore glasses    Visual History Cataracts  removed from both eyes   Vision Assessment   Ocular Range of Motion Restricted on looking up   Tracking/Visual Pursuits Decreased smoothness of horizontal tracking   Visual Fields Left visual field deficit   Comment Pt has Lt inattention   Cognition   Overall Cognitive Status Cognition to be further assessed in functional context PRN   Observation/Other Assessments   Observations Pt is hard of hearing   Sensation   Light Touch Impaired Detail   Light Touch Impaired Details Impaired LUE   Additional Comments inconsistent with detecting stimulus and unable to localize stimulus Lt hand   Coordination   9 Hole Peg Test Right;Left   Right 9 Hole Peg Test 39.13 sec   Left 9 Hole Peg Test 93.37 sec.    Edema   Edema none   ROM / Strength   AROM / PROM / Strength AROM   AROM   Overall AROM  Within functional limits for tasks performed   Overall AROM Comments BUE AROM WFL's   Hand Function   Right Hand Grip (lbs) 34 lbs   Left Hand Grip (lbs) 19 lbs                           OT Short Term Goals - 07/22/15 1350    OT SHORT TERM GOAL #1   Title Indepndent with HEP for Lt hand coordination and strength (all STG's due 08/21/15)    Time 4   Period Weeks   Status New   OT SHORT TERM GOAL #2   Title Pt to verbalize understanding with visual strategies for Lt inattention and safety considerations with impaired sensation LUE   Time 4   Period Weeks   Status New   OT SHORT TERM GOAL #3   Title Indpependent with all BADLS   Time 4   Period Weeks   Status New   OT SHORT TERM GOAL #4   Title Improve grip strength Lt hand to 25 lbs or greater to assist with opening containers   Baseline Lt = 19 (Rt = 34 lbs)    Time 4   Period Weeks   Status New   OT SHORT TERM GOAL #5   Title Improve coordination as evidenced by performing 9 hole peg test in 75 sec. or less Lt hand   Baseline 93.37 sec.    Time 4   Period Weeks   Status New            OT Long Term Goals -  07/22/15 1353    OT LONG TERM GOAL #1   Title Independent with updated HEP (ALL LTG's due 09/20/15)   Time 8   Period Weeks   Status New   OT LONG TERM GOAL #2   Title Independent with simple cold meal prep and microwaveable items   Baseline dependent   Time 8   Period Weeks   Status New   OT LONG TERM GOAL #3   Title Improve coordination as evidenced by performing 9 hole peg test in 60 sec. or under Lt hand   Baseline 93.37 sec   Time 8   Period Weeks   Status New   OT LONG TERM GOAL #4   Title Pt to perform enviornmental scanning with 90% accuracy or greater   Time 8   Period Weeks   Status New   OT LONG TERM GOAL #5   Title Pt to return to simple financial management and medication management with supervision/cueing prn for accuracy/safety   Time 8   Period Weeks   Status New               Plan - 07/22/15 1343    Clinical Impression Statement Pt is a 81 y.o. female who presents to outpatient rehab s/p Rt MCA CVA  on 03/22/15. Pt received short inpatient rehab, [facility]. Pt is also HOH and legally blind Rt eye. Pt presents with Lt hemiparesis with decreased coordination, grip strength, sensation LUE, Lt field cut and Lt inattention.    Pt will benefit from skilled therapeutic intervention in order to improve on the following deficits (Retired) Decreased coordination;Decreased endurance;Decreased Engineer, building servicessafety awareness;Impaired sensation;Decreased knowledge of precautions;Decreased activity tolerance;Decreased balance;Decreased knowledge of use of DME;Impaired UE functional use;Decreased cognition;Decreased mobility;Decreased strength;Impaired perceived functional ability;Impaired vision/preception;Pain   OT Frequency 2x / week   OT Duration 8 weeks  PLUS EVALUATION   OT Treatment/Interventions Truszkowski-care/ADL training;Therapeutic exercise;Functional Mobility Training;Patient/family  education;Neuromuscular education;Manual Therapy;Energy conservation;Therapeutic exercises;DME and/or AE instruction;Therapeutic activities;Cognitive remediation/compensation;Passive range of motion;Visual/perceptual remediation/compensation;Moist Heat   Plan Coordination and putty HEP, Visual strategies for Lt inattention and safety considerations with Lt sensory impairments   Consulted and Agree with Plan of Care Patient          G-Codes - 07/22/15 1357    Functional Assessment Tool Used LUE: 9 hole peg = 93.37 sec., grip strength = 19 lbs, significantly impaired sensation   Functional Limitation Carrying, moving and handling objects   Carrying, Moving and Handling Objects Current Status ([id number]) At least 60 percent but less than 80 percent impaired, limited or restricted   Carrying, Moving and Handling Objects Goal Status (U0454(G8985) At least 20 percent but less than 40 percent impaired, limited or restricted      Problem List Patient Active Problem List   Diagnosis Date Noted  . Left-sided neglect 07/01/2015  . History of right MCA stroke 07/01/2015  . Altered sensation due to stroke 04/29/2015  . Cognitive deficit, post-stroke 04/29/2015  . Pseudogout of left wrist 03/28/2015  . Hyperlipidemia LDL goal <70 03/26/2015  . Hyperglycemia 03/26/2015  . 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 Embolic stroke (HCC)   . Left hemiplegia (HCC)   . Homonymous hemianopia   . Chest wall trauma 01/11/2014  . Injury of left lower arm 01/11/2014  . Headache(784.0) 01/04/2013  . Allergic rhinitis, cause unspecified 01/04/2013  . Anemia 04/19/2012  . Epistaxis, recurrent 04/19/2012  . Hypothyroidism  01/01/2008  . Essential hypertension 01/01/2008  . HYPERTENSION 01/01/2008  . MYOCARDIAL INFARCTION, ACUTE, ANTERIOR WALL 01/01/2008  . LEG CRAMPS 01/01/2008  . ADENOCARCINOMA, BREAST, HX OF 01/01/2008  . DIVERTICULOSIS, COLON, HX OF 01/01/2008  . ARTHRITIS, HX  OF 01/01/2008  . RADIATION THERAPY, HX OF 01/01/2008  . CATARACT EXTRACTION, HX OF 01/01/2008  . CHOLECYSTECTOMY, LAPAROSCOPIC, HX OF 01/01/2008    Kelli Churn, OTR/L 07/22/2015, 2:00 PM  [facility] [address] [place], [place], [address] Phone: [phone]   Fax:  [phone]  Name: Leah Figueroa MRN: [id number] Date of Birth: 05/04/1923

## 2015-07-24 ENCOUNTER — Encounter: Payer: Self-pay | Admitting: Physical Therapy

## 2015-07-24 ENCOUNTER — Ambulatory Visit: Payer: Medicare Other | Admitting: Physical Therapy

## 2015-07-24 DIAGNOSIS — I69998 Other sequelae following unspecified cerebrovascular disease: Secondary | ICD-10-CM | POA: Diagnosis not present

## 2015-07-24 DIAGNOSIS — R6889 Other general symptoms and signs: Secondary | ICD-10-CM

## 2015-07-24 DIAGNOSIS — I6980 Unspecified sequelae of other cerebrovascular disease: Secondary | ICD-10-CM | POA: Diagnosis not present

## 2015-07-24 DIAGNOSIS — R293 Abnormal posture: Secondary | ICD-10-CM

## 2015-07-24 DIAGNOSIS — R201 Hypoesthesia of skin: Secondary | ICD-10-CM | POA: Diagnosis not present

## 2015-07-24 DIAGNOSIS — R2681 Unsteadiness on feet: Secondary | ICD-10-CM

## 2015-07-24 DIAGNOSIS — Z4802 Encounter for removal of sutures: Secondary | ICD-10-CM | POA: Diagnosis not present

## 2015-07-24 DIAGNOSIS — IMO0002 Reserved for concepts with insufficient information to code with codable children: Secondary | ICD-10-CM

## 2015-07-24 DIAGNOSIS — R2689 Other abnormalities of gait and mobility: Secondary | ICD-10-CM

## 2015-07-24 DIAGNOSIS — R279 Unspecified lack of coordination: Secondary | ICD-10-CM | POA: Diagnosis not present

## 2015-07-24 DIAGNOSIS — R269 Unspecified abnormalities of gait and mobility: Secondary | ICD-10-CM

## 2015-07-24 DIAGNOSIS — H539 Unspecified visual disturbance: Secondary | ICD-10-CM | POA: Diagnosis not present

## 2015-07-24 DIAGNOSIS — M6289 Other specified disorders of muscle: Secondary | ICD-10-CM | POA: Diagnosis not present

## 2015-07-24 NOTE — Therapy (Signed)
[facility] [address] [place], [place], [address] Phone: [phone]   Fax:  [phone]  Physical Therapy Treatment  Patient Details  Name: Leah Figueroa MRN: [id number] Date of Birth: [birth year] Referring Provider: Claudette Laws, MD  Encounter Date: 07/24/2015      PT End of Session - 07/24/15 1500    Visit Number 2   Number of Visits 16   Date for PT Re-Evaluation 08/20/14   Authorization Type Medicare G-Code & progress note every 10 visits   PT Start Time 1402   PT Stop Time 1445   PT Time Calculation (min) 43 min   Equipment Utilized During Treatment Gait belt   Activity Tolerance Patient tolerated treatment well   Behavior During Therapy [facility] for tasks assessed/performed      Past Medical History  Diagnosis Date  . Other specified personal history presenting hazards to health(V15.89)   . Personal history of arthritis   . Unspecified personal history presenting hazards to health   . Cramp of limb   . Acute myocardial infarction of other anterior wall, episode of care unspecified   . Personal history of malignant neoplasm of breast   . Unspecified essential hypertension   . Personal history of other diseases of digestive system   . Unspecified hypothyroidism   . Other and unspecified hyperlipidemia   . Stroke [facility]) 03/2015    "left sided issues"    Past Surgical History  Procedure Laterality Date  . Right thyroid lobectomy    . Cataract extraction      left-2006, right 2011  . Intrathoracic goiter resection    . Cholecystectomy    . Lumpectomy for breast cancer Left 80's    radiation therapy  . Back surgery      There were no vitals filed for this visit.  Visit Diagnosis:  Abnormality of gait  Weakness due to cerebrovascular accident  Unsteadiness  Balance problems  Posture abnormality  Decreased functional activity tolerance                             Balance Exercises - 07/24/15 1456    OTAGO PROGRAM   Head Movements Sitting;5 reps   Neck Movements Sitting;5 reps  needs further instruction due to apraxic movement   Back Extension Standing;5 reps  with walker support   Trunk Movements Standing;5 reps  with single UE support on walker   Ankle Movements 10 reps;Sitting   Knee Extensor 10 reps  no weight   Knee Flexor 10 reps  No weight; holding walker   Hip ABductor 10 reps  No weight; holding walker   Ankle Plantorflexors 20 reps, support  hold walker   Ankle Dorsiflexors 20 reps, support  holds walker   Knee Bends 10 reps, support  holds walker; no weight           PT Education - 07/24/15 1453    Education provided Yes   Education Details Fall prevention strategies in home, [place] HEP initiated   [employer]) Educated Patient;Child(ren)   Methods Explanation;Demonstration;Tactile cues;Verbal cues;Handout   Comprehension Verbalized understanding;Returned demonstration;Tactile cues required;Verbal cues required;Need further instruction          PT Short Term Goals - 07/22/15 1145    PT SHORT TERM GOAL #1   Title Patient demonstrates understanding of inital HEP. (Target Date: 08/20/2014)   Time 1   Period Months   Status New   PT SHORT  TERM GOAL #2   Title Timed Up & Go without device with supervision <18sec. (Target Date: 08/20/2014)   Time 1   Period Months   Status New   PT SHORT TERM GOAL #3   Title Paitent ambulates 300' with rollator walker with supervision. (Target Date: 08/20/2014)   Time 1   Period Months   Status New           PT Long Term Goals - 07/22/15 1145    PT LONG TERM GOAL #1   Title Berg Balance >45/56 to indicate lower fall risk (Target Date: 09/18/2014)   Time 2   Period Months   Status New   PT LONG TERM GOAL #2   Title Timed Up & Go without device safely <15 sec. (Target Date: 09/18/2014)   Time 2   Period Months   Status New   PT LONG TERM GOAL #3   Title Patient ambulates 400'  with rollator walker with supervision for community mobility. (Target Date: 09/18/2014)   Time 2   Period Months   Status New   PT LONG TERM GOAL #4   Title Patient ambulates 100' around furniture with LRAD modified independent. (Target Date: 09/18/2014)   Time 2   Status New   PT LONG TERM GOAL #5   Title Patient negotiates ramps, curbs with rollator walker and stairs with 1 rail with supervision. (Target Date: 09/18/2014)   Time 2   Period Months   Status New               Plan - 07/24/15 1501    Clinical Impression Statement Patient's daughter verbalized understanding of fall prevention strategies. Patient needs additional instruction for OTAGO program on first half covered today and PT plans to add rest of program over the next week. OTAGO program challenged her balance & strength.   Pt will benefit from skilled therapeutic intervention in order to improve on the following deficits Abnormal gait;Decreased activity tolerance;Decreased balance;Decreased endurance;Decreased mobility;Decreased strength;Postural dysfunction;Decreased safety awareness   Rehab Potential Good   PT Frequency 2x / week   PT Duration 8 weeks   PT Treatment/Interventions ADLs/[facility];DME Instruction;Gait training;Stair training;Functional mobility training;Therapeutic activities;Therapeutic exercise;Balance training;Neuromuscular re-education;Patient/family education   PT Next Visit Plan review OTAGO exercises already instructed and remainder of program.    Consulted and Agree with Plan of Care Patient        Problem List Patient Active Problem List   Diagnosis Date Noted  . Left-sided neglect 07/01/2015  . History of right MCA stroke 07/01/2015  . Altered sensation due to stroke 04/29/2015  . Cognitive deficit, post-stroke 04/29/2015  . Pseudogout of left wrist 03/28/2015  . Hyperlipidemia LDL goal <70 03/26/2015  . Hyperglycemia 03/26/2015  . Thrombotic stroke involving right  middle cerebral artery (HCC) 03/26/2015  . Embolic stroke (HCC)   . Left hemiplegia (HCC)   . Homonymous hemianopia   . Chest wall trauma 01/11/2014  . Injury of left lower arm 01/11/2014  . Headache(784.0) 01/04/2013  . Allergic rhinitis, cause unspecified 01/04/2013  . Anemia 04/19/2012  . Epistaxis, recurrent 04/19/2012  . Hypothyroidism 01/01/2008  . Essential hypertension 01/01/2008  . HYPERTENSION 01/01/2008  . MYOCARDIAL INFARCTION, ACUTE, ANTERIOR WALL 01/01/2008  . LEG CRAMPS 01/01/2008  . ADENOCARCINOMA, BREAST, HX OF 01/01/2008  . DIVERTICULOSIS, COLON, HX OF 01/01/2008  . ARTHRITIS, HX OF 01/01/2008  . RADIATION THERAPY, HX OF 01/01/2008  . CATARACT EXTRACTION, HX OF 01/01/2008  . CHOLECYSTECTOMY, LAPAROSCOPIC, HX OF 01/01/2008  Vladimir Faster PT, DPT 07/24/2015, 3:07 PM  [facility] [address] [place], [place], [address] Phone: [phone]   Fax:  [phone]  Name: Leah Figueroa MRN: [id number] Date of Birth: 02-21-1923

## 2015-07-24 NOTE — Patient Instructions (Signed)
Fall Prevention in the Home  Falls can cause injuries and can affect people from all age groups. There are many simple things that you can do to make your home safe and to help prevent falls. WHAT CAN I DO ON THE OUTSIDE OF MY HOME?  Regularly repair the edges of walkways and driveways and fix any cracks.  Remove high doorway thresholds.  Trim any shrubbery on the main path into your home.  Use bright outdoor lighting.  Clear walkways of debris and clutter, including tools and rocks.  Regularly check that handrails are securely fastened and in good repair. Both sides of any steps should have handrails.  Install guardrails along the edges of any raised decks or porches.  Have leaves, snow, and ice cleared regularly.  Use sand or salt on walkways during winter months.  In the garage, clean up any spills right away, including grease or oil spills. WHAT CAN I DO IN THE BATHROOM?  Use night lights.  Install grab bars by the toilet and in the tub and shower. Do not use towel bars as grab bars.  Use non-skid mats or decals on the floor of the tub or shower.  If you need to sit down while you are in the shower, use a plastic, non-slip stool..  Keep the floor dry. Immediately clean up any water that spills on the floor.  Remove soap buildup in the tub or shower on a regular basis.  Attach bath mats securely with double-sided non-slip rug tape.  Remove throw rugs and other tripping hazards from the floor. WHAT CAN I DO IN THE BEDROOM?  Use night lights.  Make sure that a bedside light is easy to reach.  Do not use oversized bedding that drapes onto the floor.  Have a firm chair that has side arms to use for getting dressed.  Remove throw rugs and other tripping hazards from the floor. WHAT CAN I DO IN THE KITCHEN?   Clean up any spills right away.  Avoid walking on wet floors.  Place frequently used items in easy-to-reach places.  If you need to reach for something  above you, use a sturdy step stool that has a grab bar.  Keep electrical cables out of the way.  Do not use floor polish or wax that makes floors slippery. If you have to use wax, make sure that it is non-skid floor wax.  Remove throw rugs and other tripping hazards from the floor. WHAT CAN I DO IN THE STAIRWAYS?  Do not leave any items on the stairs.  Make sure that there are handrails on both sides of the stairs. Fix handrails that are broken or loose. Make sure that handrails are as long as the stairways.  Check any carpeting to make sure that it is firmly attached to the stairs. Fix any carpet that is loose or worn.  Avoid having throw rugs at the top or bottom of stairways, or secure the rugs with carpet tape to prevent them from moving.  Make sure that you have a light switch at the top of the stairs and the bottom of the stairs. If you do not have them, have them installed. WHAT ARE SOME OTHER FALL PREVENTION TIPS?  Wear closed-toe shoes that fit well and support your feet. Wear shoes that have rubber soles or low heels.  When you use a stepladder, make sure that it is completely opened and that the sides are firmly locked. Have someone hold the ladder while you   are using it. Do not climb a closed stepladder.  Add color or contrast paint or tape to grab bars and handrails in your home. Place contrasting color strips on the first and last steps.  Use mobility aids as needed, such as canes, walkers, scooters, and crutches.  Turn on lights if it is dark. Replace any light bulbs that burn out.  Set up furniture so that there are clear paths. Keep the furniture in the same spot.  Fix any uneven floor surfaces.  Choose a carpet design that does not hide the edge of steps of a stairway.  Be aware of any and all pets.  Review your medicines with your healthcare provider. Some medicines can cause dizziness or changes in blood pressure, which increase your risk of falling. Talk  with your health care provider about other ways that you can decrease your risk of falls. This may include working with a physical therapist or trainer to improve your strength, balance, and endurance.   This information is not intended to replace advice given to you by your health care provider. Make sure you discuss any questions you have with your health care provider.   Document Released: 07/23/2002 Document Revised: 12/17/2014 Document Reviewed: 09/06/2014 Elsevier Interactive Patient Education 2016 [employer]  

## 2015-07-28 ENCOUNTER — Ambulatory Visit: Payer: Medicare Other | Admitting: Physical Therapy

## 2015-07-28 ENCOUNTER — Ambulatory Visit: Payer: Medicare Other | Admitting: Occupational Therapy

## 2015-07-28 ENCOUNTER — Encounter: Payer: Self-pay | Admitting: Occupational Therapy

## 2015-07-28 DIAGNOSIS — IMO0002 Reserved for concepts with insufficient information to code with codable children: Secondary | ICD-10-CM

## 2015-07-28 DIAGNOSIS — R201 Hypoesthesia of skin: Secondary | ICD-10-CM | POA: Diagnosis not present

## 2015-07-28 DIAGNOSIS — I69998 Other sequelae following unspecified cerebrovascular disease: Secondary | ICD-10-CM | POA: Diagnosis not present

## 2015-07-28 DIAGNOSIS — R29898 Other symptoms and signs involving the musculoskeletal system: Secondary | ICD-10-CM

## 2015-07-28 DIAGNOSIS — R279 Unspecified lack of coordination: Secondary | ICD-10-CM | POA: Diagnosis not present

## 2015-07-28 DIAGNOSIS — H539 Unspecified visual disturbance: Secondary | ICD-10-CM | POA: Diagnosis not present

## 2015-07-28 DIAGNOSIS — I6980 Unspecified sequelae of other cerebrovascular disease: Secondary | ICD-10-CM | POA: Diagnosis not present

## 2015-07-28 DIAGNOSIS — M6289 Other specified disorders of muscle: Secondary | ICD-10-CM | POA: Diagnosis not present

## 2015-07-28 NOTE — Therapy (Signed)
[facility] [address] [place], [place], [address] Phone: [phone]   Fax:  [phone]  Occupational Therapy Treatment  Patient Details  Name: Leah Figueroa MRN: [id number] Date of Birth: 26-Nov-1922 Referring Provider: Dr. Claudette LawsAndrew Kirsteins  Encounter Date: 07/28/2015      OT End of Session - 07/28/15 1312    Visit Number 2   Number of Visits 17   Date for OT Re-Evaluation 09/20/15   Authorization Type MCR - G code needed   Authorization - Visit Number 2   Authorization - Number of Visits 10   OT Start Time 1225   OT Stop Time 1310   OT Time Calculation (min) 45 min   Activity Tolerance Patient tolerated treatment well      Past Medical History  Diagnosis Date  . Other specified personal history presenting hazards to health(V15.89)   . Personal history of arthritis   . Unspecified personal history presenting hazards to health   . Cramp of limb   . Acute myocardial infarction of other anterior wall, episode of care unspecified   . Personal history of malignant neoplasm of breast   . Unspecified essential hypertension   . Personal history of other diseases of digestive system   . Unspecified hypothyroidism   . Other and unspecified hyperlipidemia   . Stroke [facility](HCC) 03/2015    "left sided issues"    Past Surgical History  Procedure Laterality Date  . Right thyroid lobectomy    . Cataract extraction      left-2006, right 2011  . Intrathoracic goiter resection    . Cholecystectomy    . Lumpectomy for breast cancer Left 80's    radiation therapy  . Back surgery      There were no vitals filed for this visit.  Visit Diagnosis:  Weakness of left hand  Lack of coordination due to stroke      Subjective Assessment - 07/28/15 1230    Subjective  I've been walking a lot today and tired   Pertinent History back surgery, breast CA   Limitations HOH   Patient Stated Goals to drop less from Lt hand    Currently in Pain? No/denies                      OT Treatments/Exercises (OP) - 07/28/15 0001    ADLs   ADL Comments Discussed with patient's daughter POC for O.T. and reviewed goals. Also discussed memory deficits and MRI results as well as safety considerations re: LUE d/t lack of sensation and Lt inattention   Exercises   Exercises Hand   Hand Exercises   Theraputty - Grip x 10 reps with yellow putty - see pt instructions   Theraputty - Pinch x 10 reps with yellow putty (index and long finger with thumb) - see pt instructions   Fine Motor Coordination   Other Fine Motor Exercises see pt instructions (Pt required mod to max cueing, however daughter present for education                OT Education - 07/28/15 1308    Education provided Yes   Education Details coordination and putty HEP   Person(s) Educated Patient;Child(ren)   Methods Explanation;Demonstration;Handout   Comprehension Verbalized understanding;Returned demonstration;Verbal cues required          OT Short Term Goals - 07/22/15 1350    OT SHORT TERM GOAL #1   Title Indepndent with HEP for Lt  hand coordination and strength (all STG's due 08/21/15)    Time 4   Period Weeks   Status New   OT SHORT TERM GOAL #2   Title Pt to verbalize understanding with visual strategies for Lt inattention and safety considerations with impaired sensation LUE   Time 4   Period Weeks   Status New   OT SHORT TERM GOAL #3   Title Indpependent with all BADLS   Time 4   Period Weeks   Status New   OT SHORT TERM GOAL #4   Title Improve grip strength Lt hand to 25 lbs or greater to assist with opening containers   Baseline Lt = 19 (Rt = 34 lbs)    Time 4   Period Weeks   Status New   OT SHORT TERM GOAL #5   Title Improve coordination as evidenced by performing 9 hole peg test in 75 sec. or less Lt hand   Baseline 93.37 sec.    Time 4   Period Weeks   Status New           OT Long Term Goals -  07/22/15 1353    OT LONG TERM GOAL #1   Title Independent with updated HEP (ALL LTG's due 09/20/15)   Time 8   Period Weeks   Status New   OT LONG TERM GOAL #2   Title Independent with simple cold meal prep and microwaveable items   Baseline dependent   Time 8   Period Weeks   Status New   OT LONG TERM GOAL #3   Title Improve coordination as evidenced by performing 9 hole peg test in 60 sec. or under Lt hand   Baseline 93.37 sec   Time 8   Period Weeks   Status New   OT LONG TERM GOAL #4   Title Pt to perform enviornmental scanning with 90% accuracy or greater   Time 8   Period Weeks   Status New   OT LONG TERM GOAL #5   Title Pt to return to simple financial management and medication management with supervision/cueing prn for accuracy/safety   Time 8   Period Weeks   Status New               Plan - 07/28/15 1313    Clinical Impression Statement Pt with chronic small vessel dz and white matter changes on MRI. Pt with decreased ability to follow instructions fully   Plan review coordination/putty HEP prn, issue visual strategies and home activities for Lt inattention as well as safety considerations for LUE d/t lack of sensation   OT Home Exercise Plan coordination and putty HEP 07/28/15.    Consulted and Agree with Plan of Care Patient;Family member/caregiver   Family Member Consulted daughter        Problem List Patient Active Problem List   Diagnosis Date Noted  . Left-sided neglect 07/01/2015  . History of right MCA stroke 07/01/2015  . Altered sensation due to stroke 04/29/2015  . Cognitive deficit, post-stroke 04/29/2015  . Pseudogout of left wrist 03/28/2015  . Hyperlipidemia LDL goal <70 03/26/2015  . Hyperglycemia 03/26/2015  . 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 Embolic stroke (HCC)   . Left hemiplegia (HCC)   . Homonymous hemianopia   . Chest wall trauma 01/11/2014  . Injury of left lower arm 01/11/2014   . Headache(784.0) 01/04/2013  . Allergic rhinitis, cause unspecified 01/04/2013  . Anemia 04/19/2012  . Epistaxis,  recurrent 04/19/2012  . Hypothyroidism 01/01/2008  . Essential hypertension 01/01/2008  . HYPERTENSION 01/01/2008  . MYOCARDIAL INFARCTION, ACUTE, ANTERIOR WALL 01/01/2008  . LEG CRAMPS 01/01/2008  . ADENOCARCINOMA, BREAST, HX OF 01/01/2008  . DIVERTICULOSIS, COLON, HX OF 01/01/2008  . ARTHRITIS, HX OF 01/01/2008  . RADIATION THERAPY, HX OF 01/01/2008  . CATARACT EXTRACTION, HX OF 01/01/2008  . CHOLECYSTECTOMY, LAPAROSCOPIC, HX OF 01/01/2008    Kelli Churn, OTR/L 07/28/2015, 1:15 PM  [facility] [address] [place], [place], [address] Phone: [phone]   Fax:  [phone]  Name: Leah Figueroa MRN: [id number] Date of Birth: 11-Aug-1923

## 2015-07-28 NOTE — Patient Instructions (Signed)
  Coordination Activities  Perform the following activities for 15-20 minutes 1 times per day with left hand(s).   Rotate ball in fingertips (clockwise and counter-clockwise).  Flip cards 1 at a time as fast as you can.  Rotate card in hand (clockwise and counter-clockwise).  Pick up coins and stack.    1. Grip Strengthening (Resistive Putty)   Squeeze putty using thumb and all fingers. Repeat _10-15___ times. Do __2__ sessions per day.   2. Roll putty into tube on table and pinch between index finger and thumb x 10 reps each, then middle finger and thumb x 10 reps each; 2x/day    **Do NOT use Lt hand for anything heavy, sharp, breakable, or hot**

## 2015-07-29 ENCOUNTER — Ambulatory Visit (HOSPITAL_BASED_OUTPATIENT_CLINIC_OR_DEPARTMENT_OTHER): Payer: Medicare Other | Admitting: Physical Medicine & Rehabilitation

## 2015-07-29 ENCOUNTER — Ambulatory Visit: Payer: Medicare Other | Admitting: Physical Therapy

## 2015-07-29 ENCOUNTER — Encounter: Payer: Self-pay | Admitting: Physical Medicine & Rehabilitation

## 2015-07-29 ENCOUNTER — Encounter: Payer: Medicare Other | Attending: Physical Medicine & Rehabilitation

## 2015-07-29 VITALS — BP 112/63 | HR 62 | Resp 14

## 2015-07-29 DIAGNOSIS — I10 Essential (primary) hypertension: Secondary | ICD-10-CM | POA: Diagnosis not present

## 2015-07-29 DIAGNOSIS — I69398 Other sequelae of cerebral infarction: Secondary | ICD-10-CM | POA: Diagnosis not present

## 2015-07-29 DIAGNOSIS — I6931 Attention and concentration deficit following cerebral infarction: Secondary | ICD-10-CM | POA: Insufficient documentation

## 2015-07-29 DIAGNOSIS — G8194 Hemiplegia, unspecified affecting left nondominant side: Secondary | ICD-10-CM

## 2015-07-29 DIAGNOSIS — R414 Neurologic neglect syndrome: Secondary | ICD-10-CM | POA: Diagnosis not present

## 2015-07-29 DIAGNOSIS — I69319 Unspecified symptoms and signs involving cognitive functions following cerebral infarction: Secondary | ICD-10-CM | POA: Diagnosis not present

## 2015-07-29 DIAGNOSIS — I639 Cerebral infarction, unspecified: Secondary | ICD-10-CM | POA: Diagnosis not present

## 2015-07-29 DIAGNOSIS — R208 Other disturbances of skin sensation: Secondary | ICD-10-CM | POA: Insufficient documentation

## 2015-07-29 DIAGNOSIS — E785 Hyperlipidemia, unspecified: Secondary | ICD-10-CM | POA: Insufficient documentation

## 2015-07-29 DIAGNOSIS — G8190 Hemiplegia, unspecified affecting unspecified side: Secondary | ICD-10-CM | POA: Insufficient documentation

## 2015-07-29 NOTE — Progress Notes (Signed)
Subjective:    Patient ID: Leah FillerMabel E Metoyer, female    DOB: 1923-07-04, 81 y.o.   MRN: [id number] 81 year old female with right MCA infarct in August 2016. Underwent inpatient rehabilitation and was discharged on August 23 to [facility]. She remained [facility] where she received PT OT and speech therapy until 05/08/2015. She was discharged to her home where her daughter is staying with her [id number] to provide supervision. Patient has been getting home health therapy PTOT speech and they are now finishing. Patient was able to bathe herself today. Still requires a little bit of help with bathing and dressing from her daughter. Ambulating with a Rollator walker HPI Started OP PT and OT therapy last week, needs help with UE dressing, needs some physical assist for bathing No shoulder pain  Larey SeatFell last Friday went to ED had stiches in Left ear Pain Inventory Average Pain 0 Pain Right Now 0 My pain is no pain  In the last 24 hours, has pain interfered with the following? General activity 0 Relation with others 0 Enjoyment of life 0 What TIME of day is your pain at its worst? no pain Sleep (in general) Good  Pain is worse with: no pain Pain improves with: no pain Relief from Meds: no pain  Mobility walk with assistance use a walker ability to climb steps?  yes do you drive?  no  Function retired I need assistance with the following:  dressing, bathing, meal prep, household duties and shopping Do you have any goals in this area?  no  Neuro/Psych bowel control problems  Prior Studies Any changes since last visit?  no CT/MRI  Physicians involved in your care Any changes since last visit?  no   Family History  Problem Relation Age of Onset  . Colon cancer Mother   . Coronary artery disease Brother   . Breast cancer Mother    Social History   Social History  . Marital Status: Widowed    Spouse Name: N/A  . Number of Children: 1  . Years of  Education: 11   Occupational History  . retired    Social History Main Topics  . Smoking status: Never Smoker   . Smokeless tobacco: Never Used  . Alcohol Use: No  . Drug Use: No  . Sexual Activity: No   Other Topics Concern  . None   Social History Narrative   HSG. Married '48- 57 yrs/widowed. 1 daughter - '59; 1 grandchild. 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  Lives alone. I-ADLs, manages books, pays bills.. End-of-Life Care: no heroic measures; No CPR, no mechanical ventilation.  Caffeine use/day:  None. Does Patient Exercise:  No       04/29/15 currently resident at [facility]      Past Surgical History  Procedure Laterality Date  . Right thyroid lobectomy    . Cataract extraction      left-2006, right 2011  . Intrathoracic goiter resection    . Cholecystectomy    . Lumpectomy for breast cancer Left 80's    radiation therapy  . Back surgery     Past Medical History  Diagnosis Date  . Other specified personal history presenting hazards to health(V15.89)   . Personal history of arthritis   . Unspecified personal history presenting hazards to health   . Cramp of limb   . Acute myocardial infarction of other anterior wall, episode of care unspecified   . Personal history of malignant neoplasm  of breast   . Unspecified essential hypertension   . Personal history of other diseases of digestive system   . Unspecified hypothyroidism   . Other and unspecified hyperlipidemia   . Stroke [facility]) 03/2015    "left sided issues"   BP 112/63 mmHg  Pulse 62  Resp 14  SpO2 98%  Opioid Risk Score:   Fall Risk Score:  `1  Depression screen PHQ 2/9  Depression screen PHQ 2/9 04/29/2015  Decreased Interest 2  Down, Depressed, Hopeless 3  PHQ - 2 Score 5  Altered sleeping 2  Tired, decreased energy 0  Change in appetite 3  Feeling bad or failure about yourself  2  Trouble concentrating 0  Moving slowly or fidgety/restless 0  Suicidal thoughts 0  PHQ-9 Score 12    Difficult doing work/chores Not difficult at all     Review of Systems  Gastrointestinal: Positive for diarrhea.  All other systems reviewed and are negative.      Objective:   Physical Exam  Constitutional: She is oriented to person, place, and time. She appears well-developed and well-nourished.  HENT:  Head: Normocephalic and atraumatic.  Eyes: EOM are normal. Pupils are equal, round, and reactive to light.  Neck: Normal range of motion.  Neurological: She is alert and oriented to person, place, and time.  Psychiatric: She has a normal mood and affect.  Nursing note and vitals reviewed.  Motor strength is 4/5 in the left deltoid, biceps, triceps, grip, hip flexor, knee extensor, ankle dorsi flexor 5/5 in the right deltoid, biceps, triceps, grip, hip flexor, knee extensor, dorsi flexor Sensation is absent to light touch in the left upper and left lower limb. Ambulates with a rolling walker no evidence of total drag or knee instability  Bruising along the left lateral ribs tenderness to palpation  No pain with left upper extremity or left lower extremity range of motion no pain with right upper extremity or light lower extremity range of motion     Assessment & Plan:  1. Right MCA distribution infarct with chronic left hemiparesis and left hemisensory deficits. She still at a fall risk. Fortunately she had no significant musculoskeletal injuries but did have a laceration on her left ear. Obviously still at a fall risk. She will be getting supervision from her daughter Rehab f/u 1 mo

## 2015-07-29 NOTE — Patient Instructions (Signed)
-   Continue outpatient therapy

## 2015-08-01 ENCOUNTER — Ambulatory Visit: Payer: Medicare Other | Admitting: Occupational Therapy

## 2015-08-01 ENCOUNTER — Ambulatory Visit
Admission: RE | Admit: 2015-08-01 | Discharge: 2015-08-01 | Disposition: A | Discharge status: Home / Self Care | Payer: Medicare Other | Source: Ambulatory Visit | Attending: Internal Medicine | Admitting: Internal Medicine

## 2015-08-01 ENCOUNTER — Ambulatory Visit: Payer: Medicare Other

## 2015-08-01 ENCOUNTER — Other Ambulatory Visit: Payer: Self-pay | Admitting: Internal Medicine

## 2015-08-01 DIAGNOSIS — M25572 Pain in left ankle and joints of left foot: Secondary | ICD-10-CM | POA: Diagnosis not present

## 2015-08-01 NOTE — Therapy (Signed)
[facility] [address] [place], [place], [address] Phone: [phone]   Fax:  [phone]  Physical Therapy Treatment  Patient Details  Name: Leah Figueroa MRN: [id number] Date of Birth: 17-Oct-1922 Referring Provider: Claudette Laws, MD  Encounter Date: 08/01/2015   NO CHARGE      PT End of Session - 08/01/15 1001    Visit Number 2  no charge today 08/01/15   Number of Visits 16   Date for PT Re-Evaluation 08/20/14   Authorization Type Medicare G-Code & progress note every 10 visits   PT Start Time 0938   PT Stop Time 0944   PT Time Calculation (min) 6 min      Past Medical History  Diagnosis Date  . Other specified personal history presenting hazards to health(V15.89)   . Personal history of arthritis   . Unspecified personal history presenting hazards to health   . Cramp of limb   . Acute myocardial infarction of other anterior wall, episode of care unspecified   . Personal history of malignant neoplasm of breast   . Unspecified essential hypertension   . Personal history of other diseases of digestive system   . Unspecified hypothyroidism   . Other and unspecified hyperlipidemia   . Stroke [facility]) 03/2015    "left sided issues"    Past Surgical History  Procedure Laterality Date  . Right thyroid lobectomy    . Cataract extraction      left-2006, right 2011  . Intrathoracic goiter resection    . Cholecystectomy    . Lumpectomy for breast cancer Left 80's    radiation therapy  . Back surgery      There were no vitals filed for this visit.  Visit Diagnosis:  Abnormality of gait      Subjective Assessment - 08/01/15 0959    Subjective Pt arrived late and upon arrival, pt's dtr reported pt's Figueroa ankle has been bothering her and asked for PT to assess prior to treatment. Pt's dtr reported pt twisted ankle on Wednesday night while walking at church without rollator and reported pain and swelling  since that time. Pt's dtr concerned that pt's cognition is not reliable enough to indicate increased ankle pain.    Patient is accompained by: Family member   Diagnostic tests She wants to be able to be left alone.    Currently in Pain? Yes  unable to rate                                    PT Short Term Goals - 07/22/15 1145    PT SHORT TERM GOAL #1   Title Patient demonstrates understanding of inital HEP. (Target Date: 08/20/2014)   Time 1   Period Months   Status New   PT SHORT TERM GOAL #2   Title Timed Up & Go without device with supervision <18sec. (Target Date: 08/20/2014)   Time 1   Period Months   Status New   PT SHORT TERM GOAL #3   Title Paitent ambulates 300' with rollator walker with supervision. (Target Date: 08/20/2014)   Time 1   Period Months   Status New           PT Long Term Goals - 07/22/15 1145    PT LONG TERM GOAL #1   Title Berg Balance >45/56 to indicate lower fall risk (Target Date: 09/18/2014)  Time 2   Period Months   Status New   PT LONG TERM GOAL #2   Title Timed Up & Go without device safely <15 sec. (Target Date: 09/18/2014)   Time 2   Period Months   Status New   PT LONG TERM GOAL #3   Title Patient ambulates 400' with rollator walker with supervision for community mobility. (Target Date: 09/18/2014)   Time 2   Period Months   Status New   PT LONG TERM GOAL #4   Title Patient ambulates 100' around furniture with LRAD modified independent. (Target Date: 09/18/2014)   Time 2   Status New   PT LONG TERM GOAL #5   Title Patient negotiates ramps, curbs with rollator walker and stairs with 1 rail with supervision. (Target Date: 09/18/2014)   Time 2   Period Months   Status New               Plan - 08/01/15 1001    Clinical Impression Statement No charge for visit. PT assessed pt's Figueroa ankle/foot and noted mild edema around medial malleolus and pain upon standing and bearing weight through LLE and during palpation  of medial and lateral malleolus. PT encouraged pt's dtr to take pt to her PCP MD or urgent care to assess for sprain/strains or Figueroa foot/ankle fractures and no charge for visit today.  PT educated pt to rest, ice and elevate ankle and to avoid wt. Bearing until assessed by MD.    Pt will benefit from skilled therapeutic intervention in order to improve on the following deficits Abnormal gait;Decreased activity tolerance;Decreased balance;Decreased endurance;Decreased mobility;Decreased strength;Postural dysfunction;Decreased safety awareness   Rehab Potential Good   PT Frequency 2x / week   PT Duration 8 weeks   PT Treatment/Interventions ADLs/[facility] Management;DME Instruction;Gait training;Stair training;Functional mobility training;Therapeutic activities;Therapeutic exercise;Balance training;Neuromuscular re-education;Patient/family education   PT Next Visit Plan review OTAGO exercises already instructed and remainder of program.    Consulted and Agree with Plan of Care Patient;Family member/caregiver   Family Member Consulted pt's dtr        Problem List Patient Active Problem List   Diagnosis Date Noted  . Left-sided neglect 07/01/2015  . History of right MCA stroke 07/01/2015  . Altered sensation due to stroke 04/29/2015  . Cognitive deficit, post-stroke 04/29/2015  . Pseudogout of left wrist 03/28/2015  . Hyperlipidemia LDL goal <70 03/26/2015  . Hyperglycemia 03/26/2015  . 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 Embolic stroke (HCC)   . Left hemiplegia (HCC)   . Homonymous hemianopia   . Chest wall trauma 01/11/2014  . Injury of left lower arm 01/11/2014  . Headache(784.0) 01/04/2013  . Allergic rhinitis, cause unspecified 01/04/2013  . Anemia 04/19/2012  . Epistaxis, recurrent 04/19/2012  . Hypothyroidism 01/01/2008  . Essential hypertension 01/01/2008  . HYPERTENSION 01/01/2008  . MYOCARDIAL INFARCTION, ACUTE, ANTERIOR WALL  01/01/2008  . LEG CRAMPS 01/01/2008  . ADENOCARCINOMA, BREAST, HX OF 01/01/2008  . DIVERTICULOSIS, COLON, HX OF 01/01/2008  . ARTHRITIS, HX OF 01/01/2008  . RADIATION THERAPY, HX OF 01/01/2008  . CATARACT EXTRACTION, HX OF 01/01/2008  . CHOLECYSTECTOMY, LAPAROSCOPIC, HX OF 01/01/2008    Leah Figueroa 08/01/2015, 10:04 AM  [facility] [address] [place], [place], [address] Phone: [phone]   Fax:  [phone]  Name: Estrella MyrtleMabel E Figueroa MRN: [id number] Date of Birth: [birth year]    Zerita BoersJennifer Vester [place]worth, PT,DPT 08/01/2015 10:04 AM Phone: [phone] Fax: [phone]

## 2015-08-04 ENCOUNTER — Encounter: Payer: Self-pay | Admitting: Physical Therapy

## 2015-08-04 ENCOUNTER — Ambulatory Visit: Payer: Medicare Other | Admitting: Occupational Therapy

## 2015-08-04 ENCOUNTER — Ambulatory Visit: Payer: Medicare Other | Admitting: Physical Therapy

## 2015-08-04 DIAGNOSIS — IMO0002 Reserved for concepts with insufficient information to code with codable children: Secondary | ICD-10-CM

## 2015-08-04 DIAGNOSIS — R2689 Other abnormalities of gait and mobility: Secondary | ICD-10-CM

## 2015-08-04 DIAGNOSIS — H539 Unspecified visual disturbance: Secondary | ICD-10-CM

## 2015-08-04 DIAGNOSIS — R269 Unspecified abnormalities of gait and mobility: Secondary | ICD-10-CM

## 2015-08-04 DIAGNOSIS — R4189 Other symptoms and signs involving cognitive functions and awareness: Secondary | ICD-10-CM

## 2015-08-04 DIAGNOSIS — R2681 Unsteadiness on feet: Secondary | ICD-10-CM

## 2015-08-04 DIAGNOSIS — I69398 Other sequelae of cerebral infarction: Principal | ICD-10-CM

## 2015-08-04 DIAGNOSIS — R6889 Other general symptoms and signs: Secondary | ICD-10-CM

## 2015-08-04 DIAGNOSIS — I69998 Other sequelae following unspecified cerebrovascular disease: Secondary | ICD-10-CM | POA: Diagnosis not present

## 2015-08-04 DIAGNOSIS — R201 Hypoesthesia of skin: Secondary | ICD-10-CM | POA: Diagnosis not present

## 2015-08-04 DIAGNOSIS — I6980 Unspecified sequelae of other cerebrovascular disease: Secondary | ICD-10-CM | POA: Diagnosis not present

## 2015-08-04 DIAGNOSIS — M6289 Other specified disorders of muscle: Secondary | ICD-10-CM | POA: Diagnosis not present

## 2015-08-04 DIAGNOSIS — R279 Unspecified lack of coordination: Secondary | ICD-10-CM | POA: Diagnosis not present

## 2015-08-04 NOTE — Patient Instructions (Addendum)
Visual strategies:    1. Look for the edge of objects (to the left and/or right) so that you make sure you are seeing all of an object 2. Turn your head when walking, scan from side to side, particularly in busy environments 3. Use an organized scanning pattern. It's usually easier to scan from top to bottom, and left to right (like you are reading) 4. Double check yourself 5. Use a line guide (like a blank piece of paper) or your finger when reading 6. If necessary, place brightly colored tape at end of table or work area as a reminder to always look until you see the tape.    Activities to try at home to encourage visual scanning:   1. Word searches 2. Mazes 3. Puzzles 4. Card games/board games 5. Computer games and/or searches 6. Connect-the-dots  Activities for environmental (larger) scanning:  1. With supervision, scan for items in grocery store or drugstore.  Begin with a familiar store, then progress to a new store you've never been in before. Make sure you have supervision with this.  2. With supervision, tell a family member or caregiver when it is safe to cross a street after looking all directions and any side streets. However, do NOT cross street unless family member or caregiver is with you and says it is OK    SAFETY TIPS FOR DECREASED SENSATION LEFT HAND:   Always check temperature of water with Rt hand Do NOT use Lt hand to lift or hold anything sharp, heavy, breakable, or hot Lift coffee/tea with Rt hand

## 2015-08-04 NOTE — Therapy (Signed)
[facility] [address] [place], [place], [address] Phone: [phone]   Fax:  [phone]  Occupational Therapy Treatment  Patient Details  Name: Leah Figueroa MRN: [id number] Date of Birth: 10/08/1922 Referring Provider: Dr. Claudette Laws  Encounter Date: 08/04/2015      OT End of Session - 08/04/15 1348    Visit Number 3   Number of Visits 17   Date for OT Re-Evaluation 09/20/15   Authorization Type MCR - G code needed   Authorization - Visit Number 3   Authorization - Number of Visits 10   OT Start Time 1235   OT Stop Time 1335   OT Time Calculation (min) 60 min   Activity Tolerance Patient tolerated treatment well      Past Medical History  Diagnosis Date  . Other specified personal history presenting hazards to health(V15.89)   . Personal history of arthritis   . Unspecified personal history presenting hazards to health   . Cramp of limb   . Acute myocardial infarction of other anterior wall, episode of care unspecified   . Personal history of malignant neoplasm of breast   . Unspecified essential hypertension   . Personal history of other diseases of digestive system   . Unspecified hypothyroidism   . Other and unspecified hyperlipidemia   . Stroke [facility]) 03/2015    "left sided issues"    Past Surgical History  Procedure Laterality Date  . Right thyroid lobectomy    . Cataract extraction      left-2006, right 2011  . Intrathoracic goiter resection    . Cholecystectomy    . Lumpectomy for breast cancer Left 80's    radiation therapy  . Back surgery      There were no vitals filed for this visit.  Visit Diagnosis:  Vision disturbance following cerebrovascular accident  Impaired sensation  Impaired cognition      Subjective Assessment - 08/04/15 1245    Pertinent History back surgery, breast CA   Limitations HOH   Patient Stated Goals to drop less from Lt hand   Currently in Pain?  No/denies                      OT Treatments/Exercises (OP) - 08/04/15 0001    ADLs   ADL Comments reviewed safety tips/considerations re: lack of sensation Lt hand with pt/daughter - see pt instrucitons   Visual/Perceptual Exercises   Other Exercises see pt instructions for education re: visual scanning/strategies. Pt completing simple 24 pc. puzzle with max cueing and assist, and extra time to complete. Pt with noted inattention to Rt side and Lt side. Pt did NOT demo any strategy, organization, or problem solving skills                OT Education - 08/04/15 1303    Education provided Yes   Education Details visual compensatory strategies, safety tips for decreased sensation Lt hand   Person(s) Educated Patient;Child(ren)  Pt's daughter   Methods Explanation;Handout   Comprehension Verbalized understanding          OT Short Term Goals - 07/22/15 1350    OT SHORT TERM GOAL #1   Title Indepndent with HEP for Lt hand coordination and strength (all STG's due 08/21/15)    Time 4   Period Weeks   Status New   OT SHORT TERM GOAL #2   Title Pt to verbalize understanding with visual strategies for Lt inattention  and safety considerations with impaired sensation LUE   Time 4   Period Weeks   Status New   OT SHORT TERM GOAL #3   Title Indpependent with all BADLS   Time 4   Period Weeks   Status New   OT SHORT TERM GOAL #4   Title Improve grip strength Lt hand to 25 lbs or greater to assist with opening containers   Baseline Lt = 19 (Rt = 34 lbs)    Time 4   Period Weeks   Status New   OT SHORT TERM GOAL #5   Title Improve coordination as evidenced by performing 9 hole peg test in 75 sec. or less Lt hand   Baseline 93.37 sec.    Time 4   Period Weeks   Status New           OT Long Term Goals - 07/22/15 1353    OT LONG TERM GOAL #1   Title Independent with updated HEP (ALL LTG's due 09/20/15)   Time 8   Period Weeks   Status New   OT LONG TERM  GOAL #2   Title Independent with simple cold meal prep and microwaveable items   Baseline dependent   Time 8   Period Weeks   Status New   OT LONG TERM GOAL #3   Title Improve coordination as evidenced by performing 9 hole peg test in 60 sec. or under Lt hand   Baseline 93.37 sec   Time 8   Period Weeks   Status New   OT LONG TERM GOAL #4   Title Pt to perform enviornmental scanning with 90% accuracy or greater   Time 8   Period Weeks   Status New   OT LONG TERM GOAL #5   Title Pt to return to simple financial management and medication management with supervision/cueing prn for accuracy/safety   Time 8   Period Weeks   Status New               Problem List Patient Active Problem List   Diagnosis Date Noted  . Left-sided neglect 07/01/2015  . History of right MCA stroke 07/01/2015  . Altered sensation due to stroke 04/29/2015  . Cognitive deficit, post-stroke 04/29/2015  . Pseudogout of left wrist 03/28/2015  . Hyperlipidemia LDL goal <70 03/26/2015  . Hyperglycemia 03/26/2015  . 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 Embolic stroke (HCC)   . Left hemiplegia (HCC)   . Homonymous hemianopia   . Chest wall trauma 01/11/2014  . Injury of left lower arm 01/11/2014  . Headache(784.0) 01/04/2013  . Allergic rhinitis, cause unspecified 01/04/2013  . Anemia 04/19/2012  . Epistaxis, recurrent 04/19/2012  . Hypothyroidism 01/01/2008  . Essential hypertension 01/01/2008  . HYPERTENSION 01/01/2008  . MYOCARDIAL INFARCTION, ACUTE, ANTERIOR WALL 01/01/2008  . LEG CRAMPS 01/01/2008  . ADENOCARCINOMA, BREAST, HX OF 01/01/2008  . DIVERTICULOSIS, COLON, HX OF 01/01/2008  . ARTHRITIS, HX OF 01/01/2008  . RADIATION THERAPY, HX OF 01/01/2008  . CATARACT EXTRACTION, HX OF 01/01/2008  . CHOLECYSTECTOMY, LAPAROSCOPIC, HX OF 01/01/2008    Kelli ChurnBallie, Lareen Mullings Johnson, OTR/L 08/04/2015, 1:49 PM  [facility] [address] [place], [place], [address] Phone: [phone]   Fax:  [phone]  Name: Estrella MyrtleMabel E Giles MRN: [id number] Date of Birth: 1922/11/22

## 2015-08-04 NOTE — Therapy (Signed)
[facility] [address] [place], [place], [address] Phone: [phone]   Fax:  [phone]  Physical Therapy Treatment  Patient Details  Name: Leah Figueroa MRN: [id number] Date of Birth: Apr 20, 1923 Referring Provider: Claudette LawsAndrew Kirsteins, MD  Encounter Date: 08/04/2015      PT End of Session - 08/04/15 1230    Visit Number 3   Number of Visits 16   Date for PT Re-Evaluation 08/20/14   Authorization Type Medicare G-Code & progress note every 10 visits   PT Start Time 1150   PT Stop Time 1230   PT Time Calculation (min) 40 min   Activity Tolerance Patient tolerated treatment well   Behavior [facility] tasks assessed/performed      Past Medical History  Diagnosis Date  . Other specified personal history presenting hazards to health(V15.89)   . Personal history of arthritis   . Unspecified personal history presenting hazards to health   . Cramp of limb   . Acute myocardial infarction of other anterior wall, episode of care unspecified   . Personal history of malignant neoplasm of breast   . Unspecified essential hypertension   . Personal history of other diseases of digestive system   . Unspecified hypothyroidism   . Other and unspecified hyperlipidemia   . Stroke [facility](HCC) 03/2015    "left sided issues"    Past Surgical History  Procedure Laterality Date  . Right thyroid lobectomy    . Cataract extraction      left-2006, right 2011  . Intrathoracic goiter resection    . Cholecystectomy    . Lumpectomy for breast cancer Left 80's    radiation therapy  . Back surgery      There were no vitals filed for this visit.  Visit Diagnosis:  Abnormality of gait  Weakness due to cerebrovascular accident  Unsteadiness  Balance problems  Decreased functional activity tolerance      Subjective Assessment - 08/04/15 1145    Subjective Patient's daughter reports they had X-ray of ankle with no  fracture but sprain. Patient reports ankle is okay now. The sprain happened when exiting church when dark and not using walker with daughter's assist.    Patient is accompained by: Family member   Currently in Pain? No/denies                         Gait Training: PT demonstrated, instructed in difference between standard rolling walker & rollator. Patient ambulated 100' around furniture with SBA / cues with standard RW.  [facility]-Care: 30 minute discussion on fall prevention and analyzing recent near fall /ankle injury. See pt education.      PT Education - 08/04/15 1230    Education provided Yes   Education Details fall prevention and recommendation for use of walker for all gait; standard RW vs rollator walker; incontinence issues with increased fall risk.    Person(s) Educated Patient;Child(ren)   Methods Explanation;Verbal cues   Comprehension Verbalized understanding;Verbal cues required;Need further instruction          PT Short Term Goals - 07/22/15 1145    PT SHORT TERM GOAL #1   Title Patient demonstrates understanding of inital HEP. (Target Date: 08/20/2014)   Time 1   Period Months   Status New   PT SHORT TERM GOAL #2   Title Timed Up & Go without device with supervision <18sec. (Target Date: 08/20/2014)   Time 1  Period Months   Status New   PT SHORT TERM GOAL #3   Title Paitent ambulates 300' with rollator walker with supervision. (Target Date: 08/20/2014)   Time 1   Period Months   Status New           PT Long Term Goals - 07/22/15 1145    PT LONG TERM GOAL #1   Title Berg Balance >45/56 to indicate lower fall risk (Target Date: 09/18/2014)   Time 2   Period Months   Status New   PT LONG TERM GOAL #2   Title Timed Up & Go without device safely <15 sec. (Target Date: 09/18/2014)   Time 2   Period Months   Status New   PT LONG TERM GOAL #3   Title Patient ambulates 400' with rollator walker with supervision for community mobility. (Target  Date: 09/18/2014)   Time 2   Period Months   Status New   PT LONG TERM GOAL #4   Title Patient ambulates 100' around furniture with LRAD modified independent. (Target Date: 09/18/2014)   Time 2   Status New   PT LONG TERM GOAL #5   Title Patient negotiates ramps, curbs with rollator walker and stairs with 1 rail with supervision. (Target Date: 09/18/2014)   Time 2   Period Months   Status New               Plan - 08/04/15 1230    Clinical Impression Statement Patient and daughter appear to understand recommendation for use of assistive device at all times to decrease fall risk. Patient reports standard RW felt sturdier than rollator and may benefit from youth (she has short height) RW for home use.    Pt will benefit from skilled therapeutic intervention in order to improve on the following deficits Abnormal gait;Decreased activity tolerance;Decreased balance;Decreased endurance;Decreased mobility;Decreased strength;Postural dysfunction;Decreased safety awareness   Rehab Potential Good   PT Frequency 2x / week   PT Duration 8 weeks   PT Treatment/Interventions ADLs/[place] [facility] Home Management;DME Instruction;Gait training;Stair training;Functional mobility training;Therapeutic activities;Therapeutic exercise;Balance training;Neuromuscular re-education;Patient/family education   PT Next Visit Plan review OTAGO exercises already instructed and remainder of program. Gait with standard RW & rollator walker for comparison.    Consulted and Agree with Plan of Care Patient;Family member/caregiver   Family Member Consulted pt's dtr        Problem List Patient Active Problem List   Diagnosis Date Noted  . Left-sided neglect 07/01/2015  . History of right MCA stroke 07/01/2015  . Altered sensation due to stroke 04/29/2015  . Cognitive deficit, post-stroke 04/29/2015  . Pseudogout of left wrist 03/28/2015  . Hyperlipidemia LDL goal <70 03/26/2015  . Hyperglycemia 03/26/2015  . Thrombotic  stroke involving right middle cerebral artery (HCC) 03/26/2015  . Embolic stroke (HCC)   . Left hemiplegia (HCC)   . Homonymous hemianopia   . Chest wall trauma 01/11/2014  . Injury of left lower arm 01/11/2014  . Headache(784.0) 01/04/2013  . Allergic rhinitis, cause unspecified 01/04/2013  . Anemia 04/19/2012  . Epistaxis, recurrent 04/19/2012  . Hypothyroidism 01/01/2008  . Essential hypertension 01/01/2008  . HYPERTENSION 01/01/2008  . MYOCARDIAL INFARCTION, ACUTE, ANTERIOR WALL 01/01/2008  . LEG CRAMPS 01/01/2008  . ADENOCARCINOMA, BREAST, HX OF 01/01/2008  . DIVERTICULOSIS, COLON, HX OF 01/01/2008  . ARTHRITIS, HX OF 01/01/2008  . RADIATION THERAPY, HX OF 01/01/2008  . CATARACT EXTRACTION, HX OF 01/01/2008  . CHOLECYSTECTOMY, LAPAROSCOPIC, HX OF 01/01/2008    Abednego Yeates PT,  DPT 08/04/2015, 10:05 PM  [facility] [address] [place], [place], [address] Phone: [phone]   Fax:  [phone]  Name: Leah Figueroa MRN: [id number] Date of Birth: 28-Mar-1923

## 2015-08-07 ENCOUNTER — Encounter: Payer: Self-pay | Admitting: Physical Therapy

## 2015-08-07 ENCOUNTER — Ambulatory Visit: Payer: Medicare Other | Admitting: *Deleted

## 2015-08-07 ENCOUNTER — Ambulatory Visit: Payer: Medicare Other | Admitting: Physical Therapy

## 2015-08-07 DIAGNOSIS — M6289 Other specified disorders of muscle: Secondary | ICD-10-CM | POA: Diagnosis not present

## 2015-08-07 DIAGNOSIS — R4189 Other symptoms and signs involving cognitive functions and awareness: Secondary | ICD-10-CM

## 2015-08-07 DIAGNOSIS — R279 Unspecified lack of coordination: Secondary | ICD-10-CM | POA: Diagnosis not present

## 2015-08-07 DIAGNOSIS — R2689 Other abnormalities of gait and mobility: Secondary | ICD-10-CM

## 2015-08-07 DIAGNOSIS — H539 Unspecified visual disturbance: Secondary | ICD-10-CM | POA: Diagnosis not present

## 2015-08-07 DIAGNOSIS — R201 Hypoesthesia of skin: Secondary | ICD-10-CM | POA: Diagnosis not present

## 2015-08-07 DIAGNOSIS — IMO0002 Reserved for concepts with insufficient information to code with codable children: Secondary | ICD-10-CM

## 2015-08-07 DIAGNOSIS — I6980 Unspecified sequelae of other cerebrovascular disease: Secondary | ICD-10-CM | POA: Diagnosis not present

## 2015-08-07 DIAGNOSIS — I69398 Other sequelae of cerebral infarction: Principal | ICD-10-CM

## 2015-08-07 DIAGNOSIS — R2681 Unsteadiness on feet: Secondary | ICD-10-CM

## 2015-08-07 DIAGNOSIS — R6889 Other general symptoms and signs: Secondary | ICD-10-CM

## 2015-08-07 DIAGNOSIS — R29898 Other symptoms and signs involving the musculoskeletal system: Secondary | ICD-10-CM

## 2015-08-07 DIAGNOSIS — I69998 Other sequelae following unspecified cerebrovascular disease: Secondary | ICD-10-CM | POA: Diagnosis not present

## 2015-08-07 DIAGNOSIS — R269 Unspecified abnormalities of gait and mobility: Secondary | ICD-10-CM

## 2015-08-07 NOTE — Therapy (Signed)
[facility] [address] [place], [place], [address] Phone: [phone]   Fax:  [phone]  Occupational Therapy Treatment  Patient Details  Name: Leah Figueroa MRN: [id number] Date of Birth: [birth year] Referring Provider: Dr. Claudette Laws  Encounter Date: 08/07/2015      OT End of Session - 08/07/15 1507    Visit Number 4   Number of Visits 17   Date for OT Re-Evaluation 09/20/15   Authorization Type MCR - G code needed   Authorization - Visit Number 4   Authorization - Number of Visits 10   OT Start Time 1400   OT Stop Time 1445   OT Time Calculation (min) 45 min   Activity Tolerance Patient tolerated treatment well   Behavior During Therapy [facility] for tasks assessed/performed      Past Medical History  Diagnosis Date  . Other specified personal history presenting hazards to health(V15.89)   . Personal history of arthritis   . Unspecified personal history presenting hazards to health   . Cramp of limb   . Acute myocardial infarction of other anterior wall, episode of care unspecified   . Personal history of malignant neoplasm of breast   . Unspecified essential hypertension   . Personal history of other diseases of digestive system   . Unspecified hypothyroidism   . Other and unspecified hyperlipidemia   . Stroke [facility]) 03/2015    "left sided issues"    Past Surgical History  Procedure Laterality Date  . Right thyroid lobectomy    . Cataract extraction      left-2006, right 2011  . Intrathoracic goiter resection    . Cholecystectomy    . Lumpectomy for breast cancer Left 80's    radiation therapy  . Back surgery      There were no vitals filed for this visit.  Visit Diagnosis:  Vision disturbance following cerebrovascular accident  Impaired sensation  Impaired cognition  Weakness of left hand      Subjective Assessment - 08/07/15 1409    Subjective  I feel good today   Patient is  accompained by: Family member  daugther   Pertinent History back surgery, breast CA   Limitations HOH   Patient Stated Goals to drop less from Lt hand   Currently in Pain? No/denies   Pain Score 0-No pain          Therapeutic activity focusing on visual scanning. Patient engaged in visual scanning worksheets (crossing out designated letters every time they appear in the line, maze activity, word search) AND card activity. Pt with increased difficulty completing the word search and took increased time for this. As for the other activities, pt required min instructional cueing. Pt stayed on task once she understood what to do.   Pt took remainder of word search activity home to be completed later.   Re-printed out instructions for visual scanning activities, recommendations for visual scanning in environment, and recommendations due to decreased sensation in Lt hand. Re-administered this to patient. Patient's daughter stated she has not been completing her exercises. Encouraged daughter to help patient with exercises and encourage as much as possible.                     OT Education - 08/07/15 1500    Education provided Yes   Education Details Continue visual scanning strategies. Gave Word Search for patient to complete at home.    Person(s) Educated Patient;Child(ren)  patient's daughter  Methods Explanation;Handout;Demonstration   Comprehension Verbal cues required;Need further instruction  pt requires encouragement to complete          OT Short Term Goals - 08/07/15 1507    OT SHORT TERM GOAL #1   Title Indepndent with HEP for Lt hand coordination and strength (all STG's due 08/21/15)    Time 4   Period Weeks   Status On-going   OT SHORT TERM GOAL #2   Title Pt to verbalize understanding with visual strategies for Lt inattention and safety considerations with impaired sensation LUE   Time 4   Period Weeks   Status On-going   OT SHORT TERM GOAL #3   Title  Indpependent with all BADLS   Time 4   Period Weeks   Status On-going   OT SHORT TERM GOAL #4   Title Improve grip strength Lt hand to 25 lbs or greater to assist with opening containers   Baseline Lt = 19 (Rt = 34 lbs)    Time 4   Period Weeks   Status On-going   OT SHORT TERM GOAL #5   Title Improve coordination as evidenced by performing 9 hole peg test in 75 sec. or less Lt hand   Baseline 93.37 sec.    Time 4   Period Weeks   Status On-going           OT Long Term Goals - 08/07/15 1507    OT LONG TERM GOAL #1   Title Independent with updated HEP (ALL LTG's due 09/20/15)   Time 8   Period Weeks   Status On-going   OT LONG TERM GOAL #2   Title Independent with simple cold meal prep and microwaveable items   Baseline dependent   Time 8   Period Weeks   Status On-going   OT LONG TERM GOAL #3   Title Improve coordination as evidenced by performing 9 hole peg test in 60 sec. or under Lt hand   Baseline 93.37 sec   Time 8   Period Weeks   Status On-going   OT LONG TERM GOAL #4   Title Pt to perform enviornmental scanning with 90% accuracy or greater   Time 8   Period Weeks   Status On-going   OT LONG TERM GOAL #5   Title Pt to return to simple financial management and medication management with supervision/cueing prn for accuracy/safety   Time 8   Period Weeks   Status On-going               Plan - 08/07/15 1502    Clinical Impression Statement Pt progressing towards OT goals, continue plan of care for now. Pt continues to be limited by decreased cognition and not being able to fully follow instructions/directions provided by therapist. Patient's daughter states that patient's memory comes and goes.    Pt will benefit from skilled therapeutic intervention in order to improve on the following deficits (Retired) Decreased coordination;Decreased endurance;Decreased Engineer, building servicessafety awareness;Impaired sensation;Decreased knowledge of precautions;Decreased activity  tolerance;Decreased balance;Decreased knowledge of use of DME;Impaired UE functional use;Decreased cognition;Decreased mobility;Decreased strength;Impaired perceived functional ability;Impaired vision/preception;Pain   Rehab Potential Good   OT Frequency 2x / week   OT Duration 8 weeks  plus evaluation   OT Treatment/Interventions [place]-care/ADL training;Therapeutic exercise;Functional Mobility Training;Patient/family education;Neuromuscular education;Manual Therapy;Energy conservation;Therapeutic exercises;DME and/or AE instruction;Therapeutic activities;Cognitive remediation/compensation;Passive range of motion;Visual/perceptual remediation/compensation;Moist Heat   Plan review coordination/putty HEP prn, issue additional visual stragegies and home activities (word searches, mazes, etc), continue education on  left inattention and safety considerations of LUE d/t lack of sensation   OT Home Exercise Plan coordination and putty HEP 07/28/15.    Consulted and Agree with Plan of Care Patient;Family member/caregiver   Family Member Consulted daughter        Problem List Patient Active Problem List   Diagnosis Date Noted  . Left-sided neglect 07/01/2015  . History of right MCA stroke 07/01/2015  . Altered sensation due to stroke 04/29/2015  . Cognitive deficit, post-stroke 04/29/2015  . Pseudogout of left wrist 03/28/2015  . Hyperlipidemia LDL goal <70 03/26/2015  . Hyperglycemia 03/26/2015  . 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 Embolic stroke (HCC)   . Left hemiplegia (HCC)   . Homonymous hemianopia   . Chest wall trauma 01/11/2014  . Injury of left lower arm 01/11/2014  . Headache(784.0) 01/04/2013  . Allergic rhinitis, cause unspecified 01/04/2013  . Anemia 04/19/2012  . Epistaxis, recurrent 04/19/2012  . Hypothyroidism 01/01/2008  . Essential hypertension 01/01/2008  . HYPERTENSION 01/01/2008  . MYOCARDIAL INFARCTION, ACUTE, ANTERIOR WALL  01/01/2008  . LEG CRAMPS 01/01/2008  . ADENOCARCINOMA, BREAST, HX OF 01/01/2008  . DIVERTICULOSIS, COLON, HX OF 01/01/2008  . ARTHRITIS, HX OF 01/01/2008  . RADIATION THERAPY, HX OF 01/01/2008  . CATARACT EXTRACTION, HX OF 01/01/2008  . CHOLECYSTECTOMY, LAPAROSCOPIC, HX OF 01/01/2008    Edwin Cap , MS, OTR/L, CLT Pager: [phone]  08/07/2015, 3:09 PM  [facility] [address] [place], [place], [address] Phone: [phone]   Fax:  [phone]  Name: Leah Figueroa MRN: [id number] Date of Birth: 02/19/1923

## 2015-08-11 NOTE — Therapy (Signed)
[facility] [address] [place], [place], [address] Phone: [phone]   Fax:  [phone]  Physical Therapy Treatment  Patient Details  Name: Leah Figueroa MRN: [id number] Date of Birth: 1923/05/12 Referring Provider: Claudette Laws, MD  Encounter Date: 08/07/2015   08/07/15 1453  PT Visits / Re-Eval  Visit Number 4  Number of Visits 16  Date for PT Re-Evaluation 08/20/14  Authorization  Authorization Type Medicare G-Code & progress note every 10 visits  PT Time Calculation  PT Start Time 1450  PT Stop Time 1530  PT Time Calculation (min) 40 min  PT - End of Session  Equipment Utilized During Treatment Gait belt  Activity Tolerance Patient tolerated treatment well  Behavior During Therapy [facility] for tasks assessed/performed     Past Medical History  Diagnosis Date  . Other specified personal history presenting hazards to health(V15.89)   . Personal history of arthritis   . Unspecified personal history presenting hazards to health   . Cramp of limb   . Acute myocardial infarction of other anterior wall, episode of care unspecified   . Personal history of malignant neoplasm of breast   . Unspecified essential hypertension   . Personal history of other diseases of digestive system   . Unspecified hypothyroidism   . Other and unspecified hyperlipidemia   . Stroke [facility]) 03/2015    "left sided issues"    Past Surgical History  Procedure Laterality Date  . Right thyroid lobectomy    . Cataract extraction      left-2006, right 2011  . Intrathoracic goiter resection    . Cholecystectomy    . Lumpectomy for breast cancer Left 80's    radiation therapy  . Back surgery      There were no vitals filed for this visit.  Visit Diagnosis:  Abnormality of gait  Weakness due to cerebrovascular accident  Unsteadiness  Balance problems  Decreased functional activity tolerance     08/07/15 1452   Symptoms/Limitations  Subjective No new complaints. No falls to report. No pain reported. Pt says ankle feels better.  Pain Assessment  Currently in Pain? No/denies  Pain Score 0          Balance Exercises - 08/07/15 1630   OTAGO PROGRAM   Head Movements Sitting;5 reps   Neck Movements Sitting;5 reps  multimodal cues on correct technique   Back Extension Standing;5 reps   Trunk Movements Standing;5 reps  with intermittent walker support   Ankle Movements Sitting;10 reps   Knee Extensor 10 reps   Knee Flexor 10 reps   Hip ABductor 10 reps   Ankle Plantorflexors 20 reps, support   Ankle Dorsiflexors 20 reps, support   Knee Bends 10 reps, support   Backwards Walking Support   Walking and Turning Around Assistive device  intermittent counter top use   Sideways Walking Assistive device   Tandem Stance 10 seconds, support   Tandem Walk Support   One Leg Stand 10 seconds, support             PT Short Term Goals - 07/22/15 1145    PT SHORT TERM GOAL #1   Title Patient demonstrates understanding of inital HEP. (Target Date: 08/20/2014)   Time 1   Period Months   Status New   PT SHORT TERM GOAL #2   Title Timed Up & Go without device with supervision <18sec. (Target Date: 08/20/2014)   Time 1   Period Months   Status  New   PT SHORT TERM GOAL #3   Title Paitent ambulates 300' with rollator walker with supervision. (Target Date: 08/20/2014)   Time 1   Period Months   Status New           PT Long Term Goals - 07/22/15 1145    PT LONG TERM GOAL #1   Title Berg Balance >45/56 to indicate lower fall risk (Target Date: 09/18/2014)   Time 2   Period Months   Status New   PT LONG TERM GOAL #2   Title Timed Up & Go without device safely <15 sec. (Target Date: 09/18/2014)   Time 2   Period Months   Status New   PT LONG TERM GOAL #3   Title Patient ambulates 400' with rollator walker with supervision for community mobility. (Target Date: 09/18/2014)   Time 2   Period  Months   Status New   PT LONG TERM GOAL #4   Title Patient ambulates 100' around furniture with LRAD modified independent. (Target Date: 09/18/2014)   Time 2   Status New   PT LONG TERM GOAL #5   Title Patient negotiates ramps, curbs with rollator walker and stairs with 1 rail with supervision. (Target Date: 09/18/2014)   Time 2   Period Months   Status New        08/07/15 1453  Plan  Clinical Impression Statement Advanced pt's OTAGO program after review of current ex's being performed. Provided cues to correct ex form and technique. Pt making steady progress toward goals.  Pt will benefit from skilled therapeutic intervention in order to improve on the following deficits Abnormal gait;Decreased activity tolerance;Decreased balance;Decreased endurance;Decreased mobility;Decreased strength;Postural dysfunction;Decreased safety awareness  Rehab Potential Good  PT Frequency 2x / week  PT Duration 8 weeks  PT Treatment/Interventions ADLs/[facility];DME Instruction;Gait training;Stair training;Functional mobility training;Therapeutic activities;Therapeutic exercise;Balance training;Neuromuscular re-education;Patient/family education  PT Next Visit Plan Gait with standard RW & rollator walker for comparison, add remainng OTAGO ex's in next sesson or two.  Consulted and Agree with Plan of Care Patient;Family member/caregiver  Family Member Consulted pt's dtr      Problem List Patient Active Problem List   Diagnosis Date Noted  . Left-sided neglect 07/01/2015  . History of right MCA stroke 07/01/2015  . Altered sensation due to stroke 04/29/2015  . Cognitive deficit, post-stroke 04/29/2015  . Pseudogout of left wrist 03/28/2015  . Hyperlipidemia LDL goal <70 03/26/2015  . Hyperglycemia 03/26/2015  . 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 Embolic stroke (HCC)   . Left hemiplegia (HCC)   . Homonymous hemianopia   . Chest wall trauma  01/11/2014  . Injury of left lower arm 01/11/2014  . Headache(784.0) 01/04/2013  . Allergic rhinitis, cause unspecified 01/04/2013  . Anemia 04/19/2012  . Epistaxis, recurrent 04/19/2012  . Hypothyroidism 01/01/2008  . Essential hypertension 01/01/2008  . HYPERTENSION 01/01/2008  . MYOCARDIAL INFARCTION, ACUTE, ANTERIOR WALL 01/01/2008  . LEG CRAMPS 01/01/2008  . ADENOCARCINOMA, BREAST, HX OF 01/01/2008  . DIVERTICULOSIS, COLON, HX OF 01/01/2008  . ARTHRITIS, HX OF 01/01/2008  . RADIATION THERAPY, HX OF 01/01/2008  . CATARACT EXTRACTION, HX OF 01/01/2008  . CHOLECYSTECTOMY, LAPAROSCOPIC, HX OF 01/01/2008    Sallyanne KusterBury, Verne Lanuza 08/11/2015, 11:28 PM  Sallyanne KusterKathy Atif Chapple, PTA, [facility] [address], [address] [place], [place] [address] [phone] 08/11/2015, 11:32 PM   Name: Leah Figueroa MRN: [id number] Date of Birth: 1922-09-19

## 2015-08-12 ENCOUNTER — Encounter: Payer: Self-pay | Admitting: Physical Therapy

## 2015-08-12 ENCOUNTER — Ambulatory Visit: Payer: Medicare Other | Admitting: Physical Therapy

## 2015-08-12 ENCOUNTER — Ambulatory Visit: Payer: Medicare Other | Admitting: Occupational Therapy

## 2015-08-12 VITALS — BP 174/80 | HR 59

## 2015-08-12 DIAGNOSIS — R2681 Unsteadiness on feet: Secondary | ICD-10-CM

## 2015-08-12 DIAGNOSIS — H539 Unspecified visual disturbance: Secondary | ICD-10-CM

## 2015-08-12 DIAGNOSIS — R293 Abnormal posture: Secondary | ICD-10-CM

## 2015-08-12 DIAGNOSIS — I69398 Other sequelae of cerebral infarction: Secondary | ICD-10-CM

## 2015-08-12 DIAGNOSIS — R4189 Other symptoms and signs involving cognitive functions and awareness: Secondary | ICD-10-CM

## 2015-08-12 DIAGNOSIS — R201 Hypoesthesia of skin: Secondary | ICD-10-CM | POA: Diagnosis not present

## 2015-08-12 DIAGNOSIS — IMO0002 Reserved for concepts with insufficient information to code with codable children: Secondary | ICD-10-CM

## 2015-08-12 DIAGNOSIS — I69998 Other sequelae following unspecified cerebrovascular disease: Secondary | ICD-10-CM | POA: Diagnosis not present

## 2015-08-12 DIAGNOSIS — I6980 Unspecified sequelae of other cerebrovascular disease: Secondary | ICD-10-CM | POA: Diagnosis not present

## 2015-08-12 DIAGNOSIS — R29898 Other symptoms and signs involving the musculoskeletal system: Secondary | ICD-10-CM

## 2015-08-12 DIAGNOSIS — M6289 Other specified disorders of muscle: Secondary | ICD-10-CM | POA: Diagnosis not present

## 2015-08-12 DIAGNOSIS — R279 Unspecified lack of coordination: Secondary | ICD-10-CM | POA: Diagnosis not present

## 2015-08-12 DIAGNOSIS — R269 Unspecified abnormalities of gait and mobility: Secondary | ICD-10-CM

## 2015-08-12 DIAGNOSIS — R6889 Other general symptoms and signs: Secondary | ICD-10-CM

## 2015-08-12 DIAGNOSIS — R2689 Other abnormalities of gait and mobility: Secondary | ICD-10-CM

## 2015-08-12 NOTE — Therapy (Signed)
[facility] [address] [place], [place], [address] Phone: [phone]   Fax:  [phone]  Physical Therapy Treatment  Patient Details  Name: Consepcion Utt Figueroa MRN: [id number] Date of Birth: July 31, 1923 Referring Provider: Claudette Laws, MD  Encounter Date: 08/12/2015      PT End of Session - 08/12/15 1230    Visit Number 5   Number of Visits 16   Date for PT Re-Evaluation 08/20/14   Authorization Type Medicare G-Code & progress note every 10 visits   PT Start Time 1145   PT Stop Time 1230   PT Time Calculation (min) 45 min   Equipment Utilized During Treatment Gait belt   Activity Tolerance Patient tolerated treatment well   Behavior During Therapy [facility] for tasks assessed/performed      Past Medical History  Diagnosis Date  . Other specified personal history presenting hazards to health(V15.89)   . Personal history of arthritis   . Unspecified personal history presenting hazards to health   . Cramp of limb   . Acute myocardial infarction of other anterior wall, episode of care unspecified   . Personal history of malignant neoplasm of breast   . Unspecified essential hypertension   . Personal history of other diseases of digestive system   . Unspecified hypothyroidism   . Other and unspecified hyperlipidemia   . Stroke [facility]) 03/2015    "left sided issues"    Past Surgical History  Procedure Laterality Date  . Right thyroid lobectomy    . Cataract extraction      left-2006, right 2011  . Intrathoracic goiter resection    . Cholecystectomy    . Lumpectomy for breast cancer Left 80's    radiation therapy  . Back surgery      Filed Vitals:   08/12/15 1212  BP: 174/80  Pulse: 59  SpO2: 95%    Visit Diagnosis:  Abnormality of gait  Weakness due to cerebrovascular accident  Unsteadiness  Balance problems  Decreased functional activity tolerance  Posture abnormality      Subjective  Assessment - 08/12/15 1150    Subjective No issues or falls. She has done her exercises without issues.   Currently in Pain? No/denies                              Balance Exercises - 08/12/15 1145    OTAGO PROGRAM   Head Movements Sitting;5 reps   Neck Movements 5 reps  supine   Back Extension Standing;5 reps  holding RW   Trunk Movements Standing;5 reps  Holding RW   Ankle Movements Sitting;10 reps   Knee Extensor 10 reps   Knee Flexor 10 reps  Holding RW   Hip ABductor 10 reps  Holding RW   Ankle Plantorflexors 20 reps, support  RW   Ankle Dorsiflexors 20 reps, support  RW   Knee Bends 10 reps, support  RW   Backwards Walking Support  RW   Walking and Turning Around Assistive device  RW   Sideways Walking Assistive device  counter   Tandem Stance 10 seconds, support  hovering over counter   Tandem Walk Support  RW MinA   One Leg Stand 10 seconds, support  counter hovering over    Heel Walking Support  RW   Toe Walk Support  RW   Sit to Stand 10 reps, no support  PT Education - 08/12/15 1230    Education provided Yes   Education Details [place] HEP (Can be broken up during her day)   Person(s) Educated Patient;Child(ren)   Methods Explanation;Demonstration;Tactile cues;Verbal cues;Handout   Comprehension Verbalized understanding;Returned demonstration;Verbal cues required;Tactile cues required;Need further instruction          PT Short Term Goals - 07/22/15 1145    PT SHORT TERM GOAL #1   Title Patient demonstrates understanding of inital HEP. (Target Date: 08/20/2014)   Time 1   Period Months   Status New   PT SHORT TERM GOAL #2   Title Timed Up & Go without device with supervision <18sec. (Target Date: 08/20/2014)   Time 1   Period Months   Status New   PT SHORT TERM GOAL #3   Title Paitent ambulates 300' with rollator walker with supervision. (Target Date: 08/20/2014)   Time 1   Period Months   Status New            PT Long Term Goals - 07/22/15 1145    PT LONG TERM GOAL #1   Title Berg Balance >45/56 to indicate lower fall risk (Target Date: 09/18/2014)   Time 2   Period Months   Status New   PT LONG TERM GOAL #2   Title Timed Up & Go without device safely <15 sec. (Target Date: 09/18/2014)   Time 2   Period Months   Status New   PT LONG TERM GOAL #3   Title Patient ambulates 400' with rollator walker with supervision for community mobility. (Target Date: 09/18/2014)   Time 2   Period Months   Status New   PT LONG TERM GOAL #4   Title Patient ambulates 100' around furniture with LRAD modified independent. (Target Date: 09/18/2014)   Time 2   Status New   PT LONG TERM GOAL #5   Title Patient negotiates ramps, curbs with rollator walker and stairs with 1 rail with supervision. (Target Date: 09/18/2014)   Time 2   Period Months   Status New               Plan - 08/12/15 1230    Clinical Impression Statement Patient's vital signs were same as her baseline with pressure. She reports she does not have a gall bladder and ate a cheese biscuit for breakfast. The pressure resolved with <5 minutes of lying supine. PT informed pt & dtr to contact "911" or MD if it came back. They report she had this same issue in the nursing home with extensive test run and found nothing. [place] to understand [place].    Pt will benefit from skilled therapeutic intervention in order to improve on the following deficits Abnormal gait;Decreased activity tolerance;Decreased balance;Decreased endurance;Decreased mobility;Decreased strength;Postural dysfunction;Decreased safety awareness   Rehab Potential Good   PT Frequency 2x / week   PT Duration 8 weeks   PT Treatment/Interventions ADLs/[facility];DME Instruction;Gait training;Stair training;Functional mobility training;Therapeutic activities;Therapeutic exercise;Balance training;Neuromuscular re-education;Patient/family education   PT Next  Visit Plan Gait with standard RW & rollator walker for comparison, check OTAGO    Consulted and Agree with Plan of Care Patient;Family member/caregiver   Family Member Consulted pt's dtr        Problem List Patient Active Problem List   Diagnosis Date Noted  . Left-sided neglect 07/01/2015  . History of right MCA stroke 07/01/2015  . Altered sensation due to stroke 04/29/2015  . Cognitive deficit, post-stroke 04/29/2015  . Pseudogout of  left wrist 03/28/2015  . Hyperlipidemia LDL goal <70 03/26/2015  . Hyperglycemia 03/26/2015  . 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 Embolic stroke (HCC)   . Left hemiplegia (HCC)   . Homonymous hemianopia   . Chest wall trauma 01/11/2014  . Injury of left lower arm 01/11/2014  . Headache(784.0) 01/04/2013  . Allergic rhinitis, cause unspecified 01/04/2013  . Anemia 04/19/2012  . Epistaxis, recurrent 04/19/2012  . Hypothyroidism 01/01/2008  . Essential hypertension 01/01/2008  . HYPERTENSION 01/01/2008  . MYOCARDIAL INFARCTION, ACUTE, ANTERIOR WALL 01/01/2008  . LEG CRAMPS 01/01/2008  . ADENOCARCINOMA, BREAST, HX OF 01/01/2008  . DIVERTICULOSIS, COLON, HX OF 01/01/2008  . ARTHRITIS, HX OF 01/01/2008  . RADIATION THERAPY, HX OF 01/01/2008  . CATARACT EXTRACTION, HX OF 01/01/2008  . CHOLECYSTECTOMY, LAPAROSCOPIC, HX OF 01/01/2008    Vladimir Faster PT, DPT 08/12/2015, 1:12 PM  [facility] [address] [place], [place], [address] Phone: [phone]   Fax:  [phone]  Name: Leah Figueroa MRN: [id number] Date of Birth: June 01, 1923

## 2015-08-12 NOTE — Therapy (Signed)
[facility] [address] [place], [place], [address] Phone: [phone]   Fax:  [phone]  Occupational Therapy Treatment  Patient Details  Name: Leah Figueroa MRN: [id number] Date of Birth: Jun 08, 1923 Referring Provider: Dr. Claudette Laws  Encounter Date: 08/12/2015      OT End of Session - 08/12/15 1130    Visit Number 5   Number of Visits 17   Date for OT Re-Evaluation 09/20/15   Authorization Type MCR - G code needed   Authorization - Visit Number 5   Authorization - Number of Visits 10   OT Start Time 1106   OT Stop Time 1145   OT Time Calculation (min) 39 min   Activity Tolerance Patient tolerated treatment well   Behavior During Therapy [facility] for tasks assessed/performed      Past Medical History  Diagnosis Date  . Other specified personal history presenting hazards to health(V15.89)   . Personal history of arthritis   . Unspecified personal history presenting hazards to health   . Cramp of limb   . Acute myocardial infarction of other anterior wall, episode of care unspecified   . Personal history of malignant neoplasm of breast   . Unspecified essential hypertension   . Personal history of other diseases of digestive system   . Unspecified hypothyroidism   . Other and unspecified hyperlipidemia   . Stroke [facility]) 03/2015    "left sided issues"    Past Surgical History  Procedure Laterality Date  . Right thyroid lobectomy    . Cataract extraction      left-2006, right 2011  . Intrathoracic goiter resection    . Cholecystectomy    . Lumpectomy for breast cancer Left 80's    radiation therapy  . Back surgery      There were no vitals filed for this visit.  Visit Diagnosis:  Lack of coordination due to stroke  Weakness of left hand  Impaired sensation  Impaired cognition  Vision disturbance following cerebrovascular accident      Subjective Assessment - 08/12/15 1109    Subjective  "I played a game yesterday that was good therapy"   Pertinent History back surgery, breast CA   Limitations HOH   Patient Stated Goals to drop less from Lt hand   Currently in Pain? No/denies                      OT Treatments/Exercises (OP) - 08/12/15 0001    Visual/Perceptual Exercises   Copy this Image Pegboard   Pegboard Pt copied medium peg design (simple) with min-mod cues, Placed pegs in pegboard with L hand with min diffculty.                                                                                               OT Education - 08/12/15 1134    Education Details Reviewed yellow putty HEP and coordination HEP--pt returned demo with min v.c./demo   Person(s) Educated Patient   Methods Explanation;Demonstration;Verbal cues   Comprehension Verbalized understanding;Returned demonstration;Verbal cues required  OT Short Term Goals - 08/12/15 1136    OT SHORT TERM GOAL #1   Title Indepndent with HEP for Lt hand coordination and strength (all STG's due 08/21/15)    Time 4   Period Weeks   Status On-going  08/12/15:  needs min v.c./prompts   OT SHORT TERM GOAL #2   Title Pt to verbalize understanding with visual strategies for Lt inattention and safety considerations with impaired sensation LUE   Time 4   Period Weeks   Status On-going   OT SHORT TERM GOAL #3   Title Indpependent with all BADLS   Time 4   Period Weeks   Status On-going   OT SHORT TERM GOAL #4   Title Improve grip strength Lt hand to 25 lbs or greater to assist with opening containers   Baseline Lt = 19 (Rt = 34 lbs)    Time 4   Period Weeks   Status On-going   OT SHORT TERM GOAL #5   Title Improve coordination as evidenced by performing 9 hole peg test in 75 sec. or less Lt hand   Baseline 93.37 sec.    Time 4   Period Weeks   Status On-going           OT Long Term Goals - 08/07/15 1507    OT LONG TERM GOAL #1   Title  Independent with updated HEP (ALL LTG's due 09/20/15)   Time 8   Period Weeks   Status On-going   OT LONG TERM GOAL #2   Title Independent with simple cold meal prep and microwaveable items   Baseline dependent   Time 8   Period Weeks   Status On-going   OT LONG TERM GOAL #3   Title Improve coordination as evidenced by performing 9 hole peg test in 60 sec. or under Lt hand   Baseline 93.37 sec   Time 8   Period Weeks   Status On-going   OT LONG TERM GOAL #4   Title Pt to perform enviornmental scanning with 90% accuracy or greater   Time 8   Period Weeks   Status On-going   OT LONG TERM GOAL #5   Title Pt to return to simple financial management and medication management with supervision/cueing prn for accuracy/safety   Time 8   Period Weeks   Status On-going               Plan - 08/12/15 1114    Clinical Impression Statement Limited ability to review education today due to no caregiver/daughter present for today's session.   Cognition continues to be a barrier as pt needs prompts/cues for HEP.  However, pt appears to be progressing with improved coordination.  Did not issue further homwork as pt did not complete previously given word search yet.   Plan begin checking STGs, review visual strategies/home activities if caregiver present, review/continue education re:  L inattention/safety due to decr sensation prn, coodination, strength   OT Home Exercise Plan coordination and putty HEP 07/28/15.    Consulted and Agree with Plan of Care Patient        Problem List Patient Active Problem List   Diagnosis Date Noted  . Left-sided neglect 07/01/2015  . History of right MCA stroke 07/01/2015  . Altered sensation due to stroke 04/29/2015  . Cognitive deficit, post-stroke 04/29/2015  . Pseudogout of left wrist 03/28/2015  . Hyperlipidemia LDL goal <70 03/26/2015  . Hyperglycemia 03/26/2015  . Thrombotic stroke involving right  middle cerebral artery (HCC) 03/26/2015  .  Embolic stroke (HCC)   . Left hemiplegia (HCC)   . Homonymous hemianopia   . Chest wall trauma 01/11/2014  . Injury of left lower arm 01/11/2014  . Headache(784.0) 01/04/2013  . Allergic rhinitis, cause unspecified 01/04/2013  . Anemia 04/19/2012  . Epistaxis, recurrent 04/19/2012  . Hypothyroidism 01/01/2008  . Essential hypertension 01/01/2008  . HYPERTENSION 01/01/2008  . MYOCARDIAL INFARCTION, ACUTE, ANTERIOR WALL 01/01/2008  . LEG CRAMPS 01/01/2008  . ADENOCARCINOMA, BREAST, HX OF 01/01/2008  . DIVERTICULOSIS, COLON, HX OF 01/01/2008  . ARTHRITIS, HX OF 01/01/2008  . RADIATION THERAPY, HX OF 01/01/2008  . CATARACT EXTRACTION, HX OF 01/01/2008  . CHOLECYSTECTOMY, LAPAROSCOPIC, HX OF 01/01/2008    [facility] 08/12/2015, 11:43 AM  [facility] [address] [place], [place], [address] Phone: [phone]   Fax:  [phone]  Name: Tynika Luddy Teuscher MRN: [id number] Date of Birth: 30-Jun-1923  Willa Frater, OTR/L 08/12/2015 11:44 AM

## 2015-08-14 ENCOUNTER — Encounter: Payer: Self-pay | Admitting: Physical Therapy

## 2015-08-14 ENCOUNTER — Ambulatory Visit: Payer: Medicare Other | Admitting: Physical Therapy

## 2015-08-14 ENCOUNTER — Ambulatory Visit: Payer: Medicare Other | Admitting: Occupational Therapy

## 2015-08-14 DIAGNOSIS — H539 Unspecified visual disturbance: Secondary | ICD-10-CM

## 2015-08-14 DIAGNOSIS — IMO0002 Reserved for concepts with insufficient information to code with codable children: Secondary | ICD-10-CM

## 2015-08-14 DIAGNOSIS — I69998 Other sequelae following unspecified cerebrovascular disease: Secondary | ICD-10-CM | POA: Diagnosis not present

## 2015-08-14 DIAGNOSIS — R201 Hypoesthesia of skin: Secondary | ICD-10-CM | POA: Diagnosis not present

## 2015-08-14 DIAGNOSIS — R293 Abnormal posture: Secondary | ICD-10-CM

## 2015-08-14 DIAGNOSIS — I6980 Unspecified sequelae of other cerebrovascular disease: Secondary | ICD-10-CM | POA: Diagnosis not present

## 2015-08-14 DIAGNOSIS — R2689 Other abnormalities of gait and mobility: Secondary | ICD-10-CM

## 2015-08-14 DIAGNOSIS — R4189 Other symptoms and signs involving cognitive functions and awareness: Secondary | ICD-10-CM

## 2015-08-14 DIAGNOSIS — M6289 Other specified disorders of muscle: Secondary | ICD-10-CM | POA: Diagnosis not present

## 2015-08-14 DIAGNOSIS — R6889 Other general symptoms and signs: Secondary | ICD-10-CM

## 2015-08-14 DIAGNOSIS — R279 Unspecified lack of coordination: Secondary | ICD-10-CM | POA: Diagnosis not present

## 2015-08-14 DIAGNOSIS — R269 Unspecified abnormalities of gait and mobility: Secondary | ICD-10-CM

## 2015-08-14 DIAGNOSIS — R29898 Other symptoms and signs involving the musculoskeletal system: Secondary | ICD-10-CM

## 2015-08-14 DIAGNOSIS — I69398 Other sequelae of cerebral infarction: Secondary | ICD-10-CM

## 2015-08-14 DIAGNOSIS — Z7409 Other reduced mobility: Secondary | ICD-10-CM

## 2015-08-14 DIAGNOSIS — R2681 Unsteadiness on feet: Secondary | ICD-10-CM

## 2015-08-14 NOTE — Patient Instructions (Signed)
   Functional activities to try at home:  1.  Button/unbutton shirt 2. Sort silverware 3. Fold towels, hand towels, wash cloths 4. Match socks

## 2015-08-14 NOTE — Therapy (Signed)
[facility] [address] [place], [place], [address] Phone: [phone]   Fax:  [phone]  Occupational Therapy Treatment  Patient Details  Name: Leah Figueroa MRN: [id number] Date of Birth: April 19, 1923 Referring Provider: Dr. Claudette Laws  Encounter Date: 08/14/2015      OT End of Session - 08/14/15 1504    Visit Number 6   Number of Visits 17   Date for OT Re-Evaluation 09/20/15   Authorization Type MCR - G code needed   Authorization - Visit Number 6   Authorization - Number of Visits 10   OT Start Time 1448   OT Stop Time 1530   OT Time Calculation (min) 42 min   Activity Tolerance Patient tolerated treatment well   Behavior During Therapy [facility] for tasks assessed/performed      Past Medical History  Diagnosis Date  . Other specified personal history presenting hazards to health(V15.89)   . Personal history of arthritis   . Unspecified personal history presenting hazards to health   . Cramp of limb   . Acute myocardial infarction of other anterior wall, episode of care unspecified   . Personal history of malignant neoplasm of breast   . Unspecified essential hypertension   . Personal history of other diseases of digestive system   . Unspecified hypothyroidism   . Other and unspecified hyperlipidemia   . Stroke [facility]) 03/2015    "left sided issues"    Past Surgical History  Procedure Laterality Date  . Right thyroid lobectomy    . Cataract extraction      left-2006, right 2011  . Intrathoracic goiter resection    . Cholecystectomy    . Lumpectomy for breast cancer Left 80's    radiation therapy  . Back surgery      There were no vitals filed for this visit.  Visit Diagnosis:  Lack of coordination due to stroke  Weakness of left hand  Impaired sensation  Impaired cognition  Vision disturbance following cerebrovascular accident      Subjective Assessment - 08/14/15 1710     Subjective  Daughter reports that pt has difficulty with clothing orientation and needs help pulling up pants on L side/clothing adjustment   Patient is accompained by: Family member  dtr   Pertinent History back surgery, breast CA   Limitations HOH   Patient Stated Goals to drop less from Lt hand   Currently in Pain? No/denies                      OT Treatments/Exercises (OP) - 08/14/15 0001    ADLs   UB Dressing Buttoning shirt on tabletop for incr fine motor coordination, bilateral hand use, and for dressing.  Donning/doffing jacket:  pt doffed independently, but needed mod cueing/min A due to confusion (needed prompts after putting on L arm and perseverated).  Pt able to zip jacket independently.   Home Maintenance Folding towels in standing with min cueing intermittently for L side awareness, but pt then able to [place] correct.  Sorting silverware with L hand with incr time.    Visual/Perceptual Exercises   Copy this Image Pegboard   Pegboard Pt copied small peg design with min cueing (simple/basic design) and was able to [place]-correct with questioning cues.  Pt placed small pegs in L hand with min to occasional mod difficulty for coordination, but improved in-hand coordination overall.  OT Education - 08/14/15 1714    Education Details Functional activities to help with visual perceptual skills, cognition, and LUE functional use;   Recommended that dtr give pt incr time to process instructions and to attempt to [place]-correct (questioning cues) as able/f safe   Person(s) Educated Patient;Child(ren)   Methods Explanation;Demonstration;Handout;Verbal cues   Comprehension Verbalized understanding;Returned demonstration;Verbal cues required          OT Short Term Goals - 08/14/15 1451    OT SHORT TERM GOAL #1   Title Indepndent with HEP for Lt hand coordination and strength (all STG's due 08/21/15)    Time 4   Period Weeks   Status On-going   08/12/15:  needs min v.c./prompts   OT SHORT TERM GOAL #2   Title Pt to verbalize understanding with visual strategies for Lt inattention and safety considerations with impaired sensation LUE   Time 4   Period Weeks   Status On-going   OT SHORT TERM GOAL #3   Title Indpependent with all BADLS   Time 4   Period Weeks   Status On-going   OT SHORT TERM GOAL #4   Title Improve grip strength Lt hand to 25 lbs or greater to assist with opening containers   Baseline Lt = 19 (Rt = 34 lbs)    Time 4   Period Weeks   Status On-going   OT SHORT TERM GOAL #5   Title Improve coordination as evidenced by performing 9 hole peg test in 75 sec. or less Lt hand   Baseline 93.37 sec.    Time 4   Period Weeks   Status On-going           OT Long Term Goals - 08/07/15 1507    OT LONG TERM GOAL #1   Title Independent with updated HEP (ALL LTG's due 09/20/15)   Time 8   Period Weeks   Status On-going   OT LONG TERM GOAL #2   Title Independent with simple cold meal prep and microwaveable items   Baseline dependent   Time 8   Period Weeks   Status On-going   OT LONG TERM GOAL #3   Title Improve coordination as evidenced by performing 9 hole peg test in 60 sec. or under Lt hand   Baseline 93.37 sec   Time 8   Period Weeks   Status On-going   OT LONG TERM GOAL #4   Title Pt to perform enviornmental scanning with 90% accuracy or greater   Time 8   Period Weeks   Status On-going   OT LONG TERM GOAL #5   Title Pt to return to simple financial management and medication management with supervision/cueing prn for accuracy/safety   Time 8   Period Weeks   Status On-going               Plan - 08/14/15 1721    Clinical Impression Statement Cognition continues to ba a barrier during functional activities including dressing.  Pt was able to fold towels and sort silverware with incr time.   Plan check STGs next week/review education, ? shedule more appts   Consulted and Agree with Plan  of Care Patient;Family member/caregiver   Family Member Consulted daughter        Problem List Patient Active Problem List   Diagnosis Date Noted  . Left-sided neglect 07/01/2015  . History of right MCA stroke 07/01/2015  . Altered sensation due to stroke 04/29/2015  . Cognitive deficit, post-stroke 04/29/2015  .  Pseudogout of left wrist 03/28/2015  . Hyperlipidemia LDL goal <70 03/26/2015  . Hyperglycemia 03/26/2015  . 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 Embolic stroke (HCC)   . Left hemiplegia (HCC)   . Homonymous hemianopia   . Chest wall trauma 01/11/2014  . Injury of left lower arm 01/11/2014  . Headache(784.0) 01/04/2013  . Allergic rhinitis, cause unspecified 01/04/2013  . Anemia 04/19/2012  . Epistaxis, recurrent 04/19/2012  . Hypothyroidism 01/01/2008  . Essential hypertension 01/01/2008  . HYPERTENSION 01/01/2008  . MYOCARDIAL INFARCTION, ACUTE, ANTERIOR WALL 01/01/2008  . LEG CRAMPS 01/01/2008  . ADENOCARCINOMA, BREAST, HX OF 01/01/2008  . DIVERTICULOSIS, COLON, HX OF 01/01/2008  . ARTHRITIS, HX OF 01/01/2008  . RADIATION THERAPY, HX OF 01/01/2008  . CATARACT EXTRACTION, HX OF 01/01/2008  . CHOLECYSTECTOMY, LAPAROSCOPIC, HX OF 01/01/2008    [facility] Out[facility] SurgeryFREEMAN,Natoria Archibald 08/14/2015, 5:35 PM  [facility] [address] [place], [place], [address] Phone: [phone]   Fax:  [phone]  Name: Leah Figueroa MRN: [id number] Date of Birth: 03-26-1923  Willa Fraterngela Reyah Streeter, OTR/L 08/14/2015 5:35 PM

## 2015-08-15 NOTE — Therapy (Signed)
[facility] [address] [place], [place], [address] Phone: [phone]   Fax:  [phone]  Physical Therapy Treatment  Patient Details  Name: Leah Figueroa MRN: [id number] Date of Birth: [birth year] Referring Provider: Claudette Laws, MD  Encounter Date: 08/14/2015      PT End of Session - 08/14/15 1409    Visit Number 6   Number of Visits 16   Date for PT Re-Evaluation 08/20/14   Authorization Type Medicare G-Code & progress note every 10 visits   PT Start Time 1402   PT Stop Time 1445   PT Time Calculation (min) 43 min   Equipment Utilized During Treatment Gait belt   Activity Tolerance Patient tolerated treatment well   Behavior During Therapy [facility] for tasks assessed/performed      Past Medical History  Diagnosis Date  . Other specified personal history presenting hazards to health(V15.89)   . Personal history of arthritis   . Unspecified personal history presenting hazards to health   . Cramp of limb   . Acute myocardial infarction of other anterior wall, episode of care unspecified   . Personal history of malignant neoplasm of breast   . Unspecified essential hypertension   . Personal history of other diseases of digestive system   . Unspecified hypothyroidism   . Other and unspecified hyperlipidemia   . Stroke [facility]) 03/2015    "left sided issues"    Past Surgical History  Procedure Laterality Date  . Right thyroid lobectomy    . Cataract extraction      left-2006, right 2011  . Intrathoracic goiter resection    . Cholecystectomy    . Lumpectomy for breast cancer Left 80's    radiation therapy  . Back surgery      There were no vitals filed for this visit.  Visit Diagnosis:  Lack of coordination due to stroke  Abnormality of gait  Weakness due to cerebrovascular accident  Unsteadiness  Balance problems  Decreased functional activity tolerance  Posture abnormality  Decreased  functional mobility and endurance      Subjective Assessment - 08/14/15 1408    Subjective No new complaints. No falls or pain to report. Has not had an opportunity to perform HEP due to busy schedules (since last PT visit).   Currently in Pain? No/denies   Pain Score 0-No pain          [facility] Adult PT Treatment/Exercise - 08/14/15 1429    Ambulation/Gait   Ambulation/Gait Yes   Ambulation/Gait Assistance 5: Supervision;4: Min guard   Ambulation/Gait Assistance Details cues on posture, walker position and for increased step/stride length with both the walker and rollator.                           Ambulation Distance (Feet) 350 Feet  x1 with rollator; ~400 with RW. ramp/curbs included in dist.   Assistive device Rolling walker;Rollator   Gait Pattern Step-through pattern;Decreased step length - left;Decreased stride length;Decreased dorsiflexion - left;Trunk flexed;Narrow base of support   Ambulation Surface Level;Indoor   Ramp Other (comment);4: Min assist   Ramp Details (indicate cue type and reason) cues on walker/rollator position and technique. cues on brake use with rollator.                               Curb Other (comment);4: Min assist   Curb Details (indicate  cue type and reason) cues on technique with rollator and RW. total assist to advance rollator off and onto curb. min guard assist with cues on technique to advance the RW.                                     Balance Exercises - 08/14/15 1430    Balance Exercises: Standing   Standing Eyes Opened Narrow base of support (BOS);Foam/compliant surface;Other reps (comment);Limitations   Standing Eyes Closed Wide (BOA);Narrow base of support (BOS);Foam/compliant surface;Other reps (comment);Limitations;Other (comment)  oin airex   Balance Exercises: Standing   Standing Eyes Opened Limitations on airex: head nods/shakes x 10 each with up to min assist for balance.   Standing Eyes Closed Limitations on airex, no UE  support: wide base of support with no head movements, then head nods/shakes/diagonals both ways with min to mod assist for balance.narrow base of support: eyes closed no head movements, 15 sec hold x 3 reps. min to mod assist for balance                                               PT Short Term Goals - 07/22/15 1145    PT SHORT TERM GOAL #1   Title Patient demonstrates understanding of inital HEP. (Target Date: 08/20/2014)   Time 1   Period Months   Status New   PT SHORT TERM GOAL #2   Title Timed Up & Go without device with supervision <18sec. (Target Date: 08/20/2014)   Time 1   Period Months   Status New   PT SHORT TERM GOAL #3   Title Paitent ambulates 300' with rollator walker with supervision. (Target Date: 08/20/2014)   Time 1   Period Months   Status New           PT Long Term Goals - 07/22/15 1145    PT LONG TERM GOAL #1   Title Berg Balance >45/56 to indicate lower fall risk (Target Date: 09/18/2014)   Time 2   Period Months   Status New   PT LONG TERM GOAL #2   Title Timed Up & Go without device safely <15 sec. (Target Date: 09/18/2014)   Time 2   Period Months   Status New   PT LONG TERM GOAL #3   Title Patient ambulates 400' with rollator walker with supervision for community mobility. (Target Date: 09/18/2014)   Time 2   Period Months   Status New   PT LONG TERM GOAL #4   Title Patient ambulates 100' around furniture with LRAD modified independent. (Target Date: 09/18/2014)   Time 2   Status New   PT LONG TERM GOAL #5   Title Patient negotiates ramps, curbs with rollator walker and stairs with 1 rail with supervision. (Target Date: 09/18/2014)   Time 2   Period Months   Status New          Plan - 08/14/15 1409    Clinical Impression Statement Pt demo's safe use of both rollator and RW with gait on level surfaces and with ramps/curb negotiation. Needs increased assistance with rollator on curbs to advance it off and onto the curb. Pt's daughter wishes to  persue getting a MD order for a youth RW. Pt making steady progress toward goals.  Pt will benefit from skilled therapeutic intervention in order to improve on the following deficits Abnormal gait;Decreased activity tolerance;Decreased balance;Decreased endurance;Decreased mobility;Decreased strength;Postural dysfunction;Decreased safety awareness   Rehab Potential Good   PT Frequency 2x / week   PT Duration 8 weeks   PT Treatment/Interventions ADLs/[place] [facility] Home Management;DME Instruction;Gait training;Stair training;Functional mobility training;Therapeutic activities;Therapeutic exercise;Balance training;Neuromuscular re-education;Patient/family education   PT Next Visit Plan check STGs; Continue toward LTG's.   Consulted and Agree with Plan of Care Patient;Family member/caregiver   Family Member Consulted pt's dtr        Problem List Patient Active Problem List   Diagnosis Date Noted  . Left-sided neglect 07/01/2015  . History of right MCA stroke 07/01/2015  . Altered sensation due to stroke 04/29/2015  . Cognitive deficit, post-stroke 04/29/2015  . Pseudogout of left wrist 03/28/2015  . Hyperlipidemia LDL goal <70 03/26/2015  . Hyperglycemia 03/26/2015  . 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 Embolic stroke (HCC)   . Left hemiplegia (HCC)   . Homonymous hemianopia   . Chest wall trauma 01/11/2014  . Injury of left lower arm 01/11/2014  . Headache(784.0) 01/04/2013  . Allergic rhinitis, cause unspecified 01/04/2013  . Anemia 04/19/2012  . Epistaxis, recurrent 04/19/2012  . Hypothyroidism 01/01/2008  . Essential hypertension 01/01/2008  . HYPERTENSION 01/01/2008  . MYOCARDIAL INFARCTION, ACUTE, ANTERIOR WALL 01/01/2008  . LEG CRAMPS 01/01/2008  . ADENOCARCINOMA, BREAST, HX OF 01/01/2008  . DIVERTICULOSIS, COLON, HX OF 01/01/2008  . ARTHRITIS, HX OF 01/01/2008  . RADIATION THERAPY, HX OF 01/01/2008  .  CATARACT EXTRACTION, HX OF 01/01/2008  . CHOLECYSTECTOMY, LAPAROSCOPIC, HX OF 01/01/2008    Sallyanne KusterBury, Yacine Droz 08/15/2015, 12:52 PM  Sallyanne KusterKathy Hiral Lukasiewicz, PTA, [facility] [address], [address] [place], [place] [address] [phone] 08/15/2015, 12:54 PM   Name: Leah Figueroa MRN: [id number] Date of Birth: Jul 28, 1923

## 2015-08-19 ENCOUNTER — Ambulatory Visit (INDEPENDENT_AMBULATORY_CARE_PROVIDER_SITE_OTHER): Payer: Medicare Other | Admitting: Neurology

## 2015-08-19 ENCOUNTER — Ambulatory Visit: Payer: Medicare Other | Attending: Physical Medicine & Rehabilitation | Admitting: Physical Therapy

## 2015-08-19 ENCOUNTER — Encounter: Payer: Self-pay | Admitting: Physical Therapy

## 2015-08-19 ENCOUNTER — Ambulatory Visit: Payer: Medicare Other | Admitting: Occupational Therapy

## 2015-08-19 ENCOUNTER — Encounter: Payer: Self-pay | Admitting: Neurology

## 2015-08-19 VITALS — BP 121/61 | HR 55 | Ht 60.0 in | Wt 105.0 lb

## 2015-08-19 DIAGNOSIS — R4189 Other symptoms and signs involving cognitive functions and awareness: Secondary | ICD-10-CM | POA: Insufficient documentation

## 2015-08-19 DIAGNOSIS — M6289 Other specified disorders of muscle: Secondary | ICD-10-CM | POA: Insufficient documentation

## 2015-08-19 DIAGNOSIS — R531 Weakness: Secondary | ICD-10-CM | POA: Insufficient documentation

## 2015-08-19 DIAGNOSIS — R269 Unspecified abnormalities of gait and mobility: Secondary | ICD-10-CM | POA: Diagnosis not present

## 2015-08-19 DIAGNOSIS — I69898 Other sequelae of other cerebrovascular disease: Secondary | ICD-10-CM | POA: Insufficient documentation

## 2015-08-19 DIAGNOSIS — R201 Hypoesthesia of skin: Secondary | ICD-10-CM

## 2015-08-19 DIAGNOSIS — R2681 Unsteadiness on feet: Secondary | ICD-10-CM | POA: Insufficient documentation

## 2015-08-19 DIAGNOSIS — R279 Unspecified lack of coordination: Secondary | ICD-10-CM | POA: Diagnosis not present

## 2015-08-19 DIAGNOSIS — R2689 Other abnormalities of gait and mobility: Secondary | ICD-10-CM

## 2015-08-19 DIAGNOSIS — R293 Abnormal posture: Secondary | ICD-10-CM | POA: Insufficient documentation

## 2015-08-19 DIAGNOSIS — IMO0002 Reserved for concepts with insufficient information to code with codable children: Secondary | ICD-10-CM

## 2015-08-19 DIAGNOSIS — Z7409 Other reduced mobility: Secondary | ICD-10-CM | POA: Insufficient documentation

## 2015-08-19 DIAGNOSIS — I69998 Other sequelae following unspecified cerebrovascular disease: Secondary | ICD-10-CM | POA: Diagnosis not present

## 2015-08-19 DIAGNOSIS — I6980 Unspecified sequelae of other cerebrovascular disease: Secondary | ICD-10-CM | POA: Diagnosis not present

## 2015-08-19 DIAGNOSIS — H539 Unspecified visual disturbance: Secondary | ICD-10-CM | POA: Diagnosis not present

## 2015-08-19 DIAGNOSIS — R29818 Other symptoms and signs involving the nervous system: Secondary | ICD-10-CM | POA: Insufficient documentation

## 2015-08-19 DIAGNOSIS — R29898 Other symptoms and signs involving the musculoskeletal system: Secondary | ICD-10-CM

## 2015-08-19 DIAGNOSIS — R6889 Other general symptoms and signs: Secondary | ICD-10-CM | POA: Insufficient documentation

## 2015-08-19 DIAGNOSIS — I69398 Other sequelae of cerebral infarction: Secondary | ICD-10-CM

## 2015-08-19 NOTE — Therapy (Signed)
[facility] [address] [place], [place], [address] Phone: [phone]   Fax:  [phone]  Occupational Therapy Treatment  Patient Details  Name: Leah Figueroa MRN: [id number] Date of Birth: 1923-03-27 Referring Provider: Dr. Alysia Penna  Encounter Date: 08/19/2015      OT End of Session - 08/19/15 1243    Visit Number 7   Number of Visits 17   Date for OT Re-Evaluation 09/20/15   Authorization Type MCR - G code needed   Authorization - Visit Number 7   Authorization - Number of Visits 10   OT Start Time 1151   OT Stop Time 1230   OT Time Calculation (min) 39 min   Activity Tolerance Patient tolerated treatment well   Behavior During Therapy [facility] for tasks assessed/performed      Past Medical History  Diagnosis Date  . Other specified personal history presenting hazards to health(V15.89)   . Personal history of arthritis   . Unspecified personal history presenting hazards to health   . Cramp of limb   . Acute myocardial infarction of other anterior wall, episode of care unspecified   . Personal history of malignant neoplasm of breast   . Unspecified essential hypertension   . Personal history of other diseases of digestive system   . Unspecified hypothyroidism   . Other and unspecified hyperlipidemia   . Stroke [facility]) 03/2015    "left sided issues"    Past Surgical History  Procedure Laterality Date  . Right thyroid lobectomy    . Cataract extraction      left-2006, right 2011  . Intrathoracic goiter resection    . Cholecystectomy    . Lumpectomy for breast cancer Left 80's    radiation therapy  . Back surgery      There were no vitals filed for this visit.  Visit Diagnosis:  Weakness of left hand  Lack of coordination due to stroke  Impaired sensation  Impaired cognition  Vision disturbance following cerebrovascular accident      Subjective Assessment - 08/19/15 1223    Subjective   Pt's daughter reports pt requires assit with clothing orientation   Patient is accompained by: Family member   Pertinent History back surgery, breast CA   Limitations HOH   Patient Stated Goals to drop less from Lt hand   Currently in Pain? No/denies       Treatment: therapsit started checking short term goals. Pt met goal for grip strength and 9 hole peg test for LUE. See below. Number cancellation with only 3 items missed (all left of midline) education provided regarding importance of scanning all the way to left side.  Standing to fold clothes with min-mod v.c for technique/ clothing orientation. Standing to place graded clothespins on vertical antennae, min-mod difficulty/ v.c. Required for left hand use.                         OT Short Term Goals - 08/19/15 1153    OT SHORT TERM GOAL #1   Title Indepndent with HEP for Lt hand coordination and strength (all STG's due 08/21/15)    Time 4   Period Weeks   Status On-going  08/12/15:  needs min v.c./prompts   OT SHORT TERM GOAL #2   Title Pt to verbalize understanding with visual strategies for Lt inattention and safety considerations with impaired sensation LUE   Time 4   Period Weeks   Status  On-going   OT SHORT TERM GOAL #3   Title Indpependent with all BADLS   Baseline 11/07/38- pt needs mod A per daughter   Time 4   Period Weeks   Status On-going   OT SHORT TERM GOAL #4   Title Improve grip strength Lt hand to 25 lbs or greater to assist with opening containers   Baseline 08/18/14- 25 lbs   Time 4   Period Weeks   Status Achieved   OT SHORT TERM GOAL #5   Title Improve coordination as evidenced by performing 9 hole peg test in 75 sec. or less Lt hand   Baseline 08/18/14-64.47   Time 4   Period Weeks   Status Achieved           OT Long Term Goals - 08/07/15 1507    OT LONG TERM GOAL #1   Title Independent with updated HEP (ALL LTG's due 09/20/15)   Time 8   Period Weeks   Status On-going    OT LONG TERM GOAL #2   Title Independent with simple cold meal prep and microwaveable items   Baseline dependent   Time 8   Period Weeks   Status On-going   OT LONG TERM GOAL #3   Title Improve coordination as evidenced by performing 9 hole peg test in 60 sec. or under Lt hand   Baseline 93.37 sec   Time 8   Period Weeks   Status On-going   OT LONG TERM GOAL #4   Title Pt to perform enviornmental scanning with 90% accuracy or greater   Time 8   Period Weeks   Status On-going   OT LONG TERM GOAL #5   Title Pt to return to simple financial management and medication management with supervision/cueing prn for accuracy/safety   Time 8   Period Weeks   Status On-going               Plan - 08/19/15 1241    Clinical Impression Statement Pt is progressing towards goals, cognition remains pt's greatest barrier   Pt will benefit from skilled therapeutic intervention in order to improve on the following deficits (Retired) Decreased coordination;Decreased endurance;Decreased safety awareness;Impaired sensation;Decreased knowledge of precautions;Decreased activity tolerance;Decreased balance;Decreased knowledge of use of DME;Impaired UE functional use;Decreased cognition;Decreased mobility;Decreased strength;Impaired perceived functional ability;Impaired vision/preception;Pain   Rehab Potential Good   OT Frequency 2x / week   OT Duration 8 weeks   OT Treatment/Interventions [place]-care/ADL training;Therapeutic exercise;Functional Mobility Training;Patient/family education;Neuromuscular education;Manual Therapy;Energy conservation;Therapeutic exercises;DME and/or AE instruction;Therapeutic activities;Cognitive remediation/compensation;Passive range of motion;Visual/perceptual remediation/compensation;Moist Heat   Plan finish checking STG's, practice donning shirt or jacket   OT Home Exercise Plan coordination and putty HEP 07/28/15.    Consulted and Agree with Plan of Care Patient;Family  member/caregiver   Family Member Consulted daughter        Problem List Patient Active Problem List   Diagnosis Date Noted  . Left-sided neglect 07/01/2015  . History of right MCA stroke 07/01/2015  . Altered sensation due to stroke 04/29/2015  . Cognitive deficit, post-stroke 04/29/2015  . Pseudogout of left wrist 03/28/2015  . Hyperlipidemia LDL goal <70 03/26/2015  . Hyperglycemia 03/26/2015  . Thrombotic stroke involving right middle cerebral artery ([place]) 03/26/2015  . Embolic stroke ([place])   . Left hemiplegia ([place])   . Homonymous hemianopia   . Chest wall trauma 01/11/2014  . Injury of left lower arm 01/11/2014  . Headache(784.0) 01/04/2013  . Allergic rhinitis, cause unspecified 01/04/2013  .  Anemia 04/19/2012  . Epistaxis, recurrent 04/19/2012  . Hypothyroidism 01/01/2008  . Essential hypertension 01/01/2008  . HYPERTENSION 01/01/2008  . MYOCARDIAL INFARCTION, ACUTE, ANTERIOR WALL 01/01/2008  . LEG CRAMPS 01/01/2008  . ADENOCARCINOMA, BREAST, HX OF 01/01/2008  . DIVERTICULOSIS, COLON, HX OF 01/01/2008  . ARTHRITIS, HX OF 01/01/2008  . RADIATION THERAPY, HX OF 01/01/2008  . CATARACT EXTRACTION, HX OF 01/01/2008  . CHOLECYSTECTOMY, LAPAROSCOPIC, HX OF 01/01/2008    RINE,KATHRYN 08/19/2015, 12:44 PM Theone Murdoch, OTR/L [phone] Phone: [phone] 12:45 PM 08/19/2015 [facility] [address] [place], [place], [address] Phone: [phone]   Fax:  [phone]  Name: Leah Figueroa MRN: [id number] Date of Birth: 1923/07/15

## 2015-08-19 NOTE — Therapy (Signed)
[facility] [address] [place], [place], [address] Phone: [phone]   Fax:  [phone]  Physical Therapy Treatment  Patient Details  Name: Leah Figueroa MRN: [id number] Date of Birth: 07/11/1923 Referring Provider: Alysia Penna, MD  Encounter Date: 08/19/2015      PT End of Session - 08/19/15 1236    Visit Number 7   Number of Visits 16   Date for PT Re-Evaluation 08/20/14   Authorization Type Medicare G-Code & progress note every 10 visits   PT Start Time 1231   PT Stop Time 1312   PT Time Calculation (min) 41 min   Equipment Utilized During Treatment Gait belt   Activity Tolerance Patient tolerated treatment well   Behavior During Therapy [facility] for tasks assessed/performed      Past Medical History  Diagnosis Date  . Other specified personal history presenting hazards to health(V15.89)   . Personal history of arthritis   . Unspecified personal history presenting hazards to health   . Cramp of limb   . Acute myocardial infarction of other anterior wall, episode of care unspecified   . Personal history of malignant neoplasm of breast   . Unspecified essential hypertension   . Personal history of other diseases of digestive system   . Unspecified hypothyroidism   . Other and unspecified hyperlipidemia   . Stroke [facility]) 03/2015    "left sided issues"    Past Surgical History  Procedure Laterality Date  . Right thyroid lobectomy    . Cataract extraction      left-2006, right 2011  . Intrathoracic goiter resection    . Cholecystectomy    . Lumpectomy for breast cancer Left 80's    radiation therapy  . Back surgery      There were no vitals filed for this visit.  Visit Diagnosis:  Abnormality of gait  Weakness due to cerebrovascular accident  Unsteadiness  Balance problems  Decreased functional activity tolerance  Posture abnormality  Decreased functional mobility and endurance       Subjective Assessment - 08/19/15 1234    Subjective No new complaints. No falls or pain.   Currently in Pain? No/denies   Pain Score 0-No pain            [facility] Adult PT Treatment/Exercise - 08/19/15 1237    Ambulation/Gait   Ambulation/Gait Yes   Ambulation/Gait Assistance 5: Supervision;6: Modified independent (Device/Increase time)   Ambulation/Gait Assistance Details weaving around furniture and obstacles and 180 degree turns included.   Ambulation Distance (Feet) 455 Feet   Assistive device Rollator   Gait Pattern Step-through pattern;Decreased step length - left;Decreased stride length;Decreased dorsiflexion - left;Trunk flexed;Narrow base of support   Ambulation Surface Level;Indoor   Timed Up and Go Test   TUG Normal TUG   Normal TUG (seconds) 15.38  no device          Balance Exercises - 08/19/15 1250    OTAGO PROGRAM   Head Movements Sitting;5 reps   Neck Movements Sitting;5 reps   Back Extension Standing;5 reps   Trunk Movements Standing;5 reps   Ankle Movements Sitting;10 reps   Knee Extensor 10 reps   Knee Flexor 10 reps   Hip ABductor 10 reps   Ankle Plantorflexors 20 reps, support   Ankle Dorsiflexors 20 reps, support   Knee Bends 10 reps, support   Backwards Walking Support  counter   Walking and Turning Around Assistive device  intermittent counter support   Sideways  Walking Assistive device  counter   Tandem Stance 10 seconds, support   Tandem Walk Support   One Leg Stand 10 seconds, support   Heel Walking Support   Toe Walk Support   Sit to Stand 10 reps, no support             PT Short Term Goals - 08/19/15 1236    PT SHORT TERM GOAL #1   Title Patient demonstrates understanding of inital HEP. (Target Date: 08/20/2014)   Baseline 08/19/15: pt returns demo with visual cues, some verbal cues of OTAGO program. Per pt/daughter report she is not complaint with performing it at home.   Status Achieved   PT SHORT TERM GOAL #2   Title Timed  Up & Go without device with supervision <18sec. (Target Date: 08/20/2014)   Baseline 08/19/15: 15.38 sec's without AD with supervision.   Status Achieved   PT SHORT TERM GOAL #3   Title Paitent ambulates 300' with rollator walker with supervision. (Target Date: 08/20/2014)   Baseline 08/19/15: met today.   Period Months   Status Achieved           PT Long Term Goals - 07/22/15 1145    PT LONG TERM GOAL #1   Title Berg Balance >45/56 to indicate lower fall risk (Target Date: 09/18/2014)   Time 2   Period Months   Status New   PT LONG TERM GOAL #2   Title Timed Up & Go without device safely <15 sec. (Target Date: 09/18/2014)   Time 2   Period Months   Status New   PT LONG TERM GOAL #3   Title Patient ambulates 400' with rollator walker with supervision for community mobility. (Target Date: 09/18/2014)   Time 2   Period Months   Status New   PT LONG TERM GOAL #4   Title Patient ambulates 100' around furniture with LRAD modified independent. (Target Date: 09/18/2014)   Time 2   Status New   PT LONG TERM GOAL #5   Title Patient negotiates ramps, curbs with rollator walker and stairs with 1 rail with supervision. (Target Date: 09/18/2014)   Time 2   Period Months   Status New            Plan - 08/19/15 1236    Clinical Impression Statement Pt has met all STG's and is progressing toward her LTG's. Reinforced complaince with HEP for optimal gains/improvement with PT. Pt and daughter verbalized understanding.    Pt will benefit from skilled therapeutic intervention in order to improve on the following deficits Abnormal gait;Decreased activity tolerance;Decreased balance;Decreased endurance;Decreased mobility;Decreased strength;Postural dysfunction;Decreased safety awareness   Rehab Potential Good   PT Frequency 2x / week   PT Duration 8 weeks   PT Treatment/Interventions ADLs/[facility];DME Instruction;Gait training;Stair training;Functional mobility training;Therapeutic  activities;Therapeutic exercise;Balance training;Neuromuscular re-education;Patient/family education   PT Next Visit Plan  Continue toward LTG's.   Consulted and Agree with Plan of Care Patient;Family member/caregiver   Family Member Consulted pt's dtr        Problem List Patient Active Problem List   Diagnosis Date Noted  . Left-sided neglect 07/01/2015  . History of right MCA stroke 07/01/2015  . Altered sensation due to stroke 04/29/2015  . Cognitive deficit, post-stroke 04/29/2015  . Pseudogout of left wrist 03/28/2015  . Hyperlipidemia LDL goal <70 03/26/2015  . Hyperglycemia 03/26/2015  . 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 Embolic stroke ([place])   . Left  hemiplegia ([place])   . Homonymous hemianopia   . Chest wall trauma 01/11/2014  . Injury of left lower arm 01/11/2014  . Headache(784.0) 01/04/2013  . Allergic rhinitis, cause unspecified 01/04/2013  . Anemia 04/19/2012  . Epistaxis, recurrent 04/19/2012  . Hypothyroidism 01/01/2008  . Essential hypertension 01/01/2008  . HYPERTENSION 01/01/2008  . MYOCARDIAL INFARCTION, ACUTE, ANTERIOR WALL 01/01/2008  . LEG CRAMPS 01/01/2008  . ADENOCARCINOMA, BREAST, HX OF 01/01/2008  . DIVERTICULOSIS, COLON, HX OF 01/01/2008  . ARTHRITIS, HX OF 01/01/2008  . RADIATION THERAPY, HX OF 01/01/2008  . CATARACT EXTRACTION, HX OF 01/01/2008  . CHOLECYSTECTOMY, LAPAROSCOPIC, HX OF 01/01/2008    Willow Ora 08/20/2015, 11:23 AM  Willow Ora, PTA, [facility] [address], [place], [place] [address] [phone] 08/20/2015, 11:23 AM   Name: Leah Figueroa MRN: [id number] Date of Birth: 06-19-1923

## 2015-08-19 NOTE — Patient Instructions (Signed)
I had a long d/w patient and daughter about her recent strokes, risk for recurrent stroke/TIAs, personally independently reviewed imaging studies and stroke evaluation results and answered questions.the patient's infarcts are probably embolic from undocumented atrial fibrillation but she is clearly not a long-term anticoagulation candidate given fall risk and advanced age Continue Plavix  for secondary stroke prevention and maintain strict control of hypertension with blood pressure goal below 130/90, diabetes with hemoglobin A1c goal below 6.5% and lipids with LDL cholesterol goal below 70 mg/dL. I also advised the patient and her daughter about fall risk precautions and recommend that she use a walker at all times.. Followup in the future with me in  6 months or call earlier if necessary. Fall Prevention in the Home  Falls can cause injuries and can affect people from all age groups. There are many simple things that you can do to make your home safe and to help prevent falls. WHAT CAN I DO ON THE OUTSIDE OF MY HOME?  Regularly repair the edges of walkways and driveways and fix any cracks.  Remove high doorway thresholds.  Trim any shrubbery on the main path into your home.  Use bright outdoor lighting.  Clear walkways of debris and clutter, including tools and rocks.  Regularly check that handrails are securely fastened and in good repair. Both sides of any steps should have handrails.  Install guardrails along the edges of any raised decks or porches.  Have leaves, snow, and ice cleared regularly.  Use sand or salt on walkways during winter months.  In the garage, clean up any spills right away, including grease or oil spills. WHAT CAN I DO IN THE BATHROOM?  Use night lights.  Install grab bars by the toilet and in the tub and shower. Do not use towel bars as grab bars.  Use non-skid mats or decals on the floor of the tub or shower.  If you need to sit down while you are in the  shower, use a plastic, non-slip stool.Marland Kitchen.  Keep the floor dry. Immediately clean up any water that spills on the floor.  Remove soap buildup in the tub or shower on a regular basis.  Attach bath mats securely with double-sided non-slip rug tape.  Remove throw rugs and other tripping hazards from the floor. WHAT CAN I DO IN THE BEDROOM?  Use night lights.  Make sure that a bedside light is easy to reach.  Do not use oversized bedding that drapes onto the floor.  Have a firm chair that has side arms to use for getting dressed.  Remove throw rugs and other tripping hazards from the floor. WHAT CAN I DO IN THE KITCHEN?   Clean up any spills right away.  Avoid walking on wet floors.  Place frequently used items in easy-to-reach places.  If you need to reach for something above you, use a sturdy step stool that has a grab bar.  Keep electrical cables out of the way.  Do not use floor polish or wax that makes floors slippery. If you have to use wax, make sure that it is non-skid floor wax.  Remove throw rugs and other tripping hazards from the floor. WHAT CAN I DO IN THE STAIRWAYS?  Do not leave any items on the stairs.  Make sure that there are handrails on both sides of the stairs. Fix handrails that are broken or loose. Make sure that handrails are as long as the stairways.  Check any carpeting to make sure  that it is firmly attached to the stairs. Fix any carpet that is loose or worn.  Avoid having throw rugs at the top or bottom of stairways, or secure the rugs with carpet tape to prevent them from moving.  Make sure that you have a light switch at the top of the stairs and the bottom of the stairs. If you do not have them, have them installed. WHAT ARE SOME OTHER FALL PREVENTION TIPS?  Wear closed-toe shoes that fit well and support your feet. Wear shoes that have rubber soles or low heels.  When you use a stepladder, make sure that it is completely opened and that the  sides are firmly locked. Have someone hold the ladder while you are using it. Do not climb a closed stepladder.  Add color or contrast paint or tape to grab bars and handrails in your home. Place contrasting color strips on the first and last steps.  Use mobility aids as needed, such as canes, walkers, scooters, and crutches.  Turn on lights if it is dark. Replace any light bulbs that burn out.  Set up furniture so that there are clear paths. Keep the furniture in the same spot.  Fix any uneven floor surfaces.  Choose a carpet design that does not hide the edge of steps of a stairway.  Be aware of any and all pets.  Review your medicines with your healthcare provider. Some medicines can cause dizziness or changes in blood pressure, which increase your risk of falling. Talk with your health care provider about other ways that you can decrease your risk of falls. This may include working with a physical therapist or trainer to improve your strength, balance, and endurance.   This information is not intended to replace advice given to you by your health care provider. Make sure you discuss any questions you have with your health care provider.   Document Released: 07/23/2002 Document Revised: 12/17/2014 Document Reviewed: 09/06/2014 Elsevier Interactive Patient Education [employer]

## 2015-08-19 NOTE — Progress Notes (Signed)
[facility]  PATIENT: Leah Figueroa DOB: 1923/01/06  REFERRING CLINICIAN: S Biby / stroke follow up  HISTORY FROM: patient, daughter, PT from [facility] REASON FOR VISIT: follow up   HISTORICAL  CHIEF COMPLAINT:  Chief Complaint  Patient presents with  . Follow-up    HISTORY OF PRESENT ILLNESS:   82 year old female here for evaluation and follow-up from stroke in August 2016. Patient had onset of leaning to the left side, falling to the left, taken to the hospital via EMS code stroke was activated. Patient was evaluated and treated with IV TPA. She was admitted to the hospital for further evaluation. Patient was found to have right MCA ischemic infarction with right pontine ischemic infarction. Carotid Doppler showed no significant stenosis. Echocardiogram showed no cardioembolic source of emboli. LDL was 94 and hemoglobin X5MA1c was 5.8. Patient was on aspirin 81 mg daily and this was changed to Plavix. Patient went to inpatient rehabilitation, [facility]. Patient planning to be discharged within next week or 2.  Since discharge patient is doing well. No new symptoms, headaches, pain or other problems. She has made slow steady improvement over the last month.   REVIEW OF SYSTEMS: Full 14 system review of systems performed and notable only for only as per history of present illness. Otherwise negative.  ALLERGIES: No Known Allergies  HOME MEDICATIONS: Outpatient Prescriptions Prior to Visit  Medication Sig Dispense Refill  . acetaminophen (TYLENOL) 325 MG tablet Take 2 tablets (650 mg total) by mouth every 4 (four) hours as needed for mild pain.    Marland Kitchen. atorvastatin (LIPITOR) 10 MG tablet Take 10 mg by mouth daily.    . cholecalciferol (VITAMIN D) 400 UNITS TABS tablet Take 400 Units by mouth daily.    . clopidogrel (PLAVIX) 75 MG tablet Take 1 tablet (75 mg total) by mouth daily.    Marland Kitchen. levothyroxine (SYNTHROID,  LEVOTHROID) 25 MCG tablet Take 1 tablet by mouth  daily 90 tablet 0  . metoprolol (LOPRESSOR) 50 MG tablet Take 1 tablet by mouth two  times daily 180 tablet 0  . vitamin B-12 (CYANOCOBALAMIN) 1000 MCG tablet Take 1,000 mcg by mouth 2 (two) times daily.     . vitamin B-12 (CYANOCOBALAMIN) 1000 MCG tablet Take 1,000 mcg by mouth daily.    Marland Kitchen. amLODipine (NORVASC) 2.5 MG tablet Take 5 mg by mouth daily. Reported on 08/19/2015    . diclofenac sodium (VOLTAREN) 1 % GEL Apply 2 g topically 3 (three) times daily. To left wrist (Patient not taking: Reported on 08/19/2015)    . docusate sodium (COLACE) 100 MG capsule Take 100 mg by mouth daily. Reported on 08/19/2015    . ferrous sulfate 325 (65 FE) MG tablet Take 325 mg by mouth daily with breakfast. Reported on 08/19/2015    . ketotifen (ZADITOR) 0.025 % ophthalmic solution Place 1 drop into both eyes 2 (two) times daily. (Patient not taking: Reported on 08/19/2015) 5 mL 0  . polyethylene glycol (MIRALAX / GLYCOLAX) packet Take 17 g by mouth every evening. Reported on 08/19/2015     No facility-administered medications prior to visit.    PAST MEDICAL HISTORY: Past Medical History  Diagnosis Date  . Other specified personal history presenting hazards to health(V15.89)   . Personal history of arthritis   . Unspecified personal history presenting hazards to health   . Cramp of limb   . Acute myocardial infarction of other anterior wall, episode of care unspecified   .  Personal history of malignant neoplasm of breast   . Unspecified essential hypertension   . Personal history of other diseases of digestive system   . Unspecified hypothyroidism   . Other and unspecified hyperlipidemia   . Stroke [facility]) 03/2015    "left sided issues"    PAST SURGICAL HISTORY: Past Surgical History  Procedure Laterality Date  . Right thyroid lobectomy    . Cataract extraction      left-2006, right 2011  . Intrathoracic goiter resection    . Cholecystectomy    . Lumpectomy  for breast cancer Left 80's    radiation therapy  . Back surgery      FAMILY HISTORY: Family History  Problem Relation Age of Onset  . Colon cancer Mother   . Coronary artery disease Brother   . Breast cancer Mother     SOCIAL HISTORY:  Social History   Social History  . Marital Status: Widowed    Spouse Name: N/A  . Number of Children: 1  . Years of Education: 11   Occupational History  . retired    Social History Main Topics  . Smoking status: Never Smoker   . Smokeless tobacco: Never Used  . Alcohol Use: No  . Drug Use: No  . Sexual Activity: No   Other Topics Concern  . Not on file   Social History Narrative   HSG. Married '48- 57 yrs/widowed. 1 daughter - '59; 1 grandchild. 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  Lives alone. I-ADLs, manages books, pays bills.. End-of-Life Care: no heroic measures; No CPR, no mechanical ventilation.  Caffeine use/day:  None. Does Patient Exercise:  No       04/29/15 currently resident at [facility] re[facility]        PHYSICAL EXAM  GENERAL EXAM/CONSTITUTIONAL: Vitals:  Filed Vitals:   08/19/15 1034  BP: 121/61  Pulse: 55  Height: 5' (1.524 m)  Weight: 105 lb (47.628 kg)   Body mass index is 20.51 kg/(m^2). No exam data present   Frail elderly Caucasian lady. Mild kyphosis.. Afebrile. Head is nontraumatic. Neck is supple without bruit.    Cardiac exam no murmur or gallop. Lungs are clear to auscultation. Distal pulses are well felt.  MENTAL STATUS:  Awake alert oriented 2. Diminished attention, registration and recall. Follows simple one and few two-step commands. Speech is clear without aphasia or apraxia dysarthria. Extraocular movements are full range without nystagmus. Fundi were not visualized. Vision acuity and fields seem adequate. Face is symmetric without weakness. Tongue is midline. Motor system exam reveals no upper or lower extremity drift. Symmetric and equal strength. Deep tendon reflexes are 1+ depressed but  present. Plantars are downgoing. Coordination is slow but accurate. Gait slightly broad-based and unsteady. Uses a wheeled walker.   04/29/2015    5   5   3    0   2   2   0   3   1   1    0   22       DIAGNOSTIC DATA (LABS, IMAGING, TESTING) - I reviewed patient records, labs, notes, testing and imaging myself where available.  Lab Results  Component Value Date   WBC 5.8 07/18/2015   HGB 12.5 07/18/2015   HCT 39.9 07/18/2015   MCV 89.9 07/18/2015   PLT 219 07/18/2015      Component Value Date/Time   NA 142 07/18/2015 1102   K 4.1 07/18/2015 1102   CL 106 07/18/2015 1102   CO2 28 07/18/2015  1102   GLUCOSE 102* 07/18/2015 1102   BUN 11 07/18/2015 1102   CREATININE 0.61 07/18/2015 1102   CALCIUM 9.6 07/18/2015 1102   PROT 6.7 03/27/2015 0626   ALBUMIN 3.0* 03/27/2015 0626   AST 22 03/27/2015 0626   ALT 12* 03/27/2015 0626   ALKPHOS 54 03/27/2015 0626   BILITOT 0.9 03/27/2015 0626   GFRNONAA >60 07/18/2015 1102   GFRAA >60 07/18/2015 1102   Lab Results  Component Value Date   CHOL 152 03/23/2015   HDL 47 03/23/2015   LDLCALC 94 03/23/2015   TRIG 54 03/23/2015   CHOLHDL 3.2 03/23/2015   Lab Results  Component Value Date   HGBA1C 5.8* 03/23/2015   No results found for: VITAMINB12 Lab Results  Component Value Date   TSH 1.29 07/24/2013    03/23/15 MRI brain  - Acute RIGHT frontoparietal lobe infarct, MCA territory. Subcentimeter acute RIGHT pontine infarct. - Severe white matter changes compatible with chronic small vessel ischemic disease. Scattered susceptibility artifacts suggest sequelae of chronic hypertension without lobar hematoma.   03/23/15 MRA head  - No acute large vessel occlusion.  - Dolicoectatic intracranial vessels compatible chronic hypertension with superimposed moderate luminal irregularity commonly seen with atherosclerosis. - Focal high-grade stenosis LEFT A2 segment.  03/24/15 carotid u/s  - No significant extracranial carotid artery  stenosis demonstrated 1-39%.  - Vertebrals are patent with antegrade flow.  03/24/15 TTE  - Left ventricle: The cavity size was normal. There was moderateconcentric hypertrophy. Systolic function was normal. Theestimated ejection fraction was in the range of 60% to 65%. Thereis akinesis of the apical myocardium. Doppler parameters areconsistent with abnormal left ventricular relaxation (grade 1diastolic dysfunction). - Mitral valve: Moderately calcified annulus. - No cardiac source of emboli was identified.     ASSESSMENT AND PLAN  82 y.o. year old female here with right MCA and right pontine ischemic infarctions in early August 2016, status post IV TPA. Patient is doing well with mild gradual improvement in symptoms. Suspect embolic etiology of strokes. Due to advanced age, fall risk further cardioembolic workup was deferred as she is not a good long term anticoagulation candidate. Agree with medical management with Plavix, statin, blood pressure control.     PLAN:   I had a long d/w patient and daughter about her recent strokes, risk for recurrent stroke/TIAs, personally independently reviewed imaging studies and stroke evaluation results and answered questions.the patient's infarcts are probably embolic from undocumented atrial fibrillation but she is clearly not a long-term anticoagulation candidate given fall risk and advanced age Continue Plavix  for secondary stroke prevention and maintain strict control of hypertension with blood pressure goal below 130/90, diabetes with hemoglobin A1c goal below 6.5% and lipids with LDL cholesterol goal below 70 mg/dL. I also advised the patient and her daughter about fall risk precautions and recommend that she use a walker at all times.. Greater than 50% time during this 25 minute visit was spent on counseling and coordination of care. Followup in the future with me in  6 months or call earlier if necessary.  Return in about 6 months (around  02/16/2016).    Delia Heady MD 08/19/2015, 12:33 PM Certified in Neurology, Vascular Neurology and Neuroimaging [facility] [address], [address] [place], [place] [address] [phone]

## 2015-08-21 ENCOUNTER — Ambulatory Visit: Payer: Medicare Other | Admitting: Occupational Therapy

## 2015-08-21 ENCOUNTER — Ambulatory Visit: Payer: Medicare Other | Admitting: Physical Therapy

## 2015-08-21 ENCOUNTER — Encounter: Payer: Self-pay | Admitting: Physical Therapy

## 2015-08-21 DIAGNOSIS — R2689 Other abnormalities of gait and mobility: Secondary | ICD-10-CM

## 2015-08-21 DIAGNOSIS — R29818 Other symptoms and signs involving the nervous system: Secondary | ICD-10-CM | POA: Diagnosis not present

## 2015-08-21 DIAGNOSIS — IMO0002 Reserved for concepts with insufficient information to code with codable children: Secondary | ICD-10-CM

## 2015-08-21 DIAGNOSIS — I69398 Other sequelae of cerebral infarction: Secondary | ICD-10-CM

## 2015-08-21 DIAGNOSIS — H539 Unspecified visual disturbance: Secondary | ICD-10-CM

## 2015-08-21 DIAGNOSIS — R531 Weakness: Secondary | ICD-10-CM | POA: Diagnosis not present

## 2015-08-21 DIAGNOSIS — R269 Unspecified abnormalities of gait and mobility: Secondary | ICD-10-CM | POA: Diagnosis not present

## 2015-08-21 DIAGNOSIS — R29898 Other symptoms and signs involving the musculoskeletal system: Secondary | ICD-10-CM

## 2015-08-21 DIAGNOSIS — R293 Abnormal posture: Secondary | ICD-10-CM

## 2015-08-21 DIAGNOSIS — R201 Hypoesthesia of skin: Secondary | ICD-10-CM

## 2015-08-21 DIAGNOSIS — R2681 Unsteadiness on feet: Secondary | ICD-10-CM | POA: Diagnosis not present

## 2015-08-21 DIAGNOSIS — R6889 Other general symptoms and signs: Secondary | ICD-10-CM

## 2015-08-21 DIAGNOSIS — I69898 Other sequelae of other cerebrovascular disease: Secondary | ICD-10-CM | POA: Diagnosis not present

## 2015-08-21 DIAGNOSIS — R4189 Other symptoms and signs involving cognitive functions and awareness: Secondary | ICD-10-CM

## 2015-08-21 NOTE — Therapy (Signed)
[facility] [address] [place], [place], [address] Phone: [phone]   Fax:  [phone]  Occupational Therapy Treatment  Patient Details  Name: Leah Figueroa MRN: [id number] Date of Birth: 03-05-1923 Referring Provider: Dr. Alysia Penna  Encounter Date: 08/21/2015      OT End of Session - 08/21/15 1700    Visit Number 8   Number of Visits 17   Date for OT Re-Evaluation 09/20/15   Authorization Type MCR - G code needed   Authorization - Visit Number 8   Authorization - Number of Visits 10   OT Start Time 1400   OT Stop Time 1445   OT Time Calculation (min) 45 min   Activity Tolerance Patient tolerated treatment well      Past Medical History  Diagnosis Date  . Other specified personal history presenting hazards to health(V15.89)   . Personal history of arthritis   . Unspecified personal history presenting hazards to health   . Cramp of limb   . Acute myocardial infarction of other anterior wall, episode of care unspecified   . Personal history of malignant neoplasm of breast   . Unspecified essential hypertension   . Personal history of other diseases of digestive system   . Unspecified hypothyroidism   . Other and unspecified hyperlipidemia   . Stroke [facility]) 03/2015    "left sided issues"    Past Surgical History  Procedure Laterality Date  . Right thyroid lobectomy    . Cataract extraction      left-2006, right 2011  . Intrathoracic goiter resection    . Cholecystectomy    . Lumpectomy for breast cancer Left 80's    radiation therapy  . Back surgery      There were no vitals filed for this visit.  Visit Diagnosis:  Impaired sensation  Impaired cognition  Lack of coordination due to stroke  Weakness of left hand  Vision disturbance following cerebrovascular accident      Subjective Assessment - 08/21/15 1444    Subjective  Daughter reports complete assist with donning/doffing shirt  and max assist with bathing   Patient is accompained by: Family member   Pertinent History back surgery, breast CA   Limitations HOH   Patient Stated Goals to drop less from Lt hand   Currently in Pain? No/denies                      OT Treatments/Exercises (OP) - 08/21/15 0001    ADLs   UB Dressing Pt doffed and donned shirt first trial with mod v.c's and min assist for orientation, with extra time required. 2nd trial: pt doffed and donned shirt with only min to mod v.c's, no assist required. Pt/daughter instructed to keep same method for daily routine at home. Pt donned coat with set up only   Bathing Discussed strategies for increased independence with bathing while seated in shower seat. Recommended continued supervision for safety and assist to wash back and to stand to wash bottom, but to allow pt to do all other bathing seated with supervision.    ADL Comments Assessed remaining STG's and progress to date (see goal section). Reviewed visual strategies, home activities,  and safety tips for decreased sensation and awareness with pt/daughter. Also reviewed coordination and putty HEP with patient/daughter. Pt return demo of each with mod cues. Pt's daughter reports she is only doing 1 putty exercise.  OT Short Term Goals - 08/21/15 1700    OT SHORT TERM GOAL #1   Title Indepndent with HEP for Lt hand coordination and strength (all STG's due 08/21/15)    Time 4   Period Weeks   Status Partially Met  08/21/15: Cueing required to perform correctly   OT SHORT TERM GOAL #2   Title Pt to verbalize understanding with visual strategies for Lt inattention and safety considerations with impaired sensation LUE   Time 4   Period Weeks   Status Partially Met  issued and educated pt/daughter, but pt requires cues to implement   OT SHORT TERM GOAL #3   Title Indpependent with all BADLS   Baseline 10/12/31- pt needs mod A per daughter   Time 4   Period Weeks    Status Not Met  daughter reports max assist for UE dressing, and min assist occasionally for donning pants; max assist for bathing. (However, in clinic pt demo ability to don/doff shirt with min to mod cueing and little to no assist)   OT SHORT TERM GOAL #4   Title Improve grip strength Lt hand to 25 lbs or greater to assist with opening containers   Baseline 08/18/14- 25 lbs   Time 4   Period Weeks   Status Achieved   OT SHORT TERM GOAL #5   Title Improve coordination as evidenced by performing 9 hole peg test in 75 sec. or less Lt hand   Baseline 08/18/14-64.47   Time 4   Period Weeks   Status Achieved           OT Long Term Goals - 08/07/15 1507    OT LONG TERM GOAL #1   Title Independent with updated HEP (ALL LTG's due 09/20/15)   Time 8   Period Weeks   Status On-going   OT LONG TERM GOAL #2   Title Independent with simple cold meal prep and microwaveable items   Baseline dependent   Time 8   Period Weeks   Status On-going   OT LONG TERM GOAL #3   Title Improve coordination as evidenced by performing 9 hole peg test in 60 sec. or under Lt hand   Baseline 93.37 sec   Time 8   Period Weeks   Status On-going   OT LONG TERM GOAL #4   Title Pt to perform enviornmental scanning with 90% accuracy or greater   Time 8   Period Weeks   Status On-going   OT LONG TERM GOAL #5   Title Pt to return to simple financial management and medication management with supervision/cueing prn for accuracy/safety   Time 8   Period Weeks   Status On-going               Plan - 08/21/15 1703    Clinical Impression Statement Pt partially met STG's #1 and #2 but unable to fully meet due to cognitive deficits. Pt also could be more physically independent with BADLS but requires cues and supervision for safety d/t cognitive deficits.    Plan assess how bathing is going with recommendations given this session, practice donning/doffing shirt again prn, continue coordination Lt hand   OT  Home Exercise Plan coordination and putty HEP 07/28/15.    Consulted and Agree with Plan of Care Patient;Family member/caregiver   Family Member Consulted daughter        Problem List Patient Active Problem List   Diagnosis Date Noted  . Left-sided neglect 07/01/2015  . History of  right MCA stroke 07/01/2015  . Altered sensation due to stroke 04/29/2015  . Cognitive deficit, post-stroke 04/29/2015  . Pseudogout of left wrist 03/28/2015  . Hyperlipidemia LDL goal <70 03/26/2015  . Hyperglycemia 03/26/2015  . 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 Embolic stroke ([place])   . Left hemiplegia ([place])   . Homonymous hemianopia   . Chest wall trauma 01/11/2014  . Injury of left lower arm 01/11/2014  . Headache(784.0) 01/04/2013  . Allergic rhinitis, cause unspecified 01/04/2013  . Anemia 04/19/2012  . Epistaxis, recurrent 04/19/2012  . Hypothyroidism 01/01/2008  . Essential hypertension 01/01/2008  . HYPERTENSION 01/01/2008  . MYOCARDIAL INFARCTION, ACUTE, ANTERIOR WALL 01/01/2008  . LEG CRAMPS 01/01/2008  . ADENOCARCINOMA, BREAST, HX OF 01/01/2008  . DIVERTICULOSIS, COLON, HX OF 01/01/2008  . ARTHRITIS, HX OF 01/01/2008  . RADIATION THERAPY, HX OF 01/01/2008  . CATARACT EXTRACTION, HX OF 01/01/2008  . CHOLECYSTECTOMY, LAPAROSCOPIC, HX OF 01/01/2008    Carey Bullocks, OTR/L 08/21/2015, 5:06 PM  [facility] [address] [place], [place], [address] Phone: [phone]   Fax:  [phone]  Name: Leah Figueroa MRN: [id number] Date of Birth: 1922-11-11

## 2015-08-21 NOTE — Therapy (Signed)
[facility] [address] [place], [place], [address] Phone: [phone]   Fax:  [phone]  Physical Therapy Treatment  Patient Details  Name: Leah Figueroa MRN: [id number] Date of Birth: 1923-07-18 Referring Provider: Alysia Penna, MD  Encounter Date: 08/21/2015      PT End of Session - 08/21/15 1315    Visit Number 8   Number of Visits 16   Date for PT Re-Evaluation 09/19/15   Authorization Type Medicare G-Code & progress note every 10 visits   PT Start Time 1230   PT Stop Time 1315   PT Time Calculation (min) 45 min   Equipment Utilized During Treatment Gait belt   Activity Tolerance Patient tolerated treatment well   Behavior During Therapy [facility] for tasks assessed/performed      Past Medical History  Diagnosis Date  . Other specified personal history presenting hazards to health(V15.89)   . Personal history of arthritis   . Unspecified personal history presenting hazards to health   . Cramp of limb   . Acute myocardial infarction of other anterior wall, episode of care unspecified   . Personal history of malignant neoplasm of breast   . Unspecified essential hypertension   . Personal history of other diseases of digestive system   . Unspecified hypothyroidism   . Other and unspecified hyperlipidemia   . Stroke [facility]) 03/2015    "left sided issues"    Past Surgical History  Procedure Laterality Date  . Right thyroid lobectomy    . Cataract extraction      left-2006, right 2011  . Intrathoracic goiter resection    . Cholecystectomy    . Lumpectomy for breast cancer Left 80's    radiation therapy  . Back surgery      There were no vitals filed for this visit.  Visit Diagnosis:  Abnormality of gait  Weakness due to cerebrovascular accident  Unsteadiness  Balance problems  Decreased functional activity tolerance  Posture abnormality      Subjective Assessment - 08/21/15 1321    Subjective (p) No falls. Her exercises are going okay.   Currently in Pain? (p) No/denies                         [facility] Adult PT Treatment/Exercise - 08/21/15 1230    Ambulation/Gait   Ambulation/Gait Yes   Ambulation/Gait Assistance 5: Supervision   Ambulation/Gait Assistance Details cues on walker control, posture and step length   Ambulation Distance (Feet) 500 Feet  500' with rollator & 300' with RW   Assistive device Rollator;Rolling walker   Gait Pattern Step-through pattern;Decreased step length - left;Decreased stride length;Decreased dorsiflexion - left;Trunk flexed;Narrow base of support   Ambulation Surface Indoor;Level   Ramp 4: Min assist  rollator walker   Ramp Details (indicate cue type and reason) verbal cues on brake control & safety technique with rollator   Curb 4: Min assist  rollator walker   Curb Details (indicate cue type and reason) verbal cues on rollator management & safety issues           Therapeutic Exercise:     PT Education - 08/21/15 1315    Education provided Yes   Education Details PT reviewed [employer] with patient performing and PT instructing which exercises are safe for her to perform indepedently in front of a chair & which exercised should vbe supervised by daughter for safety. Both verbalized understanding.   Person(s) Educated  Patient;Child(ren)   Methods Explanation;Demonstration;Verbal cues   Comprehension Verbalized understanding;Returned demonstration          PT Short Term Goals - 08/19/15 1236    PT SHORT TERM GOAL #1   Title Patient demonstrates understanding of inital HEP. (Target Date: 08/20/2014)   Baseline 08/19/15: pt returns demo with visual cues, some verbal cues of OTAGO program. Per pt/daughter report she is not complaint with performing it at home.   Status Achieved   PT SHORT TERM GOAL #2   Title Timed Up & Go without device with supervision <18sec. (Target Date: 08/20/2014)   Baseline 08/19/15:  15.38 sec's without AD with supervision.   Status Achieved   PT SHORT TERM GOAL #3   Title Paitent ambulates 300' with rollator walker with supervision. (Target Date: 08/20/2014)   Baseline 08/19/15: met today.   Period Months   Status Achieved           PT Long Term Goals - 07/22/15 1145    PT LONG TERM GOAL #1   Title Berg Balance >45/56 to indicate lower fall risk (Target Date: 09/18/2014)   Time 2   Period Months   Status New   PT LONG TERM GOAL #2   Title Timed Up & Go without device safely <15 sec. (Target Date: 09/18/2014)   Time 2   Period Months   Status New   PT LONG TERM GOAL #3   Title Patient ambulates 400' with rollator walker with supervision for community mobility. (Target Date: 09/18/2014)   Time 2   Period Months   Status New   PT LONG TERM GOAL #4   Title Patient ambulates 100' around furniture with LRAD modified independent. (Target Date: 09/18/2014)   Time 2   Status New   PT LONG TERM GOAL #5   Title Patient negotiates ramps, curbs with rollator walker and stairs with 1 rail with supervision. (Target Date: 09/18/2014)   Time 2   Period Months   Status New               Plan - 08/21/15 1315    Clinical Impression Statement Patient would benefit from a standard RW for safety for gait. Patient seems to understand which exercises are safe to perform independently with walker & chair behind her.    Pt will benefit from skilled therapeutic intervention in order to improve on the following deficits Abnormal gait;Decreased activity tolerance;Decreased balance;Decreased endurance;Decreased mobility;Decreased strength;Postural dysfunction;Decreased safety awareness   Rehab Potential Good   PT Frequency 2x / week   PT Duration 8 weeks   PT Treatment/Interventions ADLs/[facility];DME Instruction;Gait training;Stair training;Functional mobility training;Therapeutic activities;Therapeutic exercise;Balance training;Neuromuscular  re-education;Patient/family education   PT Next Visit Plan  Continue toward LTG's.   Consulted and Agree with Plan of Care Patient;Family member/caregiver   Family Member Consulted pt's dtr        Problem List Patient Active Problem List   Diagnosis Date Noted  . Left-sided neglect 07/01/2015  . History of right MCA stroke 07/01/2015  . Altered sensation due to stroke 04/29/2015  . Cognitive deficit, post-stroke 04/29/2015  . Pseudogout of left wrist 03/28/2015  . Hyperlipidemia LDL goal <70 03/26/2015  . Hyperglycemia 03/26/2015  . 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 Embolic stroke ([place])   . Left hemiplegia ([place])   . Homonymous hemianopia   . Chest wall trauma 01/11/2014  . Injury of left lower arm 01/11/2014  . Headache(784.0) 01/04/2013  . Allergic  rhinitis, cause unspecified 01/04/2013  . Anemia 04/19/2012  . Epistaxis, recurrent 04/19/2012  . Hypothyroidism 01/01/2008  . Essential hypertension 01/01/2008  . HYPERTENSION 01/01/2008  . MYOCARDIAL INFARCTION, ACUTE, ANTERIOR WALL 01/01/2008  . LEG CRAMPS 01/01/2008  . ADENOCARCINOMA, BREAST, HX OF 01/01/2008  . DIVERTICULOSIS, COLON, HX OF 01/01/2008  . ARTHRITIS, HX OF 01/01/2008  . RADIATION THERAPY, HX OF 01/01/2008  . CATARACT EXTRACTION, HX OF 01/01/2008  . CHOLECYSTECTOMY, LAPAROSCOPIC, HX OF 01/01/2008    Jamey Reas PT, DPT 08/21/2015, 8:23 PM  [facility] [address] [place], [place], [address] Phone: [phone]   Fax:  [phone]  Name: Leah Figueroa MRN: [id number] Date of Birth: 1923-03-03

## 2015-08-26 ENCOUNTER — Ambulatory Visit: Payer: Medicare Other | Admitting: Physical Therapy

## 2015-08-26 ENCOUNTER — Ambulatory Visit (HOSPITAL_BASED_OUTPATIENT_CLINIC_OR_DEPARTMENT_OTHER): Payer: Medicare Other | Admitting: Physical Medicine & Rehabilitation

## 2015-08-26 ENCOUNTER — Encounter: Payer: Self-pay | Admitting: Physical Medicine & Rehabilitation

## 2015-08-26 ENCOUNTER — Encounter: Payer: Medicare Other | Attending: Physical Medicine & Rehabilitation

## 2015-08-26 ENCOUNTER — Encounter: Payer: Self-pay | Admitting: Physical Therapy

## 2015-08-26 ENCOUNTER — Ambulatory Visit: Payer: Medicare Other | Admitting: Occupational Therapy

## 2015-08-26 VITALS — BP 149/67 | HR 59

## 2015-08-26 DIAGNOSIS — IMO0002 Reserved for concepts with insufficient information to code with codable children: Secondary | ICD-10-CM

## 2015-08-26 DIAGNOSIS — I10 Essential (primary) hypertension: Secondary | ICD-10-CM | POA: Insufficient documentation

## 2015-08-26 DIAGNOSIS — E785 Hyperlipidemia, unspecified: Secondary | ICD-10-CM | POA: Insufficient documentation

## 2015-08-26 DIAGNOSIS — R269 Unspecified abnormalities of gait and mobility: Secondary | ICD-10-CM

## 2015-08-26 DIAGNOSIS — R531 Weakness: Secondary | ICD-10-CM | POA: Diagnosis not present

## 2015-08-26 DIAGNOSIS — R29818 Other symptoms and signs involving the nervous system: Secondary | ICD-10-CM | POA: Diagnosis not present

## 2015-08-26 DIAGNOSIS — G8194 Hemiplegia, unspecified affecting left nondominant side: Secondary | ICD-10-CM | POA: Diagnosis not present

## 2015-08-26 DIAGNOSIS — R2681 Unsteadiness on feet: Secondary | ICD-10-CM

## 2015-08-26 DIAGNOSIS — Z8673 Personal history of transient ischemic attack (TIA), and cerebral infarction without residual deficits: Secondary | ICD-10-CM | POA: Diagnosis not present

## 2015-08-26 DIAGNOSIS — I69319 Unspecified symptoms and signs involving cognitive functions following cerebral infarction: Secondary | ICD-10-CM | POA: Diagnosis not present

## 2015-08-26 DIAGNOSIS — R414 Neurologic neglect syndrome: Secondary | ICD-10-CM

## 2015-08-26 DIAGNOSIS — G8190 Hemiplegia, unspecified affecting unspecified side: Secondary | ICD-10-CM | POA: Diagnosis not present

## 2015-08-26 DIAGNOSIS — R208 Other disturbances of skin sensation: Secondary | ICD-10-CM | POA: Diagnosis not present

## 2015-08-26 DIAGNOSIS — H539 Unspecified visual disturbance: Secondary | ICD-10-CM

## 2015-08-26 DIAGNOSIS — R6889 Other general symptoms and signs: Secondary | ICD-10-CM | POA: Diagnosis not present

## 2015-08-26 DIAGNOSIS — I69398 Other sequelae of cerebral infarction: Secondary | ICD-10-CM

## 2015-08-26 DIAGNOSIS — I69898 Other sequelae of other cerebrovascular disease: Secondary | ICD-10-CM | POA: Diagnosis not present

## 2015-08-26 DIAGNOSIS — I6931 Attention and concentration deficit following cerebral infarction: Secondary | ICD-10-CM | POA: Insufficient documentation

## 2015-08-26 DIAGNOSIS — R2689 Other abnormalities of gait and mobility: Secondary | ICD-10-CM

## 2015-08-26 DIAGNOSIS — R4189 Other symptoms and signs involving cognitive functions and awareness: Secondary | ICD-10-CM

## 2015-08-26 NOTE — Progress Notes (Signed)
Subjective:    Patient ID: Leah Figueroa, female    DOB: 1923-01-24, 82 y.o.   MRN: [id number]  HPI Patient returns today after last seeing me in clinic 07/29/2015. No further falls at home. She is using a walker to ambulate. She is receiving outpatient PT and OT. The patient is using a shower chair to shower safely.   No new medical problems. Patient is accompanied by her daughter. The patient is hard of hearing but with repetition she does understand.  Pain Inventory Average Pain 0 Pain Right Now 0 My pain is no pain  In the last 24 hours, has pain interfered with the following? General activity 0 Relation with others 0 Enjoyment of life 0 What TIME of day is your pain at its worst? no pain Sleep (in general) Good  Pain is worse with: no pain Pain improves with: no pain Relief from Meds: no pain  Mobility walk with assistance use a walker ability to climb steps?  yes do you drive?  no  Function retired I need assistance with the following:  dressing, bathing, meal prep, household duties and shopping  Neuro/Psych No problems in this area  Prior Studies Any changes since last visit?  no  Physicians involved in your care Any changes since last visit?  no   Family History  Problem Relation Age of Onset  . Colon cancer Mother   . Coronary artery disease Brother   . Breast cancer Mother    Social History   Social History  . Marital Status: Widowed    Spouse Name: N/A  . Number of Children: 1  . Years of Education: 11   Occupational History  . retired    Social History Main Topics  . Smoking status: Never Smoker   . Smokeless tobacco: Never Used  . Alcohol Use: No  . Drug Use: No  . Sexual Activity: No   Other Topics Concern  . None   Social History Narrative   HSG. Married '48- 57 yrs/widowed. 1 daughter - '59; 1 grandchild. 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  Lives alone. I-ADLs, manages books, pays bills.. End-of-Life Care: no heroic measures;  No CPR, no mechanical ventilation.  Caffeine use/day:  None. Does Patient Exercise:  No       04/29/15 currently resident at [facility] rehab      Past Surgical History  Procedure Laterality Date  . Right thyroid lobectomy    . Cataract extraction      left-2006, right 2011  . Intrathoracic goiter resection    . Cholecystectomy    . Lumpectomy for breast cancer Left 80's    radiation therapy  . Back surgery     Past Medical History  Diagnosis Date  . Other specified personal history presenting hazards to health(V15.89)   . Personal history of arthritis   . Unspecified personal history presenting hazards to health   . Cramp of limb   . Acute myocardial infarction of other anterior wall, episode of care unspecified   . Personal history of malignant neoplasm of breast   . Unspecified essential hypertension   . Personal history of other diseases of digestive system   . Unspecified hypothyroidism   . Other and unspecified hyperlipidemia   . Stroke [facility]) 03/2015    "left sided issues"   BP 149/67 mmHg  Pulse 59  SpO2 97%  Opioid Risk Score:   Fall Risk Score:  `1  Depression screen PHQ 2/9  Depression screen [facility] 2/9  04/29/2015  Decreased Interest 2  Down, Depressed, Hopeless 3  PHQ - 2 Score 5  Altered sleeping 2  Tired, decreased energy 0  Change in appetite 3  Feeling bad or failure about yourself  2  Trouble concentrating 0  Moving slowly or fidgety/restless 0  Suicidal thoughts 0  PHQ-9 Score 12  Difficult doing work/chores Not difficult at all     Review of Systems  All other systems reviewed and are negative.      Objective:   Physical Exam  Constitutional: She is oriented to person, place, and time. She appears well-developed and well-nourished.  HENT:  Head: Normocephalic and atraumatic.  Neurological: She is alert and oriented to person, place, and time. She displays no atrophy and no tremor. A sensory deficit is present. She displays a negative  Romberg sign. Coordination and gait abnormal.  Reflex Scores:      Tricep reflexes are 2+ on the right side and 3+ on the left side.      Bicep reflexes are 2+ on the right side and 3+ on the left side.      Brachioradialis reflexes are 2+ on the right side and 3+ on the left side.      Patellar reflexes are 2+ on the right side and 3+ on the left side.      Achilles reflexes are 2+ on the right side and 3+ on the left side. Decreased sensation to light touch and proprioception left upper and left lower limb. Motor strength is 5 minus in the left upper extremity and 5 in the right upper extremity 5/5 in the right lower extremity 5 minus in the left lower extremity  Ambulates with a walker no evidence of toe drag or knee instability. She sometimes has decreased accuracy of foot placement  Mild apraxia noted  Psychiatric: She has a normal mood and affect.  Nursing note and vitals reviewed.         Assessment & Plan:  1. Right MCA distribution infarct with residual mild left hemiparesis, major neurologic deficit includes left hemisensory deficits which are impacting her dynamic balance Patient continues to receive outpatient OT as well as outpatient PT, twice a week Reviewed therapy status as well as progress. Reviewed timeline of recovery  Her safety awareness has improved no further falls since last visit.  Still not recommending ambulation without walker. Needs supervision with bathing  Also recommend against driving  Patient will follow up with neurology in about 6 months Patient will follow up with primary physician, Dr. Pete GlatterStoneking, 09/08/2015.  I'll see the patient back in 2 months  We also discussed whether she would like a speech therapy referral. According to her daughter her memory is not back to baseline. Patient states that she feels like she has to much therapy already

## 2015-08-26 NOTE — Therapy (Signed)
[facility] [address] [place], [place], [address] Phone: [phone]   Fax:  [phone]  Occupational Therapy Treatment  Patient Details  Name: Leah Figueroa MRN: [id number] Date of Birth: [birth year] Referring Provider: Dr. Alysia Penna  Encounter Date: 08/26/2015      OT End of Session - 08/26/15 1234    Visit Number 9   Number of Visits 17   Date for OT Re-Evaluation 09/20/15   Authorization Type MCR - G code needed   Authorization - Visit Number 9   Authorization - Number of Visits 10   OT Start Time 1749   OT Stop Time 1230   OT Time Calculation (min) 45 min   Activity Tolerance Patient tolerated treatment well      Past Medical History  Diagnosis Date  . Other specified personal history presenting hazards to health(V15.89)   . Personal history of arthritis   . Unspecified personal history presenting hazards to health   . Cramp of limb   . Acute myocardial infarction of other anterior wall, episode of care unspecified   . Personal history of malignant neoplasm of breast   . Unspecified essential hypertension   . Personal history of other diseases of digestive system   . Unspecified hypothyroidism   . Other and unspecified hyperlipidemia   . Stroke [facility]) 03/2015    "left sided issues"    Past Surgical History  Procedure Laterality Date  . Right thyroid lobectomy    . Cataract extraction      left-2006, right 2011  . Intrathoracic goiter resection    . Cholecystectomy    . Lumpectomy for breast cancer Left 80's    radiation therapy  . Back surgery      There were no vitals filed for this visit.  Visit Diagnosis:  Lack of coordination due to stroke  Impaired cognition  Vision disturbance following cerebrovascular accident      Subjective Assessment - 08/26/15 1154    Subjective  Daughter reports pt donned shirt today independently   Patient is accompained by: Family member    Pertinent History back surgery, breast CA   Limitations HOH   Patient Stated Goals to drop less from Lt hand   Currently in Pain? No/denies                      OT Treatments/Exercises (OP) - 08/26/15 0001    ADLs   ADL Comments Discussed ways to encourage independence at home with BADLS. Pt donned shirt independently today. Daughter reports she hasn't practiced bathing with recommendations yet. Also encouraged pt/daughter to let pt get snacks in kitchen with rollator and supervision   Fine Motor Coordination   Fine Motor Coordination Small Pegboard   Small Pegboard Pt placing small pegs in pegboard Lt hand with mod difficulty and drops. Pt copying design with mod questioning cues and mod to max assist to correct mistakes. Pt unable to id spacing of pegs in rows that were left empty. Pt required extra time due to multiple drops/use of Lt hand and with difficulty copying design. Pt unable to independently [place] correct mistakes due to cognitive and visual/perceptual deficits                  OT Short Term Goals - 08/21/15 1700    OT SHORT TERM GOAL #1   Title Indepndent with HEP for Lt hand coordination and strength (all STG's due 08/21/15)    Time  4   Period Weeks   Status Partially Met  08/21/15: Cueing required to perform correctly   OT SHORT TERM GOAL #2   Title Pt to verbalize understanding with visual strategies for Lt inattention and safety considerations with impaired sensation LUE   Time 4   Period Weeks   Status Partially Met  issued and educated pt/daughter, but pt requires cues to implement   OT SHORT TERM GOAL #3   Title Indpependent with all BADLS   Baseline 0/92/33- pt needs mod A per daughter   Time 4   Period Weeks   Status Not Met  daughter reports max assist for UE dressing, and min assist occasionally for donning pants; max assist for bathing. (However, in clinic pt demo ability to don/doff shirt with min to mod cueing and little to no assist)    OT SHORT TERM GOAL #4   Title Improve grip strength Lt hand to 25 lbs or greater to assist with opening containers   Baseline 08/18/14- 25 lbs   Time 4   Period Weeks   Status Achieved   OT SHORT TERM GOAL #5   Title Improve coordination as evidenced by performing 9 hole peg test in 75 sec. or less Lt hand   Baseline 08/18/14-64.47   Time 4   Period Weeks   Status Achieved           OT Long Term Goals - 08/07/15 1507    OT LONG TERM GOAL #1   Title Independent with updated HEP (ALL LTG's due 09/20/15)   Time 8   Period Weeks   Status On-going   OT LONG TERM GOAL #2   Title Independent with simple cold meal prep and microwaveable items   Baseline dependent   Time 8   Period Weeks   Status On-going   OT LONG TERM GOAL #3   Title Improve coordination as evidenced by performing 9 hole peg test in 60 sec. or under Lt hand   Baseline 93.37 sec   Time 8   Period Weeks   Status On-going   OT LONG TERM GOAL #4   Title Pt to perform enviornmental scanning with 90% accuracy or greater   Time 8   Period Weeks   Status On-going   OT LONG TERM GOAL #5   Title Pt to return to simple financial management and medication management with supervision/cueing prn for accuracy/safety   Time 8   Period Weeks   Status On-going               Plan - 08/26/15 1235    Clinical Impression Statement Pt progressing with coordination Lt hand with multiple drops during fine motor tasks.    Rehab Potential Good   Clinical Impairments Affecting Rehab Potential cognition   Plan simple snack prep, safety in kitchen with rollator negotiation, G-CODE!   OT Home Exercise Plan coordination and putty HEP 07/28/15.    Consulted and Agree with Plan of Care Patient;Family member/caregiver   Family Member Consulted daughter        Problem List Patient Active Problem List   Diagnosis Date Noted  . Left-sided neglect 07/01/2015  . History of right MCA stroke 07/01/2015  . Altered sensation  due to stroke 04/29/2015  . Cognitive deficit, post-stroke 04/29/2015  . Pseudogout of left wrist 03/28/2015  . Hyperlipidemia LDL goal <70 03/26/2015  . Hyperglycemia 03/26/2015  . 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 Embolic stroke ([place])   .  Left hemiplegia ([place])   . Homonymous hemianopia   . Chest wall trauma 01/11/2014  . Injury of left lower arm 01/11/2014  . Headache(784.0) 01/04/2013  . Allergic rhinitis, cause unspecified 01/04/2013  . Anemia 04/19/2012  . Epistaxis, recurrent 04/19/2012  . Hypothyroidism 01/01/2008  . Essential hypertension 01/01/2008  . HYPERTENSION 01/01/2008  . MYOCARDIAL INFARCTION, ACUTE, ANTERIOR WALL 01/01/2008  . LEG CRAMPS 01/01/2008  . ADENOCARCINOMA, BREAST, HX OF 01/01/2008  . DIVERTICULOSIS, COLON, HX OF 01/01/2008  . ARTHRITIS, HX OF 01/01/2008  . RADIATION THERAPY, HX OF 01/01/2008  . CATARACT EXTRACTION, HX OF 01/01/2008  . CHOLECYSTECTOMY, LAPAROSCOPIC, HX OF 01/01/2008    Carey Bullocks, OTR/L 08/26/2015, 12:36 PM  [facility] [address] [place], [place], [address] Phone: [phone]   Fax:  [phone]  Name: Asjia Berrios Risko MRN: [id number] Date of Birth: Nov 20, 1922

## 2015-08-26 NOTE — Therapy (Signed)
[facility] [address] [place], [place], [address] Phone: [phone]   Fax:  [phone]  Physical Therapy Treatment  Patient Details  Name: Leah Figueroa MRN: [id number] Date of Birth: Oct 02, 1922 Referring Provider: Claudette Laws, MD  Encounter Date: 08/26/2015      PT End of Session - 08/26/15 1315    Visit Number 9   Number of Visits 16   Date for PT Re-Evaluation 09/19/15   Authorization Type Medicare G-Code & progress note every 10 visits   PT Start Time 1231   PT Stop Time 1319   PT Time Calculation (min) 48 min   Equipment Utilized During Treatment Gait belt   Activity Tolerance Patient tolerated treatment well   Behavior During Therapy [facility] for tasks assessed/performed      Past Medical History  Diagnosis Date  . Other specified personal history presenting hazards to health(V15.89)   . Personal history of arthritis   . Unspecified personal history presenting hazards to health   . Cramp of limb   . Acute myocardial infarction of other anterior wall, episode of care unspecified   . Personal history of malignant neoplasm of breast   . Unspecified essential hypertension   . Personal history of other diseases of digestive system   . Unspecified hypothyroidism   . Other and unspecified hyperlipidemia   . Stroke [facility]) 03/2015    "left sided issues"    Past Surgical History  Procedure Laterality Date  . Right thyroid lobectomy    . Cataract extraction      left-2006, right 2011  . Intrathoracic goiter resection    . Cholecystectomy    . Lumpectomy for breast cancer Left 80's    radiation therapy  . Back surgery      There were no vitals filed for this visit.  Visit Diagnosis:  Lack of coordination due to stroke  Impaired cognition  Abnormality of gait  Weakness due to cerebrovascular accident  Unsteadiness  Balance problems  Decreased functional activity tolerance      Subjective  Assessment - 08/26/15 1231    Subjective No falls. She reports doing exercises more now that PT went over which ones to do in front of chair & which ones to have daughter supervising.   Currently in Pain? No/denies     [place] [facility]: See pt education. 20 minute discussion on safety & fall risk issues including compliance with RW use and patient able to stay alone. Currently they are paying someone to stay overnight with Leah Figueroa 3 nights/wk and daughter stays the other 4 nights. When caregiver is there, she assists Leah Figueroa to bathroom ~6:30 and puts her back to bed. Then daughter shows up ~7:00-7:30 for the day. PT recommended using new RW for all mobility in home with goal for daughter to only monitor with no assistance or cueing required.   Gait Training: PT received order from Dr. Wynn Banker for youth rolling walker. PT issued from [facility] Equipment closet and adjusted height for patient. Patient ambulated 200' around furniture carrying conversation and scanning for objects at PT request with no balance losses. Patient negotiated ramps & curbs 2 reps separated by discussion to monitor carryover. Patient performs without assistance with cueing on sequencing and technique both reps.                             PT Education - 08/26/15 1315    Education  provided Yes   Education Details safety issues with staying alone including consistency with use of RW, accessories for walker to aid ADLs like carrying cup of coffee with lid, lowering bed height by replacing box spring with padded plywood     Person(s) Educated Patient;Child(ren)   Methods Explanation   Comprehension Verbalized understanding          PT Short Term Goals - 08/19/15 1236    PT SHORT TERM GOAL #1   Title Patient demonstrates understanding of inital HEP. (Target Date: 08/20/2014)   Baseline 08/19/15: pt returns demo with visual cues, some verbal cues of OTAGO program. Per pt/daughter report she is not  complaint with performing it at home.   Status Achieved   PT SHORT TERM GOAL #2   Title Timed Up & Go without device with supervision <18sec. (Target Date: 08/20/2014)   Baseline 08/19/15: 15.38 sec's without AD with supervision.   Status Achieved   PT SHORT TERM GOAL #3   Title Paitent ambulates 300' with rollator walker with supervision. (Target Date: 08/20/2014)   Baseline 08/19/15: met today.   Period Months   Status Achieved           PT Long Term Goals - 07/22/15 1145    PT LONG TERM GOAL #1   Title Berg Balance >45/56 to indicate lower fall risk (Target Date: 09/18/2014)   Time 2   Period Months   Status New   PT LONG TERM GOAL #2   Title Timed Up & Go without device safely <15 sec. (Target Date: 09/18/2014)   Time 2   Period Months   Status New   PT LONG TERM GOAL #3   Title Patient ambulates 400' with rollator walker with supervision for community mobility. (Target Date: 09/18/2014)   Time 2   Period Months   Status New   PT LONG TERM GOAL #4   Title Patient ambulates 100' around furniture with LRAD modified independent. (Target Date: 09/18/2014)   Time 2   Status New   PT LONG TERM GOAL #5   Title Patient negotiates ramps, curbs with rollator walker and stairs with 1 rail with supervision. (Target Date: 09/18/2014)   Time 2   Period Months   Status New               Plan - 08/26/15 1327    Clinical Impression Statement Patient appeared safer with standard rolling walker compared to rollator walker. Patient's daughter reports they have progressed level of supervision in home to "ear shot" except to bathroom. Daughter reports understanding how to lower height of bed by replacing box spring with padded plywood. Daughter to investigate that change & go to equipment  store to look at walker accessories./   Pt will benefit from skilled therapeutic intervention in order to improve on the following deficits Abnormal gait;Decreased activity tolerance;Decreased balance;Decreased  endurance;Decreased mobility;Decreased strength;Postural dysfunction;Decreased safety awareness   Rehab Potential Good   PT Frequency 2x / week   PT Duration 8 weeks   PT Treatment/Interventions ADLs/[place] [facility] Management;DME Instruction;Gait training;Stair training;Functional mobility training;Therapeutic activities;Therapeutic exercise;Balance training;Neuromuscular re-education;Patient/family education   PT Next Visit Plan  Do Berg Balance for G-Code, Continue toward LTG's.   Consulted and Agree with Plan of Care Patient;Family member/caregiver   Family Member Consulted pt's dtr        Problem List Patient Active Problem List   Diagnosis Date Noted  . Left-sided neglect 07/01/2015  . History of right MCA stroke 07/01/2015  .  Altered sensation due to stroke 04/29/2015  . Cognitive deficit, post-stroke 04/29/2015  . Pseudogout of left wrist 03/28/2015  . Hyperlipidemia LDL goal <70 03/26/2015  . Hyperglycemia 03/26/2015  . 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 Embolic stroke ([place])   . Left hemiplegia ([place])   . Homonymous hemianopia   . Chest wall trauma 01/11/2014  . Injury of left lower arm 01/11/2014  . Headache(784.0) 01/04/2013  . Allergic rhinitis, cause unspecified 01/04/2013  . Anemia 04/19/2012  . Epistaxis, recurrent 04/19/2012  . Hypothyroidism 01/01/2008  . Essential hypertension 01/01/2008  . HYPERTENSION 01/01/2008  . MYOCARDIAL INFARCTION, ACUTE, ANTERIOR WALL 01/01/2008  . LEG CRAMPS 01/01/2008  . ADENOCARCINOMA, BREAST, HX OF 01/01/2008  . DIVERTICULOSIS, COLON, HX OF 01/01/2008  . ARTHRITIS, HX OF 01/01/2008  . RADIATION THERAPY, HX OF 01/01/2008  . CATARACT EXTRACTION, HX OF 01/01/2008  . CHOLECYSTECTOMY, LAPAROSCOPIC, HX OF 01/01/2008    Jamey Reas PT, DPT 08/27/2015, 6:59 AM  [facility] [address] [place], [place], [address] Phone:  [phone]   Fax:  [phone]  Name: Leah Figueroa MRN: [id number] Date of Birth: Aug 16, 1923

## 2015-08-26 NOTE — Patient Instructions (Signed)
Continue to use the walker for ambulation  No driving  Continue to use the shower chair for bathing as well as having some supervision

## 2015-08-27 ENCOUNTER — Encounter: Payer: Self-pay | Admitting: Physical Therapy

## 2015-08-27 ENCOUNTER — Ambulatory Visit: Payer: Medicare Other | Admitting: Physical Therapy

## 2015-08-27 ENCOUNTER — Ambulatory Visit: Payer: Medicare Other | Admitting: Occupational Therapy

## 2015-08-27 ENCOUNTER — Encounter: Payer: Self-pay | Admitting: Occupational Therapy

## 2015-08-27 DIAGNOSIS — R6889 Other general symptoms and signs: Secondary | ICD-10-CM

## 2015-08-27 DIAGNOSIS — IMO0002 Reserved for concepts with insufficient information to code with codable children: Secondary | ICD-10-CM

## 2015-08-27 DIAGNOSIS — R2689 Other abnormalities of gait and mobility: Secondary | ICD-10-CM

## 2015-08-27 DIAGNOSIS — R2681 Unsteadiness on feet: Secondary | ICD-10-CM

## 2015-08-27 DIAGNOSIS — R29818 Other symptoms and signs involving the nervous system: Secondary | ICD-10-CM | POA: Diagnosis not present

## 2015-08-27 DIAGNOSIS — R269 Unspecified abnormalities of gait and mobility: Secondary | ICD-10-CM | POA: Diagnosis not present

## 2015-08-27 DIAGNOSIS — R531 Weakness: Secondary | ICD-10-CM | POA: Diagnosis not present

## 2015-08-27 DIAGNOSIS — I69898 Other sequelae of other cerebrovascular disease: Secondary | ICD-10-CM | POA: Diagnosis not present

## 2015-08-27 NOTE — Therapy (Signed)
[facility] [address] [place], [place], [address] Phone: [phone]   Fax:  [phone]  Occupational Therapy Treatment  Patient Details  Name: Leah Figueroa MRN: [id number] Date of Birth: 1923/05/05 Referring Provider: Dr. Alysia Penna  Encounter Date: 08/27/2015      OT End of Session - 08/27/15 1325    Visit Number 10   Number of Visits 17   Date for OT Re-Evaluation 09/20/15   Authorization Type MCR - G code needed   Authorization - Visit Number 10   Authorization - Number of Visits 10   OT Start Time 5170   OT Stop Time 1315   OT Time Calculation (min) 45 min   Activity Tolerance Patient tolerated treatment well      Past Medical History  Diagnosis Date  . Other specified personal history presenting hazards to health(V15.89)   . Personal history of arthritis   . Unspecified personal history presenting hazards to health   . Cramp of limb   . Acute myocardial infarction of other anterior wall, episode of care unspecified   . Personal history of malignant neoplasm of breast   . Unspecified essential hypertension   . Personal history of other diseases of digestive system   . Unspecified hypothyroidism   . Other and unspecified hyperlipidemia   . Stroke [facility]) 03/2015    "left sided issues"    Past Surgical History  Procedure Laterality Date  . Right thyroid lobectomy    . Cataract extraction      left-2006, right 2011  . Intrathoracic goiter resection    . Cholecystectomy    . Lumpectomy for breast cancer Left 80's    radiation therapy  . Back surgery      There were no vitals filed for this visit.  Visit Diagnosis:  Decreased functional activity tolerance  Weakness due to cerebrovascular accident      Subjective Assessment - 08/27/15 1231    Subjective  Daughter reports pt dressed herself again today after laying clothes out for her   Patient is accompained by: Family member   daughter   Pertinent History back surgery, breast CA   Limitations HOH   Patient Stated Goals to drop less from Lt hand   Currently in Pain? No/denies            [facility] OT Assessment - 08/27/15 0001    Coordination   Left 9 Hole Peg Test 53.65 sec   Hand Function   Left Hand Grip (lbs) 22 lbs                  OT Treatments/Exercises (OP) - 08/27/15 0001    ADLs   Functional Mobility Discussed walker safety and negotiation of walker with retrieving objects out of refrigerator and cabinets in kitchen and bathroom using walker for fall prevention. Therapist demo, then pt return demo with min v.c's. Daughter present for education. Also, discussed safety with getting into/out of chair at dining room table.    ADL Comments Assessed coordination and grip strength with improvements. Also discussed LTG's and will revise and/or d/c goals that are no longer appropriate. Pt met coordination goal.                   OT Short Term Goals - 08/21/15 1700    OT SHORT TERM GOAL #1   Title Indepndent with HEP for Lt hand coordination and strength (all STG's due 08/21/15)    Time 4  Period Weeks   Status Partially Met  08/21/15: Cueing required to perform correctly   OT SHORT TERM GOAL #2   Title Pt to verbalize understanding with visual strategies for Lt inattention and safety considerations with impaired sensation LUE   Time 4   Period Weeks   Status Partially Met  issued and educated pt/daughter, but pt requires cues to implement   OT SHORT TERM GOAL #3   Title Indpependent with all BADLS   Baseline 0/62/37- pt needs mod A per daughter   Time 4   Period Weeks   Status Not Met  daughter reports max assist for UE dressing, and min assist occasionally for donning pants; max assist for bathing. (However, in clinic pt demo ability to don/doff shirt with min to mod cueing and little to no assist)   OT SHORT TERM GOAL #4   Title Improve grip strength Lt hand to 25 lbs or  greater to assist with opening containers   Baseline 08/18/14- 25 lbs   Time 4   Period Weeks   Status Achieved   OT SHORT TERM GOAL #5   Title Improve coordination as evidenced by performing 9 hole peg test in 75 sec. or less Lt hand   Baseline 08/18/14-64.47   Time 4   Period Weeks   Status Achieved           OT Long Term Goals - 08/28/15 1327    OT LONG TERM GOAL #1   Title Independent with updated HEP (ALL LTG's due 09/20/15)   Time 8   Period Weeks   Status On-going   OT LONG TERM GOAL #2   Title Independent with retrieving snack and drink from kitchen with DME prn   Baseline dependent   Time 8   Period Weeks   Status Revised   OT LONG TERM GOAL #3   Title Improve coordination as evidenced by performing 9 hole peg test in 60 sec. or under Lt hand   Baseline 93.37 sec   Time 8   Period Weeks   Status Achieved  2015-08-28: 53.65 sec   OT LONG TERM GOAL #4   Title Pt to perform enviornmental scanning with 90% accuracy or greater   Time 8   Period Weeks   Status On-going   OT LONG TERM GOAL #5   Title Pt to return to simple financial management and medication management with supervision/cueing prn for accuracy/safety   Time 8   Period Weeks   Status Deferred  Pt's daughter has taken over financial management due to cognitive deficits               Plan - 08-28-15 1330    Clinical Impression Statement Pt met LTG #3. Revised and deferred LTGs due to no longer appropriate with patients cognitive status.    Plan environmental scanning and walker negotiation through obstacles   OT Home Exercise Plan coordination and putty HEP 07/28/15.    Consulted and Agree with Plan of Care Patient;Family member/caregiver   Family Member Consulted daughter          G-Codes - 2015-08-28 1233    Functional Assessment Tool Used LUE: 9 hole peg test = 53.65 sec. , grip strength = 22 lbs. avg   Functional Limitation Carrying, moving and handling objects   Carrying, Moving and  Handling Objects Current Status (S2831) At least 40 percent but less than 60 percent impaired, limited or restricted   Carrying, Moving and Handling Objects Goal Status (D1761)  At least 20 percent but less than 40 percent impaired, limited or restricted      Problem List Patient Active Problem List   Diagnosis Date Noted  . Left-sided neglect 07/01/2015  . History of right MCA stroke 07/01/2015  . Altered sensation due to stroke 04/29/2015  . Cognitive deficit, post-stroke 04/29/2015  . Pseudogout of left wrist 03/28/2015  . Hyperlipidemia LDL goal <70 03/26/2015  . Hyperglycemia 03/26/2015  . 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 Embolic stroke ([place])   . Left hemiplegia ([place])   . Homonymous hemianopia   . Chest wall trauma 01/11/2014  . Injury of left lower arm 01/11/2014  . Headache(784.0) 01/04/2013  . Allergic rhinitis, cause unspecified 01/04/2013  . Anemia 04/19/2012  . Epistaxis, recurrent 04/19/2012  . Hypothyroidism 01/01/2008  . Essential hypertension 01/01/2008  . HYPERTENSION 01/01/2008  . MYOCARDIAL INFARCTION, ACUTE, ANTERIOR WALL 01/01/2008  . LEG CRAMPS 01/01/2008  . ADENOCARCINOMA, BREAST, HX OF 01/01/2008  . DIVERTICULOSIS, COLON, HX OF 01/01/2008  . ARTHRITIS, HX OF 01/01/2008  . RADIATION THERAPY, HX OF 01/01/2008  . CATARACT EXTRACTION, HX OF 01/01/2008  . CHOLECYSTECTOMY, LAPAROSCOPIC, HX OF 01/01/2008    Carey Bullocks, OTR/L 08/27/2015, 1:32 PM  [facility] [address] [place], [place], [address] Phone: [phone]   Fax:  [phone]  Name: Leah Figueroa MRN: [id number] Date of Birth: 02/04/1923

## 2015-08-27 NOTE — Therapy (Signed)
[facility] [address] [place], [place], [address] Phone: [phone]   Fax:  [phone]  Physical Therapy Treatment  Patient Details  Name: Leah Figueroa MRN: [id number] Date of Birth: 08-14-1923 Referring Provider: Alysia Penna, MD  Encounter Date: 08/27/2015      PT End of Session - 08/27/15 1319    Visit Number 10   Number of Visits 16   Date for PT Re-Evaluation 09/19/15   Authorization Type Medicare G-Code & progress note every 10 visits   PT Start Time 6712   PT Stop Time 1356   PT Time Calculation (min) 39 min   Equipment Utilized During Treatment Gait belt   Activity Tolerance Patient tolerated treatment well   Behavior During Therapy [facility] for tasks assessed/performed      Past Medical History  Diagnosis Date  . Other specified personal history presenting hazards to health(V15.89)   . Personal history of arthritis   . Unspecified personal history presenting hazards to health   . Cramp of limb   . Acute myocardial infarction of other anterior wall, episode of care unspecified   . Personal history of malignant neoplasm of breast   . Unspecified essential hypertension   . Personal history of other diseases of digestive system   . Unspecified hypothyroidism   . Other and unspecified hyperlipidemia   . Stroke [facility]) 03/2015    "left sided issues"    Past Surgical History  Procedure Laterality Date  . Right thyroid lobectomy    . Cataract extraction      left-2006, right 2011  . Intrathoracic goiter resection    . Cholecystectomy    . Lumpectomy for breast cancer Left 80's    radiation therapy  . Back surgery      There were no vitals filed for this visit.  Visit Diagnosis:  Decreased functional activity tolerance  Abnormality of gait  Unsteadiness  Balance problems  Weakness due to cerebrovascular accident  Lack of coordination due to stroke      Subjective Assessment -  08/27/15 1318    Subjective No new complaints. No falls to report. Daughter reported (out of pt's earshot) that pt has been walking on own with walker, not safety, she lifts the walker to move it as needed.    Patient is accompained by: Family member  daughter   Diagnostic tests She wants to be able to be left alone.    Currently in Pain? No/denies   Pain Score 0-No pain            [facility] Adult PT Treatment/Exercise - 08/27/15 1324    Ambulation/Gait   Ambulation/Gait Yes   Ambulation/Gait Assistance 5: Supervision   Ambulation Distance (Feet) 330 Feet  x1, >/= 600 ft   Assistive device Rolling walker   Gait Pattern Step-through pattern;Decreased step length - left;Decreased stride length;Decreased dorsiflexion - left;Trunk flexed;Narrow base of support   Ambulation Surface Level;Indoor   Ramp Other (comment)  min guard assist with walker   Ramp Details (indicate cue type and reason) cues on posture, walker position and sequencing   Curb Other (comment)  min guard assist with walker   Berg Balance Test   Sit to Stand Able to stand without using hands and stabilize independently   Standing Unsupported Able to stand safely 2 minutes   Sitting with Back Unsupported but Feet Supported on Floor or Stool Able to sit safely and securely 2 minutes   Stand to Sit Sits safely  with minimal use of hands   Transfers Able to transfer safely, minor use of hands   Standing Unsupported with Eyes Closed Able to stand 10 seconds with supervision   Standing Ubsupported with Feet Together Able to place feet together independently and stand for 1 minute with supervision   From Standing, Reach Forward with Outstretched Arm Can reach forward >12 cm safely (5")  6 inches   From Standing Position, Pick up Object from Floor Able to pick up shoe, needs supervision   From Standing Position, Turn to Look Behind Over each Shoulder Looks behind one side only/other side shows less weight shift  right > left    Turn 360 Degrees Able to turn 360 degrees safely one side only in 4 seconds or less  > 6 seconds each way   Standing Unsupported, Alternately Place Feet on Step/Stool Able to stand independently and safely and complete 8 steps in 20 seconds  11.29 sec's   Standing Unsupported, One Foot in Front Able to plae foot ahead of the other independently and hold 30 seconds   Standing on One Leg Able to lift leg independently and hold 5-10 seconds   Total Score 48            PT Short Term Goals - 08/19/15 1236    PT SHORT TERM GOAL #1   Title Patient demonstrates understanding of inital HEP. (Target Date: 08/20/2014)   Baseline 08/19/15: pt returns demo with visual cues, some verbal cues of OTAGO program. Per pt/daughter report she is not complaint with performing it at home.   Status Achieved   PT SHORT TERM GOAL #2   Title Timed Up & Go without device with supervision <18sec. (Target Date: 08/20/2014)   Baseline 08/19/15: 15.38 sec's without AD with supervision.   Status Achieved   PT SHORT TERM GOAL #3   Title Paitent ambulates 300' with rollator walker with supervision. (Target Date: 08/20/2014)   Baseline 08/19/15: met today.   Period Months   Status Achieved           PT Long Term Goals - 08/28/15 1307    PT LONG TERM GOAL #1   Title Berg Balance >45/56 to indicate lower fall risk (Target Date: 09/18/2014)   Baseline 08/27/15: scored 48/56 today   Status Achieved   PT LONG TERM GOAL #2   Title Timed Up & Go without device safely <15 sec. (Target Date: 09/18/2014)   Time 2   Period Months   Status On-going   PT LONG TERM GOAL #3   Title Patient ambulates 400' with rollator walker with supervision for community mobility. (Target Date: 09/18/2014)   Time 2   Period Months   Status On-going   PT LONG TERM GOAL #4   Title Patient ambulates 100' around furniture with LRAD modified independent. (Target Date: 09/18/2014)   Time 2   Status On-going   PT LONG TERM GOAL #5   Title Patient  negotiates ramps, curbs with rollator walker and stairs with 1 rail with supervision. (Target Date: 09/18/2014)   Time 2   Period Months   Status On-going               Plan - 08/27/15 1319    Clinical Impression Statement Pt's Berg Balance test score has improved significantly from initial evaluation (increased from 29/56 to 48/56 today). Pt is also making improvements in safety with use of standerd RW.   Pt will benefit from skilled therapeutic intervention in order to  improve on the following deficits Abnormal gait;Decreased activity tolerance;Decreased balance;Decreased endurance;Decreased mobility;Decreased strength;Postural dysfunction;Decreased safety awareness   Rehab Potential Good   PT Frequency 2x / week   PT Duration 8 weeks   PT Treatment/Interventions ADLs/[facility];DME Instruction;Gait training;Stair training;Functional mobility training;Therapeutic activities;Therapeutic exercise;Balance training;Neuromuscular re-education;Patient/family education   PT Next Visit Plan  Continue toward LTG's.   Consulted and Agree with Plan of Care Patient;Family member/caregiver   Family Member Consulted pt's dtr          G-Codes - 09-01-2015 1309    Functional Assessment Tool Used Berg Balance 48/56      Problem List Patient Active Problem List   Diagnosis Date Noted  . Left-sided neglect 07/01/2015  . History of right MCA stroke 07/01/2015  . Altered sensation due to stroke 04/29/2015  . Cognitive deficit, post-stroke 04/29/2015  . Pseudogout of left wrist 03/28/2015  . Hyperlipidemia LDL goal <70 03/26/2015  . Hyperglycemia 03/26/2015  . 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 Embolic stroke ([place])   . Left hemiplegia ([place])   . Homonymous hemianopia   . Chest wall trauma 01/11/2014  . Injury of left lower arm 01/11/2014  . Headache(784.0) 01/04/2013  . Allergic rhinitis, cause unspecified 01/04/2013  . Anemia  04/19/2012  . Epistaxis, recurrent 04/19/2012  . Hypothyroidism 01/01/2008  . Essential hypertension 01/01/2008  . HYPERTENSION 01/01/2008  . MYOCARDIAL INFARCTION, ACUTE, ANTERIOR WALL 01/01/2008  . LEG CRAMPS 01/01/2008  . ADENOCARCINOMA, BREAST, HX OF 01/01/2008  . DIVERTICULOSIS, COLON, HX OF 01/01/2008  . ARTHRITIS, HX OF 01/01/2008  . RADIATION THERAPY, HX OF 01/01/2008  . CATARACT EXTRACTION, HX OF 01/01/2008  . CHOLECYSTECTOMY, LAPAROSCOPIC, HX OF 01/01/2008    Willow Ora 08/28/2015, 1:09 PM  Willow Ora, PTA, [place] [address] [place], [place] [address] [phone] 08/28/2015, 1:09 PM   Name: Leah Figueroa MRN: [id number] Date of Birth: October 13, 1922

## 2015-08-28 ENCOUNTER — Encounter: Payer: Self-pay | Admitting: Physical Therapy

## 2015-08-28 NOTE — Therapy (Signed)
[facility] [address] [place], [place], [address] Phone: [phone]   Fax:  [phone]  Patient Details  Name: Leah Figueroa MRN: [id number] Date of Birth: 10-08-1922 Referring Provider:  Alysia Penna, MD  Encounter Date: 08/28/2015  Physical Therapy Progress Note  Dates of Reporting Period: 07/22/2015 to 08/27/2015  Objective Reports of Subjective Statement: Patient and daughter report less balance losses with ADLs  Objective Measurements: Berg Balance improved from 29/56 to 48/56  Goal Update:      PT Short Term Goals - 08/19/15 1236    PT SHORT TERM GOAL #1   Title Patient demonstrates understanding of inital HEP. (Target Date: 08/20/2014)   Baseline 08/19/15: pt returns demo with visual cues, some verbal cues of OTAGO program. Per pt/daughter report she is not complaint with performing it at home.   Status Achieved   PT SHORT TERM GOAL #2   Title Timed Up & Go without device with supervision <18sec. (Target Date: 08/20/2014)   Baseline 08/19/15: 15.38 sec's without AD with supervision.   Status Achieved   PT SHORT TERM GOAL #3   Title Paitent ambulates 300' with rollator walker with supervision. (Target Date: 08/20/2014)   Baseline 08/19/15: met today.   Period Months   Status Achieved      Plan: Continue with plan of care established at evaluation  Reason Skilled Services are Required: Patient continues to require skilled care / instruction to progress balance & gait to maximal functional potential.     Akshar Starnes PT, DPT 08/28/2015, 3:23 PM  [facility] [address] [place], [place], [address] Phone: [phone]   Fax:  [phone]

## 2015-09-03 ENCOUNTER — Emergency Department (HOSPITAL_COMMUNITY): Payer: Medicare Other

## 2015-09-03 ENCOUNTER — Encounter (HOSPITAL_COMMUNITY): Payer: Self-pay | Admitting: Internal Medicine

## 2015-09-03 ENCOUNTER — Observation Stay (HOSPITAL_COMMUNITY)
Admission: EM | Admit: 2015-09-03 | Discharge: 2015-09-05 | Disposition: A | Discharge status: Home / Self Care | Payer: Medicare Other | Attending: Internal Medicine | Admitting: Internal Medicine

## 2015-09-03 ENCOUNTER — Ambulatory Visit: Payer: Medicare Other | Admitting: Occupational Therapy

## 2015-09-03 ENCOUNTER — Ambulatory Visit: Payer: Medicare Other | Admitting: Physical Therapy

## 2015-09-03 DIAGNOSIS — Y998 Other external cause status: Secondary | ICD-10-CM | POA: Insufficient documentation

## 2015-09-03 DIAGNOSIS — R55 Syncope and collapse: Secondary | ICD-10-CM | POA: Diagnosis present

## 2015-09-03 DIAGNOSIS — I2510 Atherosclerotic heart disease of native coronary artery without angina pectoris: Secondary | ICD-10-CM | POA: Insufficient documentation

## 2015-09-03 DIAGNOSIS — M1990 Unspecified osteoarthritis, unspecified site: Secondary | ICD-10-CM | POA: Insufficient documentation

## 2015-09-03 DIAGNOSIS — I2109 ST elevation (STEMI) myocardial infarction involving other coronary artery of anterior wall: Secondary | ICD-10-CM | POA: Diagnosis not present

## 2015-09-03 DIAGNOSIS — Z859 Personal history of malignant neoplasm, unspecified: Secondary | ICD-10-CM | POA: Diagnosis not present

## 2015-09-03 DIAGNOSIS — Z043 Encounter for examination and observation following other accident: Secondary | ICD-10-CM | POA: Diagnosis not present

## 2015-09-03 DIAGNOSIS — I10 Essential (primary) hypertension: Secondary | ICD-10-CM | POA: Diagnosis present

## 2015-09-03 DIAGNOSIS — W1839XA Other fall on same level, initial encounter: Secondary | ICD-10-CM | POA: Insufficient documentation

## 2015-09-03 DIAGNOSIS — Y9389 Activity, other specified: Secondary | ICD-10-CM | POA: Diagnosis not present

## 2015-09-03 DIAGNOSIS — W19XXXA Unspecified fall, initial encounter: Secondary | ICD-10-CM

## 2015-09-03 DIAGNOSIS — E039 Hypothyroidism, unspecified: Secondary | ICD-10-CM | POA: Diagnosis not present

## 2015-09-03 DIAGNOSIS — E860 Dehydration: Secondary | ICD-10-CM | POA: Diagnosis not present

## 2015-09-03 DIAGNOSIS — Z79899 Other long term (current) drug therapy: Secondary | ICD-10-CM | POA: Diagnosis not present

## 2015-09-03 DIAGNOSIS — Z8673 Personal history of transient ischemic attack (TIA), and cerebral infarction without residual deficits: Secondary | ICD-10-CM | POA: Insufficient documentation

## 2015-09-03 DIAGNOSIS — E785 Hyperlipidemia, unspecified: Secondary | ICD-10-CM | POA: Diagnosis not present

## 2015-09-03 DIAGNOSIS — Y9289 Other specified places as the place of occurrence of the external cause: Secondary | ICD-10-CM | POA: Insufficient documentation

## 2015-09-03 DIAGNOSIS — N390 Urinary tract infection, site not specified: Secondary | ICD-10-CM | POA: Diagnosis not present

## 2015-09-03 DIAGNOSIS — R404 Transient alteration of awareness: Secondary | ICD-10-CM | POA: Diagnosis not present

## 2015-09-03 DIAGNOSIS — R42 Dizziness and giddiness: Secondary | ICD-10-CM

## 2015-09-03 DIAGNOSIS — R9431 Abnormal electrocardiogram [ECG] [EKG]: Secondary | ICD-10-CM | POA: Diagnosis present

## 2015-09-03 HISTORY — DX: Unspecified osteoarthritis, unspecified site: M19.90

## 2015-09-03 HISTORY — DX: Atherosclerotic heart disease of native coronary artery without angina pectoris: I25.10

## 2015-09-03 LAB — CBC
HEMATOCRIT: 37.1 % (ref 36.0–46.0)
Hemoglobin: 11.9 g/dL — ABNORMAL LOW (ref 12.0–15.0)
MCH: 29.1 pg (ref 26.0–34.0)
MCHC: 32.1 g/dL (ref 30.0–36.0)
MCV: 90.7 fL (ref 78.0–100.0)
Platelets: 193 10*3/uL (ref 150–400)
RBC: 4.09 MIL/uL (ref 3.87–5.11)
RDW: 15.1 % (ref 11.5–15.5)
WBC: 5.3 10*3/uL (ref 4.0–10.5)

## 2015-09-03 LAB — URINALYSIS, ROUTINE W REFLEX MICROSCOPIC
Bilirubin Urine: NEGATIVE
GLUCOSE, UA: NEGATIVE mg/dL
KETONES UR: NEGATIVE mg/dL
LEUKOCYTES UA: NEGATIVE
NITRITE: NEGATIVE
PH: 7 (ref 5.0–8.0)
PROTEIN: NEGATIVE mg/dL
Specific Gravity, Urine: 1.015 (ref 1.005–1.030)

## 2015-09-03 LAB — URINE MICROSCOPIC-ADD ON

## 2015-09-03 LAB — BASIC METABOLIC PANEL
ANION GAP: 9 (ref 5–15)
BUN: 11 mg/dL (ref 6–20)
CHLORIDE: 107 mmol/L (ref 101–111)
CO2: 26 mmol/L (ref 22–32)
Calcium: 9.2 mg/dL (ref 8.9–10.3)
Creatinine, Ser: 0.53 mg/dL (ref 0.44–1.00)
Glucose, Bld: 102 mg/dL — ABNORMAL HIGH (ref 65–99)
POTASSIUM: 3.9 mmol/L (ref 3.5–5.1)
SODIUM: 142 mmol/L (ref 135–145)

## 2015-09-03 MED ORDER — MECLIZINE HCL 25 MG PO TABS
25.0000 mg | ORAL_TABLET | Freq: Once | ORAL | Status: AC
  Administered 2015-09-03: 25 mg via ORAL
  Filled 2015-09-03: qty 1

## 2015-09-03 MED ORDER — SODIUM CHLORIDE 0.9 % IV SOLN
INTRAVENOUS | Status: AC
  Administered 2015-09-03: 23:00:00 via INTRAVENOUS

## 2015-09-03 MED ORDER — SENNA 8.6 MG PO TABS
1.0000 | ORAL_TABLET | Freq: Two times a day (BID) | ORAL | Status: DC
  Administered 2015-09-04 – 2015-09-05 (×3): 8.6 mg via ORAL
  Filled 2015-09-03 (×4): qty 1

## 2015-09-03 MED ORDER — POLYETHYLENE GLYCOL 3350 17 G PO PACK: 17.0000 g | PACK | Freq: Every day | ORAL | Status: DC | PRN

## 2015-09-03 MED ORDER — ACETAMINOPHEN 650 MG RE SUPP: 650.0000 mg | Freq: Four times a day (QID) | RECTAL | Status: DC | PRN

## 2015-09-03 MED ORDER — LEVOTHYROXINE SODIUM 25 MCG PO TABS
25.0000 ug | ORAL_TABLET | Freq: Every day | ORAL | Status: DC
Start: 2015-09-04 — End: 2015-09-05
  Administered 2015-09-04 – 2015-09-05 (×2): 25 ug via ORAL
  Filled 2015-09-03 (×2): qty 1

## 2015-09-03 MED ORDER — ACETAMINOPHEN 325 MG PO TABS
650.0000 mg | ORAL_TABLET | Freq: Four times a day (QID) | ORAL | Status: DC | PRN
  Administered 2015-09-03: 650 mg via ORAL
  Filled 2015-09-03: qty 2

## 2015-09-03 MED ORDER — ATORVASTATIN CALCIUM 10 MG PO TABS
10.0000 mg | ORAL_TABLET | Freq: Every day | ORAL | Status: DC
  Administered 2015-09-04 – 2015-09-05 (×2): 10 mg via ORAL
  Filled 2015-09-03 (×2): qty 1

## 2015-09-03 MED ORDER — SODIUM CHLORIDE 0.9 % IV BOLUS (SEPSIS)
500.0000 mL | Freq: Once | INTRAVENOUS | Status: AC
  Administered 2015-09-03: 500 mL via INTRAVENOUS

## 2015-09-03 MED ORDER — ENOXAPARIN SODIUM 40 MG/0.4ML ~~LOC~~ SOLN
40.0000 mg | SUBCUTANEOUS | Status: DC
  Administered 2015-09-03 – 2015-09-04 (×2): 40 mg via SUBCUTANEOUS
  Filled 2015-09-03 (×2): qty 0.4

## 2015-09-03 MED ORDER — SODIUM CHLORIDE 0.9 % IJ SOLN
3.0000 mL | Freq: Two times a day (BID) | INTRAMUSCULAR | Status: DC
  Administered 2015-09-03 – 2015-09-05 (×4): 3 mL via INTRAVENOUS

## 2015-09-03 MED ORDER — AMLODIPINE BESYLATE 2.5 MG PO TABS
2.5000 mg | ORAL_TABLET | Freq: Every day | ORAL | Status: DC
  Administered 2015-09-04 – 2015-09-05 (×2): 2.5 mg via ORAL
  Filled 2015-09-03 (×2): qty 1

## 2015-09-03 MED ORDER — DEXTROSE 5 % IV SOLN
1.0000 g | INTRAVENOUS | Status: DC
  Administered 2015-09-03 – 2015-09-04 (×2): 1 g via INTRAVENOUS
  Filled 2015-09-03 (×3): qty 10

## 2015-09-03 MED ORDER — HYDROCODONE-ACETAMINOPHEN 5-325 MG PO TABS: 1.0000 | ORAL_TABLET | ORAL | Status: DC | PRN

## 2015-09-03 MED ORDER — METOPROLOL TARTRATE 25 MG PO TABS
25.0000 mg | ORAL_TABLET | Freq: Two times a day (BID) | ORAL | Status: DC
  Administered 2015-09-03 – 2015-09-05 (×4): 25 mg via ORAL
  Filled 2015-09-03 (×4): qty 1

## 2015-09-03 MED ORDER — CLOPIDOGREL BISULFATE 75 MG PO TABS
75.0000 mg | ORAL_TABLET | Freq: Every day | ORAL | Status: DC
  Administered 2015-09-04 – 2015-09-05 (×2): 75 mg via ORAL
  Filled 2015-09-03 (×2): qty 1

## 2015-09-03 MED ORDER — MECLIZINE HCL 25 MG PO TABS
25.0000 mg | ORAL_TABLET | Freq: Three times a day (TID) | ORAL | Status: DC | PRN
  Administered 2015-09-04: 25 mg via ORAL
  Filled 2015-09-03: qty 1

## 2015-09-03 MED ORDER — DOCUSATE SODIUM 100 MG PO CAPS
100.0000 mg | ORAL_CAPSULE | Freq: Two times a day (BID) | ORAL | Status: DC
  Administered 2015-09-04 – 2015-09-05 (×3): 100 mg via ORAL
  Filled 2015-09-03 (×4): qty 1

## 2015-09-03 NOTE — ED Notes (Signed)
Meal tray arrived for patient at this time

## 2015-09-03 NOTE — H&P (Deleted)
Call MD for K <3.5 or >5.0 and Na <125 or > 150

## 2015-09-03 NOTE — ED Notes (Signed)
Dinner tray ordered, heart healthy 

## 2015-09-03 NOTE — Progress Notes (Addendum)
Patient not accepted for admission, by [facility], incomplete work up, no CXR to r/o PTX or rib fractures done as off 6:40 pm after patient presented with fall and syncope , per EDP patient has nosebleeds and is on xarelto , I do not see xarelto on her home med list , per EDP, she has intracranial bleed from a fall, don't see any intracranial bleeding on imaging

## 2015-09-03 NOTE — ED Provider Notes (Signed)
6 PM that a patient after return from MRI scan. She complains of dizziness i.e. sensation of room spinning accompanied by nausea onset this morning. She denies pain anywhere presently. On exam patient is frail.Marland Kitchen Upon ambulation she is unsteady with assistance.. I had lengthy discussion with patient and with her daughter with whom she lives. She's felt to be a fall risk and not safe at home. Spoke with Dr Adela Glimpse . Plan 23 hour observation , telemetry Results for orders placed or performed during the hospital encounter of 09/03/15  Basic metabolic panel  Result Value Ref Range   Sodium 142 135 - 145 mmol/L   Potassium 3.9 3.5 - 5.1 mmol/L   Chloride 107 101 - 111 mmol/L   CO2 26 22 - 32 mmol/L   Glucose, Bld 102 (H) 65 - 99 mg/dL   BUN 11 6 - 20 mg/dL   Creatinine, Ser 1.61 0.44 - 1.00 mg/dL   Calcium 9.2 8.9 - 09.6 mg/dL   GFR calc non Af Amer >60 >60 mL/min   GFR calc Af Amer >60 >60 mL/min   Anion gap 9 5 - 15  CBC  Result Value Ref Range   WBC 5.3 4.0 - 10.5 K/uL   RBC 4.09 3.87 - 5.11 MIL/uL   Hemoglobin 11.9 (L) 12.0 - 15.0 g/dL   HCT 04.5 40.9 - 81.1 %   MCV 90.7 78.0 - 100.0 fL   MCH 29.1 26.0 - 34.0 pg   MCHC 32.1 30.0 - 36.0 g/dL   RDW 91.4 78.2 - 95.6 %   Platelets 193 150 - 400 K/uL  Urinalysis, Routine w reflex microscopic (not at [facility])  Result Value Ref Range   Color, Urine YELLOW YELLOW   APPearance HAZY (A) CLEAR   Specific Gravity, Urine 1.015 1.005 - 1.030   pH 7.0 5.0 - 8.0   Glucose, UA NEGATIVE NEGATIVE mg/dL   Hgb urine dipstick TRACE (A) NEGATIVE   Bilirubin Urine NEGATIVE NEGATIVE   Ketones, ur NEGATIVE NEGATIVE mg/dL   Protein, ur NEGATIVE NEGATIVE mg/dL   Nitrite NEGATIVE NEGATIVE   Leukocytes, UA NEGATIVE NEGATIVE  Urine microscopic-add on  Result Value Ref Range   Squamous Epithelial / LPF 0-5 (A) NONE SEEN   WBC, UA 6-30 0 - 5 WBC/hpf   RBC / HPF 0-5 0 - 5 RBC/hpf   Bacteria, UA MANY (A) NONE SEEN   Ct Head Wo Contrast  09/03/2015  CLINICAL  DATA:  Near syncopal episodes.  Denies fall. EXAM: CT HEAD WITHOUT CONTRAST TECHNIQUE: Contiguous axial images were obtained from the base of the skull through the vertex without intravenous contrast. COMPARISON:  Head CT dated 07/18/2015. FINDINGS: Again noted is generalized brain atrophy with commensurate dilatation of the ventricles and sulci. Ventricles are stable in size and configuration. Confluent areas of chronic small vessel ischemic change again noted throughout the bilateral periventricular and subcortical white matter regions, stable. Again noted is an old infarct within the right parietal-occipital lobe with associated encephalomalacia. There is no mass, hemorrhage, edema or other evidence of acute parenchymal abnormality. No extra-axial hemorrhage. There are chronic calcified atherosclerotic changes of the large vessels at the skull base. No evidence of acute osseous fracture or dislocation. Slight rightward deformity of the nasal bones is chronic. Paranasal sinuses and mastoid air cells are clear. Superficial soft tissues are unremarkable. IMPRESSION: 1. Atrophy and chronic ischemic changes as detailed above. 2. No acute findings. No evidence of acute intracranial abnormality. No intracranial mass, hemorrhage or edema. Electronically Signed  By: Bary [name] M.D.   On: 09/03/2015 15:05   Mr Brain Wo Contrast  09/03/2015  CLINICAL DATA:  Vertigo. Intermittent dizziness and balance issues since early this morning. Recent stroke. EXAM: MRI HEAD WITHOUT CONTRAST TECHNIQUE: Multiplanar, multiecho pulse sequences of the brain and surrounding structures were obtained without intravenous contrast. COMPARISON:  Head CT 09/03/2015 and MRI 03/24/2015 FINDINGS: There is no evidence of acute infarct, mass, midline shift, or extra-axial fluid collection. There is moderate cerebral atrophy. A chronic right MCA territory infarct is again seen involving the parietal lobe. Confluent T2 hyperintensities throughout  the cerebral white matter have not significantly changed from the prior MRI and are compatible with severe chronic small vessel ischemic disease. Numerous chronic microhemorrhages are again seen in the cerebellum. A chronic microhemorrhage is also again seen at the lateral margin of the left thalamus. Chronic lacunar infarcts are noted in the basal ganglia bilaterally and in the pons. Prior bilateral cataract extraction is noted. Paranasal sinuses and mastoid air cells are clear. Leftward nasal septal deviation. Major intracranial vascular flow voids are preserved. 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 IMPRESSION: 1. No acute intracranial abnormality. 2. Chronic right parietal infarct. 3. Severe chronic small vessel ischemic disease. Electronically Signed   By: Sebastian Ache M.D.   On: 09/03/2015 17:23   Dx Vertigo  Doug Sou, MD 09/03/15 1910

## 2015-09-03 NOTE — ED Notes (Signed)
Pt arrives from home via [facility] reporting multiple near syncopal episodes beginning during the night last night.  Pt reports sensation of room spinning around her, denies falls.  EMS reports positive orthostatic VS with BP dropping from 140/80 sitting  to 120/70 standing.  EMS reports pt vomited upon standing.  NAD noted at this time.

## 2015-09-03 NOTE — ED Provider Notes (Signed)
CSN: [id number]     Arrival date & [id number]  1307 History   First MD Initiated Contact with Patient 09/03/15 1311     Chief Complaint  Patient presents with  . Near Syncope     (Consider location/radiation/quality/duration/timing/severity/associated sxs/prior Treatment) HPI Comments: 82 year old female with history of high blood pressure, heart attack, embolic stroke, aspirin use, hyperlipidemia, cognitive deficit poststroke, left-sided neglect stroke presents with intermittent dizziness and balance issues since early this morning. Patient had episode 2 AM and another episode later this morning. Symptoms improved with time patient closed to baseline however gait mildly unsteady compared to normal. Patient did have improved stroke symptoms since her admission to the hospital. Aspirin use. No acute chest pain shortness of breath or new headache.  Patient is a 82 y.o. female presenting with near-syncope. The history is provided by the patient and a relative.  Near Syncope Pertinent negatives include no chest pain, no abdominal pain, no headaches and no shortness of breath.    Past Medical History  Diagnosis Date  . Other specified personal history presenting hazards to health(V15.89)   . Personal history of arthritis   . Unspecified personal history presenting hazards to health   . Cramp of limb   . Acute myocardial infarction of other anterior wall, episode of care unspecified   . Personal history of malignant neoplasm of breast   . Unspecified essential hypertension   . Personal history of other diseases of digestive system   . Unspecified hypothyroidism   . Other and unspecified hyperlipidemia   . Stroke [facility]) 03/2015    "left sided issues"   Past Surgical History  Procedure Laterality Date  . Right thyroid lobectomy    . Cataract extraction      left-2006, right 2011  . Intrathoracic goiter resection    . Cholecystectomy    . Lumpectomy for breast cancer Left 80's     radiation therapy  . Back surgery     Family History  Problem Relation Age of Onset  . Colon cancer Mother   . Coronary artery disease Brother   . Breast cancer Mother    Social History  Substance Use Topics  . Smoking status: Never Smoker   . Smokeless tobacco: Never Used  . Alcohol Use: No   OB History    No data available     Review of Systems  Constitutional: Negative for fever and chills.  HENT: Negative for congestion.   Eyes: Positive for visual disturbance.  Respiratory: Negative for shortness of breath.   Cardiovascular: Positive for near-syncope. Negative for chest pain.  Gastrointestinal: Negative for vomiting and abdominal pain.  Genitourinary: Negative for dysuria (chronic) and flank pain.  Musculoskeletal: Negative for back pain, neck pain and neck stiffness.  Skin: Negative for rash.  Neurological: Positive for dizziness, weakness and light-headedness. Negative for syncope and headaches.  Psychiatric/Behavioral: Positive for agitation.      Allergies  Review of patient's allergies indicates no known allergies.  Home Medications   Prior to Admission medications   Medication Sig Start Date End Date Taking? Authorizing Provider  acetaminophen (TYLENOL) 325 MG tablet Take 2 tablets (650 mg total) by mouth every 4 (four) hours as needed for mild pain. 04/07/15  Yes Evlyn Kanner Love, PA-C  amLODipine (NORVASC) 2.5 MG tablet Take 2.5 mg by mouth daily.   Yes Historical Provider, MD  atorvastatin (LIPITOR) 10 MG tablet Take 10 mg by mouth daily.   Yes Historical Provider, MD  cholecalciferol (VITAMIN D)  400 UNITS TABS tablet Take 400 Units by mouth daily.   Yes Historical Provider, MD  levothyroxine (SYNTHROID, LEVOTHROID) 25 MCG tablet Take 1 tablet by mouth  daily 12/25/14  Yes Aleksei Plotnikov V, MD  metoprolol (LOPRESSOR) 50 MG tablet Take 1 tablet by mouth two  times daily 12/25/14  Yes Aleksei Plotnikov V, MD  vitamin B-12 (CYANOCOBALAMIN) 1000 MCG tablet Take  1,000 mcg by mouth 2 (two) times daily.    Yes Historical Provider, MD  clopidogrel (PLAVIX) 75 MG tablet Take 1 tablet (75 mg total) by mouth daily. 04/07/15   Evlyn Kanner Love, PA-C   BP 193/73 mmHg  Pulse 56  Temp(Src) 97.7 F (36.5 C) (Oral)  Resp 23  SpO2 98% Physical Exam  Constitutional: She appears well-developed and well-nourished.  HENT:  Head: Normocephalic and atraumatic.  Eyes: Right eye exhibits no discharge. Left eye exhibits no discharge.  Neck: Normal range of motion. Neck supple. No tracheal deviation present.  Cardiovascular: Normal rate and regular rhythm.   Pulmonary/Chest: Effort normal and breath sounds normal.  Abdominal: Soft. She exhibits no distension. There is no tenderness. There is no guarding.  Musculoskeletal: She exhibits no edema.  Neurological: She is alert.  Mild slow to respond, difficulty hearing. Patient moves all extremities fairly equal bilateral mild weakness in the left side chronic. Sensation decreased left leg versus the right leg. Neck supple. Finger nose intact. Mild stag S left horizontal gaze on the left eye. Minimal vision right eye chronic.  Skin: Skin is warm. No rash noted.  Psychiatric:  Pleasant, alert and oriented, difficulty hearing.  Nursing note and vitals reviewed.   ED Course  Procedures (including critical care time) Labs Review Labs Reviewed  BASIC METABOLIC PANEL - Abnormal; Notable for the following:    Glucose, Bld 102 (*)    All other components within normal limits  CBC - Abnormal; Notable for the following:    Hemoglobin 11.9 (*)    All other components within normal limits  URINALYSIS, ROUTINE W REFLEX MICROSCOPIC (NOT AT [facility])  CBG MONITORING, ED    Imaging Review No results found. I have personally reviewed and evaluated these images and lab results as part of my medical decision-making.   EKG Interpretation   Date/Time:  Wednesday September 03 2015 13:14:02 EST Ventricular Rate:  58 PR Interval:   160 QRS Duration: 92 QT Interval:  516 QTC Calculation: 507 R Axis:   57 Text Interpretation:  Sinus rhythm Nonspecific T abnormalities, lateral  leads Prolonged QT interval Confirmed by Brighton Pilley  MD, Eppie Barhorst ([id number]) on  09/03/2015 1:42:42 PM      MDM   Final diagnoses:  Vertigo  Fall, initial encounter   Patient presents with dizziness/lightheadedness and worsening gait. Concern with posterior stroke. CT scan no acute finding, MRI pending. Urinalysis pending. Blood work screen unremarkable. Patient needs significant assistance with ambulation. Patient's care signed out to reassess and follow-up MRI results with possible neuro consult.  Filed Vitals:   09/03/15 1318 09/03/15 1538  BP: 193/73 172/79  Pulse: 56 60  Temp: 97.7 F (36.5 C)   Resp: 23 21      Blane Ohara, MD 09/03/15 [phone]

## 2015-09-03 NOTE — H&P (Signed)
PCP:  Ginette Otto, MD  [facility]  Referring provider Jacubowitz   Chief Complaint:  vertigo  HPI: Leah Figueroa is a 82 y.o. female   has a past medical history of Other specified personal history presenting hazards to health(V15.89); Personal history of arthritis; Unspecified personal history presenting hazards to health; Cramp of limb; Acute myocardial infarction of other anterior wall, episode of care unspecified; Personal history of malignant neoplasm of breast; Unspecified essential hypertension; Personal history of other diseases of digestive system; Unspecified hypothyroidism; Other and unspecified hyperlipidemia; and Stroke (HCC) (03/2015).   Presented with    Patient has a history of right MCA ischemic and right pontine stroke in August 2016 requiring IV TPA she's been followed by rehabilitation and has outpatient PT and OT. Follow her stroke patient have had left-sided neglect and cognitive deficits.  At that time carotid Doppler showed no significant stenosis 2 g showed no evidence of cardioembolic source of emboli hemoglobin A1c analysis 5.8 patient has been switched to Plavix she was originally discharged to [facility] place but then was transferred to home  Since end of September.  Family was a bit confused about Plavix but states she has been taking it.  Patient presented with   the room spinning sensation and inability to walk. she has not fallen. No slurred speach. She has Left side deficit which is chronic.On arrival of EMS she was orthostatic per their report and vomited when she stood up. Family reports foul smelling urine no dysuria.  An emerge department patient had CT scan of the head which was unremarkable. MRI show no acute intracranial abnormality with evidence of chronic right parietal infarct and CVA chronic small vessel ischemic disease.  Hospitalist was called for admission for vertigo, TIA versus dehydration  Review of Systems:    Pertinent  positives include: Vertigo and presyncope, ataxia  Constitutional:  No weight loss, night sweats, Fevers, chills, fatigue, weight loss  HEENT:  No headaches, Difficulty swallowing,Tooth/dental problems,Sore throat,  No sneezing, itching, ear ache, nasal congestion, post nasal drip,  Cardio-vascular:  No chest pain, Orthopnea, PND, anasarca, dizziness .no Bilateral lower extremity swelling  GI:  No heartburn, indigestion, abdominal pain, nausea, vomiting, diarrhea, change in bowel habits, loss of appetite, melena, blood in stool, hematemesis Resp:  no shortness of breath at rest. No dyspnea on exertion, No excess mucus, no productive cough, No non-productive cough, No coughing up of blood.No change in color of mucus.No wheezing. Skin:  no rash or lesions. No jaundice GU:  no dysuria, change in color of urine, no urgency or frequency. No straining to urinate.  No flank pain.  Musculoskeletal:  No joint pain or no joint swelling. No decreased range of motion. No back pain.  Psych:  No change in mood or affect. No depression or anxiety. No memory loss.  Neuro: no localizing neurological complaints, no tingling, no weakness, no double vision, no gait abnormality, no slurred speech, no confusion  Otherwise ROS are negative except for above, 10 systems were reviewed  Past Medical History: Past Medical History  Diagnosis Date  . Other specified personal history presenting hazards to health(V15.89)   . Personal history of arthritis   . Unspecified personal history presenting hazards to health   . Cramp of limb   . Acute myocardial infarction of other anterior wall, episode of care unspecified   . Personal history of malignant neoplasm of breast   . Unspecified essential hypertension   . Personal history of other diseases of  digestive system   . Unspecified hypothyroidism   . Other and unspecified hyperlipidemia   . Stroke [facility]) 03/2015    "left sided issues"   Past Surgical History    Procedure Laterality Date  . Right thyroid lobectomy    . Cataract extraction      left-2006, right 2011  . Intrathoracic goiter resection    . Cholecystectomy    . Lumpectomy for breast cancer Left 80's    radiation therapy  . Back surgery       Medications: Prior to Admission medications   Medication Sig Start Date End Date Taking? Authorizing Provider  acetaminophen (TYLENOL) 325 MG tablet Take 2 tablets (650 mg total) by mouth every 4 (four) hours as needed for mild pain. 04/07/15  Yes Evlyn Kanner Love, PA-C  amLODipine (NORVASC) 2.5 MG tablet Take 2.5 mg by mouth daily.   Yes Historical Provider, MD  atorvastatin (LIPITOR) 10 MG tablet Take 10 mg by mouth daily.   Yes Historical Provider, MD  cholecalciferol (VITAMIN D) 400 UNITS TABS tablet Take 400 Units by mouth daily.   Yes Historical Provider, MD  levothyroxine (SYNTHROID, LEVOTHROID) 25 MCG tablet Take 1 tablet by mouth  daily 12/25/14  Yes Aleksei Plotnikov V, MD  metoprolol (LOPRESSOR) 50 MG tablet Take 1 tablet by mouth two  times daily 12/25/14  Yes Aleksei Plotnikov V, MD  vitamin B-12 (CYANOCOBALAMIN) 1000 MCG tablet Take 1,000 mcg by mouth 2 (two) times daily.    Yes Historical Provider, MD  clopidogrel (PLAVIX) 75 MG tablet Take 1 tablet (75 mg total) by mouth daily. Patient not taking: Reported on 09/03/2015 04/07/15   Jacquelynn Cree, PA-C    Allergies:  No Known Allergies  Social History:  Ambulatory with walker   Lives at home   With family     reports that she has never smoked. She has never used smokeless tobacco. She reports that she does not drink alcohol or use illicit drugs.     Family History: family history includes Breast cancer in her mother; Colon cancer in her mother; Coronary artery disease in her brother.    Physical Exam: Patient Vitals for the past 24 hrs:  BP Temp Temp src Pulse Resp SpO2  09/03/15 1830 186/73 mmHg - - 60 16 98 %  09/03/15 1822 157/63 mmHg - - 69 16 97 %  09/03/15 1821 -  97.7 F (36.5 C) - - - -  09/03/15 1600 171/70 mmHg - - (!) 56 19 96 %  09/03/15 1538 172/79 mmHg - - 60 21 97 %  09/03/15 1318 193/73 mmHg 97.7 F (36.5 C) Oral (!) 56 23 98 %  09/03/15 1309 - - - - - 98 %    1. General:  in No Acute distress 2. Psychological: Alert  Oriented to [place] , situation and time 3. Head/ENT:   Dry Mucous Membranes                          Head Non traumatic, neck supple                           Poor Dentition 4. SKIN:   decreased Skin turgor,  Skin clean Dry and intact no rash 5. Heart: Regular rate and rhythm no Murmur, Rub or gallop 6. Lungs: Clear to auscultation bilaterally, no wheezes or crackles   7. Abdomen: Soft, non-tender, Non distended 8. Lower extremities:  no clubbing, cyanosis, or edema 9. Neurologically Chromic left side weakness. 10. MSK: Normal range of motion  body mass index is unknown because there is no weight on file.   Labs on Admission:   Results for orders placed or performed during the hospital encounter of 09/03/15 (from the past 24 hour(s))  Basic metabolic panel     Status: Abnormal   Collection Time: 09/03/15  1:17 PM  Result Value Ref Range   Sodium 142 135 - 145 mmol/L   Potassium 3.9 3.5 - 5.1 mmol/L   Chloride 107 101 - 111 mmol/L   CO2 26 22 - 32 mmol/L   Glucose, Bld 102 (H) 65 - 99 mg/dL   BUN 11 6 - 20 mg/dL   Creatinine, Ser 4.09 0.44 - 1.00 mg/dL   Calcium 9.2 8.9 - 81.1 mg/dL   GFR calc non Af Amer >60 >60 mL/min   GFR calc Af Amer >60 >60 mL/min   Anion gap 9 5 - 15  CBC     Status: Abnormal   Collection Time: 09/03/15  1:17 PM  Result Value Ref Range   WBC 5.3 4.0 - 10.5 K/uL   RBC 4.09 3.87 - 5.11 MIL/uL   Hemoglobin 11.9 (L) 12.0 - 15.0 g/dL   HCT 91.4 78.2 - 95.6 %   MCV 90.7 78.0 - 100.0 fL   MCH 29.1 26.0 - 34.0 pg   MCHC 32.1 30.0 - 36.0 g/dL   RDW 21.3 08.6 - 57.8 %   Platelets 193 150 - 400 K/uL  Urinalysis, Routine w reflex microscopic (not at [facility])     Status: Abnormal   Collection  Time: 09/03/15  3:40 PM  Result Value Ref Range   Color, Urine YELLOW YELLOW   APPearance HAZY (A) CLEAR   Specific Gravity, Urine 1.015 1.005 - 1.030   pH 7.0 5.0 - 8.0   Glucose, UA NEGATIVE NEGATIVE mg/dL   Hgb urine dipstick TRACE (A) NEGATIVE   Bilirubin Urine NEGATIVE NEGATIVE   Ketones, ur NEGATIVE NEGATIVE mg/dL   Protein, ur NEGATIVE NEGATIVE mg/dL   Nitrite NEGATIVE NEGATIVE   Leukocytes, UA NEGATIVE NEGATIVE  Urine microscopic-add on     Status: Abnormal   Collection Time: 09/03/15  3:40 PM  Result Value Ref Range   Squamous Epithelial / LPF 0-5 (A) NONE SEEN   WBC, UA 6-30 0 - 5 WBC/hpf   RBC / HPF 0-5 0 - 5 RBC/hpf   Bacteria, UA MANY (A) NONE SEEN    UA 3-30 white blood cells many bacteria urine culture pending  Lab Results  Component Value Date   HGBA1C 5.8* 03/23/2015    CrCl cannot be calculated (Unknown ideal weight.).  BNP (last 3 results) No results for input(s): PROBNP in the last 8760 hours.  Other results:  I have pearsonaly reviewed this: ECG REPORT  Rate: 58  Rhythm: Sinus rhythm ST&T Change: Nonspecific ST segment changes QTc 509  There were no vitals filed for this visit.   Cultures: No results found for: SDES, SPECREQUEST, CULT, REPTSTATUS   Radiological Exams on Admission: Ct Head Wo Contrast  09/03/2015  CLINICAL DATA:  Near syncopal episodes.  Denies fall. EXAM: CT HEAD WITHOUT CONTRAST TECHNIQUE: Contiguous axial images were obtained from the base of the skull through the vertex without intravenous contrast. COMPARISON:  Head CT dated 07/18/2015. FINDINGS: Again noted is generalized brain atrophy with commensurate dilatation of the ventricles and sulci. Ventricles are stable in size and configuration. Confluent areas of  chronic small vessel ischemic change again noted throughout the bilateral periventricular and subcortical white matter regions, stable. Again noted is an old infarct within the right parietal-occipital lobe with  associated encephalomalacia. There is no mass, hemorrhage, edema or other evidence of acute parenchymal abnormality. No extra-axial hemorrhage. There are chronic calcified atherosclerotic changes of the large vessels at the skull base. No evidence of acute osseous fracture or dislocation. Slight rightward deformity of the nasal bones is chronic. Paranasal sinuses and mastoid air cells are clear. Superficial soft tissues are unremarkable. IMPRESSION: 1. Atrophy and chronic ischemic changes as detailed above. 2. No acute findings. No evidence of acute intracranial abnormality. No intracranial mass, hemorrhage or edema. Electronically Signed   By: Bary [name] M.D.   On: 09/03/2015 15:05   Mr Brain Wo Contrast  09/03/2015  CLINICAL DATA:  Vertigo. Intermittent dizziness and balance issues since early this morning. Recent stroke. EXAM: MRI HEAD WITHOUT CONTRAST TECHNIQUE: Multiplanar, multiecho pulse sequences of the brain and surrounding structures were obtained without intravenous contrast. COMPARISON:  Head CT 09/03/2015 and MRI 03/24/2015 FINDINGS: There is no evidence of acute infarct, mass, midline shift, or extra-axial fluid collection. There is moderate cerebral atrophy. A chronic right MCA territory infarct is again seen involving the parietal lobe. Confluent T2 hyperintensities throughout the cerebral white matter have not significantly changed from the prior MRI and are compatible with severe chronic small vessel ischemic disease. Numerous chronic microhemorrhages are again seen in the cerebellum. A chronic microhemorrhage is also again seen at the lateral margin of the left thalamus. Chronic lacunar infarcts are noted in the basal ganglia bilaterally and in the pons. Prior bilateral cataract extraction is noted. Paranasal sinuses and mastoid air cells are clear. Leftward nasal septal deviation. Major intracranial vascular flow voids are preserved. 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  IMPRESSION: 1. No acute intracranial abnormality. 2. Chronic right parietal infarct. 3. Severe chronic small vessel ischemic disease. Electronically Signed   By: Sebastian Ache M.D.   On: 09/03/2015 17:23    Chart has been reviewed  Family  at  Bedside  plan of care was discussed with  Daughter Marcia Brash [phone]  Assessment/Plan  82 year old female with history of stroke in August 2016 with residual left-sided weakness and negative deficits presents with onset of vertigo resulting in ataxia and inability to walk no otherwise neurological deficits discussed with neurology who felt that this point no indication for TIA workup she was found to have urinary tract infection and apparently fear to be dehydrated and admitted for rehydration  Present on Admission:  . Essential hypertension - continue home medications decreased dose of metoprolol given slight bradycardia light on telemetry  . Dehydration administer IV fluids  . UTI (lower urinary tract infection) - treat with Rocephin await results of urine culture  . Prolonged Q-T interval on ECG - I'll monitor on telemetry rehydrate and recheck in a.m.   Prophylaxis: SCD   CODE STATUS:    DNR/DNI as per patient    Disposition:  likely will need placement for rehabilitation                        Other plan as per orders.  I have spent a total of  66 min on this admission case was discussed with Dr. Roseanne Reno with   neurology  Arra Connaughton 09/03/2015, 10:26 PM    [facility]  Pager [phone]   after 2 AM please page floor coverage PA If 7AM-7PM,  please contact the day team taking care of the patient  [email or web address]  Password [email or web address]

## 2015-09-04 ENCOUNTER — Ambulatory Visit: Payer: Medicare Other | Admitting: Physical Therapy

## 2015-09-04 ENCOUNTER — Ambulatory Visit: Payer: Medicare Other | Admitting: Occupational Therapy

## 2015-09-04 ENCOUNTER — Encounter (HOSPITAL_COMMUNITY): Payer: Self-pay | Admitting: General Practice

## 2015-09-04 DIAGNOSIS — I2109 ST elevation (STEMI) myocardial infarction involving other coronary artery of anterior wall: Secondary | ICD-10-CM | POA: Diagnosis not present

## 2015-09-04 DIAGNOSIS — I4581 Long QT syndrome: Secondary | ICD-10-CM | POA: Diagnosis not present

## 2015-09-04 DIAGNOSIS — R55 Syncope and collapse: Secondary | ICD-10-CM | POA: Diagnosis not present

## 2015-09-04 DIAGNOSIS — N390 Urinary tract infection, site not specified: Secondary | ICD-10-CM | POA: Diagnosis not present

## 2015-09-04 DIAGNOSIS — R42 Dizziness and giddiness: Secondary | ICD-10-CM | POA: Diagnosis not present

## 2015-09-04 DIAGNOSIS — E860 Dehydration: Secondary | ICD-10-CM | POA: Diagnosis not present

## 2015-09-04 DIAGNOSIS — I10 Essential (primary) hypertension: Secondary | ICD-10-CM | POA: Diagnosis not present

## 2015-09-04 LAB — COMPREHENSIVE METABOLIC PANEL
ALT: 14 U/L (ref 14–54)
ANION GAP: 6 (ref 5–15)
AST: 18 U/L (ref 15–41)
Albumin: 3.3 g/dL — ABNORMAL LOW (ref 3.5–5.0)
Alkaline Phosphatase: 64 U/L (ref 38–126)
BILIRUBIN TOTAL: 0.8 mg/dL (ref 0.3–1.2)
BUN: 8 mg/dL (ref 6–20)
CHLORIDE: 108 mmol/L (ref 101–111)
CO2: 28 mmol/L (ref 22–32)
Calcium: 9.3 mg/dL (ref 8.9–10.3)
Creatinine, Ser: 0.59 mg/dL (ref 0.44–1.00)
Glucose, Bld: 107 mg/dL — ABNORMAL HIGH (ref 65–99)
POTASSIUM: 4.1 mmol/L (ref 3.5–5.1)
Sodium: 142 mmol/L (ref 135–145)
TOTAL PROTEIN: 6.8 g/dL (ref 6.5–8.1)

## 2015-09-04 LAB — CBC
HEMATOCRIT: 37.2 % (ref 36.0–46.0)
HEMOGLOBIN: 12 g/dL (ref 12.0–15.0)
MCH: 28.7 pg (ref 26.0–34.0)
MCHC: 32.3 g/dL (ref 30.0–36.0)
MCV: 89 fL (ref 78.0–100.0)
Platelets: 195 10*3/uL (ref 150–400)
RBC: 4.18 MIL/uL (ref 3.87–5.11)
RDW: 14.8 % (ref 11.5–15.5)
WBC: 6.6 10*3/uL (ref 4.0–10.5)

## 2015-09-04 LAB — MAGNESIUM: MAGNESIUM: 1.8 mg/dL (ref 1.7–2.4)

## 2015-09-04 LAB — GLUCOSE, CAPILLARY: Glucose-Capillary: 104 mg/dL — ABNORMAL HIGH (ref 65–99)

## 2015-09-04 LAB — PHOSPHORUS: PHOSPHORUS: 3.6 mg/dL (ref 2.5–4.6)

## 2015-09-04 LAB — TSH: TSH: 0.757 u[IU]/mL (ref 0.350–4.500)

## 2015-09-04 MED ORDER — SODIUM CHLORIDE 0.9 % IV SOLN
INTRAVENOUS | Status: DC
  Administered 2015-09-05: 07:00:00 via INTRAVENOUS

## 2015-09-04 MED ORDER — ALPRAZOLAM 0.25 MG PO TABS
0.2500 mg | ORAL_TABLET | Freq: Once | ORAL | Status: AC
  Administered 2015-09-04: 0.25 mg via ORAL
  Filled 2015-09-04: qty 1

## 2015-09-04 NOTE — Care Management Obs Status (Signed)
MEDICARE OBSERVATION STATUS NOTIFICATION   Patient Details  Name: Leah Figueroa MRN: [id number] Date of Birth: [birth year]   Medicare Observation Status Notification Given:  Yes    Gala Lewandowsky, RN 09/04/2015, 3:56 PM

## 2015-09-04 NOTE — Evaluation (Signed)
Physical Therapy Evaluation Patient Details Name: KHRISTINE VERNO MRN: [id number] DOB: Jan 22, 1923 Today's Date: 09/04/2015   History of Present Illness  Pt admitted with dizziness/vertigo, dehydration and UTI. PMH: CVA 8/16, arthritis, MI, breast ca, HTN, HLD. Pt has been participating in OPOT and PT.  Clinical Impression  Patient presents with decreased mobility due to imbalance and possible L vestibular hypofunction.  She will benefit from skilled PT in the acute setting to allow return home with family assist.  Recommend resuming outpatient PT at neurorehab center for continued vestibular rehab.    Follow Up Recommendations Outpatient PT (resume at neuro outpatient)    Equipment Recommendations  None recommended by PT    Recommendations for Other Services       Precautions / Restrictions Precautions Precautions: Fall      Mobility  Bed Mobility         Supine to sit: HOB elevated;Supervision Sit to supine: Min guard   General bed mobility comments: sitting up to EOB upon my entry with HOB elevated approx 70 degrees; daughter in the room; to supine assist for positioning and feet.  Transfers Overall transfer level: Needs assistance Equipment used: Rolling walker (2 wheeled) Transfers: Sit to/from Stand Sit to Stand: Min guard         General transfer comment: assist for balance/safety  Ambulation/Gait Ambulation/Gait assistance: Min guard Ambulation Distance (Feet): 150 Feet Assistive device: Rolling walker (2 wheeled) Gait Pattern/deviations: Step-through pattern;Shuffle;Decreased stride length     General Gait Details: some instability when turning head to look at me when talking and walking;  Patient c/o some nausea after ambulation  Stairs            Wheelchair Mobility    Modified Rankin (Stroke Patients Only)       Balance Overall balance assessment: Needs assistance   Sitting balance-[name] Scale: Good     Standing balance support:  Bilateral upper extremity supported Standing balance-[name] Scale: Poor Standing balance comment: needs UE support for static balance and ambulation                             Pertinent Vitals/Pain Pain Assessment: No/denies pain    Home Living Family/patient expects to be discharged to:: Private residence Living Arrangements: Children Available Help at Discharge: Family;Available 24 hours/day Type of Home: House Home Access: Stairs to enter Entrance Stairs-Rails: None Entrance Stairs-Number of Steps: 1 step to porch Home Layout: One level Home Equipment: Environmental consultant - 2 wheels;Shower seat;Hand held shower head;Bedside commode      Prior Function Level of Independence: Needs assistance   Gait / Transfers Assistance Needed: ambulates with a RW, hx of falls  ADL's / Homemaking Assistance Needed: min assist for bathing, dressing (UB), and setting up meals, supervised for toileting and grooming, daughter performs all IADL  Comments: pt has lived with her daughter since her stroke     Hand Dominance        Extremity/Trunk Assessment   Upper Extremity Assessment: Defer to OT evaluation           Lower Extremity Assessment: RLE deficits/detail;LLE deficits/detail RLE Deficits / Details: AROM WFL, strength 4+/5 LLE Deficits / Details: AROM WFL, strength 4/5     Communication   Communication: HOH  Cognition Arousal/Alertness: Awake/alert Behavior During Therapy: WFL for tasks assessed/performed Overall Cognitive Status: History of cognitive impairments - at baseline  General Comments      Exercises        Assessment/Plan    PT Assessment Patient needs continued PT services  PT Diagnosis Abnormality of gait;Other (comment) (vertigo)   PT Problem List Decreased activity tolerance;Decreased balance;Decreased mobility;Other (comment) (vertigo)  PT Treatment Interventions DME instruction;Gait training;Balance training;Functional  mobility training;Therapeutic activities;Therapeutic exercise;Patient/family education;Other (comment) (vestibular rehab)   PT Goals (Current goals can be found in the Care Plan section) Acute Rehab PT Goals Patient Stated Goal: feel better PT Goal Formulation: With patient/family Time For Goal Achievement: 09/11/15 Potential to Achieve Goals: Good    Frequency Min 3X/week   Barriers to discharge        Co-evaluation               End of Session Equipment Utilized During Treatment: Gait belt Activity Tolerance: Other (comment) (limited by nausea) Patient left: in bed;with call bell/phone within reach;with bed alarm set;with family/visitor present      Functional Assessment Tool Used: Clinical Judgement Functional Limitation: Mobility: Walking and moving around Mobility: Walking and Moving Around Current Status (Z6109): At least 40 percent but less than 60 percent impaired, limited or restricted Mobility: Walking and Moving Around Goal Status [phone]): At least 40 percent but less than 60 percent impaired, limited or restricted Mobility: Walking and Moving Around Discharge Status [phone]): At least 40 percent but less than 60 percent impaired, limited or restricted    Time: 1335-1400 PT Time Calculation (min) (ACUTE ONLY): 25 min   Charges:   PT Evaluation $PT Eval High Complexity: 1 Procedure PT Treatments $Gait Training: 8-22 mins   PT G Codes:   PT G-Codes **NOT FOR INPATIENT CLASS** Functional Assessment Tool Used: Clinical Judgement Functional Limitation: Mobility: Walking and moving around Mobility: Walking and Moving Around Current Status (B1478): At least 40 percent but less than 60 percent impaired, limited or restricted Mobility: Walking and Moving Around Goal Status [phone]): At least 40 percent but less than 60 percent impaired, limited or restricted Mobility: Walking and Moving Around Discharge Status [phone]): At least 40 percent but less than 60 percent  impaired, limited or restricted    Elray Mcgregor 09/04/2015, 3:12 PM  Sheran Lawless, [place] [phone] 09/04/2015

## 2015-09-04 NOTE — Progress Notes (Signed)
[facility] PROGRESS NOTE  Leah Figueroa ZOX:[id number] DOB: 11-27-1922 DOA: 09/03/2015 PCP: Ginette Otto, MD  Assessment/Plan: 1. Orthostatic hypotension/ dehydration/ dizziness: Admitted to telemetry. Hydrate and rpeat orthostatics are negative.  Decreased the dose of metoprolol for bradycardia.    2. Abnormal UA: URINE cultures ordered and pending.  Meanwhile pt on rocephin.    3. Prolonged qt interval: Improved.   4. Hypothyroidism: TSH within normal limits. Resume synthroid.     Code Status: DNR Family Communication: none at bedside Disposition Plan: pending PT EVAL.   Consultants:  none  Procedures:  MRI brain.  Antibiotics:  none  HPI/Subjective: Reports feeling dizzy on standing   Objective: Filed Vitals:   09/04/15 0904 09/04/15 1155  BP:  150/63  Pulse:  54  Temp:  98.2 F (36.8 C)  Resp: 18 18    Intake/Output Summary (Last 24 hours) at 09/04/15 1212 Last data filed at 09/04/15 1100  Gross per 24 hour  Intake 1042.5 ml  Output    200 ml  Net  842.5 ml   Filed Weights   09/03/15 2140 09/04/15 0500  Weight: 48.127 kg (106 lb 1.6 oz) 48.308 kg (106 lb 8 oz)    Exam:   General:  Alert afebrile comfortable.   Cardiovascular: s1s2  Respiratory: ctab, no wheezing or rhonchi  Abdomen: soft non tender non distended bowel sounds heard  Musculoskeletal: trace pedal edema.   Data Reviewed: Basic Metabolic Panel:  Recent Labs Lab 09/03/15 1317 09/04/15 0349  NA 142 142  K 3.9 4.1  CL 107 108  CO2 26 28  GLUCOSE 102* 107*  BUN 11 8  CREATININE 0.53 0.59  CALCIUM 9.2 9.3  MG  --  1.8  PHOS  --  3.6   Liver Function Tests:  Recent Labs Lab 09/04/15 0349  AST 18  ALT 14  ALKPHOS 64  BILITOT 0.8  PROT 6.8  ALBUMIN 3.3*   No results for input(s): LIPASE, AMYLASE in the last 168 hours. No results for input(s): AMMONIA in the last 168 hours. CBC:  Recent Labs Lab 09/03/15 1317 09/04/15 0349  WBC 5.3  6.6  HGB 11.9* 12.0  HCT 37.1 37.2  MCV 90.7 89.0  PLT 193 195   Cardiac Enzymes: No results for input(s): CKTOTAL, CKMB, CKMBINDEX, TROPONINI in the last 168 hours. BNP (last 3 results) No results for input(s): BNP in the last 8760 hours.  ProBNP (last 3 results) No results for input(s): PROBNP in the last 8760 hours.  CBG:  Recent Labs Lab 09/03/15 1816  GLUCAP 104*    Recent Results (from the past 240 hour(s))  Urine culture     Status: None (Preliminary result)   Collection Time: 09/03/15  3:40 PM  Result Value Ref Range Status   Specimen Description URINE, RANDOM  Final   Special Requests Normal  Final   Culture   Final    >=100,000 COLONIES/mL GRAM NEGATIVE RODS CULTURE REINCUBATED FOR BETTER GROWTH    Report Status PENDING  Incomplete     Studies: Ct Head Wo Contrast  09/03/2015  CLINICAL DATA:  Near syncopal episodes.  Denies fall. EXAM: CT HEAD WITHOUT CONTRAST TECHNIQUE: Contiguous axial images were obtained from the base of the skull through the vertex without intravenous contrast. COMPARISON:  Head CT dated 07/18/2015. FINDINGS: Again noted is generalized brain atrophy with commensurate dilatation of the ventricles and sulci. Ventricles are stable in size and configuration. Confluent areas of chronic small vessel ischemic change again noted throughout  the bilateral periventricular and subcortical white matter regions, stable. Again noted is an old infarct within the right parietal-occipital lobe with associated encephalomalacia. There is no mass, hemorrhage, edema or other evidence of acute parenchymal abnormality. No extra-axial hemorrhage. There are chronic calcified atherosclerotic changes of the large vessels at the skull base. No evidence of acute osseous fracture or dislocation. Slight rightward deformity of the nasal bones is chronic. Paranasal sinuses and mastoid air cells are clear. Superficial soft tissues are unremarkable. IMPRESSION: 1. Atrophy and chronic  ischemic changes as detailed above. 2. No acute findings. No evidence of acute intracranial abnormality. No intracranial mass, hemorrhage or edema. Electronically Signed   By: Bary [name] M.D.   On: 09/03/2015 15:05   Mr Brain Wo Contrast  09/03/2015  CLINICAL DATA:  Vertigo. Intermittent dizziness and balance issues since early this morning. Recent stroke. EXAM: MRI HEAD WITHOUT CONTRAST TECHNIQUE: Multiplanar, multiecho pulse sequences of the brain and surrounding structures were obtained without intravenous contrast. COMPARISON:  Head CT 09/03/2015 and MRI 03/24/2015 FINDINGS: There is no evidence of acute infarct, mass, midline shift, or extra-axial fluid collection. There is moderate cerebral atrophy. A chronic right MCA territory infarct is again seen involving the parietal lobe. Confluent T2 hyperintensities throughout the cerebral white matter have not significantly changed from the prior MRI and are compatible with severe chronic small vessel ischemic disease. Numerous chronic microhemorrhages are again seen in the cerebellum. A chronic microhemorrhage is also again seen at the lateral margin of the left thalamus. Chronic lacunar infarcts are noted in the basal ganglia bilaterally and in the pons. Prior bilateral cataract extraction is noted. Paranasal sinuses and mastoid air cells are clear. Leftward nasal septal deviation. Major intracranial vascular flow voids are preserved. 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 IMPRESSION: 1. No acute intracranial abnormality. 2. Chronic right parietal infarct. 3. Severe chronic small vessel ischemic disease. Electronically Signed   By: Sebastian Ache M.D.   On: 09/03/2015 17:23    Scheduled Meds: . amLODipine  2.5 mg Oral Daily  . atorvastatin  10 mg Oral Daily  . cefTRIAXone (ROCEPHIN) IVPB 1 gram/50 mL D5W  1 g Intravenous Q24H  . clopidogrel  75 mg Oral Daily  . docusate sodium  100 mg Oral BID  . enoxaparin (LOVENOX) injection  40 mg  Subcutaneous Q24H  . levothyroxine  25 mcg Oral QAC breakfast  . metoprolol  25 mg Oral BID  . senna  1 tablet Oral BID  . sodium chloride  3 mL Intravenous Q12H   Continuous Infusions: . sodium chloride 75 mL/hr at 09/04/15 0930    Active Problems:   Essential hypertension   Vertigo   Dehydration   UTI (lower urinary tract infection)   Prolonged Q-T interval on ECG    Time spent: 25 MINUTES    [facility]  [facility] Pager [phone]  If 7PM-7AM, please contact night-coverage at [email or web address], password [facility] 09/04/2015, 12:12 PM

## 2015-09-04 NOTE — Evaluation (Signed)
Occupational Therapy Evaluation Patient Details Name: Leah Figueroa MRN: [id number] DOB: 11/29/1922 Today's Date: 09/04/2015    History of Present Illness Pt admitted with dizziness/vertigo, dehydration and UTI. PMH: CVA 8/16, arthritis, MI, breast ca, HTN, HLD. Pt has been participating in OPOT and PT.   Clinical Impression   Pt was ambulating with a walker and assisted for showering, UB dressing, setting up for feeding and supervised for standing grooming. Pt has baseline impaired cognition with poor memory, decreased safety awareness and awareness of deficits.  She reports a feeling of her heaviness in her head with bed mobility, sitting and standing with poor balance and nausea once returned to supine.  Awaiting vestibular evaluation. Will follow acutely.    Follow Up Recommendations  Outpatient OT;Supervision/Assistance - 24 hour (resume neuro OPOT)   Equipment Recommendations  None recommended by OT    Recommendations for Other Services       Precautions / Restrictions Precautions Precautions: Fall      Mobility Bed Mobility Overal bed mobility: Needs Assistance Bed Mobility: Supine to Sit;Sit to Supine     Supine to sit: Min assist Sit to supine: Min guard      Transfers                      Balance Overall balance assessment: Needs assistance Sitting-balance support: Feet supported Sitting balance-[name] Scale: Fair Sitting balance - Comments: poor initially with pt reporting her head feeling heavy     Standing balance-[name] Scale: Poor                              ADL Overall ADL's : Needs assistance/impaired Eating/Feeding: Set up;Bed level   Grooming: Wash/dry hands;Wash/dry face;Min guard;Sitting   Upper Body Bathing: Sitting;Minimal assitance   Lower Body Bathing: Moderate assistance;Sit to/from stand   Upper Body Dressing : Minimal assistance;Sitting   Lower Body Dressing: Moderate assistance;Sitting/lateral leans Lower  Body Dressing Details (indicate cue type and reason): donned socks with min guard assist for sitting balance             Functional mobility during ADLs: Moderate assistance (stood and took 2 steps toward [facility])       Vision Additional Comments: L inattention   Perception     Praxis      Pertinent Vitals/Pain Pain Assessment: No/denies pain     Hand Dominance Right   Extremity/Trunk Assessment Upper Extremity Assessment Upper Extremity Assessment: LUE deficits/detail LUE Deficits / Details: uses as functional assist, unable to formally assess due to inability to conform with requirements of MMT, inattention, decreased coordination LUE Sensation: decreased proprioception LUE Coordination: decreased fine motor;decreased gross motor   Lower Extremity Assessment Lower Extremity Assessment: Defer to PT evaluation       Communication Communication Communication: HOH   Cognition Arousal/Alertness: Awake/alert Behavior During Therapy: WFL for tasks assessed/performed Overall Cognitive Status: History of cognitive impairments - at baseline                     General Comments       Exercises       Shoulder Instructions      Home Living Family/patient expects to be discharged to:: Private residence Living Arrangements: Children Available Help at Discharge: Family;Available 24 hours/day Type of Home: House Home Access: Stairs to enter [employer] of Steps: 1 step to porch Entrance Stairs-Rails: None Home Layout: One level  Bathroom Shower/Tub: Producer, television/film/video: Standard Bathroom Accessibility: Yes How Accessible: Accessible via walker Home Equipment: Walker - 2 wheels;Shower seat;Hand held shower head;Bedside commode          Prior Functioning/Environment Level of Independence: Needs assistance  Gait / Transfers Assistance Needed: ambulates with a RW, hx of falls ADL's / Homemaking Assistance Needed: min assist for  bathing, dressing (UB), and setting up meals, supervised for toileting and grooming, daughter performs all IADL   Comments: pt has lived with her daughter since her stroke    OT Diagnosis: Generalized weakness;Cognitive deficits   OT Problem List: Decreased strength;Decreased activity tolerance;Impaired balance (sitting and/or standing);Decreased coordination;Impaired vision/perception;Decreased cognition;Decreased safety awareness;Impaired UE functional use   OT Treatment/Interventions: Dorsi-care/ADL training;DME and/or AE instruction;Patient/family education;Balance training    OT Goals(Current goals can be found in the care plan section) Acute Rehab OT Goals Patient Stated Goal: feel better OT Goal Formulation: With patient/family Time For Goal Achievement: 09/11/15 Potential to Achieve Goals: Good ADL Goals Pt Will Perform Grooming: with min guard assist;standing Pt Will Perform Lower Body Bathing: with min guard assist;sit to/from stand Pt Will Perform Upper Body Dressing: with min assist;sitting Pt Will Perform Lower Body Dressing: with min guard assist;sit to/from stand Pt Will Transfer to Toilet: with min guard assist;ambulating;bedside commode (over toilet) Pt Will Perform Toileting - Clothing Manipulation and hygiene: with min guard assist;sit to/from stand  OT Frequency: Min 2X/week   Barriers to D/C:            Co-evaluation              End of Session Equipment Utilized During Treatment: Gait belt;Rolling walker Nurse Communication: Mobility status  Activity Tolerance: Treatment limited secondary to medical complications (Comment) (nausea) Patient left: in bed;with call bell/phone within reach;with bed alarm set;with family/visitor present   Time: 7829-5621 OT Time Calculation (min): 24 min Charges:  OT General Charges $OT Visit: 1 Procedure OT Evaluation $OT Eval Moderate Complexity: 1 Procedure OT Treatments $Kren Care/Home Management : 8-22  mins G-Codes: OT G-codes **NOT FOR INPATIENT CLASS** Functional Limitation: Walker care Aloi Care Current Status (H0865): At least 40 percent but less than 60 percent impaired, limited or restricted Baez Care Goal Status (H8469): At least 20 percent but less than 40 percent impaired, limited or restricted Merkel Care Discharge Status [phone]): At least 20 percent but less than 40 percent impaired, limited or restricted  Evern Bio 09/04/2015, 11:57 AM  [phone]

## 2015-09-04 NOTE — Plan of Care (Signed)
Problem: Activity: Goal: Risk for activity intolerance will decrease Outcome: Progressing Pt still complains of vertigo-like symptoms with movement, Antivert given 10am, no change for pt. Pt stood and pivoted to [facility], no complaints. Soft, formed, brown BM.

## 2015-09-05 DIAGNOSIS — E860 Dehydration: Secondary | ICD-10-CM | POA: Diagnosis not present

## 2015-09-05 DIAGNOSIS — I4581 Long QT syndrome: Secondary | ICD-10-CM | POA: Diagnosis not present

## 2015-09-05 DIAGNOSIS — N390 Urinary tract infection, site not specified: Secondary | ICD-10-CM | POA: Diagnosis not present

## 2015-09-05 DIAGNOSIS — R55 Syncope and collapse: Secondary | ICD-10-CM | POA: Diagnosis not present

## 2015-09-05 LAB — URINE CULTURE: SPECIAL REQUESTS: NORMAL

## 2015-09-05 MED ORDER — ALPRAZOLAM 0.25 MG PO TABS: 0.2500 mg | ORAL_TABLET | Freq: Every evening | ORAL | Status: DC | PRN

## 2015-09-05 MED ORDER — CIPROFLOXACIN HCL 500 MG PO TABS
250.0000 mg | ORAL_TABLET | Freq: Two times a day (BID) | ORAL | Status: DC
  Filled 2015-09-05: qty 1

## 2015-09-05 MED ORDER — CEPHALEXIN 250 MG PO CAPS: 250.0000 mg | ORAL_CAPSULE | Freq: Two times a day (BID) | ORAL | Status: DC

## 2015-09-05 MED ORDER — MECLIZINE HCL 25 MG PO TABS: 25.0000 mg | ORAL_TABLET | Freq: Three times a day (TID) | ORAL | Status: DC | PRN

## 2015-09-05 MED ORDER — METOPROLOL TARTRATE 25 MG PO TABS: 25.0000 mg | ORAL_TABLET | Freq: Two times a day (BID) | ORAL | Status: DC

## 2015-09-05 MED ORDER — CEPHALEXIN 250 MG PO CAPS
250.0000 mg | ORAL_CAPSULE | Freq: Two times a day (BID) | ORAL | Status: DC
  Administered 2015-09-05: 250 mg via ORAL
  Filled 2015-09-05 (×3): qty 1

## 2015-09-05 MED ORDER — DOCUSATE SODIUM 100 MG PO CAPS: 100.0000 mg | ORAL_CAPSULE | Freq: Two times a day (BID) | ORAL | Status: DC

## 2015-09-05 MED ORDER — ALPRAZOLAM 0.25 MG PO TABS
0.2500 mg | ORAL_TABLET | Freq: Every evening | ORAL | Status: DC | PRN
  Filled 2015-09-05: qty 1

## 2015-09-05 NOTE — Progress Notes (Signed)
Physical Therapy Treatment Patient Details Name: Leah Figueroa MRN: [id number] DOB: 13-Jan-1923 Today's Date: 09/05/2015    History of Present Illness Pt admitted with dizziness/vertigo, dehydration and UTI. PMH: CVA 8/16, arthritis, MI, breast ca, HTN, HLD. Pt has been participating in OPOT and PT.    PT Comments    Patient with some improvement in gait with head turns and less c/o swimmy head/nausea.  Remains high fall risk due to disorientation and needs further skilled PT in the outpatient setting.  Daughter instructed in level of assist for safety at home.  Follow Up Recommendations  Outpatient PT     Equipment Recommendations  None recommended by PT    Recommendations for Other Services       Precautions / Restrictions Precautions Precautions: Fall    Mobility  Bed Mobility         Supine to sit: HOB elevated;Supervision Sit to supine: Supervision   General bed mobility comments: cues and assist to scoot up in bed after return to supine  Transfers Overall transfer level: Needs assistance Equipment used: Rolling walker (2 wheeled) Transfers: Stand Pivot Transfers Sit to Stand: Min guard Stand pivot transfers: Mod assist       General transfer comment: assist to stand with walker min A, pivot to [facility], The mod support due to initially leaning posterior, no attempt to correct due to trying to get brief down to urinate.  Ambulation/Gait Ambulation/Gait assistance: Min guard;Supervision Ambulation Distance (Feet): 300 Feet Assistive device: Rolling walker (2 wheeled) Gait Pattern/deviations: Step-through pattern;Decreased stride length     General Gait Details: encouraged head turns without LOB, but did veer to side; daughter presents and educated on level of assist for safety with ambulation at home due to fall risk with continued disorientation especially when first on her feet or attempting to use L UE (i.e. for don/doff brief).   Stairs             Wheelchair Mobility    Modified Rankin (Stroke Patients Only)       Balance         Postural control: Posterior lean   Standing balance-[name] Scale: Poor Standing balance comment: leaning back when attempting to get to [facility] and doff brief with mod support needed                    Cognition Arousal/Alertness: Awake/alert Behavior During Therapy: WFL for tasks assessed/performed Overall Cognitive Status: History of cognitive impairments - at baseline                      Exercises      General Comments General comments (skin integrity, edema, etc.): Education with patient and daughter on fall risk, need for close supervision to hands on assist for ambulation and standing ADL's; patient especially encouraged to wait for help prior to getting up due to risk of falling; reviewed fall prevention tips for home.      Pertinent Vitals/Pain Pain Assessment: No/denies pain    Home Living                      Prior Function            PT Goals (current goals can now be found in the care plan section) Progress towards PT goals: Progressing toward goals    Frequency  Min 3X/week    PT Plan Current plan remains appropriate    Co-evaluation  End of Session Equipment Utilized During Treatment: Gait belt Activity Tolerance: Patient tolerated treatment well Patient left: in bed;with call bell/phone within reach;with family/visitor present     Time: 9604-5409 PT Time Calculation (min) (ACUTE ONLY): 27 min  Charges:  $Gait Training: 8-22 mins $[place] Care/Home Management: 8-22                    G Codes:      Elray Mcgregor September 18, 2015, 5:36 PM  Sheran Lawless, PT [phone] 2015/09/18

## 2015-09-05 NOTE — Care Management (Signed)
1422 09-05-15 MD to write Rx to resume Outpatient PT services. No further needs from CM at this time. Gala Lewandowsky, RN,BSN [phone]

## 2015-09-05 NOTE — Plan of Care (Signed)
Problem: Activity: Goal: Risk for activity intolerance will decrease Outcome: Progressing Pt to re-evaluate prior to discharge per daughter, orders written.

## 2015-09-05 NOTE — Progress Notes (Signed)
Patient was admitted with virtigo and unstable gait related to UTI, Patient had a CT of head, Negative result, patient not worked up for a stroke.

## 2015-09-08 DIAGNOSIS — N390 Urinary tract infection, site not specified: Secondary | ICD-10-CM | POA: Diagnosis not present

## 2015-09-08 DIAGNOSIS — I69359 Hemiplegia and hemiparesis following cerebral infarction affecting unspecified side: Secondary | ICD-10-CM | POA: Diagnosis not present

## 2015-09-08 DIAGNOSIS — R410 Disorientation, unspecified: Secondary | ICD-10-CM | POA: Diagnosis not present

## 2015-09-08 DIAGNOSIS — F419 Anxiety disorder, unspecified: Secondary | ICD-10-CM | POA: Diagnosis not present

## 2015-09-08 DIAGNOSIS — I10 Essential (primary) hypertension: Secondary | ICD-10-CM | POA: Diagnosis not present

## 2015-09-08 DIAGNOSIS — D509 Iron deficiency anemia, unspecified: Secondary | ICD-10-CM | POA: Diagnosis not present

## 2015-09-08 NOTE — Discharge Summary (Signed)
Physician Discharge Summary  Tracye Szuch Tisdell WGN:[id number] DOB: [birth year] DOA: 09/03/2015  PCP: Ginette Otto, MD  Admit date: 09/03/2015 Discharge date: 09/05/2015  Time spent: 25 minutes  Recommendations for Outpatient Follow-up:  1. Follow up with PCP in one week post hospitalization.    Discharge Diagnoses:  Active Problems:   Essential hypertension   Vertigo   Dehydration   UTI (lower urinary tract infection)   Prolonged Q-T interval on ECG   Discharge Condition: improved.  Diet recommendation: low sodium diet.   Filed Weights   09/03/15 2140 09/04/15 0500 09/05/15 0628  Weight: 48.127 kg (106 lb 1.6 oz) 48.308 kg (106 lb 8 oz) 46.539 kg (102 lb 9.6 oz)    History of present illness:  Leah Figueroa is a 82 y.o. female   has a past medical history of Other specified personal history presenting hazards to health(V15.89); Personal history of arthritis presents with dizziness and increased confusion.   Hospital Course:  1. Orthostatic hypotension/ dehydration/ dizziness: Admitted to telemetry. Hydrate and repeat orthostatics are negative.  Decreased the dose of metoprolol for bradycardia.    2. Ecoli UTI Discharged on keflex.   3. Prolonged qt interval: Improved.   4. Hypothyroidism: TSH within normal limits. Resume synthroid.     Procedures:  none  Consultations:  none  Discharge Exam: Filed Vitals:   09/05/15 0824 09/05/15 1402  BP: 170/70 150/68  Pulse: 74 58  Temp:  98.2 F (36.8 C)  Resp:  18    General: alert comfortable Cardiovascular: s1s2 Respiratory: ctab  Discharge Instructions   Discharge Instructions    Diet - low sodium heart healthy    Complete by:  As directed      Discharge instructions    Complete by:  As directed   Follow up with PCP in one week.          Discharge Medication List as of 09/05/2015  5:21 PM    START taking these medications   Details  ALPRAZolam (XANAX) 0.25 MG tablet Take 1 tablet (0.25 mg  total) by mouth at bedtime as needed for anxiety., Starting 09/05/2015, Until Discontinued, Print    docusate sodium (COLACE) 100 MG capsule Take 1 capsule (100 mg total) by mouth 2 (two) times daily., Starting 09/05/2015, Until Discontinued, Print    meclizine (ANTIVERT) 25 MG tablet Take 1 tablet (25 mg total) by mouth 3 (three) times daily as needed for dizziness., Starting 09/05/2015, Until Discontinued, Normal      CONTINUE these medications which have CHANGED   Details  cephALEXin (KEFLEX) 250 MG capsule Take 1 capsule (250 mg total) by mouth every 12 (twelve) hours., Starting 09/05/2015, Until Discontinued, Print    metoprolol tartrate (LOPRESSOR) 25 MG tablet Take 1 tablet (25 mg total) by mouth 2 (two) times daily., Starting 09/05/2015, Until Discontinued, Print      CONTINUE these medications which have NOT CHANGED   Details  acetaminophen (TYLENOL) 325 MG tablet Take 2 tablets (650 mg total) by mouth every 4 (four) hours as needed for mild pain., Starting 04/07/2015, Until Discontinued, No Print    amLODipine (NORVASC) 2.5 MG tablet Take 2.5 mg by mouth daily., Until Discontinued, Historical Med    atorvastatin (LIPITOR) 10 MG tablet Take 10 mg by mouth daily., Until Discontinued, Historical Med    cholecalciferol (VITAMIN D) 400 UNITS TABS tablet Take 400 Units by mouth daily., Until Discontinued, Historical Med    levothyroxine (SYNTHROID, LEVOTHROID) 25 MCG tablet Take 1 tablet by mouth  daily, Normal    vitamin B-12 (CYANOCOBALAMIN) 1000 MCG tablet Take 1,000 mcg by mouth 2 (two) times daily. , Until Discontinued, Historical Med    clopidogrel (PLAVIX) 75 MG tablet Take 1 tablet (75 mg total) by mouth daily., Starting 04/07/2015, Until Discontinued, No Print       No Known Allergies Follow-up Information    Follow up with Ginette Otto, MD. Schedule an appointment as soon as possible for a visit in 1 week.   Specialty:  Internal Medicine   Contact information:    [address] [place] [address] [phone]        The results of significant diagnostics from this hospitalization (including imaging, microbiology, ancillary and laboratory) are listed below for reference.    Significant Diagnostic Studies: Ct Head Wo Contrast  09/03/2015  CLINICAL DATA:  Near syncopal episodes.  Denies fall. EXAM: CT HEAD WITHOUT CONTRAST TECHNIQUE: Contiguous axial images were obtained from the base of the skull through the vertex without intravenous contrast. COMPARISON:  Head CT dated 07/18/2015. FINDINGS: Again noted is generalized brain atrophy with commensurate dilatation of the ventricles and sulci. Ventricles are stable in size and configuration. Confluent areas of chronic small vessel ischemic change again noted throughout the bilateral periventricular and subcortical white matter regions, stable. Again noted is an old infarct within the right parietal-occipital lobe with associated encephalomalacia. There is no mass, hemorrhage, edema or other evidence of acute parenchymal abnormality. No extra-axial hemorrhage. There are chronic calcified atherosclerotic changes of the large vessels at the skull base. No evidence of acute osseous fracture or dislocation. Slight rightward deformity of the nasal bones is chronic. Paranasal sinuses and mastoid air cells are clear. Superficial soft tissues are unremarkable. IMPRESSION: 1. Atrophy and chronic ischemic changes as detailed above. 2. No acute findings. No evidence of acute intracranial abnormality. No intracranial mass, hemorrhage or edema. Electronically Signed   By: Bary [name] M.D.   On: 09/03/2015 15:05   Mr Brain Wo Contrast  09/03/2015  CLINICAL DATA:  Vertigo. Intermittent dizziness and balance issues since early this morning. Recent stroke. EXAM: MRI HEAD WITHOUT CONTRAST TECHNIQUE: Multiplanar, multiecho pulse sequences of the brain and surrounding structures were obtained without intravenous  contrast. COMPARISON:  Head CT 09/03/2015 and MRI 03/24/2015 FINDINGS: There is no evidence of acute infarct, mass, midline shift, or extra-axial fluid collection. There is moderate cerebral atrophy. A chronic right MCA territory infarct is again seen involving the parietal lobe. Confluent T2 hyperintensities throughout the cerebral white matter have not significantly changed from the prior MRI and are compatible with severe chronic small vessel ischemic disease. Numerous chronic microhemorrhages are again seen in the cerebellum. A chronic microhemorrhage is also again seen at the lateral margin of the left thalamus. Chronic lacunar infarcts are noted in the basal ganglia bilaterally and in the pons. Prior bilateral cataract extraction is noted. Paranasal sinuses and mastoid air cells are clear. Leftward nasal septal deviation. Major intracranial vascular flow voids are preserved. 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 IMPRESSION: 1. No acute intracranial abnormality. 2. Chronic right parietal infarct. 3. Severe chronic small vessel ischemic disease. Electronically Signed   By: Sebastian Ache M.D.   On: 09/03/2015 17:23    Microbiology: Recent Results (from the past 240 hour(s))  Urine culture     Status: None   Collection Time: 09/03/15  3:40 PM  Result Value Ref Range Status   Specimen Description URINE, RANDOM  Final   Special Requests Normal  Final  Culture >=100,000 COLONIES/mL ESCHERICHIA COLI  Final   Report Status 09/05/2015 FINAL  Final   Organism ID, Bacteria ESCHERICHIA COLI  Final      Susceptibility   Escherichia coli - MIC*    AMPICILLIN <=2 SENSITIVE Sensitive     CEFAZOLIN <=4 SENSITIVE Sensitive     CEFTRIAXONE <=1 SENSITIVE Sensitive     CIPROFLOXACIN <=0.25 SENSITIVE Sensitive     GENTAMICIN <=1 SENSITIVE Sensitive     IMIPENEM <=0.25 SENSITIVE Sensitive     NITROFURANTOIN <=16 SENSITIVE Sensitive     TRIMETH/SULFA <=20 SENSITIVE Sensitive      AMPICILLIN/SULBACTAM <=2 SENSITIVE Sensitive     PIP/TAZO <=4 SENSITIVE Sensitive     * >=100,000 COLONIES/mL ESCHERICHIA COLI     Labs: Basic Metabolic Panel:  Recent Labs Lab 09/03/15 1317 09/04/15 0349  NA 142 142  K 3.9 4.1  CL 107 108  CO2 26 28  GLUCOSE 102* 107*  BUN 11 8  CREATININE 0.53 0.59  CALCIUM 9.2 9.3  MG  --  1.8  PHOS  --  3.6   Liver Function Tests:  Recent Labs Lab 09/04/15 0349  AST 18  ALT 14  ALKPHOS 64  BILITOT 0.8  PROT 6.8  ALBUMIN 3.3*   No results for input(s): LIPASE, AMYLASE in the last 168 hours. No results for input(s): AMMONIA in the last 168 hours. CBC:  Recent Labs Lab 09/03/15 1317 09/04/15 0349  WBC 5.3 6.6  HGB 11.9* 12.0  HCT 37.1 37.2  MCV 90.7 89.0  PLT 193 195   Cardiac Enzymes: No results for input(s): CKTOTAL, CKMB, CKMBINDEX, TROPONINI in the last 168 hours. BNP: BNP (last 3 results) No results for input(s): BNP in the last 8760 hours.  ProBNP (last 3 results) No results for input(s): PROBNP in the last 8760 hours.  CBG:  Recent Labs Lab 09/03/15 1816  GLUCAP 104*       SignedKathlen Mody MD.  [facility] 09/08/2015, 1:16 AM

## 2015-09-09 ENCOUNTER — Ambulatory Visit: Payer: Medicare Other | Admitting: Occupational Therapy

## 2015-09-09 ENCOUNTER — Ambulatory Visit: Payer: Medicare Other | Admitting: Physical Therapy

## 2015-09-09 ENCOUNTER — Encounter: Payer: Self-pay | Admitting: Physical Therapy

## 2015-09-09 DIAGNOSIS — R2689 Other abnormalities of gait and mobility: Secondary | ICD-10-CM

## 2015-09-09 DIAGNOSIS — R269 Unspecified abnormalities of gait and mobility: Secondary | ICD-10-CM | POA: Diagnosis not present

## 2015-09-09 DIAGNOSIS — R29818 Other symptoms and signs involving the nervous system: Secondary | ICD-10-CM | POA: Diagnosis not present

## 2015-09-09 DIAGNOSIS — IMO0002 Reserved for concepts with insufficient information to code with codable children: Secondary | ICD-10-CM

## 2015-09-09 DIAGNOSIS — R293 Abnormal posture: Secondary | ICD-10-CM

## 2015-09-09 DIAGNOSIS — Z7409 Other reduced mobility: Secondary | ICD-10-CM

## 2015-09-09 DIAGNOSIS — R2681 Unsteadiness on feet: Secondary | ICD-10-CM

## 2015-09-09 DIAGNOSIS — I69898 Other sequelae of other cerebrovascular disease: Secondary | ICD-10-CM | POA: Diagnosis not present

## 2015-09-09 DIAGNOSIS — R531 Weakness: Secondary | ICD-10-CM | POA: Diagnosis not present

## 2015-09-09 DIAGNOSIS — R6889 Other general symptoms and signs: Secondary | ICD-10-CM | POA: Diagnosis not present

## 2015-09-09 NOTE — Therapy (Signed)
[facility] [address] [place], [place], [address] Phone: [phone]   Fax:  [phone]  Physical Therapy Treatment  Patient Details  Name: Leah Figueroa MRN: [id number] Date of Birth: [birth year] Referring Provider: Claudette Laws, MD  Encounter Date: 09/09/2015      PT End of Session - 09/09/15 1315    Visit Number 11   Number of Visits 16   Date for PT Re-Evaluation 09/19/15   Authorization Type Medicare G-Code & progress note every 10 visits   PT Start Time 1230   PT Stop Time 1313   PT Time Calculation (min) 43 min   Equipment Utilized During Treatment Gait belt   Activity Tolerance Patient tolerated treatment well   Behavior During Therapy [facility] for tasks assessed/performed      Past Medical History  Diagnosis Date  . Other specified personal history presenting hazards to health(V15.89)   . Personal history of arthritis   . Unspecified personal history presenting hazards to health   . Cramp of limb   . Acute myocardial infarction of other anterior wall, episode of care unspecified   . Personal history of malignant neoplasm of breast   . Unspecified essential hypertension   . Personal history of other diseases of digestive system   . Unspecified hypothyroidism   . Other and unspecified hyperlipidemia   . Stroke [facility]) 03/2015    "left sided issues"  . Coronary artery disease   . Arthritis   . Cancer (HCC)     hx of breast lumpectomy     Past Surgical History  Procedure Laterality Date  . Right thyroid lobectomy    . Cataract extraction      left-2006, right 2011  . Intrathoracic goiter resection    . Cholecystectomy    . Lumpectomy for breast cancer Left 80's    radiation therapy  . Back surgery      There were no vitals filed for this visit.  Visit Diagnosis:  Weakness due to cerebrovascular accident  Decreased functional mobility and endurance  Abnormality of  gait  Unsteadiness  Balance problems  Lack of coordination due to stroke  Posture abnormality      Subjective Assessment - 09/09/15 1238    Subjective She was hospitalized 09/03/2015 to 09/05/2015 with dehydration & UTI. Her daughter reports set back with patient needing more help. They brought Rx that patient is safe to resume PT / OT.    Patient is accompained by: Family member   Currently in Pain? No/denies                         [facility] Adult PT Treatment/Exercise - 09/09/15 1230    Ambulation/Gait   Ambulation/Gait Yes   Ambulation/Gait Assistance 5: Supervision   Ambulation/Gait Assistance Details PT instructed daughter that patient needs supervision but does not appear to need assistance. Per dtr she was advised to give her assistance until she saw PT & got clearance to resume supervision PT cued pt to correct Lhand position.    Ambulation Distance (Feet) 400 Feet   Assistive device Rolling walker   Gait Pattern Step-through pattern;Decreased step length - left;Decreased stride length;Decreased dorsiflexion - left;Trunk flexed;Narrow base of support   Ambulation Surface Indoor;Level   Ramp 4: Min assist   Ramp Details (indicate cue type and reason) cues on RW control & posture   Curb 4: Min assist   Curb Details (indicate cue type and reason) cues  on sequence / technique for safety   [place]-Care   [place]-Care ADL's   ADL's Patient able to stand without UE support with RW close for safety and manage pants to toilet with supervision. Sit to/from stand toilet to RW for stablization with supervision. Patient stood without UE support to wash her hands with supervision.              Balance Exercises - 09/09/15 1537    OTAGO PROGRAM   Head Movements Sitting;5 reps   Neck Movements Sitting;5 reps   Back Extension Standing;5 reps   Trunk Movements Standing;5 reps   Ankle Movements Sitting;10 reps   Knee Extensor 10 reps   Knee Flexor 10 reps  RW support    Hip ABductor 10 reps  RW support   Ankle Plantorflexors 20 reps, support   Ankle Dorsiflexors 20 reps, support   Knee Bends 10 reps, support  RW support           PT Education - 09/09/15 1315    Education provided Yes   Education Details fluid intake, UTIs, incontinence & dehydration connections   Person(s) Educated Patient;Child(ren)   Methods Explanation;Verbal cues   Comprehension Verbalized understanding;Verbal cues required;Need further instruction          PT Short Term Goals - 08/19/15 1236    PT SHORT TERM GOAL #1   Title Patient demonstrates understanding of inital HEP. (Target Date: 08/20/2014)   Baseline 08/19/15: pt returns demo with visual cues, some verbal cues of OTAGO program. Per pt/daughter report she is not complaint with performing it at home.   Status Achieved   PT SHORT TERM GOAL #2   Title Timed Up & Go without device with supervision <18sec. (Target Date: 08/20/2014)   Baseline 08/19/15: 15.38 sec's without AD with supervision.   Status Achieved   PT SHORT TERM GOAL #3   Title Paitent ambulates 300' with rollator walker with supervision. (Target Date: 08/20/2014)   Baseline 08/19/15: met today.   Period Months   Status Achieved           PT Long Term Goals - 08/28/15 1307    PT LONG TERM GOAL #1   Title Berg Balance >45/56 to indicate lower fall risk (Target Date: 09/18/2014)   Baseline 08/27/15: scored 48/56 today   Status Achieved   PT LONG TERM GOAL #2   Title Timed Up & Go without device safely <15 sec. (Target Date: 09/18/2014)   Time 2   Period Months   Status On-going   PT LONG TERM GOAL #3   Title Patient ambulates 400' with rollator walker with supervision for community mobility. (Target Date: 09/18/2014)   Time 2   Period Months   Status On-going   PT LONG TERM GOAL #4   Title Patient ambulates 100' around furniture with LRAD modified independent. (Target Date: 09/18/2014)   Time 2   Status On-going   PT LONG TERM GOAL #5   Title Patient  negotiates ramps, curbs with rollator walker and stairs with 1 rail with supervision. (Target Date: 09/18/2014)   Time 2   Period Months   Status On-going               Plan - 09/09/15 1315    Clinical Impression Statement Patient had increased fatigue with recent hospitalization but appeared safe to ambulate with RW with supervision. Patient continues to need cues to check LUE due to impaired proprioception / sensation.    Pt will benefit from skilled  therapeutic intervention in order to improve on the following deficits Abnormal gait;Decreased activity tolerance;Decreased balance;Decreased endurance;Decreased mobility;Decreased strength;Postural dysfunction;Decreased safety awareness   Rehab Potential Good   PT Frequency 2x / week   PT Duration 8 weeks   PT Treatment/Interventions ADLs/[facility];DME Instruction;Gait training;Stair training;Functional mobility training;Therapeutic activities;Therapeutic exercise;Balance training;Neuromuscular re-education;Patient/family education   PT Next Visit Plan  Continue toward LTG's.   Consulted and Agree with Plan of Care Patient;Family member/caregiver   Family Member Consulted pt's dtr        Problem List Patient Active Problem List   Diagnosis Date Noted  . Vertigo 09/03/2015  . Dehydration 09/03/2015  . UTI (lower urinary tract infection) 09/03/2015  . Prolonged Q-T interval on ECG 09/03/2015  . Left-sided neglect 07/01/2015  . History of right MCA stroke 07/01/2015  . Altered sensation due to stroke 04/29/2015  . Cognitive deficit, post-stroke 04/29/2015  . Pseudogout of left wrist 03/28/2015  . Hyperlipidemia LDL goal <70 03/26/2015  . Hyperglycemia 03/26/2015  . 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 Embolic stroke ([place])   . Left hemiplegia ([place])   . Homonymous hemianopia   . Chest wall trauma 01/11/2014  . Injury of left lower arm 01/11/2014  . Headache(784.0)  01/04/2013  . Allergic rhinitis, cause unspecified 01/04/2013  . Anemia 04/19/2012  . Epistaxis, recurrent 04/19/2012  . Hypothyroidism 01/01/2008  . Essential hypertension 01/01/2008  . HYPERTENSION 01/01/2008  . MYOCARDIAL INFARCTION, ACUTE, ANTERIOR WALL 01/01/2008  . LEG CRAMPS 01/01/2008  . ADENOCARCINOMA, BREAST, HX OF 01/01/2008  . DIVERTICULOSIS, COLON, HX OF 01/01/2008  . ARTHRITIS, HX OF 01/01/2008  . RADIATION THERAPY, HX OF 01/01/2008  . CATARACT EXTRACTION, HX OF 01/01/2008  . CHOLECYSTECTOMY, LAPAROSCOPIC, HX OF 01/01/2008    Jamey Reas PT, DPT 09/09/2015, 3:48 PM  [facility] [address] [place], [place], [address] Phone: [phone]   Fax:  [phone]  Name: Nehemiah Montee Catterton MRN: [id number] Date of Birth: 27-Feb-1923

## 2015-09-09 NOTE — Therapy (Signed)
[facility] [address] [place], [place], [address] Phone: [phone]   Fax:  [phone]  Occupational Therapy Treatment  Patient Details  Name: Leah Figueroa MRN: [id number] Date of Birth: 05/28/1923 Referring Provider: Dr. Claudette Laws  Encounter Date: 09/09/2015      OT End of Session - 09/09/15 1417    Visit Number 11   Number of Visits 17   Date for OT Re-Evaluation 09/20/15   Authorization Type MCR - G code needed   Authorization - Visit Number 11   Authorization - Number of Visits 20   OT Start Time 1318   OT Stop Time 1403   OT Time Calculation (min) 45 min   Activity Tolerance Patient tolerated treatment well      Past Medical History  Diagnosis Date  . Other specified personal history presenting hazards to health(V15.89)   . Personal history of arthritis   . Unspecified personal history presenting hazards to health   . Cramp of limb   . Acute myocardial infarction of other anterior wall, episode of care unspecified   . Personal history of malignant neoplasm of breast   . Unspecified essential hypertension   . Personal history of other diseases of digestive system   . Unspecified hypothyroidism   . Other and unspecified hyperlipidemia   . Stroke [facility]) 03/2015    "left sided issues"  . Coronary artery disease   . Arthritis   . Cancer (HCC)     hx of breast lumpectomy     Past Surgical History  Procedure Laterality Date  . Right thyroid lobectomy    . Cataract extraction      left-2006, right 2011  . Intrathoracic goiter resection    . Cholecystectomy    . Lumpectomy for breast cancer Left 80's    radiation therapy  . Back surgery      There were no vitals filed for this visit.  Visit Diagnosis:  Weakness due to cerebrovascular accident  Decreased functional mobility and endurance  Vision disturbance following cerebrovascular accident      Subjective Assessment - 09/09/15  1320    Subjective  Pt returns after hospitalization from dehydration and UTI with resume outpatient O.T. orders (to be scanned in EPIC)   Patient is accompained by: Family member  daughter   Pertinent History back surgery, breast CA   Limitations HOH   Patient Stated Goals to drop less from Lt hand   Currently in Pain? No/denies            [facility] OT Assessment - 09/09/15 0001    Observation/Other Assessments   Observations Pt returns today after hospitalization from dehydration and UTI with resume outpatient O.T. orders   Coordination   Left 9 Hole Peg Test 50.78 sec   Hand Function   Right Hand Grip (lbs) 35 lbs   Left Hand Grip (lbs) 22 lbs avg (20, 25, 20 lbs)                   OT Treatments/Exercises (OP) - 09/09/15 0001    ADLs   Functional Mobility Walker negotiation through obstacles with decreased attention to Lt side getting walker caught on stool on Lt. Pt also asked to repeat trail for memory and unable to perform correctly. Pt was then asked to follow trail that therapist demo - pt was able perform first 1/2 correctly but could not remember 2nd half. Pt then asked to scan environment for objects in gym  while ambulating with several misses (mostly on Lt side) and cues to turn head. Pt then went into kitchen to retrieve items from refrigerator and cabinet with max cueing for safety/fall prevention. Pt would also forget to place Lt hand back on walker properly and would begin to slide off without pt being aware.    ADL Comments Daughter reports Kath care status is back to where she was before hospitalization except having more trouble realizing her pants are not pulled fully up over buttocks.                   OT Short Term Goals - 08/21/15 1700    OT SHORT TERM GOAL #1   Title Indepndent with HEP for Lt hand coordination and strength (all STG's due 08/21/15)    Time 4   Period Weeks   Status Partially Met  08/21/15: Cueing required to perform correctly    OT SHORT TERM GOAL #2   Title Pt to verbalize understanding with visual strategies for Lt inattention and safety considerations with impaired sensation LUE   Time 4   Period Weeks   Status Partially Met  issued and educated pt/daughter, but pt requires cues to implement   OT SHORT TERM GOAL #3   Title Indpependent with all BADLS   Baseline 8/88/28- pt needs mod A per daughter   Time 4   Period Weeks   Status Not Met  daughter reports max assist for UE dressing, and min assist occasionally for donning pants; max assist for bathing. (However, in clinic pt demo ability to don/doff shirt with min to mod cueing and little to no assist)   OT SHORT TERM GOAL #4   Title Improve grip strength Lt hand to 25 lbs or greater to assist with opening containers   Baseline 08/18/14- 25 lbs   Time 4   Period Weeks   Status Achieved   OT SHORT TERM GOAL #5   Title Improve coordination as evidenced by performing 9 hole peg test in 75 sec. or less Lt hand   Baseline 08/18/14-64.47   Time 4   Period Weeks   Status Achieved           OT Long Term Goals - 09/09/15 1418    OT LONG TERM GOAL #1   Title Independent with updated HEP (ALL LTG's due 09/20/15)   Time 8   Period Weeks   Status On-going   OT LONG TERM GOAL #2   Title Independent with retrieving snack and drink from kitchen with DME prn   Baseline dependent   Time 8   Period Weeks   Status Revised   OT LONG TERM GOAL #3   Title Improve coordination as evidenced by performing 9 hole peg test in 60 sec. or under Lt hand   Baseline 93.37 sec   Time 8   Period Weeks   Status Achieved  08/27/15: 53.65 sec, 09/09/15: 50.78 sec   OT LONG TERM GOAL #4   Title Pt to perform enviornmental scanning with 90% accuracy or greater   Time 8   Period Weeks   Status On-going   OT LONG TERM GOAL #5   Title Pt to return to simple financial management and medication management with supervision/cueing prn for accuracy/safety   Time 8   Period Weeks    Status Deferred  Pt's daughter has taken over financial management due to cognitive deficits  Plan - 09/09/15 1420    Clinical Impression Statement Pt returns today after hospitalization with resume outpatient O.T. orders and back to previous status except slightly increased difficulty donning pants and decreased attention to Lt side   Plan [place] care, continue walker negotiation and environmental scanning   OT Home Exercise Plan coordination and putty HEP 07/28/15.    Consulted and Agree with Plan of Care Patient;Family member/caregiver   Family Member Consulted daughter        Problem List Patient Active Problem List   Diagnosis Date Noted  . Vertigo 09/03/2015  . Dehydration 09/03/2015  . UTI (lower urinary tract infection) 09/03/2015  . Prolonged Q-T interval on ECG 09/03/2015  . Left-sided neglect 07/01/2015  . History of right MCA stroke 07/01/2015  . Altered sensation due to stroke 04/29/2015  . Cognitive deficit, post-stroke 04/29/2015  . Pseudogout of left wrist 03/28/2015  . Hyperlipidemia LDL goal <70 03/26/2015  . Hyperglycemia 03/26/2015  . 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 Embolic stroke ([place])   . Left hemiplegia ([place])   . Homonymous hemianopia   . Chest wall trauma 01/11/2014  . Injury of left lower arm 01/11/2014  . Headache(784.0) 01/04/2013  . Allergic rhinitis, cause unspecified 01/04/2013  . Anemia 04/19/2012  . Epistaxis, recurrent 04/19/2012  . Hypothyroidism 01/01/2008  . Essential hypertension 01/01/2008  . HYPERTENSION 01/01/2008  . MYOCARDIAL INFARCTION, ACUTE, ANTERIOR WALL 01/01/2008  . LEG CRAMPS 01/01/2008  . ADENOCARCINOMA, BREAST, HX OF 01/01/2008  . DIVERTICULOSIS, COLON, HX OF 01/01/2008  . ARTHRITIS, HX OF 01/01/2008  . RADIATION THERAPY, HX OF 01/01/2008  . CATARACT EXTRACTION, HX OF 01/01/2008  . CHOLECYSTECTOMY, LAPAROSCOPIC, HX OF 01/01/2008    Carey Bullocks, OTR/L 09/09/2015, 2:22 PM  [facility] [address] [place], [place], [address] Phone: [phone]   Fax:  [phone]  Name: Bailie Christenbury Jorgensen MRN: [id number] Date of Birth: 08-06-1923

## 2015-09-11 ENCOUNTER — Encounter: Payer: Self-pay | Admitting: Occupational Therapy

## 2015-09-11 ENCOUNTER — Encounter: Payer: Self-pay | Admitting: Physical Therapy

## 2015-09-11 ENCOUNTER — Ambulatory Visit: Payer: Medicare Other | Admitting: Occupational Therapy

## 2015-09-11 ENCOUNTER — Ambulatory Visit: Payer: Medicare Other | Admitting: Physical Therapy

## 2015-09-11 DIAGNOSIS — IMO0002 Reserved for concepts with insufficient information to code with codable children: Secondary | ICD-10-CM

## 2015-09-11 DIAGNOSIS — R6889 Other general symptoms and signs: Secondary | ICD-10-CM

## 2015-09-11 DIAGNOSIS — Z7409 Other reduced mobility: Secondary | ICD-10-CM

## 2015-09-11 DIAGNOSIS — R269 Unspecified abnormalities of gait and mobility: Secondary | ICD-10-CM | POA: Diagnosis not present

## 2015-09-11 DIAGNOSIS — R29818 Other symptoms and signs involving the nervous system: Secondary | ICD-10-CM | POA: Diagnosis not present

## 2015-09-11 DIAGNOSIS — I69898 Other sequelae of other cerebrovascular disease: Secondary | ICD-10-CM | POA: Diagnosis not present

## 2015-09-11 DIAGNOSIS — R2681 Unsteadiness on feet: Secondary | ICD-10-CM

## 2015-09-11 DIAGNOSIS — I69398 Other sequelae of cerebral infarction: Secondary | ICD-10-CM

## 2015-09-11 DIAGNOSIS — H539 Unspecified visual disturbance: Secondary | ICD-10-CM

## 2015-09-11 DIAGNOSIS — R2689 Other abnormalities of gait and mobility: Secondary | ICD-10-CM

## 2015-09-11 DIAGNOSIS — R293 Abnormal posture: Secondary | ICD-10-CM

## 2015-09-11 DIAGNOSIS — R531 Weakness: Secondary | ICD-10-CM | POA: Diagnosis not present

## 2015-09-11 NOTE — Therapy (Signed)
[facility] [address] [place], [place], [address] Phone: [phone]   Fax:  [phone]  Occupational Therapy Treatment  Patient Details  Name: Leah Figueroa MRN: [id number] Date of Birth: [birth year] Referring Provider: Dr. Alysia Penna  Encounter Date: 09/11/2015      OT End of Session - 09/11/15 1653    Visit Number 12   Number of Visits 17   Date for OT Re-Evaluation 09/20/15   Authorization Type MCR - G code needed   Authorization - Visit Number 12   Authorization - Number of Visits 20   OT Start Time 1450   OT Stop Time 1530   OT Time Calculation (min) 40 min   Activity Tolerance Patient tolerated treatment well      Past Medical History  Diagnosis Date  . Other specified personal history presenting hazards to health(V15.89)   . Personal history of arthritis   . Unspecified personal history presenting hazards to health   . Cramp of limb   . Acute myocardial infarction of other anterior wall, episode of care unspecified   . Personal history of malignant neoplasm of breast   . Unspecified essential hypertension   . Personal history of other diseases of digestive system   . Unspecified hypothyroidism   . Other and unspecified hyperlipidemia   . Stroke [facility]) 03/2015    "left sided issues"  . Coronary artery disease   . Arthritis   . Cancer ([place])     hx of breast lumpectomy     Past Surgical History  Procedure Laterality Date  . Right thyroid lobectomy    . Cataract extraction      left-2006, right 2011  . Intrathoracic goiter resection    . Cholecystectomy    . Lumpectomy for breast cancer Left 80's    radiation therapy  . Back surgery      There were no vitals filed for this visit.  Visit Diagnosis:  Weakness due to cerebrovascular accident  Decreased functional mobility and endurance  Vision disturbance following cerebrovascular accident      Subjective Assessment - 09/11/15  1456    Patient is accompained by: Family member   Pertinent History back surgery, breast CA   Limitations HOH   Patient Stated Goals to drop less from Lt hand   Currently in Pain? No/denies                      OT Treatments/Exercises (OP) - 09/11/15 0001    ADLs   ADL Comments Discussed safety options for bathing. Daughter reports pt does not wish to get in shower   Visual/Perceptual Exercises   Scanning Environmental   Scanning - Environmental Pt ambulating while performing environmental scanning to increase awareness of surroundings: 1st trial 7/13 objects id (54% accuracy), 2nd trial 11/13 id (84% accuracy) in quiet environment. Progressed to busy gym environment: 1st trial 5/12 id (42% accuracy), 2nd trial 8/12 id ( 66% accuracy)   Other Exercises Copying numbers from Lt side of page to Rt side of page with multiple errors (7 errors)                  OT Short Term Goals - 08/21/15 1700    OT SHORT TERM GOAL #1   Title Indepndent with HEP for Lt hand coordination and strength (all STG's due 08/21/15)    Time 4   Period Weeks   Status Partially Met  08/21/15: Cueing required to  perform correctly   OT SHORT TERM GOAL #2   Title Pt to verbalize understanding with visual strategies for Lt inattention and safety considerations with impaired sensation LUE   Time 4   Period Weeks   Status Partially Met  issued and educated pt/daughter, but pt requires cues to implement   OT SHORT TERM GOAL #3   Title Indpependent with all BADLS   Baseline 2/42/35- pt needs mod A per daughter   Time 4   Period Weeks   Status Not Met  daughter reports max assist for UE dressing, and min assist occasionally for donning pants; max assist for bathing. (However, in clinic pt demo ability to don/doff shirt with min to mod cueing and little to no assist)   OT SHORT TERM GOAL #4   Title Improve grip strength Lt hand to 25 lbs or greater to assist with opening containers   Baseline  08/18/14- 25 lbs   Time 4   Period Weeks   Status Achieved   OT SHORT TERM GOAL #5   Title Improve coordination as evidenced by performing 9 hole peg test in 75 sec. or less Lt hand   Baseline 08/18/14-64.47   Time 4   Period Weeks   Status Achieved           OT Long Term Goals - 09/09/15 1418    OT LONG TERM GOAL #1   Title Independent with updated HEP (ALL LTG's due 09/20/15)   Time 8   Period Weeks   Status On-going   OT LONG TERM GOAL #2   Title Independent with retrieving snack and drink from kitchen with DME prn   Baseline dependent   Time 8   Period Weeks   Status Revised   OT LONG TERM GOAL #3   Title Improve coordination as evidenced by performing 9 hole peg test in 60 sec. or under Lt hand   Baseline 93.37 sec   Time 8   Period Weeks   Status Achieved  08/27/15: 53.65 sec, 09/09/15: 50.78 sec   OT LONG TERM GOAL #4   Title Pt to perform enviornmental scanning with 90% accuracy or greater   Time 8   Period Weeks   Status On-going   OT LONG TERM GOAL #5   Title Pt to return to simple financial management and medication management with supervision/cueing prn for accuracy/safety   Time 8   Period Weeks   Status Deferred  Pt's daughter has taken over financial management due to cognitive deficits               Plan - 09/11/15 1654    Clinical Impression Statement Pt with decreased ability to progress d/t cognitive deficits. Pt with decreased visual and working [employer] continue safety with [facility] care needs, anticipate d/c next week   OT Home Exercise Plan coordination and putty HEP 07/28/15.    Consulted and Agree with Plan of Care Patient;Family member/caregiver   Family Member Consulted daughter        Problem List Patient Active Problem List   Diagnosis Date Noted  . Vertigo 09/03/2015  . Dehydration 09/03/2015  . UTI (lower urinary tract infection) 09/03/2015  . Prolonged Q-T interval on ECG 09/03/2015  . Left-sided neglect 07/01/2015   . History of right MCA stroke 07/01/2015  . Altered sensation due to stroke 04/29/2015  . Cognitive deficit, post-stroke 04/29/2015  . Pseudogout of left wrist 03/28/2015  . Hyperlipidemia LDL goal <70 03/26/2015  .  Hyperglycemia 03/26/2015  . 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 Embolic stroke ([place])   . Left hemiplegia ([place])   . Homonymous hemianopia   . Chest wall trauma 01/11/2014  . Injury of left lower arm 01/11/2014  . Headache(784.0) 01/04/2013  . Allergic rhinitis, cause unspecified 01/04/2013  . Anemia 04/19/2012  . Epistaxis, recurrent 04/19/2012  . Hypothyroidism 01/01/2008  . Essential hypertension 01/01/2008  . HYPERTENSION 01/01/2008  . MYOCARDIAL INFARCTION, ACUTE, ANTERIOR WALL 01/01/2008  . LEG CRAMPS 01/01/2008  . ADENOCARCINOMA, BREAST, HX OF 01/01/2008  . DIVERTICULOSIS, COLON, HX OF 01/01/2008  . ARTHRITIS, HX OF 01/01/2008  . RADIATION THERAPY, HX OF 01/01/2008  . CATARACT EXTRACTION, HX OF 01/01/2008  . CHOLECYSTECTOMY, LAPAROSCOPIC, HX OF 01/01/2008    Carey Bullocks, OTR/L 09/11/2015, 4:56 PM  [facility] [address] [place], [place], [address] Phone: [phone]   Fax:  [phone]  Name: Leah Figueroa MRN: [id number] Date of Birth: [birth year]

## 2015-09-12 NOTE — Therapy (Signed)
[facility] [address] [place], [place], [address] Phone: [phone]   Fax:  [phone]  Physical Therapy Treatment  Patient Details  Name: Leah Figueroa MRN: [id number] Date of Birth: 09/19/1922 Referring Provider: Alysia Penna, MD  Encounter Date: 09/11/2015      PT End of Session - 09/11/15 1615    Visit Number 12   Number of Visits 16   Date for PT Re-Evaluation 09/19/15   Authorization Type Medicare G-Code & progress note every 10 visits   PT Start Time 1533   PT Stop Time 1615   PT Time Calculation (min) 42 min   Equipment Utilized During Treatment Gait belt   Activity Tolerance Patient tolerated treatment well   Behavior During Therapy [facility] for tasks assessed/performed      Past Medical History  Diagnosis Date  . Other specified personal history presenting hazards to health(V15.89)   . Personal history of arthritis   . Unspecified personal history presenting hazards to health   . Cramp of limb   . Acute myocardial infarction of other anterior wall, episode of care unspecified   . Personal history of malignant neoplasm of breast   . Unspecified essential hypertension   . Personal history of other diseases of digestive system   . Unspecified hypothyroidism   . Other and unspecified hyperlipidemia   . Stroke [facility]) 03/2015    "left sided issues"  . Coronary artery disease   . Arthritis   . Cancer ([place])     hx of breast lumpectomy     Past Surgical History  Procedure Laterality Date  . Right thyroid lobectomy    . Cataract extraction      left-2006, right 2011  . Intrathoracic goiter resection    . Cholecystectomy    . Lumpectomy for breast cancer Left 80's    radiation therapy  . Back surgery      There were no vitals filed for this visit.  Visit Diagnosis:  Weakness due to cerebrovascular accident  Decreased functional mobility and endurance  Abnormality of  gait  Unsteadiness  Balance problems  Lack of coordination due to stroke  Posture abnormality  Decreased functional activity tolerance      Subjective Assessment - 09/11/15 1530    Subjective Daughter & patient requested assistance with how to sit on bottom of tub for a bath.    Currently in Pain? No/denies                         [facility] Adult PT Treatment/Exercise - 09/11/15 1530    Ambulation/Gait   Ambulation/Gait Yes   Ambulation/Gait Assistance 5: Supervision   Ambulation/Gait Assistance Details cues on RW control / position within RW, step length & posture   Ambulation Distance (Feet) 400 Feet   Assistive device Rolling walker   Gait Pattern Step-through pattern;Decreased step length - left;Decreased stride length;Decreased dorsiflexion - left;Trunk flexed;Narrow base of support   Ambulation Surface Indoor;Level   Ramp 4: Min assist  RW    Ramp Details (indicate cue type and reason) cues on technique including posture, RW control   Curb 4: Min assist  RW   Curb Details (indicate cue type and reason) cues on technique including moving RW up/down and LE position for safety   Neuro Re-ed    Neuro Re-ed Details  Balance activities in parallel bars with UE assist, tactile, visual & verbal cues: sidestepping, turning 90*, standing on compliant surface  with head turns, static with eyes closed and alternating stepping /tapping cones                PT Education - 09/11/15 1530    Education provided Yes   Education Details PT set-up bathtub and demonstrated transfer to sit on bottom using stool as transition and dtr's body mechanics with assistance   Person(s) Educated Patient;Child(ren)   Methods Explanation;Demonstration;Verbal cues   Comprehension Verbalized understanding;Verbal cues required          PT Short Term Goals - 08/19/15 1236    PT SHORT TERM GOAL #1   Title Patient demonstrates understanding of inital HEP. (Target Date: 08/20/2014)    Baseline 08/19/15: pt returns demo with visual cues, some verbal cues of OTAGO program. Per pt/daughter report she is not complaint with performing it at home.   Status Achieved   PT SHORT TERM GOAL #2   Title Timed Up & Go without device with supervision <18sec. (Target Date: 08/20/2014)   Baseline 08/19/15: 15.38 sec's without AD with supervision.   Status Achieved   PT SHORT TERM GOAL #3   Title Paitent ambulates 300' with rollator walker with supervision. (Target Date: 08/20/2014)   Baseline 08/19/15: met today.   Period Months   Status Achieved           PT Long Term Goals - 08/28/15 1307    PT LONG TERM GOAL #1   Title Berg Balance >45/56 to indicate lower fall risk (Target Date: 09/18/2014)   Baseline 08/27/15: scored 48/56 today   Status Achieved   PT LONG TERM GOAL #2   Title Timed Up & Go without device safely <15 sec. (Target Date: 09/18/2014)   Time 2   Period Months   Status On-going   PT LONG TERM GOAL #3   Title Patient ambulates 400' with rollator walker with supervision for community mobility. (Target Date: 09/18/2014)   Time 2   Period Months   Status On-going   PT LONG TERM GOAL #4   Title Patient ambulates 100' around furniture with LRAD modified independent. (Target Date: 09/18/2014)   Time 2   Status On-going   PT LONG TERM GOAL #5   Title Patient negotiates ramps, curbs with rollator walker and stairs with 1 rail with supervision. (Target Date: 09/18/2014)   Time 2   Period Months   Status On-going               Plan - 09/11/15 1615    Clinical Impression Statement Patient's daughter verbalized understanding of tub transfer. Patient improved balance reactions with activities with PT instruction. Patient appears will be ready for discharge next week using RW with daughter's supervision for community outings.    Pt will benefit from skilled therapeutic intervention in order to improve on the following deficits Abnormal gait;Decreased activity  tolerance;Decreased balance;Decreased endurance;Decreased mobility;Decreased strength;Postural dysfunction;Decreased safety awareness   Rehab Potential Good   PT Frequency 2x / week   PT Duration 8 weeks   PT Treatment/Interventions ADLs/Thier Care Home Management;DME Instruction;Gait training;Stair training;Functional mobility training;Therapeutic activities;Therapeutic exercise;Balance training;Neuromuscular re-education;Patient/family education   PT Next Visit Plan  Continue toward LTG's.   Consulted and Agree with Plan of Care Patient;Family member/caregiver   Family Member Consulted pt's dtr        Problem List Patient Active Problem List   Diagnosis Date Noted  . Vertigo 09/03/2015  . Dehydration 09/03/2015  . UTI (lower urinary tract infection) 09/03/2015  . Prolonged Q-T interval on ECG  09/03/2015  . Left-sided neglect 07/01/2015  . History of right MCA stroke 07/01/2015  . Altered sensation due to stroke 04/29/2015  . Cognitive deficit, post-stroke 04/29/2015  . Pseudogout of left wrist 03/28/2015  . Hyperlipidemia LDL goal <70 03/26/2015  . Hyperglycemia 03/26/2015  . 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 Embolic stroke ([place])   . Left hemiplegia ([place])   . Homonymous hemianopia   . Chest wall trauma 01/11/2014  . Injury of left lower arm 01/11/2014  . Headache(784.0) 01/04/2013  . Allergic rhinitis, cause unspecified 01/04/2013  . Anemia 04/19/2012  . Epistaxis, recurrent 04/19/2012  . Hypothyroidism 01/01/2008  . Essential hypertension 01/01/2008  . HYPERTENSION 01/01/2008  . MYOCARDIAL INFARCTION, ACUTE, ANTERIOR WALL 01/01/2008  . LEG CRAMPS 01/01/2008  . ADENOCARCINOMA, BREAST, HX OF 01/01/2008  . DIVERTICULOSIS, COLON, HX OF 01/01/2008  . ARTHRITIS, HX OF 01/01/2008  . RADIATION THERAPY, HX OF 01/01/2008  . CATARACT EXTRACTION, HX OF 01/01/2008  . CHOLECYSTECTOMY, LAPAROSCOPIC, HX OF 01/01/2008    Jamey Reas PT,  DPT 09/12/2015, 7:18 AM  [facility] [address] [place], [place], [address] Phone: [phone]   Fax:  [phone]  Name: Leah Figueroa MRN: [id number] Date of Birth: 04-01-1923

## 2015-09-15 ENCOUNTER — Ambulatory Visit: Payer: Medicare Other | Admitting: Physical Therapy

## 2015-09-15 ENCOUNTER — Encounter: Payer: Self-pay | Admitting: Physical Therapy

## 2015-09-15 ENCOUNTER — Ambulatory Visit: Payer: Medicare Other | Admitting: Occupational Therapy

## 2015-09-15 DIAGNOSIS — R269 Unspecified abnormalities of gait and mobility: Secondary | ICD-10-CM

## 2015-09-15 DIAGNOSIS — R6889 Other general symptoms and signs: Secondary | ICD-10-CM | POA: Diagnosis not present

## 2015-09-15 DIAGNOSIS — IMO0002 Reserved for concepts with insufficient information to code with codable children: Secondary | ICD-10-CM

## 2015-09-15 DIAGNOSIS — R29818 Other symptoms and signs involving the nervous system: Secondary | ICD-10-CM | POA: Diagnosis not present

## 2015-09-15 DIAGNOSIS — Z7409 Other reduced mobility: Secondary | ICD-10-CM

## 2015-09-15 DIAGNOSIS — R531 Weakness: Secondary | ICD-10-CM | POA: Diagnosis not present

## 2015-09-15 DIAGNOSIS — I69898 Other sequelae of other cerebrovascular disease: Secondary | ICD-10-CM | POA: Diagnosis not present

## 2015-09-15 DIAGNOSIS — R2689 Other abnormalities of gait and mobility: Secondary | ICD-10-CM

## 2015-09-15 DIAGNOSIS — R2681 Unsteadiness on feet: Secondary | ICD-10-CM | POA: Diagnosis not present

## 2015-09-16 NOTE — Therapy (Signed)
[facility] [address] [place], [place], [address] Phone: [phone]   Fax:  [phone]  Physical Therapy Treatment  Patient Details  Name: Leah Figueroa MRN: [id number] Date of Birth: 08/18/1922 Referring Provider: Andrew Kirsteins, MD  Encounter Date: 09/15/2015      PT End of Session - 09/15/15 1321    Visit Number 13   Number of Visits 16   Date for PT Re-Evaluation 09/19/15   Authorization Type Medicare G-Code & progress note every 10 visits   PT Start Time 1318   PT Stop Time 1359   PT Time Calculation (min) 41 min   Equipment Utilized During Treatment Gait belt   Activity Tolerance Patient tolerated treatment well   Behavior During Therapy WFL for tasks assessed/performed      Past Medical History  Diagnosis Date  . Other specified personal history presenting hazards to health(V15.89)   . Personal history of arthritis   . Unspecified personal history presenting hazards to health   . Cramp of limb   . Acute myocardial infarction of other anterior wall, episode of care unspecified   . Personal history of malignant neoplasm of breast   . Unspecified essential hypertension   . Personal history of other diseases of digestive system   . Unspecified hypothyroidism   . Other and unspecified hyperlipidemia   . Stroke (HCC) 03/2015    "left sided issues"  . Coronary artery disease   . Arthritis   . Cancer (HCC)     hx of breast lumpectomy     Past Surgical History  Procedure Laterality Date  . Right thyroid lobectomy    . Cataract extraction      left-2006, right 2011  . Intrathoracic goiter resection    . Cholecystectomy    . Lumpectomy for breast cancer Left 80's    radiation therapy  . Back surgery      There were no vitals filed for this visit.  Visit Diagnosis:  Weakness due to cerebrovascular accident  Decreased functional mobility and endurance  Abnormality of  gait  Unsteadiness  Balance problems  Decreased functional activity tolerance  Lack of coordination due to stroke      Subjective Assessment - 09/15/15 1320    Subjective No new complaints. No pain or falls to report.   Patient is accompained by: Family member  daughter   Diagnostic tests She wants to be able to be left alone.    Currently in Pain? No/denies   Pain Score 0-No pain           [facility] Adult PT Treatment/Exercise - 09/15/15 1322    Ambulation/Gait   Ambulation/Gait Yes   Ambulation/Gait Assistance 5: Supervision   Ambulation/Gait Assistance Details occasional cues for walker position, mostly with turns and obstacle negotiation   Ambulation Distance (Feet) 545 Feet   Assistive device Rolling walker   Gait Pattern Step-through pattern;Decreased step length - left;Decreased stride length;Decreased dorsiflexion - left;Trunk flexed;Narrow base of support   Ambulation Surface Level;Indoor   Stairs Yes   Stairs Assistance 5: Supervision   Stair Management Technique One rail Right;Alternating pattern;Step to pattern;Forwards   Number of Stairs 4   Ramp Other (comment)  min guard assist with RW   Ramp Details (indicate cue type and reason) cues to slow down and for walker use/proximity with ramp   Curb Other (comment)  min guard assist with RW   Berg Balance Test   Sit to Stand Able to stand   without using hands and stabilize independently   Standing Unsupported Able to stand safely 2 minutes   Sitting with Back Unsupported but Feet Supported on Floor or Stool Able to sit safely and securely 2 minutes   Stand to Sit Sits safely with minimal use of hands   Transfers Able to transfer safely, minor use of hands   Standing Unsupported with Eyes Closed Able to stand 10 seconds safely   Standing Ubsupported with Feet Together Able to place feet together independently and stand for 1 minute with supervision   From Standing, Reach Forward with Outstretched Arm Can reach  forward >12 cm safely (5")  7 inches   From Standing Position, Pick up Object from Floor Able to pick up shoe, needs supervision   From Standing Position, Turn to Look Behind Over each Shoulder Looks behind one side only/other side shows less weight shift  left>right side   Turn 360 Degrees Able to turn 360 degrees safely but slowly  > 7 seconds each way   Standing Unsupported, Alternately Place Feet on Step/Stool Able to stand independently and safely and complete 8 steps in 20 seconds  13.65 sec's   Standing Unsupported, One Foot in Front Able to place foot tandem independently and hold 30 seconds   Standing on One Leg Able to lift leg independently and hold 5-10 seconds   Total Score 49   Timed Up and Go Test   TUG Normal TUG   Normal TUG (seconds) 18.28  no device; 25.68 with walker            PT Short Term Goals - 08/19/15 1236    PT SHORT TERM GOAL #1   Title Patient demonstrates understanding of inital HEP. (Target Date: 08/20/2014)   Baseline 08/19/15: pt returns demo with visual cues, some verbal cues of OTAGO program. Per pt/daughter report she is not complaint with performing it at home.   Status Achieved   PT SHORT TERM GOAL #2   Title Timed Up & Go without device with supervision <18sec. (Target Date: 08/20/2014)   Baseline 08/19/15: 15.38 sec's without AD with supervision.   Status Achieved   PT SHORT TERM GOAL #3   Title Paitent ambulates 300' with rollator walker with supervision. (Target Date: 08/20/2014)   Baseline 08/19/15: met today.   Period Months   Status Achieved           PT Long Term Goals - 09/15/15 1321    PT LONG TERM GOAL #1   Title Berg Balance >45/56 to indicate lower fall risk (Target Date: 09/18/2014)   Baseline 08/27/15: scored 48/56 today; rechecked on 09/15/15: 49/53   Status Achieved   PT LONG TERM GOAL #2   Title Timed Up & Go without device safely <15 sec. (Target Date: 09/18/2014)   Baseline 09/15/15: 18.28 no AD, 25.68 with RW   Time --    Period --   Status Not Met   PT LONG TERM GOAL #3   Title Patient ambulates 400' with rollator walker with supervision for community mobility. (Target Date: 09/18/2014)   Baseline met on 09/15/15   Time --   Period --   Status Achieved   PT LONG TERM GOAL #4   Title Patient ambulates 100' around furniture with LRAD modified independent. (Target Date: 09/18/2014)   Time 2   Period Months   Status On-going   PT LONG TERM GOAL #5   Title Patient negotiates ramps, curbs with rollator walker and stairs with 1 rail   with supervision. (Target Date: 09/18/2014)   Baseline 09/15/15: met for stair negotiation, still min guard with ramp/curb   Time --   Period --   Status Partially Met           Plan - 09/15/15 1321    Clinical Impression Statement Pt has met 2 LTGs to date and is progressing toward others.     Pt will benefit from skilled therapeutic intervention in order to improve on the following deficits Abnormal gait;Decreased activity tolerance;Decreased balance;Decreased endurance;Decreased mobility;Decreased strength;Postural dysfunction;Decreased safety awareness   Rehab Potential Good   PT Frequency 2x / week   PT Duration 8 weeks   PT Treatment/Interventions ADLs/S[facility] Care Home Management;DME Instruction;Gait training;Stair training;Functional mobility training;Therapeutic activities;Therapeutic exercise;Balance training;Neuromuscular re-education;Patient/family education   PT Next Visit Plan Check remaining LTGs for discharge   Consulted and Agree with Plan of Care Patient;Family member/caregiver   Family Member Consulted pt's dtr        Problem List Patient Active Problem List   Diagnosis Date Noted  . Vertigo 09/03/2015  . Dehydration 09/03/2015  . UTI (lower urinary tract infection) 09/03/2015  . Prolonged Q-T interval on ECG 09/03/2015  . Left-sided neglect 07/01/2015  . History of right MCA stroke 07/01/2015  . Altered sensation due to stroke 04/29/2015  . Cognitive  deficit, post-stroke 04/29/2015  . Pseudogout of left wrist 03/28/2015  . Hyperlipidemia LDL goal <70 03/26/2015  . Hyperglycemia 03/26/2015  . 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 Embolic stroke ([place])   . Left hemiplegia ([place])   . Homonymous hemianopia   . Chest wall trauma 01/11/2014  . Injury of left lower arm 01/11/2014  . Headache(784.0) 01/04/2013  . Allergic rhinitis, cause unspecified 01/04/2013  . Anemia 04/19/2012  . Epistaxis, recurrent 04/19/2012  . Hypothyroidism 01/01/2008  . Essential hypertension 01/01/2008  . HYPERTENSION 01/01/2008  . MYOCARDIAL INFARCTION, ACUTE, ANTERIOR WALL 01/01/2008  . LEG CRAMPS 01/01/2008  . ADENOCARCINOMA, BREAST, HX OF 01/01/2008  . DIVERTICULOSIS, COLON, HX OF 01/01/2008  . ARTHRITIS, HX OF 01/01/2008  . RADIATION THERAPY, HX OF 01/01/2008  . CATARACT EXTRACTION, HX OF 01/01/2008  . CHOLECYSTECTOMY, LAPAROSCOPIC, HX OF 01/01/2008    Willow Ora 09/16/2015, 4:53 PM  Willow Ora, PTA, [facility] [address] [place], [place] [address] [phone] 09/16/2015, 4:53 PM   Name: PRESLYN WARR MRN: [id number] Date of Birth: 1922/11/10

## 2015-09-18 ENCOUNTER — Ambulatory Visit: Payer: Medicare Other | Attending: Physical Medicine & Rehabilitation | Admitting: Occupational Therapy

## 2015-09-18 ENCOUNTER — Ambulatory Visit: Payer: Medicare Other | Admitting: Physical Therapy

## 2015-09-18 ENCOUNTER — Encounter: Payer: Self-pay | Admitting: Physical Therapy

## 2015-09-18 ENCOUNTER — Encounter: Payer: Self-pay | Admitting: Occupational Therapy

## 2015-09-18 DIAGNOSIS — R293 Abnormal posture: Secondary | ICD-10-CM

## 2015-09-18 DIAGNOSIS — Z7409 Other reduced mobility: Secondary | ICD-10-CM

## 2015-09-18 DIAGNOSIS — I69898 Other sequelae of other cerebrovascular disease: Secondary | ICD-10-CM | POA: Insufficient documentation

## 2015-09-18 DIAGNOSIS — R2689 Other abnormalities of gait and mobility: Secondary | ICD-10-CM

## 2015-09-18 DIAGNOSIS — I69998 Other sequelae following unspecified cerebrovascular disease: Secondary | ICD-10-CM | POA: Diagnosis not present

## 2015-09-18 DIAGNOSIS — R531 Weakness: Secondary | ICD-10-CM | POA: Diagnosis not present

## 2015-09-18 DIAGNOSIS — R269 Unspecified abnormalities of gait and mobility: Secondary | ICD-10-CM | POA: Insufficient documentation

## 2015-09-18 DIAGNOSIS — R29818 Other symptoms and signs involving the nervous system: Secondary | ICD-10-CM | POA: Diagnosis not present

## 2015-09-18 DIAGNOSIS — H539 Unspecified visual disturbance: Secondary | ICD-10-CM | POA: Diagnosis not present

## 2015-09-18 DIAGNOSIS — IMO0002 Reserved for concepts with insufficient information to code with codable children: Secondary | ICD-10-CM

## 2015-09-18 DIAGNOSIS — R279 Unspecified lack of coordination: Secondary | ICD-10-CM | POA: Diagnosis not present

## 2015-09-18 DIAGNOSIS — I6980 Unspecified sequelae of other cerebrovascular disease: Secondary | ICD-10-CM | POA: Insufficient documentation

## 2015-09-18 DIAGNOSIS — R6889 Other general symptoms and signs: Secondary | ICD-10-CM

## 2015-09-18 DIAGNOSIS — R2681 Unsteadiness on feet: Secondary | ICD-10-CM

## 2015-09-18 DIAGNOSIS — R4189 Other symptoms and signs involving cognitive functions and awareness: Secondary | ICD-10-CM | POA: Diagnosis not present

## 2015-09-18 DIAGNOSIS — I69398 Other sequelae of cerebral infarction: Secondary | ICD-10-CM

## 2015-09-18 NOTE — Therapy (Signed)
[facility] [address] [place], [place], [address] Phone: [phone]   Fax:  [phone]  Occupational Therapy Treatment  Patient Details  Name: Leah Figueroa MRN: [id number] Date of Birth: March 07, 1923 Referring Provider: Dr. Alysia Penna  Encounter Date: 09/18/2015      OT End of Session - 09/18/15 1749    Visit Number 13   Number of Visits 17   Date for OT Re-Evaluation 09/20/15   Authorization Type MCR - G code needed   Authorization - Visit Number 13   Authorization - Number of Visits 20   OT Start Time 1315   OT Stop Time 1400   OT Time Calculation (min) 45 min   Activity Tolerance Patient tolerated treatment well      Past Medical History  Diagnosis Date  . Other specified personal history presenting hazards to health(V15.89)   . Personal history of arthritis   . Unspecified personal history presenting hazards to health   . Cramp of limb   . Acute myocardial infarction of other anterior wall, episode of care unspecified   . Personal history of malignant neoplasm of breast   . Unspecified essential hypertension   . Personal history of other diseases of digestive system   . Unspecified hypothyroidism   . Other and unspecified hyperlipidemia   . Stroke [facility]) 03/2015    "left sided issues"  . Coronary artery disease   . Arthritis   . Cancer ([place])     hx of breast lumpectomy     Past Surgical History  Procedure Laterality Date  . Right thyroid lobectomy    . Cataract extraction      left-2006, right 2011  . Intrathoracic goiter resection    . Cholecystectomy    . Lumpectomy for breast cancer Left 80's    radiation therapy  . Back surgery      There were no vitals filed for this visit.  Visit Diagnosis:  Weakness due to cerebrovascular accident  Decreased functional mobility and endurance  Lack of coordination due to stroke  Vision disturbance following cerebrovascular  accident  Impaired cognition      Subjective Assessment - 09/18/15 1317    Patient is accompained by: Family member   Pertinent History back surgery, breast CA   Limitations HOH   Patient Stated Goals to drop less from Lt hand   Currently in Pain? No/denies                      OT Treatments/Exercises (OP) - 09/18/15 0001    ADLs   Functional Mobility Practiced retrieving light items out of cabinets with Lt hand with cues for safety with walker and Lt hand placement when replacing Lt hand on walker for safety. Negotiating around obstacles in gym with cues to attend to Lt side.    ADL Comments Caregiver education provided for carryover of safety techniques. Also recommended making instructions simple and part of daily routine to encourage carry over and for memory   Fine Motor Coordination   Small Pegboard Copying peg design with Lt hand for coordination and design placed on Lt side for visual scanning. Pt with moderate drops Lt hand and copied simple design with one error (off by one row) but unable to completely finish design due to time constraints                  OT Short Term Goals - 09/18/15 1749    OT  SHORT TERM GOAL #1   Title Indepndent with HEP for Lt hand coordination and strength (all STG's due 08/21/15)    Time 4   Period Weeks   Status Partially Met  08/21/15: Cueing required to perform correctly   OT SHORT TERM GOAL #2   Title Pt to verbalize understanding with visual strategies for Lt inattention and safety considerations with impaired sensation LUE   Time 4   Period Weeks   Status Partially Met  issued and educated pt/daughter, but pt requires cues to implement   OT SHORT TERM GOAL #3   Title Indpependent with all BADLS   Baseline 9/32/35- pt needs mod A per daughter   Time 4   Period Weeks   Status Not Met  min assist required d/t cognitive deficits   OT SHORT TERM GOAL #4   Title Improve grip strength Lt hand to 25 lbs or greater to  assist with opening containers   Baseline 08/18/14- 25 lbs   Time 4   Period Weeks   Status Achieved   OT SHORT TERM GOAL #5   Title Improve coordination as evidenced by performing 9 hole peg test in 75 sec. or less Lt hand   Baseline 08/18/14-64.47   Time 4   Period Weeks   Status Achieved           OT Long Term Goals - 10-17-15 1752    OT LONG TERM GOAL #1   Title Independent with updated HEP (ALL LTG's due 09/20/15)   Time 8   Period Weeks   Status Deferred   OT LONG TERM GOAL #2   Title Independent with retrieving snack and drink from kitchen with DME prn   Baseline dependent   Time 8   Period Weeks   Status Not Met  requires supervision for safety   OT LONG TERM GOAL #3   Title Improve coordination as evidenced by performing 9 hole peg test in 60 sec. or under Lt hand   Baseline 93.37 sec   Time 8   Period Weeks   Status Achieved  08/27/15: 53.65 sec, 09/09/15: 50.78 sec   OT LONG TERM GOAL #4   Title Pt to perform enviornmental scanning with 90% accuracy or greater   Time 8   Period Weeks   Status Not Met   OT LONG TERM GOAL #5   Title Pt to return to simple financial management and medication management with supervision/cueing prn for accuracy/safety   Time 8   Period Weeks   Status Deferred  Pt's daughter has taken over financial management due to cognitive deficits               Plan - October 17, 2015 1755    Clinical Impression Statement Pt with slightly increased memory today for proper hand placement on walker but still required initial cueing to perform correctly. However, pt able to carryover better. Pt still limited by decreased safety awareness, attention to Lt side, and cognitive deficits   Plan D/C O.Donnajean Lopes and Agree with Plan of Care Patient;Family member/caregiver   Family Member Consulted daughter          G-Codes - 2015/10/17 1319    Functional Assessment Tool Used LUE: 9 hole peg test = 50.78 sec. , grip strength = 25 lbs. avg    Carrying, Moving and Handling Objects Current Status [phone]) At least 20 percent but less than 40 percent impaired, limited or restricted   Carrying, Moving and Handling Objects  Goal Status ([id number]) At least 20 percent but less than 40 percent impaired, limited or restricted      Problem List Patient Active Problem List   Diagnosis Date Noted  . Vertigo 09/03/2015  . Dehydration 09/03/2015  . UTI (lower urinary tract infection) 09/03/2015  . Prolonged Q-T interval on ECG 09/03/2015  . Left-sided neglect 07/01/2015  . History of right MCA stroke 07/01/2015  . Altered sensation due to stroke 04/29/2015  . Cognitive deficit, post-stroke 04/29/2015  . Pseudogout of left wrist 03/28/2015  . Hyperlipidemia LDL goal <70 03/26/2015  . Hyperglycemia 03/26/2015  . 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 Embolic stroke ([place])   . Left hemiplegia ([place])   . Homonymous hemianopia   . Chest wall trauma 01/11/2014  . Injury of left lower arm 01/11/2014  . Headache(784.0) 01/04/2013  . Allergic rhinitis, cause unspecified 01/04/2013  . Anemia 04/19/2012  . Epistaxis, recurrent 04/19/2012  . Hypothyroidism 01/01/2008  . Essential hypertension 01/01/2008  . HYPERTENSION 01/01/2008  . MYOCARDIAL INFARCTION, ACUTE, ANTERIOR WALL 01/01/2008  . LEG CRAMPS 01/01/2008  . ADENOCARCINOMA, BREAST, HX OF 01/01/2008  . DIVERTICULOSIS, COLON, HX OF 01/01/2008  . ARTHRITIS, HX OF 01/01/2008  . RADIATION THERAPY, HX OF 01/01/2008  . CATARACT EXTRACTION, HX OF 01/01/2008  . CHOLECYSTECTOMY, LAPAROSCOPIC, HX OF 01/01/2008    OCCUPATIONAL THERAPY DISCHARGE SUMMARY  Visits from Start of Care: 13  Current functional level related to goals / functional outcomes: SEE ABOVE   Remaining deficits: Cognition including safety awareness and memory deficits Attention to Lt side Coordination Lt hand   Education / Equipment: HEP, safety considerations/tips, fall prevention   Plan: Patient agrees to discharge.  Patient goals were partially met. Patient is being discharged due to lack of progress.  ?????       Carey Bullocks, OTR/L 09/18/2015, 5:57 PM  [facility] [address] [place], [place], [address] Phone: [phone]   Fax:  [phone]  Name: Leah Figueroa MRN: [id number] Date of Birth: 1922/10/31

## 2015-09-19 NOTE — Therapy (Signed)
[facility] [address] [place], [place], [address] Phone: [phone]   Fax:  [phone]  Physical Therapy Treatment  Patient Details  Name: Leah Figueroa MRN: [id number] Date of Birth: 04-Jan-1923 Referring Provider: Alysia Penna, MD  Encounter Date: 09/18/2015  PHYSICAL THERAPY DISCHARGE SUMMARY  Visits from Start of Care: 14  Current functional level related to goals / functional outcomes: See below   Remaining deficits: See below   Education / Equipment: HEP & fall prevention strategies Plan: Patient agrees to discharge.  Patient goals were partially met. Patient is being discharged due to meeting the stated rehab goals.  ?????          PT End of Session - 09/18/15 1530    Visit Number 14   Number of Visits 16   Date for PT Re-Evaluation 09/19/15   Authorization Type Medicare G-Code & progress note every 10 visits   PT Start Time 1402   PT Stop Time 1445   PT Time Calculation (min) 43 min   Equipment Utilized During Treatment Gait belt   Activity Tolerance Patient tolerated treatment well   Behavior During Therapy WFL for tasks assessed/performed      Past Medical History  Diagnosis Date  . Other specified personal history presenting hazards to health(V15.89)   . Personal history of arthritis   . Unspecified personal history presenting hazards to health   . Cramp of limb   . Acute myocardial infarction of other anterior wall, episode of care unspecified   . Personal history of malignant neoplasm of breast   . Unspecified essential hypertension   . Personal history of other diseases of digestive system   . Unspecified hypothyroidism   . Other and unspecified hyperlipidemia   . Stroke [facility]) 03/2015    "left sided issues"  . Coronary artery disease   . Arthritis   . Cancer ([place])     hx of breast lumpectomy     Past Surgical History  Procedure Laterality Date  . Right thyroid lobectomy     . Cataract extraction      left-2006, right 2011  . Intrathoracic goiter resection    . Cholecystectomy    . Lumpectomy for breast cancer Left 80's    radiation therapy  . Back surgery      There were no vitals filed for this visit.  Visit Diagnosis:  Weakness due to cerebrovascular accident  Decreased functional mobility and endurance  Lack of coordination due to stroke  Abnormality of gait  Unsteadiness  Balance problems  Decreased functional activity tolerance  Posture abnormality      Subjective Assessment - 09/18/15 1406    Subjective Dtr bought frame for bed that lowered bed and now her feet touch floor when sitting on edge of bed.    Currently in Pain? No/denies                         [facility] Adult PT Treatment/Exercise - 09/18/15 1400    Ambulation/Gait   Ambulation/Gait Yes   Ambulation/Gait Assistance 5: Supervision   Ambulation Distance (Feet) 600 Feet  100' around furniture with distant supervision, 600' & 500'   Assistive device Rolling walker   Gait Pattern Step-through pattern;Decreased step length - left;Decreased stride length;Decreased dorsiflexion - left;Trunk flexed;Narrow base of support   Ambulation Surface Indoor;Level;Outdoor;Paved;Grass   Stairs Yes   Stairs Assistance 5: Supervision   Stair Management Technique One rail Right;Alternating pattern;Step to  pattern;Forwards   Number of Stairs 4   Ramp 5: Supervision  RW   Curb 5: Supervision  RW   Berg Balance Test   Sit to Stand Able to stand without using hands and stabilize independently   Standing Unsupported Able to stand safely 2 minutes   Sitting with Back Unsupported but Feet Supported on Floor or Stool Able to sit safely and securely 2 minutes   Stand to Sit Sits safely with minimal use of hands   Transfers Able to transfer safely, minor use of hands   Standing Unsupported with Eyes Closed Able to stand 10 seconds safely   Standing Ubsupported with Feet  Together Able to place feet together independently and stand 1 minute safely   From Standing, Reach Forward with Outstretched Arm Can reach confidently >25 cm (10")  7 inches   From Standing Position, Pick up Object from Floor Able to pick up shoe safely and easily   From Standing Position, Turn to Look Behind Over each Shoulder Looks behind one side only/other side shows less weight shift  left>right side   Turn 360 Degrees Able to turn 360 degrees safely but slowly  > 7 seconds each way   Standing Unsupported, Alternately Place Feet on Step/Stool Able to stand independently and safely and complete 8 steps in 20 seconds  13.65 sec's   Standing Unsupported, One Foot in Front Able to plae foot ahead of the other independently and hold 30 seconds   Standing on One Leg Able to lift leg independently and hold equal to or more than 3 seconds   Total Score 50   Timed Up and Go Test   TUG --   Normal TUG (seconds) --                PT Education - 09/18/15 1445    Education provided Yes   Education Details ongoing HEP   Person(s) Educated Patient;Child(ren)   Methods Explanation   Comprehension Verbalized understanding          PT Short Term Goals - 08/19/15 1236    PT SHORT TERM GOAL #1   Title Patient demonstrates understanding of inital HEP. (Target Date: 08/20/2014)   Baseline 08/19/15: pt returns demo with visual cues, some verbal cues of OTAGO program. Per pt/daughter report she is not complaint with performing it at home.   Status Achieved   PT SHORT TERM GOAL #2   Title Timed Up & Go without device with supervision <18sec. (Target Date: 08/20/2014)   Baseline 08/19/15: 15.38 sec's without AD with supervision.   Status Achieved   PT SHORT TERM GOAL #3   Title Paitent ambulates 300' with rollator walker with supervision. (Target Date: 08/20/2014)   Baseline 08/19/15: met today.   Period Months   Status Achieved           PT Long Term Goals - 09/18/15 1445    PT LONG TERM  GOAL #1   Title Berg Balance >45/56 to indicate lower fall risk (Target Date: 09/18/2014)   Baseline MET 09/17/2105 Merrilee Jansky Balance 50/56   Status Achieved   PT LONG TERM GOAL #2   Title Timed Up & Go without device safely <15 sec. (Target Date: 09/18/2014)   Baseline 09/15/15: 18.28 no AD, 25.68 with RW   Status Not Met   PT LONG TERM GOAL #3   Title Patient ambulates 400' with rollator walker with supervision for community mobility. (Target Date: 09/18/2014)   Baseline met on 09/15/15  Status Achieved   PT LONG TERM GOAL #4   Title Patient ambulates 100' around furniture with LRAD modified independent. (Target Date: 09/18/2014)   Baseline Partially MET 2/2/017   Time 2   Period Months   Status Partially Met   PT LONG TERM GOAL #5   Title Patient negotiates ramps, curbs with rollator walker and stairs with 1 rail with supervision. (Target Date: 09/18/2014)   Baseline MET 2015/09/25   Status Achieved               Plan - 09-25-15 1445    Clinical Impression Statement Patient met or partially met 4 of 5 LTGs. Timed Up & Go not met as slower with RW and RW recommended for all gait to decrease fall risk. Patient and daughter report increased activity level & function.   Pt will benefit from skilled therapeutic intervention in order to improve on the following deficits Abnormal gait;Decreased activity tolerance;Decreased balance;Decreased endurance;Decreased mobility;Decreased strength;Postural dysfunction;Decreased safety awareness   Rehab Potential Good   PT Frequency 2x / week   PT Duration 8 weeks   PT Treatment/Interventions ADLs/[facility];DME Instruction;Gait training;Stair training;Functional mobility training;Therapeutic activities;Therapeutic exercise;Balance training;Neuromuscular re-education;Patient/family education   PT Next Visit Plan discharge   Consulted and Agree with Plan of Care Patient;Family member/caregiver   Family Member Consulted pt's dtr           G-Codes - 09/25/2015 1445    Functional Assessment Tool Used Clinical Judgement   Functional Limitation Mobility: Walking and moving around   Mobility: Walking and Moving Around Goal Status [phone]) At least 40 percent but less than 60 percent impaired, limited or restricted   Mobility: Walking and Moving Around Discharge Status [phone]) At least 40 percent but less than 60 percent impaired, limited or restricted      Problem List Patient Active Problem List   Diagnosis Date Noted  . Vertigo 09/03/2015  . Dehydration 09/03/2015  . UTI (lower urinary tract infection) 09/03/2015  . Prolonged Q-T interval on ECG 09/03/2015  . Left-sided neglect 07/01/2015  . History of right MCA stroke 07/01/2015  . Altered sensation due to stroke 04/29/2015  . Cognitive deficit, post-stroke 04/29/2015  . Pseudogout of left wrist 03/28/2015  . Hyperlipidemia LDL goal <70 03/26/2015  . Hyperglycemia 03/26/2015  . 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 Embolic stroke ([place])   . Left hemiplegia ([place])   . Homonymous hemianopia   . Chest wall trauma 01/11/2014  . Injury of left lower arm 01/11/2014  . Headache(784.0) 01/04/2013  . Allergic rhinitis, cause unspecified 01/04/2013  . Anemia 04/19/2012  . Epistaxis, recurrent 04/19/2012  . Hypothyroidism 01/01/2008  . Essential hypertension 01/01/2008  . HYPERTENSION 01/01/2008  . MYOCARDIAL INFARCTION, ACUTE, ANTERIOR WALL 01/01/2008  . LEG CRAMPS 01/01/2008  . ADENOCARCINOMA, BREAST, HX OF 01/01/2008  . DIVERTICULOSIS, COLON, HX OF 01/01/2008  . ARTHRITIS, HX OF 01/01/2008  . RADIATION THERAPY, HX OF 01/01/2008  . CATARACT EXTRACTION, HX OF 01/01/2008  . CHOLECYSTECTOMY, LAPAROSCOPIC, HX OF 01/01/2008    Jamey Reas PT, DPT 09/19/2015, 3:14 PM  [facility] [address] [place], [place], [address] Phone: [phone]   Fax:  [phone]  Name:  Lilianah Buffin Crupi MRN: [id number] Date of Birth: 12-16-1922

## 2015-10-20 ENCOUNTER — Other Ambulatory Visit (HOSPITAL_COMMUNITY): Payer: Self-pay | Admitting: Geriatric Medicine

## 2015-10-20 DIAGNOSIS — I69359 Hemiplegia and hemiparesis following cerebral infarction affecting unspecified side: Secondary | ICD-10-CM | POA: Diagnosis not present

## 2015-10-20 DIAGNOSIS — I10 Essential (primary) hypertension: Secondary | ICD-10-CM | POA: Diagnosis not present

## 2015-10-20 DIAGNOSIS — R05 Cough: Secondary | ICD-10-CM | POA: Diagnosis not present

## 2015-10-23 ENCOUNTER — Encounter: Payer: Medicare Other | Attending: Physical Medicine & Rehabilitation

## 2015-10-23 ENCOUNTER — Ambulatory Visit (HOSPITAL_BASED_OUTPATIENT_CLINIC_OR_DEPARTMENT_OTHER): Payer: Medicare Other | Admitting: Physical Medicine & Rehabilitation

## 2015-10-23 ENCOUNTER — Encounter: Payer: Self-pay | Admitting: Physical Medicine & Rehabilitation

## 2015-10-23 ENCOUNTER — Ambulatory Visit (HOSPITAL_COMMUNITY)
Admission: RE | Admit: 2015-10-23 | Discharge: 2015-10-23 | Disposition: A | Discharge status: Home / Self Care | Payer: Medicare Other | Source: Ambulatory Visit | Attending: Physical Medicine & Rehabilitation | Admitting: Physical Medicine & Rehabilitation

## 2015-10-23 DIAGNOSIS — G8190 Hemiplegia, unspecified affecting unspecified side: Secondary | ICD-10-CM | POA: Diagnosis not present

## 2015-10-23 DIAGNOSIS — R414 Neurologic neglect syndrome: Secondary | ICD-10-CM | POA: Diagnosis not present

## 2015-10-23 DIAGNOSIS — R208 Other disturbances of skin sensation: Secondary | ICD-10-CM | POA: Insufficient documentation

## 2015-10-23 DIAGNOSIS — R1310 Dysphagia, unspecified: Secondary | ICD-10-CM | POA: Diagnosis not present

## 2015-10-23 DIAGNOSIS — M25512 Pain in left shoulder: Secondary | ICD-10-CM | POA: Insufficient documentation

## 2015-10-23 DIAGNOSIS — I10 Essential (primary) hypertension: Secondary | ICD-10-CM | POA: Insufficient documentation

## 2015-10-23 DIAGNOSIS — Z8673 Personal history of transient ischemic attack (TIA), and cerebral infarction without residual deficits: Secondary | ICD-10-CM

## 2015-10-23 DIAGNOSIS — I69398 Other sequelae of cerebral infarction: Secondary | ICD-10-CM | POA: Diagnosis not present

## 2015-10-23 DIAGNOSIS — G8194 Hemiplegia, unspecified affecting left nondominant side: Secondary | ICD-10-CM | POA: Diagnosis not present

## 2015-10-23 DIAGNOSIS — I6931 Attention and concentration deficit following cerebral infarction: Secondary | ICD-10-CM | POA: Diagnosis not present

## 2015-10-23 DIAGNOSIS — E785 Hyperlipidemia, unspecified: Secondary | ICD-10-CM | POA: Insufficient documentation

## 2015-10-23 NOTE — Progress Notes (Signed)
Subjective:    Patient ID: Leah FillerMabel E Bachar, female    DOB: [birth year], 82 y.o.   MRN: [id number] 82 year old female with right MCA infarct in August 2016. Underwent inpatient rehabilitation and was discharged on August 23 to [facility]. She remained at [facility] where she received PT OT and speech therapy until 05/08/2015. She was discharged to her home where her daughter is staying with her [phone] to provide supervision. Patient has been getting home health therapy PTOT speech .Converted outpatient therapy in Dec 2016, PTOT   HPI Patient completed outpatient therapy 09/18/2015. Since my last visit on 08/26/2015 she had a fall resulting in ER visit. She had a laceration to the ear. This has healed. Patient is doing 80% of her dressing independently. She is still walking with a wheeled walker. No further falls. Sleeping okay, poor appetite, Having some problems with coughing while swallowing and is getting further evaluation tomorrow.Modified barium swallow [facility]   Also having left shoulder pain this is mainly at night. She'll take Tylenol before she goes to bed at night to help with this. This started after the fall however may be about 1 month after the fall. No x-rays were taken in the emergency department of the left shoulder.  Patient also has had some swelling in both feet. Her daughters asking whether this could be a medication side effect. We did review her medications, amlodipine could potentially  Echocardiogram after the stroke showed no evidence of systolic dysfunction Pain Inventory Average Pain 0 Pain Right Now 0 My pain is no pain  In the last 24 hours, has pain interfered with the following? General activity 0 Relation with others 0 Enjoyment of life 5 What TIME of day is your pain at its worst? evening Sleep (in general) NA  Pain is worse with: no pain Pain improves with: medication Relief from Meds: no  pain  Mobility walk with assistance use a walker how many minutes can you walk? 60 ability to climb steps?  yes do you drive?  no  Function retired I need assistance with the following:  dressing, bathing, meal prep, household duties and shopping  Neuro/Psych No problems in this area  Prior Studies Any changes since last visit?  no  Physicians involved in your care Any changes since last visit?  no   Family History  Problem Relation Age of Onset  . Colon cancer Mother   . Coronary artery disease Brother   . Breast cancer Mother    Social History   Social History  . Marital Status: Widowed    Spouse Name: N/A  . Number of Children: 1  . Years of Education: 11   Occupational History  . retired    Social History Main Topics  . Smoking status: Never Smoker   . Smokeless tobacco: Never Used  . Alcohol Use: No  . Drug Use: No  . Sexual Activity: No   Other Topics Concern  . None   Social History Narrative   HSG. Married '48- 57 yrs/widowed. 1 daughter - '59; 1 grandchild. 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  Lives alone. I-ADLs, manages books, pays bills.. End-of-Life Care: no heroic measures; No CPR, no mechanical ventilation.  Caffeine use/day:  None. Does Patient Exercise:  No       04/29/15 currently resident [facility]      Past Surgical History  Procedure Laterality Date  . Right thyroid lobectomy    .  Cataract extraction      left-2006, right 2011  . Intrathoracic goiter resection    . Cholecystectomy    . Lumpectomy for breast cancer Left 80's    radiation therapy  . Back surgery     Past Medical History  Diagnosis Date  . Other specified personal history presenting hazards to health(V15.89)   . Personal history of arthritis   . Unspecified personal history presenting hazards to health   . Cramp of limb   . Acute myocardial infarction of other anterior wall, episode of care unspecified   . Personal history of malignant neoplasm  of breast   . Unspecified essential hypertension   . Personal history of other diseases of digestive system   . Unspecified hypothyroidism   . Other and unspecified hyperlipidemia   . Stroke [facility]) 03/2015    "left sided issues"  . Coronary artery disease   . Arthritis   . Cancer ([facility])     hx of breast lumpectomy    There were no vitals taken for this visit.  Opioid Risk Score:   Fall Risk Score:  `1  Depression screen PHQ 2/9  Depression screen [facility] 2/9 10/23/2015 04/29/2015  Decreased Interest 0 2  Down, Depressed, Hopeless 0 3  PHQ - 2 Score 0 5  Altered sleeping - 2  Tired, decreased energy - 0  Change in appetite - 3  Feeling bad or failure about yourself  - 2  Trouble concentrating - 0  Moving slowly or fidgety/restless - 0  Suicidal thoughts - 0  PHQ-9 Score - 12  Difficult doing work/chores - Not difficult at all     Review of Systems  Constitutional: Positive for appetite change.  Cardiovascular: Positive for leg swelling.  All other systems reviewed and are negative.      Objective:   Physical Exam  Constitutional: She appears well-developed and well-nourished.  HENT:  Head: Normocephalic and atraumatic.  Eyes: Conjunctivae and EOM are normal. Pupils are equal, round, and reactive to light.  Neck: Normal range of motion.  Vitals reviewed.  Motor strength is 5/5 in the right deltoid, biceps, triceps, grip, bilateral hip flexors and knee extensors ankle dorsiflexors 4/5 in the left deltoid, biceps, triceps, grip  Negative impingement sign at the shoulder. No tenderness over the trapezius. No tenderness to palpation over the shoulder or the clavicle.  Left neglect noted on double simultaneous stimulation, this is visual and tactile       Assessment & Plan:  1. History of right MCA infarct with some residual left neglect as well as mild left upper extremity fine motor deficits and ataxia and mild left upper extremity weakness. Overall excellent  improvement No further PT OT, continue home exercise program. Continue using the walker to ambulate to avoid falls  2.  Left shoulder pain occurred after a fall however not immediately after. She has nocturnal pain. I think it is worthwhile to get an x-ray. If patient has continued pain which is not relieved by Tylenol at night, I would recommend corticosteroid injection  Return to clinic in 3 months

## 2015-10-23 NOTE — Patient Instructions (Addendum)
Amlodipine may be causing leg swelling if it worsens he may talk to your primary care physician  I have ordered an x-ray of the left shoulder to evaluate pain this [facility] be done [facility]  Please call if you wish to schedule left shoulder injection

## 2015-10-24 ENCOUNTER — Ambulatory Visit (HOSPITAL_COMMUNITY)
Admission: RE | Admit: 2015-10-24 | Discharge: 2015-10-24 | Disposition: A | Discharge status: Home / Self Care | Payer: Medicare Other | Source: Ambulatory Visit | Attending: Geriatric Medicine | Admitting: Geriatric Medicine

## 2015-10-24 ENCOUNTER — Other Ambulatory Visit (HOSPITAL_COMMUNITY): Payer: Self-pay | Admitting: Geriatric Medicine

## 2015-10-24 DIAGNOSIS — M25512 Pain in left shoulder: Secondary | ICD-10-CM | POA: Diagnosis not present

## 2015-10-24 DIAGNOSIS — R1310 Dysphagia, unspecified: Secondary | ICD-10-CM

## 2015-10-24 DIAGNOSIS — T17320D Food in larynx causing asphyxiation, subsequent encounter: Secondary | ICD-10-CM | POA: Diagnosis not present

## 2015-10-24 DIAGNOSIS — K224 Dyskinesia of esophagus: Secondary | ICD-10-CM | POA: Insufficient documentation

## 2015-11-10 DIAGNOSIS — H35341 Macular cyst, hole, or pseudohole, right eye: Secondary | ICD-10-CM | POA: Diagnosis not present

## 2015-12-15 ENCOUNTER — Other Ambulatory Visit: Payer: Self-pay | Admitting: Internal Medicine

## 2016-01-20 ENCOUNTER — Ambulatory Visit (HOSPITAL_BASED_OUTPATIENT_CLINIC_OR_DEPARTMENT_OTHER): Payer: Medicare Other | Admitting: Physical Medicine & Rehabilitation

## 2016-01-20 ENCOUNTER — Encounter: Payer: Self-pay | Admitting: Physical Medicine & Rehabilitation

## 2016-01-20 ENCOUNTER — Encounter: Payer: Medicare Other | Attending: Physical Medicine & Rehabilitation

## 2016-01-20 VITALS — BP 117/66 | HR 60 | Resp 12

## 2016-01-20 DIAGNOSIS — E785 Hyperlipidemia, unspecified: Secondary | ICD-10-CM | POA: Diagnosis not present

## 2016-01-20 DIAGNOSIS — G8190 Hemiplegia, unspecified affecting unspecified side: Secondary | ICD-10-CM | POA: Diagnosis not present

## 2016-01-20 DIAGNOSIS — I10 Essential (primary) hypertension: Secondary | ICD-10-CM | POA: Insufficient documentation

## 2016-01-20 DIAGNOSIS — I69319 Unspecified symptoms and signs involving cognitive functions following cerebral infarction: Secondary | ICD-10-CM | POA: Diagnosis not present

## 2016-01-20 DIAGNOSIS — R414 Neurologic neglect syndrome: Secondary | ICD-10-CM

## 2016-01-20 DIAGNOSIS — I6931 Attention and concentration deficit following cerebral infarction: Secondary | ICD-10-CM | POA: Insufficient documentation

## 2016-01-20 DIAGNOSIS — Z1389 Encounter for screening for other disorder: Secondary | ICD-10-CM | POA: Diagnosis not present

## 2016-01-20 DIAGNOSIS — I69398 Other sequelae of cerebral infarction: Secondary | ICD-10-CM | POA: Diagnosis not present

## 2016-01-20 DIAGNOSIS — R208 Other disturbances of skin sensation: Secondary | ICD-10-CM | POA: Insufficient documentation

## 2016-01-20 DIAGNOSIS — I69354 Hemiplegia and hemiparesis following cerebral infarction affecting left non-dominant side: Secondary | ICD-10-CM

## 2016-01-20 DIAGNOSIS — N3000 Acute cystitis without hematuria: Secondary | ICD-10-CM | POA: Diagnosis not present

## 2016-01-20 NOTE — Patient Instructions (Signed)
Please keep up with your walking every day  No cooking no using sharp knives

## 2016-01-20 NOTE — Progress Notes (Signed)
Subjective:    Patient ID: Leah Figueroa, female    DOB: 1923-06-14, 82 y.o.   MRN: [id number]  HPI   Left shoulder pain continues. Patient states it is worse when she lays on that side at night. We discussed using a pillow under the left shoulder to prevent the patient from rolling to that side. We also reviewed the x-rays from March 2017 demonstrating a narrowed subacromial space but no other acute abnormalities. Pain Inventory Average Pain 0 Pain Right Now 0 My pain is NA  In the last 24 hours, has pain interfered with the following? General activity 0 Relation with others 0 Enjoyment of life 0 What TIME of day is your pain at its worst? NA Sleep (in general) Fair  Pain is worse with: NA Pain improves with: NA Relief from Meds: 0  Mobility walk with assistance use a walker ability to climb steps?  yes do you drive?  no Do you have any goals in this area?  no  Function retired I need assistance with the following:  dressing, bathing, meal prep, household duties and shopping Do you have any goals in this area?  no  Neuro/Psych confusion depression anxiety  Prior Studies Any changes since last visit?  no  Physicians involved in your care Any changes since last visit?  no   Family History  Problem Relation Age of Onset  . Colon cancer Mother   . Coronary artery disease Brother   . Breast cancer Mother    Social History   Social History  . Marital Status: Widowed    Spouse Name: N/A  . Number of Children: 1  . Years of Education: 11   Occupational History  . retired    Social History Main Topics  . Smoking status: Never Smoker   . Smokeless tobacco: Never Used  . Alcohol Use: No  . Drug Use: No  . Sexual Activity: No   Other Topics Concern  . None   Social History Narrative   HSG. Married '48- 57 yrs/widowed. 1 daughter - '59; 1 grandchild. 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  Lives alone. I-ADLs, manages books, pays bills.. End-of-Life  Care: no heroic measures; No CPR, no mechanical ventilation.  Caffeine use/day:  None. Does Patient Exercise:  No       04/29/15 currently resident at [facility]      Past Surgical History  Procedure Laterality Date  . Right thyroid lobectomy    . Cataract extraction      left-2006, right 2011  . Intrathoracic goiter resection    . Cholecystectomy    . Lumpectomy for breast cancer Left 80's    radiation therapy  . Back surgery     Past Medical History  Diagnosis Date  . Other specified personal history presenting hazards to health(V15.89)   . Personal history of arthritis   . Unspecified personal history presenting hazards to health   . Cramp of limb   . Acute myocardial infarction of other anterior wall, episode of care unspecified   . Personal history of malignant neoplasm of breast   . Unspecified essential hypertension   . Personal history of other diseases of digestive system   . Unspecified hypothyroidism   . Other and unspecified hyperlipidemia   . Stroke [facility]) 03/2015    "left sided issues"  . Coronary artery disease   . Arthritis   . Cancer ([facility])     hx of breast lumpectomy    BP 117/66 mmHg  Pulse 60  Resp 12  SpO2 97%  Opioid Risk Score:   Fall Risk Score:  `1  Depression screen PHQ 2/9  Depression screen [facility] 2/9 01/20/2016 10/23/2015 04/29/2015  Decreased Interest 0 0 2  Down, Depressed, Hopeless 0 0 3  PHQ - 2 Score 0 0 5  Altered sleeping - - 2  Tired, decreased energy - - 0  Change in appetite - - 3  Feeling bad or failure about yourself  - - 2  Trouble concentrating - - 0  Moving slowly or fidgety/restless - - 0  Suicidal thoughts - - 0  PHQ-9 Score - - 12  Difficult doing work/chores - - Not difficult at all       Review of Systems  All other systems reviewed and are negative.      Objective:   Physical Exam  Constitutional: She is oriented to person, place, and time. She appears well-developed and well-nourished.  HENT:  Head:  Normocephalic and atraumatic.  Right Ear: External ear normal.  Left Ear: External ear normal.  Eyes: Conjunctivae and EOM are normal. Pupils are equal, round, and reactive to light.  Neck: Normal range of motion.  Musculoskeletal:       Right shoulder: She exhibits normal range of motion, no tenderness and no deformity.       Left shoulder: She exhibits decreased range of motion. She exhibits no tenderness and no deformity.  Neurological: She is alert and oriented to person, place, and time. She displays no atrophy and no tremor. A sensory deficit is present. She exhibits normal muscle tone. She displays no seizure activity. Coordination abnormal.  Psychiatric: She has a normal mood and affect.  Nursing note and vitals reviewed.  4+ in the left deltoid, biceps, triceps, grip, hip flexor, knee extensor, ankle dorsi flexor and plantar flexion 5/5 in the right deltoid, biceps, triceps, grip, hip flexor, knee extensor, ankle or flexor and plantar flexor       Assessment & Plan:  1. History of right MCA dissipation infarct with a mild residual left hemiparesis as well as a moderate residual left neglect. She should not resume driving she should not resume cooking or using sharp objects. She also has reduced sensation in the left upper extremity. We discussed that she may not get much more improvement in her function or her neurologic deficits at this point. Patient will follow with PCP Physical medicine and rehab patient follow-up in 6 months.

## 2016-02-06 ENCOUNTER — Telehealth: Payer: Self-pay | Admitting: Physical Medicine & Rehabilitation

## 2016-02-06 NOTE — Telephone Encounter (Signed)
Called and left a message for pt to call back and schedule injection.

## 2016-02-06 NOTE — Telephone Encounter (Signed)
Left subacromial bursa injection shoulder

## 2016-02-06 NOTE — Telephone Encounter (Signed)
Patient is requesting shoulder injections, was told at last visit she could have one done, but I didn't see anything in the note.  Please advise.

## 2016-02-06 NOTE — Telephone Encounter (Signed)
Please schedule. Thank you

## 2016-02-06 NOTE — Telephone Encounter (Signed)
Can we schedule a shoulder injection? Please advise.

## 2016-02-09 ENCOUNTER — Encounter: Payer: Self-pay | Admitting: Physical Medicine & Rehabilitation

## 2016-02-09 ENCOUNTER — Ambulatory Visit (HOSPITAL_BASED_OUTPATIENT_CLINIC_OR_DEPARTMENT_OTHER): Payer: Medicare Other | Admitting: Physical Medicine & Rehabilitation

## 2016-02-09 VITALS — BP 110/64 | HR 61 | Resp 14

## 2016-02-09 DIAGNOSIS — E785 Hyperlipidemia, unspecified: Secondary | ICD-10-CM | POA: Diagnosis not present

## 2016-02-09 DIAGNOSIS — I69398 Other sequelae of cerebral infarction: Secondary | ICD-10-CM | POA: Diagnosis not present

## 2016-02-09 DIAGNOSIS — M7542 Impingement syndrome of left shoulder: Secondary | ICD-10-CM | POA: Diagnosis not present

## 2016-02-09 DIAGNOSIS — G8190 Hemiplegia, unspecified affecting unspecified side: Secondary | ICD-10-CM | POA: Diagnosis not present

## 2016-02-09 DIAGNOSIS — I6931 Attention and concentration deficit following cerebral infarction: Secondary | ICD-10-CM | POA: Diagnosis not present

## 2016-02-09 DIAGNOSIS — R208 Other disturbances of skin sensation: Secondary | ICD-10-CM | POA: Diagnosis not present

## 2016-02-09 DIAGNOSIS — I10 Essential (primary) hypertension: Secondary | ICD-10-CM | POA: Diagnosis not present

## 2016-02-09 NOTE — Progress Notes (Signed)
Shoulder injection Left    Indication:Left Shoulder pain not relieved by medication management and other conservative care.  Informed consent was obtained after describing risks and benefits of the procedure with the patient, this includes bleeding, bruising, infection and medication side effects. The patient wishes to proceed and has given written consent. Patient was placed in a seated position. TheLeft shoulder was marked and prepped with betadine in the subacromial area. A 25-gauge 1-1/2 inch needle was inserted into the subacromial area. After negative draw back for blood, a solution containing 1 mL of 6 mg per ML betamethasone and 4 mL of 1% lidocaine was injected. A band aid was applied. The patient tolerated the procedure well. Post procedure instructions were given. 

## 2016-02-09 NOTE — Patient Instructions (Signed)

## 2016-03-02 ENCOUNTER — Encounter: Payer: Self-pay | Admitting: Neurology

## 2016-03-02 ENCOUNTER — Ambulatory Visit (INDEPENDENT_AMBULATORY_CARE_PROVIDER_SITE_OTHER): Payer: Medicare Other | Admitting: Neurology

## 2016-03-02 VITALS — BP 115/67 | HR 62 | Ht 59.0 in | Wt 103.8 lb

## 2016-03-02 DIAGNOSIS — I6990 Unspecified sequelae of unspecified cerebrovascular disease: Secondary | ICD-10-CM | POA: Diagnosis not present

## 2016-03-02 NOTE — Progress Notes (Signed)
[facility]  PATIENT: Leah Figueroa DOB: [birth year]  REFERRING CLINICIAN: S Biby / stroke follow up  HISTORY FROM: patient, daughter, PT from [facility] REASON FOR VISIT: follow up   HISTORICAL  CHIEF COMPLAINT:  Chief Complaint  Patient presents with  . Follow-up    Stroke follow up    HISTORY OF PRESENT ILLNESS:   82 year old female here for evaluation and follow-up from stroke in August 2016. Patient had onset of leaning to the left side, falling to the left, taken to the hospital via EMS code stroke was activated. Patient was evaluated and treated with IV TPA. She was admitted to the hospital for further evaluation. Patient was found to have right MCA ischemic infarction with right pontine ischemic infarction. Carotid Doppler showed no significant stenosis. Echocardiogram showed no cardioembolic source of emboli. LDL was 94 and hemoglobin [id number] was 5.8. Patient was on aspirin 81 mg daily and this was changed to Plavix. Patient went to inpatient rehabilitation, [facility]. Patient planning to be discharged within next week or 2.  Since discharge patient is doing well. No new symptoms, headaches, pain or other problems. She has made slow steady improvement over the last month.  Update 03/02/2016 :  She returns for follow-up after last visit 6 months ago. She is accompanied by her daughter provides most of the history. Patient seems to doing well. She has had no significant falls and injuries and she does use a walker though daughter feels she is not careful when she is first getting up or sitting down. Fortunately she had no major injuries. Her blood pressure is well controlled and today it is 115/67. She is tolerating Lipitor well without muscle aches or pain. She remains on Plavix and has not had any significant bleeding and only minor bruising. The daughter noticed that the patient is not moving her left side as well the  patient herself denies this. The patient is requiring more help with activities of daily living. Patient's daughter spends 2 nights with her and there is another caregiver for the remaining nights. She does get more confused and does have some sundowning. REVIEW OF SYSTEMS: Full 14 system review of systems performed and notable only for only  For decreased hearing, runny nose, leg swelling, anxiety, nervousness, frequent waking and all other systems negative   ALLERGIES: No Known Allergies  HOME MEDICATIONS: Outpatient Prescriptions Prior to Visit  Medication Sig Dispense Refill  . acetaminophen (TYLENOL) 325 MG tablet Take 2 tablets (650 mg total) by mouth every 4 (four) hours as needed for mild pain.    Marland Kitchen. ALPRAZolam (XANAX) 0.25 MG tablet Take 1 tablet (0.25 mg total) by mouth at bedtime as needed for anxiety. 7 tablet 0  . amLODipine (NORVASC) 5 MG tablet Take 5 mg by mouth daily.  6  . atorvastatin (LIPITOR) 10 MG tablet Take 10 mg by mouth daily.    . clopidogrel (PLAVIX) 75 MG tablet Take 1 tablet (75 mg total) by mouth daily.    Marland Kitchen. levothyroxine (SYNTHROID, LEVOTHROID) 25 MCG tablet Take 1 tablet by mouth  daily 90 tablet 0  . metoprolol (LOPRESSOR) 50 MG tablet Take 50 mg by mouth 2 (two) times daily.  3  . vitamin B-12 (CYANOCOBALAMIN) 1000 MCG tablet Take 1,000 mcg by mouth 2 (two) times daily.     . cholecalciferol (VITAMIN D) 400 UNITS TABS tablet Take 400 Units by mouth daily. Reported on 03/02/2016  No facility-administered medications prior to visit.    PAST MEDICAL HISTORY: Past Medical History  Diagnosis Date  . Other specified personal history presenting hazards to health(V15.89)   . Personal history of arthritis   . Unspecified personal history presenting hazards to health   . Cramp of limb   . Acute myocardial infarction of other anterior wall, episode of care unspecified   . Personal history of malignant neoplasm of breast   . Unspecified essential hypertension     . Personal history of other diseases of digestive system   . Unspecified hypothyroidism   . Other and unspecified hyperlipidemia   . Stroke [facility]) 03/2015    "left sided issues"  . Coronary artery disease   . Arthritis   . Cancer (HCC)     hx of breast lumpectomy     PAST SURGICAL HISTORY: Past Surgical History  Procedure Laterality Date  . Right thyroid lobectomy    . Cataract extraction      left-2006, right 2011  . Intrathoracic goiter resection    . Cholecystectomy    . Lumpectomy for breast cancer Left 80's    radiation therapy  . Back surgery      FAMILY HISTORY: Family History  Problem Relation Age of Onset  . Colon cancer Mother   . Coronary artery disease Brother   . Breast cancer Mother     SOCIAL HISTORY:  Social History   Social History  . Marital Status: Widowed    Spouse Name: N/A  . Number of Children: 1  . Years of Education: 11   Occupational History  . retired    Social History Main Topics  . Smoking status: Never Smoker   . Smokeless tobacco: Never Used  . Alcohol Use: No  . Drug Use: No  . Sexual Activity: No   Other Topics Concern  . Not on file   Social History Narrative   HSG. Married '48- 57 yrs/widowed. 1 daughter - '59; 1 grandchild. 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  Lives alone. I-ADLs, manages books, pays bills.. End-of-Life Care: no heroic measures; No CPR, no mechanical ventilation.  Caffeine use/day:  None. Does Patient Exercise:  No       04/29/15 currently resident at [facility] rehab        PHYSICAL EXAM  GENERAL EXAM/CONSTITUTIONAL: Vitals:  Filed Vitals:   03/02/16 1450  BP: 115/67  Pulse: 62  Height: 4\' 11"  (1.499 m)  Weight: 103 lb 12.8 oz (47.083 kg)   Body mass index is 20.95 kg/(m^2). No exam data present   Frail elderly Caucasian lady. Mild kyphosis.. Afebrile. Head is nontraumatic. Neck is supple without bruit.    Cardiac exam no murmur or gallop. Lungs are clear to auscultation. Distal pulses  are well felt.  MENTAL STATUS:  Awake alert oriented 2. Diminished attention, registration and recall. Follows simple one and few two-step commands. Diminished recall 0/3. Able to name only 5 animals. Speech is clear without aphasia or apraxia dysarthria. Extraocular movements are full range without nystagmus. Fundi were not visualized. Vision acuity and fields seem adequate. Face is symmetric without weakness. Hearing is significantly diminished bilaterally Tongue is midline. Motor system exam reveals no upper or lower extremity drift. . Mild weakness of left grip intrinsic hand muscles and left hip flexors. Diminished fine finger movements on the left. Orbits right over left approximately. Diminished left hemibody sensation with some sensory inattention to the left on double simultaneous stimulation. Deep tendon reflexes are 1+ depressed but  present. Plantars are downgoing. Coordination is slow but accurate. Gait slightly broad-based and unsteady. Uses a wheeled walker.   04/29/2015    0   2   2   0   0   22       DIAGNOSTIC DATA (LABS, IMAGING, TESTING) - I reviewed patient records, labs, notes, testing and imaging myself where available.  Lab Results  Component Value Date   WBC 6.6 09/04/2015   HGB 12.0 09/04/2015   HCT 37.2 09/04/2015   MCV 89.0 09/04/2015   PLT 195 09/04/2015      Component Value Date/Time   NA 142 09/04/2015 0349   K 4.1 09/04/2015 0349   CL 108 09/04/2015 0349   CO2 28 09/04/2015 0349   GLUCOSE 107* 09/04/2015 0349   BUN 8 09/04/2015 0349   CREATININE 0.59 09/04/2015 0349   CALCIUM 9.3 09/04/2015 0349   PROT 6.8 09/04/2015 0349   ALBUMIN 3.3* 09/04/2015 0349   AST 18 09/04/2015 0349   ALT 14 09/04/2015 0349   ALKPHOS 64 09/04/2015 0349   BILITOT 0.8 09/04/2015 0349   GFRNONAA >60 09/04/2015 0349   GFRAA >60 09/04/2015 0349   Lab Results  Component Value Date   CHOL 152 03/23/2015   HDL 47 03/23/2015   LDLCALC 94  03/23/2015   TRIG 54 03/23/2015   CHOLHDL 3.2 03/23/2015   Lab Results  Component Value Date   HGBA1C 5.8* 03/23/2015   No results found for: [id number] Lab Results  Component Value Date   TSH 0.757 09/04/2015    03/23/15 MRI brain  - Acute RIGHT frontoparietal lobe infarct, MCA territory. Subcentimeter acute RIGHT pontine infarct. - Severe white matter changes compatible with chronic small vessel ischemic disease. Scattered susceptibility artifacts suggest sequelae of chronic hypertension without lobar hematoma.   03/23/15 MRA head  - No acute large vessel occlusion.  - Dolicoectatic intracranial vessels compatible chronic hypertension with superimposed moderate luminal irregularity commonly seen with atherosclerosis. - Focal high-grade stenosis LEFT A2 segment.  03/24/15 carotid u/s  - No significant extracranial carotid artery stenosis demonstrated 1-39%.  - Vertebrals are patent with antegrade flow.  03/24/15 TTE  - Left ventricle: The cavity size was normal. There was moderateconcentric hypertrophy. Systolic function was normal. Theestimated ejection fraction was in the range of 60% to 65%. Thereis akinesis of the apical myocardium. Doppler parameters areconsistent with abnormal left ventricular relaxation (grade 1diastolic dysfunction). - Mitral valve: Moderately calcified annulus. - No cardiac source of emboli was identified.     ASSESSMENT AND PLAN  82 y.o. year old female here with right MCA and right pontine ischemic infarctions in early August 2016, status post IV TPA. Patient is doing well with mild gradual improvement in symptoms.  Due to advanced age, fall risk further cardioembolic workup was deferred as she is not a good long term anticoagulation candidate. Agree with medical management with Plavix, statin, blood pressure control.     PLAN:  I had a long d/w patient and daughter about her remote stroke, risk for recurrent stroke/TIAs, personally independently  reviewed imaging studies and stroke evaluation results and answered questions.Continue Plavix  for secondary stroke prevention and maintain strict control of hypertension with blood pressure goal below 130/90, diabetes with hemoglobin A1c goal below 6.5% and lipids with LDL cholesterol goal below 70 mg/dL. I also advised the patient to  Use a walker at all times  to avoid falls and injuries and to get up slowly. We also talked briefly about the patient declining more supervision at home and considering moving into assisted living .Marland Kitchen Greater than 50% time during this 25 minute visit was spent on counseling and coordination of care blood stroke risk and fall prevention and safety Followup in the future with stroke nurse practitioner in 6 months or call earlier if necessary.  Return in about 6 months (around 09/02/2016).    Delia Heady MD 03/02/2016, 5:30 PM Certified in Neurology, Vascular Neurology and Neuroimaging [facility] [address] [place], [place] [address] [phone]

## 2016-03-02 NOTE — Patient Instructions (Addendum)
I had a long d/w patient and daughter about her remote stroke, risk for recurrent stroke/TIAs, personally independently reviewed imaging studies and stroke evaluation results and answered questions.Continue Plavix  for secondary stroke prevention and maintain strict control of hypertension with blood pressure goal below 130/90, diabetes with hemoglobin A1c goal below 6.5% and lipids with LDL cholesterol goal below 70 mg/dL. I also advised the patient to  Use a walker at all times to avoid falls and injuries and to get up slowly. We also talked briefly about the patient declining more supervision at home and considering moving into assisted living .Followup in the future with stroke nurse practitioner in 6 months or call earlier if necessary.

## 2016-03-15 ENCOUNTER — Other Ambulatory Visit: Payer: Self-pay | Admitting: Internal Medicine

## 2016-05-11 ENCOUNTER — Ambulatory Visit: Payer: Medicare Other | Admitting: Physical Medicine & Rehabilitation

## 2016-05-13 ENCOUNTER — Ambulatory Visit: Payer: Medicare Other | Admitting: Physical Medicine & Rehabilitation

## 2016-05-13 ENCOUNTER — Encounter: Payer: Medicare Other | Attending: Physical Medicine & Rehabilitation

## 2016-05-21 DIAGNOSIS — D509 Iron deficiency anemia, unspecified: Secondary | ICD-10-CM | POA: Diagnosis not present

## 2016-05-21 DIAGNOSIS — F419 Anxiety disorder, unspecified: Secondary | ICD-10-CM | POA: Diagnosis not present

## 2016-05-21 DIAGNOSIS — E7800 Pure hypercholesterolemia, unspecified: Secondary | ICD-10-CM | POA: Diagnosis not present

## 2016-05-21 DIAGNOSIS — Z23 Encounter for immunization: Secondary | ICD-10-CM | POA: Diagnosis not present

## 2016-05-21 DIAGNOSIS — Z79899 Other long term (current) drug therapy: Secondary | ICD-10-CM | POA: Diagnosis not present

## 2016-05-21 DIAGNOSIS — E039 Hypothyroidism, unspecified: Secondary | ICD-10-CM | POA: Diagnosis not present

## 2016-05-21 DIAGNOSIS — I10 Essential (primary) hypertension: Secondary | ICD-10-CM | POA: Diagnosis not present

## 2016-05-26 ENCOUNTER — Other Ambulatory Visit: Payer: Self-pay | Admitting: Internal Medicine

## 2016-06-14 DIAGNOSIS — M542 Cervicalgia: Secondary | ICD-10-CM | POA: Diagnosis not present

## 2016-06-14 DIAGNOSIS — J020 Streptococcal pharyngitis: Secondary | ICD-10-CM | POA: Diagnosis not present

## 2016-06-14 DIAGNOSIS — J0100 Acute maxillary sinusitis, unspecified: Secondary | ICD-10-CM | POA: Diagnosis not present

## 2016-06-15 ENCOUNTER — Encounter (HOSPITAL_COMMUNITY): Payer: Self-pay | Admitting: *Deleted

## 2016-06-15 ENCOUNTER — Emergency Department (HOSPITAL_COMMUNITY)
Admission: EM | Admit: 2016-06-15 | Discharge: 2016-06-16 | Disposition: A | Discharge status: Home / Self Care | Payer: Medicare Other | Attending: Emergency Medicine | Admitting: Emergency Medicine

## 2016-06-15 DIAGNOSIS — E876 Hypokalemia: Secondary | ICD-10-CM | POA: Diagnosis not present

## 2016-06-15 DIAGNOSIS — Z8673 Personal history of transient ischemic attack (TIA), and cerebral infarction without residual deficits: Secondary | ICD-10-CM | POA: Insufficient documentation

## 2016-06-15 DIAGNOSIS — Z853 Personal history of malignant neoplasm of breast: Secondary | ICD-10-CM | POA: Insufficient documentation

## 2016-06-15 DIAGNOSIS — R5383 Other fatigue: Secondary | ICD-10-CM | POA: Diagnosis present

## 2016-06-15 DIAGNOSIS — I10 Essential (primary) hypertension: Secondary | ICD-10-CM | POA: Diagnosis not present

## 2016-06-15 DIAGNOSIS — I2510 Atherosclerotic heart disease of native coronary artery without angina pectoris: Secondary | ICD-10-CM | POA: Diagnosis not present

## 2016-06-15 DIAGNOSIS — R042 Hemoptysis: Secondary | ICD-10-CM | POA: Diagnosis not present

## 2016-06-15 DIAGNOSIS — I252 Old myocardial infarction: Secondary | ICD-10-CM | POA: Diagnosis not present

## 2016-06-15 DIAGNOSIS — Z7902 Long term (current) use of antithrombotics/antiplatelets: Secondary | ICD-10-CM | POA: Diagnosis not present

## 2016-06-15 DIAGNOSIS — E039 Hypothyroidism, unspecified: Secondary | ICD-10-CM | POA: Insufficient documentation

## 2016-06-15 LAB — CBC
HCT: 34.1 % — ABNORMAL LOW (ref 36.0–46.0)
Hemoglobin: 11.2 g/dL — ABNORMAL LOW (ref 12.0–15.0)
MCH: 29.8 pg (ref 26.0–34.0)
MCHC: 32.8 g/dL (ref 30.0–36.0)
MCV: 90.7 fL (ref 78.0–100.0)
PLATELETS: 258 10*3/uL (ref 150–400)
RBC: 3.76 MIL/uL — ABNORMAL LOW (ref 3.87–5.11)
RDW: 13.3 % (ref 11.5–15.5)
WBC: 6.7 10*3/uL (ref 4.0–10.5)

## 2016-06-15 LAB — COMPREHENSIVE METABOLIC PANEL
ALK PHOS: 53 U/L (ref 38–126)
ALT: 12 U/L — AB (ref 14–54)
AST: 20 U/L (ref 15–41)
Albumin: 3.3 g/dL — ABNORMAL LOW (ref 3.5–5.0)
Anion gap: 8 (ref 5–15)
BILIRUBIN TOTAL: 0.8 mg/dL (ref 0.3–1.2)
BUN: 13 mg/dL (ref 6–20)
CALCIUM: 9 mg/dL (ref 8.9–10.3)
CHLORIDE: 106 mmol/L (ref 101–111)
CO2: 28 mmol/L (ref 22–32)
CREATININE: 0.59 mg/dL (ref 0.44–1.00)
GFR calc Af Amer: >60 mL/min (ref >60)
Glucose, Bld: 104 mg/dL — ABNORMAL HIGH (ref 65–99)
Potassium: 2.6 mmol/L — CL (ref 3.5–5.1)
Sodium: 142 mmol/L (ref 135–145)
TOTAL PROTEIN: 6.6 g/dL (ref 6.5–8.1)

## 2016-06-15 LAB — URINALYSIS, ROUTINE W REFLEX MICROSCOPIC
GLUCOSE, UA: NEGATIVE mg/dL
HGB URINE DIPSTICK: NEGATIVE
KETONES UR: NEGATIVE mg/dL
Nitrite: NEGATIVE
PROTEIN: NEGATIVE mg/dL
Specific Gravity, Urine: 1.024 (ref 1.005–1.030)
pH: 6 (ref 5.0–8.0)

## 2016-06-15 LAB — URINE MICROSCOPIC-ADD ON: RBC / HPF: NONE SEEN RBC/hpf (ref 0–5)

## 2016-06-15 LAB — SAMPLE TO BLOOD BANK

## 2016-06-15 NOTE — ED Triage Notes (Signed)
Lab has called a pot of 2.6  Dr Radford Paxbeaton notified and he wants the pt to get an ekg and brought back to a room

## 2016-06-15 NOTE — ED Triage Notes (Signed)
The pt is here because she has been more lethargic today than yesterday according to her daughter.  She also coughed up some bright red blood today.  The pt has no pain anywhere her appetite has been less also  .  She saw her regular doctor yesterday and was diagnosed with a sinus infection   The daughter reports that her bp has been low today  However her bp is good here

## 2016-06-16 ENCOUNTER — Emergency Department (HOSPITAL_COMMUNITY): Payer: Medicare Other

## 2016-06-16 DIAGNOSIS — R042 Hemoptysis: Secondary | ICD-10-CM | POA: Diagnosis not present

## 2016-06-16 DIAGNOSIS — E876 Hypokalemia: Secondary | ICD-10-CM | POA: Diagnosis not present

## 2016-06-16 LAB — POTASSIUM: POTASSIUM: 3.2 mmol/L — AB (ref 3.5–5.1)

## 2016-06-16 MED ORDER — POTASSIUM CHLORIDE CRYS ER 20 MEQ PO TBCR
40.0000 meq | EXTENDED_RELEASE_TABLET | Freq: Once | ORAL | Status: AC
  Administered 2016-06-16: 40 meq via ORAL
  Filled 2016-06-16: qty 2

## 2016-06-16 MED ORDER — MAGNESIUM SULFATE IN D5W 1-5 GM/100ML-% IV SOLN
1.0000 g | Freq: Once | INTRAVENOUS | Status: AC
  Administered 2016-06-16: 1 g via INTRAVENOUS
  Filled 2016-06-16 (×2): qty 100

## 2016-06-16 MED ORDER — POTASSIUM CHLORIDE 10 MEQ/100ML IV SOLN
10.0000 meq | Freq: Once | INTRAVENOUS | Status: AC
  Administered 2016-06-16: 10 meq via INTRAVENOUS
  Filled 2016-06-16: qty 100

## 2016-06-16 MED ORDER — POTASSIUM CHLORIDE ER 10 MEQ PO TBCR: 10.0000 meq | EXTENDED_RELEASE_TABLET | Freq: Two times a day (BID) | ORAL | 0 refills | Status: DC

## 2016-06-16 MED ORDER — POTASSIUM CHLORIDE CRYS ER 20 MEQ PO TBCR
20.0000 meq | EXTENDED_RELEASE_TABLET | Freq: Once | ORAL | Status: AC
  Administered 2016-06-16: 20 meq via ORAL
  Filled 2016-06-16: qty 1

## 2016-06-16 NOTE — ED Provider Notes (Signed)
Patient signed out to me at shift change.  Patient originally seen by Melvenia BeamShari and Dr. Wilkie AyeHorton.  Plan:  Replace potassium and plan for discharge if feeling better.  10:32 AM Patient is still feeling very well. Will discharge to home. Potassium replaced in the emergency department. Will also discharge with some potassium supplementation for the next couple days. Primary care follow-up.   Roxy Horsemanobert Kendrick Remigio, PA-C 06/16/16 1032    Shon Batonourtney F Horton, MD 06/20/16 [phone]

## 2016-06-16 NOTE — ED Notes (Signed)
Pt daughter notified of patient discharge. Reports she is on the way to pick patient up and transport home.

## 2016-06-16 NOTE — ED Notes (Signed)
Pt daughter arrived to transport patient home. Pt taken to car via wheelchair. Verbalizes understanding of discharge instructions and prescriptions. NAD.

## 2016-06-16 NOTE — ED Provider Notes (Signed)
[facility]-EMERGENCY DEPT Provider Note   CSN: [id number] Arrival date & time: 06/15/16  2128     History   Chief Complaint Chief Complaint  Patient presents with  . Altered Mental Status    HPI Leah Figueroa is a 82 y.o. female.  Patient with a history of CAD, HTN, HLD, CVA who presents with daughter who is caregiver with concern that yesterday the patient had increased fatigue and decreased appetite. No confusion. She denies chest/abdominal pain, nausea/vomiting, SOB.  She has had a cough today with 2 episodes of hemoptysis. Reports little sputum, "just blood". Daughter reports she has nosebleeds because she picks at her nose but denies evidence of nosebleed last night. She is currently on Augmentin for otitis media diagnosed 2 days ago. No falls    The history is provided by the patient and a relative. No language interpreter was used.  Altered Mental Status   Pertinent negatives include no confusion.    Past Medical History:  Diagnosis Date  . Acute myocardial infarction of other anterior wall, episode of care unspecified   . Arthritis   . Cancer (HCC)    hx of breast lumpectomy   . Coronary artery disease   . Cramp of limb   . Other and unspecified hyperlipidemia   . Other specified personal history presenting hazards to health(V15.89)   . Personal history of arthritis   . Personal history of malignant neoplasm of breast   . Personal history of other diseases of digestive system   . Stroke [facility](HCC) 03/2015   "left sided issues"  . Unspecified essential hypertension   . Unspecified hypothyroidism   . Unspecified personal history presenting hazards to health     Patient Active Problem List   Diagnosis Date Noted  . Subacromial impingement of left shoulder 02/09/2016  . Hemiparesis affecting left side as late effect of stroke (HCC) 01/20/2016  . Vertigo 09/03/2015  . Dehydration 09/03/2015  . UTI (lower urinary tract infection) 09/03/2015  . Prolonged Q-T interval on ECG  09/03/2015  . Left-sided neglect 07/01/2015  . History of right MCA stroke 07/01/2015  . Altered sensation due to stroke (HCC) 04/29/2015  . Cognitive deficit, post-stroke 04/29/2015  . Pseudogout of left wrist 03/28/2015  . Hyperlipidemia LDL goal <70 03/26/2015  . Hyperglycemia 03/26/2015  . 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 Embolic stroke (HCC)   . Left hemiplegia (HCC)   . Homonymous hemianopia   . Chest wall trauma 01/11/2014  . Injury of left lower arm 01/11/2014  . Headache(784.0) 01/04/2013  . Allergic rhinitis, cause unspecified 01/04/2013  . Anemia 04/19/2012  . Epistaxis, recurrent 04/19/2012  . Hypothyroidism 01/01/2008  . Essential hypertension 01/01/2008  . HYPERTENSION 01/01/2008  . MYOCARDIAL INFARCTION, ACUTE, ANTERIOR WALL 01/01/2008  . LEG CRAMPS 01/01/2008  . ADENOCARCINOMA, BREAST, HX OF 01/01/2008  . DIVERTICULOSIS, COLON, HX OF 01/01/2008  . ARTHRITIS, HX OF 01/01/2008  . RADIATION THERAPY, HX OF 01/01/2008  . CATARACT EXTRACTION, HX OF 01/01/2008  . CHOLECYSTECTOMY, LAPAROSCOPIC, HX OF 01/01/2008    Past Surgical History:  Procedure Laterality Date  . BACK SURGERY    . CATARACT EXTRACTION     left-2006, right 2011  . CHOLECYSTECTOMY    . intrathoracic goiter resection    . lumpectomy for breast cancer Left 80's   radiation therapy  . right thyroid lobectomy      OB History    No data available       Home Medications  Prior to Admission medications   Medication Sig Start Date End Date Taking? Authorizing Provider  acetaminophen (TYLENOL) 325 MG tablet Take 2 tablets (650 mg total) by mouth every 4 (four) hours as needed for mild pain. 04/07/15   Jacquelynn Cree, PA-C  ALPRAZolam Prudy Feeler) 0.25 MG tablet Take 1 tablet (0.25 mg total) by mouth at bedtime as needed for anxiety. 09/05/15   Kathlen Mody, MD  amLODipine (NORVASC) 5 MG tablet Take 5 mg by mouth daily. 08/23/15   Historical Provider, MD    atorvastatin (LIPITOR) 10 MG tablet Take 10 mg by mouth daily.    Historical Provider, MD  Cholecalciferol (VITAMIN D3) 1000 units CAPS Take by mouth.    Historical Provider, MD  clopidogrel (PLAVIX) 75 MG tablet Take 1 tablet (75 mg total) by mouth daily. 04/07/15   Evlyn Kanner Love, PA-C  CRANBERRY-VITAMIN C PO Take by mouth.    Historical Provider, MD  levothyroxine (SYNTHROID, LEVOTHROID) 25 MCG tablet Take 1 tablet by mouth  daily 12/25/14   Tresa Garter, MD  metoprolol (LOPRESSOR) 50 MG tablet Take 50 mg by mouth 2 (two) times daily. 08/30/15   Historical Provider, MD  vitamin B-12 (CYANOCOBALAMIN) 1000 MCG tablet Take 1,000 mcg by mouth 2 (two) times daily.     Historical Provider, MD    Family History Family History  Problem Relation Age of Onset  . Colon cancer Mother   . Breast cancer Mother   . Coronary artery disease Brother     Social History Social History  Substance Use Topics  . Smoking status: Never Smoker  . Smokeless tobacco: Never Used  . Alcohol use No     Allergies   Review of patient's allergies indicates no known allergies.   Review of Systems Review of Systems  Constitutional: Positive for appetite change and fatigue. Negative for chills and fever.  HENT: Positive for nosebleeds (See HPI.).   Respiratory: Positive for cough.        See HPI.  Cardiovascular: Negative.   Gastrointestinal: Negative.   Genitourinary: Positive for decreased urine volume. Negative for dysuria.  Musculoskeletal: Negative.   Skin: Negative.  Negative for rash.  Neurological: Negative.  Negative for speech difficulty and headaches.  Psychiatric/Behavioral: Positive for dysphoric mood (Daughter states she has been having episodes of crying for 4 days, without history of depression). Negative for confusion.     Physical Exam Updated Vital Signs BP 142/67   Pulse (!) 58   Temp 98.4 F (36.9 C) (Oral)   Resp 18   Wt 46.3 kg   SpO2 96%   BMI 20.60 kg/m    Physical Exam  Constitutional: She is oriented to person, place, and time. She appears well-developed and well-nourished.  HENT:  Head: Normocephalic.  Left Ear: Tympanic membrane is erythematous.  Neck: Normal range of motion. Neck supple.  Cardiovascular: Normal rate and regular rhythm.   Murmur heard. Pulmonary/Chest: Effort normal and breath sounds normal. She has no wheezes. She has no rales.  Abdominal: Soft. Bowel sounds are normal. There is no tenderness. There is no rebound and no guarding.  Musculoskeletal: Normal range of motion.  Neurological: She is alert and oriented to person, place, and time.  No focal deficits. Speech clear and focused.   Skin: Skin is warm and dry. No rash noted.  Psychiatric: She has a normal mood and affect.     ED Treatments / Results  Labs (all labs ordered are listed, but only abnormal results are displayed)  Labs Reviewed  COMPREHENSIVE METABOLIC PANEL - Abnormal; Notable for the following:       Result Value   Potassium 2.6 (*)    Glucose, Bld 104 (*)    Albumin 3.3 (*)    ALT 12 (*)    All other components within normal limits  CBC - Abnormal; Notable for the following:    RBC 3.76 (*)    Hemoglobin 11.2 (*)    HCT 34.1 (*)    All other components within normal limits  URINALYSIS, ROUTINE W REFLEX MICROSCOPIC (NOT AT [facility]) - Abnormal; Notable for the following:    Color, Urine AMBER (*)    APPearance CLOUDY (*)    Bilirubin Urine SMALL (*)    Leukocytes, UA MODERATE (*)    All other components within normal limits  URINE MICROSCOPIC-ADD ON - Abnormal; Notable for the following:    Squamous Epithelial / LPF 0-5 (*)    Bacteria, UA RARE (*)    All other components within normal limits  URINE CULTURE  SAMPLE TO BLOOD BANK    EKG  EKG Interpretation None       Radiology Dg Chest 2 View  Result Date: 06/16/2016 CLINICAL DATA:  Hemoptysis EXAM: CHEST  2 VIEW COMPARISON:  04/07/2015 FINDINGS: Unchanged mild elevation of  the right hemidiaphragm. Unchanged moderate cardiomegaly with calcified mitral annulus. The lungs are clear. There is no pleural effusion. The pulmonary vasculature is normal. Hilar and mediastinal contours are unremarkable and unchanged. IMPRESSION: Unchanged cardiomegaly and mitral annular calcification. No acute cardiopulmonary findings. Electronically Signed   By: Ellery Plunkaniel R Mitchell M.D.   On: 06/16/2016 00:41    Procedures Procedures (including critical care time)  Medications Ordered in ED Medications - No data to display   Initial Impression / Assessment and Plan / ED Course  I have reviewed the triage vital signs and the nursing notes.  Pertinent labs & imaging results that were available during my care of the patient were reviewed by me and considered in my medical decision making (see chart for details).  Clinical Course    Patient presents with generalized fatigue and decreased activity. Per daughter, she had 2 episodes hemoptysis today as well. No fever.   No respiratory compromise on evaluation in ED. Consider patient's regular epistaxis from picking her nose as possible cause of bleeding. No cough or hemoptysis here.   She is found to have a low potassium of 2.6. Supplementation provided in ED. Sought observation admission overnight but it was decided by admitting team to keep her in the ED for oral and IV potassium and later admission if potassium did not improve.   Patient care signed out to oncoming care team.   Final Clinical Impressions(s) / ED Diagnoses   Final diagnoses:  None   1. Hypokalemia  New Prescriptions New Prescriptions   No medications on file     Elpidio AnisShari Yelitza Reach, Cordelia Poche-C 06/18/16 2120    Shon Batonourtney F Horton, MD 06/21/16 [phone]

## 2016-06-17 DIAGNOSIS — E876 Hypokalemia: Secondary | ICD-10-CM | POA: Diagnosis not present

## 2016-06-17 DIAGNOSIS — K921 Melena: Secondary | ICD-10-CM | POA: Diagnosis not present

## 2016-06-17 LAB — URINE CULTURE: Culture: NO GROWTH

## 2016-06-22 ENCOUNTER — Other Ambulatory Visit: Payer: Self-pay | Admitting: Internal Medicine

## 2016-06-25 ENCOUNTER — Emergency Department (HOSPITAL_COMMUNITY)
Admission: EM | Admit: 2016-06-25 | Discharge: 2016-06-26 | Disposition: A | Discharge status: Home / Self Care | Payer: Medicare Other | Attending: Emergency Medicine | Admitting: Emergency Medicine

## 2016-06-25 DIAGNOSIS — Z853 Personal history of malignant neoplasm of breast: Secondary | ICD-10-CM | POA: Insufficient documentation

## 2016-06-25 DIAGNOSIS — Z79899 Other long term (current) drug therapy: Secondary | ICD-10-CM | POA: Diagnosis not present

## 2016-06-25 DIAGNOSIS — D649 Anemia, unspecified: Secondary | ICD-10-CM

## 2016-06-25 DIAGNOSIS — Z791 Long term (current) use of non-steroidal anti-inflammatories (NSAID): Secondary | ICD-10-CM | POA: Insufficient documentation

## 2016-06-25 DIAGNOSIS — R58 Hemorrhage, not elsewhere classified: Secondary | ICD-10-CM | POA: Diagnosis not present

## 2016-06-25 DIAGNOSIS — I10 Essential (primary) hypertension: Secondary | ICD-10-CM | POA: Diagnosis not present

## 2016-06-25 DIAGNOSIS — I252 Old myocardial infarction: Secondary | ICD-10-CM | POA: Diagnosis not present

## 2016-06-25 DIAGNOSIS — I2510 Atherosclerotic heart disease of native coronary artery without angina pectoris: Secondary | ICD-10-CM | POA: Insufficient documentation

## 2016-06-25 DIAGNOSIS — E039 Hypothyroidism, unspecified: Secondary | ICD-10-CM | POA: Insufficient documentation

## 2016-06-25 DIAGNOSIS — K922 Gastrointestinal hemorrhage, unspecified: Secondary | ICD-10-CM | POA: Diagnosis not present

## 2016-06-25 DIAGNOSIS — K921 Melena: Secondary | ICD-10-CM | POA: Diagnosis not present

## 2016-06-25 DIAGNOSIS — D500 Iron deficiency anemia secondary to blood loss (chronic): Secondary | ICD-10-CM | POA: Diagnosis not present

## 2016-06-25 DIAGNOSIS — I517 Cardiomegaly: Secondary | ICD-10-CM | POA: Diagnosis not present

## 2016-06-25 NOTE — ED Provider Notes (Signed)
[facility]-EMERGENCY DEPT Provider Note   CSN: [id number] Arrival date & time: 06/25/16  2257  By signing my name below, I, Morene Crocker, attest that this documentation has been prepared under the direction and in the presence of Dione Booze, MD. Electronically Signed: Morene Crocker, Scribe. 06/25/16. 11:47 PM.  History   Chief Complaint Chief Complaint  Patient presents with  . Melena    The history is provided by the patient and a relative. No language interpreter was used.    HPI Comments: Leah Figueroa is a 82 y.o. female with a PMHx of CAD, cancer, HTN, and stroke who presents to the Emergency Department complaining of melena per daughter initially noted this evening. 2 episodes noted within the last 24 hours. Family also reports ongoing decreased appetite and fatigue. She states pt was coughing up blood last week and had a check up with PCP who advised follow up with pt if she continued to cough up blood or if blood was noted in her stools. Daughter denies any antiacid, peptobismol, aspirin, or ibuprofen use. Pt has been off of her anticoagulant medication for the 2 days now per PCP.  Past Medical History:  Diagnosis Date  . Acute myocardial infarction of other anterior wall, episode of care unspecified   . Arthritis   . Cancer (HCC)    hx of breast lumpectomy   . Coronary artery disease   . Cramp of limb   . Other and unspecified hyperlipidemia   . Other specified personal history presenting hazards to health(V15.89)   . Personal history of arthritis   . Personal history of malignant neoplasm of breast   . Personal history of other diseases of digestive system   . Stroke [facility]) 03/2015   "left sided issues"  . Unspecified essential hypertension   . Unspecified hypothyroidism   . Unspecified personal history presenting hazards to health     Patient Active Problem List   Diagnosis Date Noted  . Subacromial impingement of left shoulder 02/09/2016  . Hemiparesis affecting left side as  late effect of stroke (HCC) 01/20/2016  . Vertigo 09/03/2015  . Dehydration 09/03/2015  . UTI (lower urinary tract infection) 09/03/2015  . Prolonged Q-T interval on ECG 09/03/2015  . Left-sided neglect 07/01/2015  . History of right MCA stroke 07/01/2015  . Altered sensation due to stroke (HCC) 04/29/2015  . Cognitive deficit, post-stroke 04/29/2015  . Pseudogout of left wrist 03/28/2015  . Hyperlipidemia LDL goal <70 03/26/2015  . Hyperglycemia 03/26/2015  . 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 Embolic stroke (HCC)   . Left hemiplegia (HCC)   . Homonymous hemianopia   . Chest wall trauma 01/11/2014  . Injury of left lower arm 01/11/2014  . Headache(784.0) 01/04/2013  . Allergic rhinitis, cause unspecified 01/04/2013  . Anemia 04/19/2012  . Epistaxis, recurrent 04/19/2012  . Hypothyroidism 01/01/2008  . Essential hypertension 01/01/2008  . HYPERTENSION 01/01/2008  . MYOCARDIAL INFARCTION, ACUTE, ANTERIOR WALL 01/01/2008  . LEG CRAMPS 01/01/2008  . ADENOCARCINOMA, BREAST, HX OF 01/01/2008  . DIVERTICULOSIS, COLON, HX OF 01/01/2008  . ARTHRITIS, HX OF 01/01/2008  . RADIATION THERAPY, HX OF 01/01/2008  . CATARACT EXTRACTION, HX OF 01/01/2008  . CHOLECYSTECTOMY, LAPAROSCOPIC, HX OF 01/01/2008    Past Surgical History:  Procedure Laterality Date  . BACK SURGERY    . CATARACT EXTRACTION     left-2006, right 2011  . CHOLECYSTECTOMY    . intrathoracic goiter resection    . lumpectomy for breast  cancer Left 80's   radiation therapy  . right thyroid lobectomy      OB History    No data available       Home Medications    Prior to Admission medications   Medication Sig Start Date End Date Taking? Authorizing Provider  acetaminophen (TYLENOL) 325 MG tablet Take 2 tablets (650 mg total) by mouth every 4 (four) hours as needed for mild pain. 04/07/15   Jacquelynn CreePamela S Love, PA-C  ALPRAZolam Prudy Feeler(XANAX) 0.25 MG tablet Take 1 tablet (0.25 mg  total) by mouth at bedtime as needed for anxiety. Patient taking differently: Take 0.25 mg by mouth at bedtime.  09/05/15   Kathlen ModyVijaya Akula, MD  amLODipine (NORVASC) 5 MG tablet Take 5 mg by mouth daily. 08/23/15   Historical Provider, MD  amoxicillin-clavulanate (AUGMENTIN) 600-42.9 MG/5ML suspension Take 68 mg by mouth 2 (two) times daily. 8 ml    Historical Provider, MD  atorvastatin (LIPITOR) 10 MG tablet Take 10 mg by mouth daily.    Historical Provider, MD  Cholecalciferol (VITAMIN D3) 1000 units CAPS Take 5,000 Units by mouth daily.     Historical Provider, MD  clopidogrel (PLAVIX) 75 MG tablet Take 1 tablet (75 mg total) by mouth daily. 04/07/15   Evlyn KannerPamela S Love, PA-C  CRANBERRY-VITAMIN C PO Take 4,200 mg by mouth daily.     Historical Provider, MD  levothyroxine (SYNTHROID, LEVOTHROID) 25 MCG tablet Take 1 tablet by mouth  daily 12/25/14   Tresa GarterAleksei V Plotnikov, MD  metoprolol (LOPRESSOR) 50 MG tablet Take 50 mg by mouth 2 (two) times daily. 08/30/15   Historical Provider, MD  potassium chloride (K-DUR) 10 MEQ tablet Take 1 tablet (10 mEq total) by mouth 2 (two) times daily. 06/16/16   Roxy Horsemanobert Browning, PA-C  vitamin B-12 (CYANOCOBALAMIN) 1000 MCG tablet Take 1,000 mcg by mouth daily.     Historical Provider, MD    Family History Family History  Problem Relation Age of Onset  . Colon cancer Mother   . Breast cancer Mother   . Coronary artery disease Brother     Social History Social History  Substance Use Topics  . Smoking status: Never Smoker  . Smokeless tobacco: Never Used  . Alcohol use No     Allergies   Patient has no known allergies.   Review of Systems Review of Systems  Constitutional: Positive for appetite change and fatigue. Negative for chills and fever.  Respiratory: Negative for shortness of breath.   Cardiovascular: Negative for chest pain.  Gastrointestinal: Positive for anal bleeding.  All other systems reviewed and are negative.    Physical Exam Updated  Vital Signs BP 159/97 (BP Location: Left Arm)   Pulse 72   Temp 98.4 F (36.9 C) (Oral)   Resp 25   SpO2 98%   Physical Exam  Constitutional: She is oriented to person, place, and time. She appears well-developed and well-nourished.  HENT:  Head: Normocephalic and atraumatic.  Eyes: EOM are normal. Pupils are equal, round, and reactive to light.  No conjunctival pallor   Neck: Normal range of motion. Neck supple. No JVD present.  Cardiovascular: Normal rate, regular rhythm and normal heart sounds.   No murmur heard. Pulmonary/Chest: Effort normal and breath sounds normal. She has no wheezes. She has no rales. She exhibits no tenderness.  Abdominal: Soft. Bowel sounds are normal. She exhibits no distension and no mass. There is no tenderness.  Genitourinary:  Genitourinary Comments: Normal sphincter tone with greenish/black stool present  Musculoskeletal: Normal range of motion. She exhibits no edema.  Lymphadenopathy:    She has no cervical adenopathy.  Neurological: She is alert and oriented to person, place, and time. No cranial nerve deficit. She exhibits normal muscle tone. Coordination normal.  Skin: Skin is warm and dry. No rash noted.  Psychiatric: She has a normal mood and affect. Her behavior is normal. Judgment and thought content normal.  Nursing note and vitals reviewed.    ED Treatments / Results  DIAGNOSTIC STUDIES: Oxygen Saturation is 98% on RA, normal by my interpretation.    COORDINATION OF CARE: 11:42 PM Discussed treatment plan with pt at bedside and pt agreed to plan.   Labs (all labs ordered are listed, but only abnormal results are displayed) Labs Reviewed  CBC WITH DIFFERENTIAL/PLATELET - Abnormal; Notable for the following:       Result Value   RBC 3.74 (*)    Hemoglobin 11.4 (*)    HCT 34.6 (*)    All other components within normal limits  URINALYSIS, ROUTINE W REFLEX MICROSCOPIC (NOT AT [facility]) - Abnormal; Notable for the following:     APPearance CLOUDY (*)    Hgb urine dipstick TRACE (*)    Leukocytes, UA TRACE (*)    All other components within normal limits  URINE MICROSCOPIC-ADD ON - Abnormal; Notable for the following:    Squamous Epithelial / LPF 0-5 (*)    Bacteria, UA FEW (*)    All other components within normal limits  COMPREHENSIVE METABOLIC PANEL  POC OCCULT BLOOD, ED  TYPE AND SCREEN    Radiology Dg Chest 2 View  Result Date: 06/26/2016 CLINICAL DATA:  82 y/o  F; 3 days of black tarry stools. EXAM: CHEST  2 VIEW COMPARISON:  06/16/2016 chest radiograph FINDINGS: Stable mild cardiomegaly given projection and technique. Aortic atherosclerosis with arch calcifications. Surgical staples in the right lower cervical region compatible with prior hemithyroidectomy. Left axillary surgical staples. Right upper quadrant surgical staples from cholecystectomy. Low lung volumes accentuate pulmonary markings. Hazy bibasilar opacities probably represent atelectasis. IMPRESSION: Stable cardiomegaly. Aortic atherosclerosis. Low lung volumes. Bibasilar hazy opacities probably represent atelectasis. Electronically Signed   By: Mitzi HansenLance  Furusawa-Stratton M.D.   On: 06/26/2016 00:25    Procedures Procedures (including critical care time)  Medications Ordered in ED Medications - No data to display   Initial Impression / Assessment and Plan / ED Course  I have reviewed the triage vital signs and the nursing notes.  Pertinent labs & imaging results that were available during my care of the patient were reviewed by me and considered in my medical decision making (see chart for details).  Clinical Course    Melena at home. Stool on my exam is more greenish black than true melena. Stool tests Hemoccult negative. Laboratory workup shows stable hemoglobin and no increase in BUN or creatinine over baseline. Orthostatic vital signs showed no significant change in pulse or blood pressure. No evidence of significant bleeding today. Old  records are reviewed, and there are no relevant past visits. Patient is reassured of benign workup and is referred back to PCP.  Final Clinical Impressions(s) / ED Diagnoses   Final diagnoses:  Black stool  Normochromic normocytic anemia    New Prescriptions New Prescriptions   No medications on file   I personally performed the services described in this documentation, which was scribed in my presence. The recorded information has been reviewed and is accurate.       Dione Boozeavid Siona Coulston, MD  06/26/16 0217  

## 2016-06-25 NOTE — ED Notes (Signed)
Bed: [id number] Expected date:  Expected time:  Means of arrival:  Comments: 82 yo /rectal bleed

## 2016-06-25 NOTE — ED Triage Notes (Addendum)
Per EMS, pt is from home with complaints of black tarry stools X3 today. Pt reports being constipated for 3 days prior. Pt taking anticoagulations at this time due to stroke over a year ago. Pt denies any pain or tenderness to abdomen.

## 2016-06-26 ENCOUNTER — Emergency Department (HOSPITAL_COMMUNITY): Payer: Medicare Other

## 2016-06-26 DIAGNOSIS — D500 Iron deficiency anemia secondary to blood loss (chronic): Secondary | ICD-10-CM | POA: Diagnosis not present

## 2016-06-26 DIAGNOSIS — I517 Cardiomegaly: Secondary | ICD-10-CM | POA: Diagnosis not present

## 2016-06-26 LAB — URINE MICROSCOPIC-ADD ON

## 2016-06-26 LAB — CBC WITH DIFFERENTIAL/PLATELET
Basophils Absolute: 0 10*3/uL (ref 0.0–0.1)
Basophils Relative: 0 %
EOS ABS: 0.1 10*3/uL (ref 0.0–0.7)
EOS PCT: 1 %
HCT: 34.6 % — ABNORMAL LOW (ref 36.0–46.0)
Hemoglobin: 11.4 g/dL — ABNORMAL LOW (ref 12.0–15.0)
LYMPHS ABS: 1.4 10*3/uL (ref 0.7–4.0)
LYMPHS PCT: 26 %
MCH: 30.5 pg (ref 26.0–34.0)
MCHC: 32.9 g/dL (ref 30.0–36.0)
MCV: 92.5 fL (ref 78.0–100.0)
MONO ABS: 0.8 10*3/uL (ref 0.1–1.0)
MONOS PCT: 14 %
Neutro Abs: 3.2 10*3/uL (ref 1.7–7.7)
Neutrophils Relative %: 59 %
PLATELETS: 237 10*3/uL (ref 150–400)
RBC: 3.74 MIL/uL — AB (ref 3.87–5.11)
RDW: 13.9 % (ref 11.5–15.5)
WBC: 5.4 10*3/uL (ref 4.0–10.5)

## 2016-06-26 LAB — URINALYSIS, ROUTINE W REFLEX MICROSCOPIC
Bilirubin Urine: NEGATIVE
GLUCOSE, UA: NEGATIVE mg/dL
Ketones, ur: NEGATIVE mg/dL
Nitrite: NEGATIVE
PROTEIN: NEGATIVE mg/dL
SPECIFIC GRAVITY, URINE: 1.008 (ref 1.005–1.030)
pH: 7 (ref 5.0–8.0)

## 2016-06-26 LAB — POC OCCULT BLOOD, ED: FECAL OCCULT BLD: NEGATIVE

## 2016-06-26 LAB — COMPREHENSIVE METABOLIC PANEL
ALK PHOS: 54 U/L (ref 38–126)
ALT: 15 U/L (ref 14–54)
AST: 24 U/L (ref 15–41)
Albumin: 3.7 g/dL (ref 3.5–5.0)
Anion gap: 6 (ref 5–15)
BUN: 12 mg/dL (ref 6–20)
CALCIUM: 9.3 mg/dL (ref 8.9–10.3)
CHLORIDE: 107 mmol/L (ref 101–111)
CO2: 28 mmol/L (ref 22–32)
CREATININE: 0.59 mg/dL (ref 0.44–1.00)
GFR calc Af Amer: >60 mL/min (ref >60)
Glucose, Bld: 95 mg/dL (ref 65–99)
Potassium: 3.9 mmol/L (ref 3.5–5.1)
SODIUM: 141 mmol/L (ref 135–145)
Total Bilirubin: 0.7 mg/dL (ref 0.3–1.2)
Total Protein: 7 g/dL (ref 6.5–8.1)

## 2016-06-26 LAB — TYPE AND SCREEN
ABO/RH(D): A POS
Antibody Screen: NEGATIVE

## 2016-06-26 NOTE — Discharge Instructions (Signed)
Your evaluation today showed no sign of any bleeding. Return if you are having any further problems.

## 2016-07-20 ENCOUNTER — Encounter: Payer: Self-pay | Admitting: Physical Medicine & Rehabilitation

## 2016-07-20 ENCOUNTER — Encounter: Payer: Medicare Other | Attending: Physical Medicine & Rehabilitation

## 2016-07-20 ENCOUNTER — Ambulatory Visit (HOSPITAL_BASED_OUTPATIENT_CLINIC_OR_DEPARTMENT_OTHER): Payer: Medicare Other | Admitting: Physical Medicine & Rehabilitation

## 2016-07-20 VITALS — BP 134/72 | HR 64 | Wt 99.2 lb

## 2016-07-20 DIAGNOSIS — M7542 Impingement syndrome of left shoulder: Secondary | ICD-10-CM | POA: Diagnosis not present

## 2016-07-20 DIAGNOSIS — R279 Unspecified lack of coordination: Secondary | ICD-10-CM | POA: Diagnosis not present

## 2016-07-20 DIAGNOSIS — I639 Cerebral infarction, unspecified: Secondary | ICD-10-CM | POA: Diagnosis not present

## 2016-07-20 DIAGNOSIS — M25512 Pain in left shoulder: Secondary | ICD-10-CM | POA: Insufficient documentation

## 2016-07-20 DIAGNOSIS — I69354 Hemiplegia and hemiparesis following cerebral infarction affecting left non-dominant side: Secondary | ICD-10-CM

## 2016-07-20 NOTE — Patient Instructions (Signed)
Will schedule for another injection in 3 months.

## 2016-07-20 NOTE — Progress Notes (Signed)
Subjective:    Patient ID: Leah FillerMabel E Figueroa, female    DOB: [birth year], 82 y.o.   MRN: [id number]  HPI Recurrent left shoulder pain. Patient has had no new injuries. Last visit was 02/09/2016. Has had 2 ED visit. Since that time as well as a neurology visit. Patient had 2 months relief after left shoulder injection performed on 02/09/2016. Pain is rated as severe Pain Inventory Average Pain 8 Pain Right Now 0 My pain is intermittent and burning  In the last 24 hours, has pain interfered with the following? General activity 5 Relation with others 5 Enjoyment of life 5 What TIME of day is your pain at its worst? night Sleep (in general) Fair  Pain is worse with: some activites Pain improves with: pacing activities and salon pas patches and tylenol Relief from Meds: 4  Mobility use a walker  Function I need assistance with the following:  dressing, bathing, toileting, meal prep, household duties and shopping  Neuro/Psych No problems in this area  Prior Studies Any changes since last visit?  no  Physicians involved in your care Any changes since last visit?  no   Family History  Problem Relation Age of Onset  . Colon cancer Mother   . Breast cancer Mother   . Coronary artery disease Brother    Social History   Social History  . Marital status: Widowed    Spouse name: N/A  . Number of children: 1  . Years of education: 2611   Occupational History  . retired    Social History Main Topics  . Smoking status: Never Smoker  . Smokeless tobacco: Never Used  . Alcohol use No  . Drug use: No  . Sexual activity: No   Other Topics Concern  . None   Social History Narrative   HSG. Married '48- 57 yrs/widowed. 1 daughter - '59; 1 grandchild. 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  Lives alone. I-ADLs, manages books, pays bills.. End-of-Life Care: no heroic measures; No CPR, no mechanical ventilation.  Caffeine use/day:  None. Does Patient Exercise:  No       04/29/15  [facility]      Past Surgical History:  Procedure Laterality Date  . BACK SURGERY    . CATARACT EXTRACTION     left-2006, right 2011  . CHOLECYSTECTOMY    . intrathoracic goiter resection    . lumpectomy for breast cancer Left 80's   radiation therapy  . right thyroid lobectomy     Past Medical History:  Diagnosis Date  . Acute myocardial infarction of other anterior wall, episode of care unspecified   . Arthritis   . Cancer (HCC)    hx of breast lumpectomy   . Coronary artery disease   . Cramp of limb   . Other and unspecified hyperlipidemia   . Other specified personal history presenting hazards to health(V15.89)   . Personal history of arthritis   . Personal history of malignant neoplasm of breast   . Personal history of other diseases of digestive system   . Stroke [facility](HCC) 03/2015   "left sided issues"  . Unspecified essential hypertension   . Unspecified hypothyroidism   . Unspecified personal history presenting hazards to health    BP 134/72   Pulse 64   Wt 99 lb 3.2 oz (45 kg)   SpO2 95%   BMI 20.04 kg/m   Opioid Risk Score:   Fall Risk Score:  `1  Depression screen PHQ  2/9  Depression screen [facility] 2/9 07/20/2016 01/20/2016 10/23/2015 04/29/2015  Decreased Interest 0 0 0 2  Down, Depressed, Hopeless 0 0 0 3  PHQ - 2 Score 0 0 0 5  Altered sleeping - - - 2  Tired, decreased energy - - - 0  Change in appetite - - - 3  Feeling bad or failure about yourself  - - - 2  Trouble concentrating - - - 0  Moving slowly or fidgety/restless - - - 0  Suicidal thoughts - - - 0  PHQ-9 Score - - - 12  Difficult doing work/chores - - - Not difficult at all    Review of Systems  Constitutional: Positive for appetite change and unexpected weight change.  HENT: Negative.   Eyes: Negative.   Respiratory: Negative.   Cardiovascular: Negative.   Gastrointestinal: Positive for blood in stool.  Endocrine: Negative.   Genitourinary: Negative.     Musculoskeletal: Positive for arthralgias.       Shoulder pain left---shots helped in past  Skin: Negative.   Allergic/Immunologic: Negative.   Neurological: Negative.   Hematological: Negative.   Psychiatric/Behavioral: Negative.   All other systems reviewed and are negative.      Objective:   Physical Exam  Constitutional: She is oriented to person, place, and time. She appears well-developed and well-nourished.  HENT:  Head: Normocephalic and atraumatic.  Eyes: Conjunctivae are normal. Pupils are equal, round, and reactive to light.  Neck: Normal range of motion.  Neurological: She is alert and oriented to person, place, and time. Gait normal.  Skin: Skin is warm and dry.  Psychiatric: She has a normal mood and affect.  Nursing note and vitals reviewed.   Negative drop arm test on the left side. Mild tenderness in the subacromial area. Motor strength is 4 minus, left deltoid, triceps, grip       Assessment & Plan:  1. Left posterior shoulder pain. This appears to be mainly impingement syndrome. Had good results with injection for 2 months, recommend repeat. At this point do not think she needs any type of neuro modulation.   Shoulder injection Left  Subacromial  Indication: Left Shoulder pain not relieved by medication management and other conservative care.  Informed consent was obtained after describing risks and benefits of the procedure with the patient, this includes bleeding, bruising, infection and medication side effects. The patient wishes to proceed and has given written consent. Patient was placed in a seated position. TheLeft shoulder was marked and prepped with betadine in the subacromial area. A 25-gauge 1-1/2 inch needle was inserted into the subacromial area. After negative draw back for blood, a solution containing 1 mL of 6 mg per ML betamethasone and 4 mL of 1% lidocaine was injected. A band aid was applied. The patient tolerated the procedure well.  Post procedure instructions were given.

## 2016-08-31 DIAGNOSIS — F419 Anxiety disorder, unspecified: Secondary | ICD-10-CM | POA: Diagnosis not present

## 2016-08-31 DIAGNOSIS — R300 Dysuria: Secondary | ICD-10-CM | POA: Diagnosis not present

## 2016-09-03 ENCOUNTER — Telehealth: Payer: Self-pay | Admitting: Neurology

## 2016-09-06 ENCOUNTER — Ambulatory Visit (INDEPENDENT_AMBULATORY_CARE_PROVIDER_SITE_OTHER): Payer: Medicare Other | Admitting: Nurse Practitioner

## 2016-09-06 ENCOUNTER — Encounter: Payer: Self-pay | Admitting: Nurse Practitioner

## 2016-09-06 VITALS — BP 130/72 | HR 66 | Ht 59.0 in | Wt 96.6 lb

## 2016-09-06 DIAGNOSIS — I69319 Unspecified symptoms and signs involving cognitive functions following cerebral infarction: Secondary | ICD-10-CM | POA: Diagnosis not present

## 2016-09-06 DIAGNOSIS — I63311 Cerebral infarction due to thrombosis of right middle cerebral artery: Secondary | ICD-10-CM

## 2016-09-06 DIAGNOSIS — E785 Hyperlipidemia, unspecified: Secondary | ICD-10-CM

## 2016-09-06 DIAGNOSIS — I10 Essential (primary) hypertension: Secondary | ICD-10-CM | POA: Diagnosis not present

## 2016-09-06 NOTE — Progress Notes (Signed)
[facility]  PATIENT: Leah Figueroa DOB: February 02, 1923   REASON FOR VISIT: follow-up for history of stroke, Mild cognitive impairment, recent worsening memory HISTORY FROM:patient and 83 daughters    HISTORY OF PRESENT ILLNESS: HISTORY PS83 year old female here for evaluation and follow-up from stroke in August 2016. Patient had onset of leaning to the left side, falling to the left, taken to the hospital via EMS code stroke was activated. Patient was evaluated and treated with IV TPA. She was admitted to the hospital for further evaluation. Patient was found to have right MCA ischemic infarction with right pontine ischemic infarction. Carotid Doppler showed no significant stenosis. Echocardiogram showed no cardioembolic source of emboli. LDL was 94 and hemoglobin A1c was 5.8. Patient was on aspirin 81 mg daily and this was changed to Plavix. Patient went to inpatient rehabilitation, now at [facility] for skilled nursing rehabilitation. Patient planning to be discharged within next week or 2.  Since discharge patient is doing well. No new symptoms, headaches, pain or other problems. She has made slow steady improvement over the last month.  Update 03/02/2016 : PS She returns for follow-up after last visit 6 months ago. She is accompanied by her daughter provides most of the history. Patient seems to doing well. She has had no significant falls and injuries and she does use a walker though daughter feels she is not careful when she is first getting up or sitting down. Fortunately she had no major injuries. Her blood pressure is well controlled and today it is 115/67. She is tolerating Lipitor well without muscle aches or pain. She remains on Plavix and has not had any significant bleeding and only minor bruising. The daughter noticed that the patient is not moving her left side as well the patient herself denies this. The patient is requiring more help with activities of daily  living. Patient's daughter spends 2 nights with her and there is another caregiver for the remaining nights. She does get more confused and does have some sundowning. UPDATE 01/22/2018CM [name], 83-year-old female returns for follow-up with HER-83 daughters. They report that she has had worsening of her memory lately however she is currently being treated for urinary tract infection on antibiotics. Memory score is actually 26 out of 30. She ambulates with rolling walker no recent falls. She has a history of macular degeneration. Blood pressure is well controlled in the office today at 130/72.She remains on Lipitor and according to the daughter her labs have been stable with Dr. Felipa Eth.she remains on Plavix for secondary stroke prevention with minimal bruising.patient is currently living at home with the daughters assisting with care 81 7 She returns for reevaluation  REVIEW OF SYSTEMS: Full 14 system review of systems performed and notable only for those listed, all others are neg:  Constitutional: neg  Cardiovascular: neg Ear/Nose/Throat: hearing loss  Skin: neg Eyes: macular degeneration Respiratory: neg Gastroitestinal: neg  Hematology/Lymphatic:easy bruising Endocrine: neg Musculoskeletal:neg Allergy/Immunology: neg Neurological: Memory loss Psychiatric: depression and anxiety Sleep : neg   ALLERGIES: No Known Allergies  HOME MEDICATIONS: Outpatient Medications Prior to Visit  Medication Sig Dispense Refill  . acetaminophen (TYLENOL) 325 MG tablet Take 2 tablets (650 mg total) by mouth every 4 (four) hours as needed for mild pain.    Marland Kitchen amLODipine (NORVASC) 5 MG tablet Take 5 mg by mouth daily.  6  . atorvastatin (LIPITOR) 10 MG tablet Take 10 mg by mouth daily.    . Cholecalciferol (VITAMIN D3) 1000 units  CAPS Take 5,000 Units by mouth daily.     . clopidogrel (PLAVIX) 75 MG tablet Take 1 tablet (75 mg total) by mouth daily.    Marland Kitchen CRANBERRY-VITAMIN C PO Take 4,200 mg by mouth  daily.     . ferrous sulfate 325 (65 FE) MG tablet Take 325 mg by mouth daily.  5  . levothyroxine (SYNTHROID, LEVOTHROID) 25 MCG tablet Take 1 tablet by mouth  daily 90 tablet 0  . Melatonin 10 MG TABS Take 1 tablet by mouth at bedtime.    . metoprolol (LOPRESSOR) 50 MG tablet Take 50 mg by mouth 2 (two) times daily.  3  . vitamin B-12 (CYANOCOBALAMIN) 1000 MCG tablet Take 1,000 mcg by mouth daily.      No facility-administered medications prior to visit.     PAST MEDICAL HISTORY: Past Medical History:  Diagnosis Date  . Acute myocardial infarction of other anterior wall, episode of care unspecified   . Arthritis   . Cancer (HCC)    hx of breast lumpectomy   . Coronary artery disease   . Cramp of limb   . Other and unspecified hyperlipidemia   . Other specified personal history presenting hazards to health(V15.89)   . Personal history of arthritis   . Personal history of malignant neoplasm of breast   . Personal history of other diseases of digestive system   . Stroke [facility]) 03/2015   "left sided issues"  . Unspecified essential hypertension   . Unspecified hypothyroidism   . Unspecified personal history presenting hazards to health     PAST SURGICAL HISTORY: Past Surgical History:  Procedure Laterality Date  . BACK SURGERY    . CATARACT EXTRACTION     left-2006, right 2011  . CHOLECYSTECTOMY    . intrathoracic goiter resection    . lumpectomy for breast cancer Left 80's   radiation therapy  . right thyroid lobectomy      FAMILY HISTORY: Family History  Problem Relation Age of Onset  . Colon cancer Mother   . Breast cancer Mother   . Coronary artery disease Brother     SOCIAL HISTORY: Social History   Social History  . Marital status: Widowed    Spouse name: N/A  . Number of children: 1  . Years of education: 5   Occupational History  . retired    Social History Main Topics  . Smoking status: Never Smoker  . Smokeless tobacco: Never Used  . Alcohol  use No  . Drug use: No  . Sexual activity: No   Other Topics Concern  . Not on file   Social History Narrative   HSG. Married '48- 57 yrs/widowed. 1 daughter - '59; 1 grandchild. 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  Lives alone. I-ADLs, manages books, pays bills.. End-of-Life Care: no heroic measures; No CPR, no mechanical ventilation.  Caffeine use/day:  None. Does Patient Exercise:  No       04/29/15 currently resident at [facility]:   09/06/16 1340  BP: 130/72  Pulse: 66  Weight: 96 lb 9.6 oz (43.8 kg)  Height: _0  (1.499 m)   Body mass index is 19.51 kg/m.  Generalized: Well developed, frail female in no acute distress  Head: normocephalic and atraumatic,. Oropharynx benign  Neck: Supple, no carotid bruits  Cardiac: Regular rate rhythm, no murmur  Musculoskeletal: mild kyphosis  Neurological examination   Mentation: Alert oriented to time, not the  clinicplace, history taking. MMSE - Mini Mental State Exam 09/06/2016 04/29/2015  Orientation to time 5 5  Orientation to Place 4 5  Registration 3 3  Attention/ Calculation 4 0  Recall 2 2  Language- name 2 objects 2 2  Language- repeat 1 0  Language- follow 3 step command 3 3  Language- read & follow direction 1 1  Write a sentence 1 1  Copy design 0 0  Total score 26 22     Follows all commands speech and language fluent.   Cranial nerve II-XII: Pupils were equal round reactive to light extraocular movements were full, visual field were full on confrontational test. Facial sensation and strength were normal. hearing was intact to finger rubbing bilaterally. Uvula tongue midline. head turning and shoulder shrug were normal and symmetric.Tongue protrusion into cheek strength was normal. Motor: normal bulk and tone, full strength in the BUE, BLE, except mild weakness of left hand grip intrinsic muscles and left hip flexors. Sensory: normal and symmetric to light touch, pinprick,  and  Vibration, in the upper and lower extremities Coordination: finger-nose-finger,  no dysmetria Reflexes: 1+ upper lower and symmetric plantar responses were flexor bilaterally. Gait and Station: Rising up from seated position without assistance,broad-based gait, uses a wheeled walker, no difficulty with turns  DIAGNOSTIC DATA (LABS, IMAGING, TESTING) - I reviewed patient records, labs, notes, testing and imaging myself where available.  Lab Results  Component Value Date   WBC 5.4 06/25/2016   HGB 11.4 (L) 06/25/2016   HCT 34.6 (L) 06/25/2016   MCV 92.5 06/25/2016   PLT 237 06/25/2016      Component Value Date/Time   NA 141 06/25/2016 2358   K 3.9 06/25/2016 2358   CL 107 06/25/2016 2358   CO2 28 06/25/2016 2358   GLUCOSE 95 06/25/2016 2358   BUN 12 06/25/2016 2358   CREATININE 0.59 06/25/2016 2358   CALCIUM 9.3 06/25/2016 2358   PROT 7.0 06/25/2016 2358   ALBUMIN 3.7 06/25/2016 2358   AST 24 06/25/2016 2358   ALT 15 06/25/2016 2358   ALKPHOS 54 06/25/2016 2358   BILITOT 0.7 06/25/2016 2358   GFRNONAA >60 06/25/2016 2358   GFRAA >60 06/25/2016 2358    Lab Results  Component Value Date   TSH 0.757 09/04/2015      ASSESSMENT AND PLAN  83 y.o. year old female  has a past medical history of right MCA and right pontine ischemic infarction in August 2016. Due to advanced age fall risk further cardio embolic workup was deferred as she is not considered a long-term anticoagulation candidate. She is to be managed medically. According to the daughter she has a Do NOT RESUSCITATE in place.The patient is a current patient of Dr. Leonie Man  who is out of the office today . This note is sent to the work in doctor.      PLAN:  continue Plavix for secondary stroke prevention  memory score is stable Will continue to monitor over time  keep blood pressure 130/80 or less today's reading 130/72, Continue antihypertensive medications  continue Lipitor for  Elevated cholesterol secondary  stroke prevention Use walker at all times for safe ambulation For urinary tract infection make sure all antibiotic is taken  follow-up in 6 months I spent 30 in total face to face time with the patient and daughters more than 50% of which was spent counseling and coordination of care, reviewing test results reviewing medications and discussing and reviewing the diagnosis of memory loss  and stroke and further treatment options.patient does need 24 7 care. Her  memory loss is probably heightened at present due to her urinary tract infection Dennie Bible, [facility], [facility] [address], [place],  [address] [phone]

## 2016-09-06 NOTE — Telephone Encounter (Signed)
Message sent to Carolyn(NP) and Sandy(RN).Pt has appt today with NP.

## 2016-09-06 NOTE — Patient Instructions (Signed)
continue Plavix for secondary stroke prevention  memory score is stable  keep blood pressure 130/80 or less today's reading 130/72, Continue antihypertensive medications  continue Lipitor for  Elevated cholesterol Use walker at all times for safe ambulation For urinary tract infection make sure all antibiotic is taken  have  someone with you 24 7  follow-up in 6 months

## 2016-09-06 NOTE — Telephone Encounter (Signed)
noted 

## 2016-09-24 DIAGNOSIS — I10 Essential (primary) hypertension: Secondary | ICD-10-CM | POA: Diagnosis not present

## 2016-09-24 DIAGNOSIS — I69359 Hemiplegia and hemiparesis following cerebral infarction affecting unspecified side: Secondary | ICD-10-CM | POA: Diagnosis not present

## 2016-09-29 NOTE — Progress Notes (Signed)
I reviewed note and agree with plan.   VIKRAM R. PENUMALLI, MD  Certified in Neurology, Neurophysiology and Neuroimaging  [facility] [address], [address] [place], [place] [address] [phone]   

## 2016-10-18 ENCOUNTER — Ambulatory Visit (HOSPITAL_BASED_OUTPATIENT_CLINIC_OR_DEPARTMENT_OTHER): Payer: Medicare Other | Admitting: Physical Medicine & Rehabilitation

## 2016-10-18 ENCOUNTER — Encounter: Payer: Self-pay | Admitting: Physical Medicine & Rehabilitation

## 2016-10-18 ENCOUNTER — Encounter: Payer: Medicare Other | Attending: Physical Medicine & Rehabilitation

## 2016-10-18 VITALS — BP 146/70 | HR 54

## 2016-10-18 DIAGNOSIS — M25512 Pain in left shoulder: Secondary | ICD-10-CM | POA: Diagnosis not present

## 2016-10-18 DIAGNOSIS — I69319 Unspecified symptoms and signs involving cognitive functions following cerebral infarction: Secondary | ICD-10-CM | POA: Diagnosis not present

## 2016-10-18 DIAGNOSIS — I63311 Cerebral infarction due to thrombosis of right middle cerebral artery: Secondary | ICD-10-CM

## 2016-10-18 DIAGNOSIS — R414 Neurologic neglect syndrome: Secondary | ICD-10-CM | POA: Diagnosis not present

## 2016-10-18 DIAGNOSIS — I69354 Hemiplegia and hemiparesis following cerebral infarction affecting left non-dominant side: Secondary | ICD-10-CM

## 2016-10-18 NOTE — Progress Notes (Signed)
Subjective:    Patient ID: Leah FillerMabel E Figueroa, female    DOB: 1923/06/12, 83 y.o.   MRN: [id number]  HPI Left shoulder injection, subacromial performed 07/20/2016. Patient had good results and does not have recurrent pain. She continues to require the use of a walker for ambulation. No falls, although daughter states that she has had some near falls that the family has saved. Continues to require some assistance for Fenter-care. Still has left neglect. Pain Inventory Average Pain 0 Pain Right Now 0 My pain is no pain  In the last 24 hours, has pain interfered with the following? General activity 0 Relation with others 0 Enjoyment of life 0 What TIME of day is your pain at its worst? evening Sleep (in general) Fair  Pain is worse with: no pain Pain improves with: injections Relief from Meds: 7  Mobility walk with assistance use a walker ability to climb steps?  yes do you drive?  no Do you have any goals in this area?  no  Function retired I need assistance with the following:  dressing, bathing, meal prep, household duties and shopping Do you have any goals in this area?  no  Neuro/Psych trouble walking confusion depression anxiety  Prior Studies Any changes since last visit?  no  Physicians involved in your care Any changes since last visit?  no   Family History  Problem Relation Age of Onset  . Colon cancer Mother   . Breast cancer Mother   . Coronary artery disease Brother    Social History   Social History  . Marital status: Widowed    Spouse name: N/A  . Number of children: 1  . Years of education: 6311   Occupational History  . retired    Social History Main Topics  . Smoking status: Never Smoker  . Smokeless tobacco: Never Used  . Alcohol use No  . Drug use: No  . Sexual activity: No   Other Topics Concern  . None   Social History Narrative   HSG. Married '48- 57 yrs/widowed. 1 daughter - '59; 1 grandchild. 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  Lives alone. I-ADLs, manages books, pays bills.. End-of-Life Care: no heroic measures; No CPR, no mechanical ventilation.  Caffeine use/day:  None. Does Patient Exercise:  No       04/29/15 [facility]      Past Surgical History:  Procedure Laterality Date  . BACK SURGERY    . CATARACT EXTRACTION     left-2006, right 2011  . CHOLECYSTECTOMY    . intrathoracic goiter resection    . lumpectomy for breast cancer Left 80's   radiation therapy  . right thyroid lobectomy     Past Medical History:  Diagnosis Date  . Acute myocardial infarction of other anterior wall, episode of care unspecified   . Arthritis   . Cancer (HCC)    hx of breast lumpectomy   . Coronary artery disease   . Cramp of limb   . Other and unspecified hyperlipidemia   . Other specified personal history presenting hazards to health(V15.89)   . Personal history of arthritis   . Personal history of malignant neoplasm of breast   . Personal history of other diseases of digestive system   . Stroke [facility](HCC) 03/2015   "left sided issues"  . Unspecified essential hypertension   . Unspecified hypothyroidism   . Unspecified personal history presenting hazards to health    BP (!) 146/70  Pulse (!) 54   SpO2 94%   Opioid Risk Score:   Fall Risk Score:  `1  Depression screen PHQ 2/9  Depression screen [facility] 2/9 07/20/2016 01/20/2016 10/23/2015 04/29/2015  Decreased Interest 0 0 0 2  Down, Depressed, Hopeless 0 0 0 3  PHQ - 2 Score 0 0 0 5  Altered sleeping - - - 2  Tired, decreased energy - - - 0  Change in appetite - - - 3  Feeling bad or failure about yourself  - - - 2  Trouble concentrating - - - 0  Moving slowly or fidgety/restless - - - 0  Suicidal thoughts - - - 0  PHQ-9 Score - - - 12  Difficult doing work/chores - - - Not difficult at all    Review of Systems  Constitutional: Negative.   HENT: Negative.   Eyes: Negative.   Respiratory: Negative.   Cardiovascular:  Negative.   Gastrointestinal: Positive for constipation.  Endocrine: Negative.   Genitourinary: Negative.   Musculoskeletal: Negative.   Skin: Negative.   Allergic/Immunologic: Negative.   Neurological: Negative.   Hematological: Negative.   Psychiatric/Behavioral: Positive for confusion and dysphoric mood. The patient is nervous/anxious.        Objective:   Physical Exam  Constitutional: She is oriented to person, place, and time. She appears well-developed and well-nourished.  HENT:  Head: Normocephalic and atraumatic.  Eyes: Conjunctivae and EOM are normal. Pupils are equal, round, and reactive to light.  Neurological: She is alert and oriented to person, place, and time.  Psychiatric: She has a normal mood and affect.  Nursing note and vitals reviewed.   Ambulates with rolling walker. No evidence of toe drag or knee instability. Motor strength is 5/5 in the right deltoid by stress, grip, hip flexor, knee extensor, ankle dorsiflexor. 4/5 left deltoid, biceps, triceps, grip, hip flexor, knee extensor, ankle dorsiflex.      Assessment & Plan:  1. Right MCA distribution infarct with residual left neglect, stroke related gait disorder as well as mild left hemiparesis. She is already greater than one year post stroke, therefore, I do not expect any further recovery. Recommend continue home exercise program, remain active as possible.  2. Left shoulder pain improved after subacromial injection. Patient will call if she has recurrent pain.

## 2016-10-18 NOTE — Patient Instructions (Signed)
Please call if another shoulder injection as needed

## 2016-11-22 ENCOUNTER — Ambulatory Visit (HOSPITAL_BASED_OUTPATIENT_CLINIC_OR_DEPARTMENT_OTHER): Payer: Medicare Other | Admitting: Physical Medicine & Rehabilitation

## 2016-11-22 ENCOUNTER — Encounter: Payer: Medicare Other | Attending: Physical Medicine & Rehabilitation

## 2016-11-22 ENCOUNTER — Encounter: Payer: Self-pay | Admitting: Physical Medicine & Rehabilitation

## 2016-11-22 VITALS — BP 154/76 | HR 53

## 2016-11-22 DIAGNOSIS — M25512 Pain in left shoulder: Secondary | ICD-10-CM | POA: Diagnosis not present

## 2016-11-22 DIAGNOSIS — M19012 Primary osteoarthritis, left shoulder: Secondary | ICD-10-CM | POA: Diagnosis not present

## 2016-11-22 NOTE — Progress Notes (Signed)
   Shoulder injection Left  With ultrasound guidance)  Indication:Left Shoulder pain not relieved by medication management and other conservative care.  Informed consent was obtained after describing risks and benefits of the procedure with the patient, this includes bleeding, bruising, infection and medication side effects. The patient wishes to proceed and has given written consent. Patient was placed in a seated position. The Left shoulder was marked and prepped with betadine in the subacromial area. 80 mm Echo block needle was inserted into the left glenohumeral joint. After negative draw back for blood, a solution containing 1 mL of 6 mg per ML betamethasone and 4 mL of 1% lidocaine was injected. A band aid was applied. The patient tolerated the procedure well. Post procedure instructions were given.

## 2016-11-23 DIAGNOSIS — Z1389 Encounter for screening for other disorder: Secondary | ICD-10-CM | POA: Diagnosis not present

## 2016-11-23 DIAGNOSIS — E039 Hypothyroidism, unspecified: Secondary | ICD-10-CM | POA: Diagnosis not present

## 2016-11-23 DIAGNOSIS — E7800 Pure hypercholesterolemia, unspecified: Secondary | ICD-10-CM | POA: Diagnosis not present

## 2016-11-23 DIAGNOSIS — Z0000 Encounter for general adult medical examination without abnormal findings: Secondary | ICD-10-CM | POA: Diagnosis not present

## 2016-11-23 DIAGNOSIS — I10 Essential (primary) hypertension: Secondary | ICD-10-CM | POA: Diagnosis not present

## 2017-01-03 ENCOUNTER — Ambulatory Visit: Payer: Medicare Other | Admitting: Physical Medicine & Rehabilitation

## 2017-01-03 ENCOUNTER — Encounter: Payer: Medicare Other | Attending: Physical Medicine & Rehabilitation

## 2017-01-03 DIAGNOSIS — M25512 Pain in left shoulder: Secondary | ICD-10-CM | POA: Insufficient documentation

## 2017-03-07 ENCOUNTER — Encounter: Payer: Self-pay | Admitting: Nurse Practitioner

## 2017-03-07 ENCOUNTER — Ambulatory Visit (INDEPENDENT_AMBULATORY_CARE_PROVIDER_SITE_OTHER): Payer: Medicare Other | Admitting: Nurse Practitioner

## 2017-03-07 VITALS — BP 127/77 | HR 79 | Wt 99.4 lb

## 2017-03-07 DIAGNOSIS — R269 Unspecified abnormalities of gait and mobility: Secondary | ICD-10-CM | POA: Diagnosis not present

## 2017-03-07 DIAGNOSIS — E785 Hyperlipidemia, unspecified: Secondary | ICD-10-CM | POA: Diagnosis not present

## 2017-03-07 DIAGNOSIS — I10 Essential (primary) hypertension: Secondary | ICD-10-CM | POA: Diagnosis not present

## 2017-03-07 DIAGNOSIS — Z8673 Personal history of transient ischemic attack (TIA), and cerebral infarction without residual deficits: Secondary | ICD-10-CM

## 2017-03-07 DIAGNOSIS — I63311 Cerebral infarction due to thrombosis of right middle cerebral artery: Secondary | ICD-10-CM

## 2017-03-07 NOTE — Progress Notes (Signed)
I reviewed above note and agree with the assessment and plan.  Marvel PlanJindong Blondie Riggsbee, MD PhD Stroke Neurology 03/07/2017 6:17 PM

## 2017-03-07 NOTE — Progress Notes (Signed)
[facility]  PATIENT: Leah Figueroa DOB: 1923-01-23   REASON FOR VISIT: follow-up for history of stroke, Mild cognitive impairment, recent worsening memory HISTORY FROM:patient and   Daughter Leah Figueroa     HISTORY OF PRESENT ILLNESS: HISTORY PS15 year old female here for evaluation and follow-up from stroke in August 2016. Patient had onset of leaning to the left side, falling to the left, taken to the hospital via EMS code stroke was activated. Patient was evaluated and treated with IV TPA. She was admitted to the hospital for further evaluation. Patient was found to have right MCA ischemic infarction with right pontine ischemic infarction. Carotid Doppler showed no significant stenosis. Echocardiogram showed no cardioembolic source of emboli. LDL was 94 and hemoglobin A1c was 5.8. Patient was on aspirin 81 mg daily and this was changed to Plavix. Patient went to inpatient rehabilitation, now at [facility] for skilled nursing rehabilitation. Patient planning to be discharged within next week or 2.  Since discharge patient is doing well. No new symptoms, headaches, pain or other problems. She has made slow steady improvement over the last month.  Update 03/02/2016 : PS She returns for follow-up after last visit 6 months ago. She is accompanied by her daughter provides most of the history. Patient seems to doing well. She has had no significant falls and injuries and she does use a walker though daughter feels she is not careful when she is first getting up or sitting down. Fortunately she had no major injuries. Her blood pressure is well controlled and today it is 115/67. She is tolerating Lipitor well without muscle aches or pain. She remains on Plavix and has not had any significant bleeding and only minor bruising. The daughter noticed that the patient is not moving her left side as well the patient herself denies this. The patient is requiring more help with activities of  daily living. Patient's daughter spends 2 nights with her and there is another caregiver for the remaining nights. She does get more confused and does have some sundowning. UPDATE 01/22/2018CM Leah Figueroa, 83 year old female returns for follow-up with HER-2 daughters. They report that she has had worsening of her memory lately however she is currently being treated for urinary tract infection on antibiotics. Memory score is actually 26 out of 30. She ambulates with rolling walker no recent falls. She has a history of macular degeneration. Blood pressure is well controlled in the office today at 130/72.She remains on Lipitor and according to the daughter her labs have been stable with Dr. Felipa Eth.she remains on Plavix for secondary stroke prevention with minimal bruising.patient is currently living at home with the daughters assisting with care 24 7 She returns for reevaluation UPDATE 07/23/2018Cm Leah Figueroa, 83 year old female returns for follow-up. She has a history of stroke event in 2016. Her memory is stable according to the daughter she remains on Plavix for secondary stroke prevention with minimal bruising. She has not had further stroke or TIA symptoms. She remains on Lipitor without myalgias. Blood pressure in the office today 127/77. Appetite is fair she is sleeping well at night. She requires 24 /7 care. She ambulates with a rolling walker without recent falls. She has macular degeneration. She returns for reevaluation  REVIEW OF SYSTEMS: Full 14 system review of systems performed and notable only for those listed, all others are neg:  Constitutional: neg  Cardiovascular: neg Ear/Nose/Throat: hearing loss  Skin: neg Eyes: macular degeneration Respiratory: neg Gastroitestinal: neg  Hematology/Lymphatic:easy bruising Endocrine: neg Musculoskeletal:neg Allergy/Immunology:  neg Neurological: Memory loss Psychiatric: depression and anxiety Sleep : neg   ALLERGIES: No Known Allergies  HOME  MEDICATIONS: Outpatient Medications Prior to Visit  Medication Sig Dispense Refill  . acetaminophen (TYLENOL) 325 MG tablet Take 2 tablets (650 mg total) by mouth every 4 (four) hours as needed for mild pain.    Marland Kitchen ALPRAZolam (XANAX) 0.25 MG tablet TAKE 1 TABLET BY MOUTH AT NIGHT AS NEEDED FOR ANXIETY/SLEEP  0  . amLODipine (NORVASC) 5 MG tablet Take 5 mg by mouth daily.  6  . atorvastatin (LIPITOR) 10 MG tablet Take 10 mg by mouth daily.    . Cholecalciferol (VITAMIN D3) 1000 units CAPS Take 5,000 Units by mouth daily.     . clopidogrel (PLAVIX) 75 MG tablet Take 1 tablet (75 mg total) by mouth daily.    . ferrous sulfate 325 (65 FE) MG tablet Take 325 mg by mouth daily.  5  . levothyroxine (SYNTHROID, LEVOTHROID) 25 MCG tablet Take 1 tablet by mouth  daily 90 tablet 0  . Melatonin 10 MG TABS Take 1 tablet by mouth at bedtime.    . metoprolol (LOPRESSOR) 50 MG tablet Take 50 mg by mouth 2 (two) times daily.  3  . vitamin B-12 (CYANOCOBALAMIN) 1000 MCG tablet Take 1,000 mcg by mouth daily.     Marland Kitchen CRANBERRY-VITAMIN C PO Take 4,200 mg by mouth daily.     . ciprofloxacin (CIPRO) 500 MG tablet      No facility-administered medications prior to visit.     PAST MEDICAL HISTORY: Past Medical History:  Diagnosis Date  . Acute myocardial infarction of other anterior wall, episode of care unspecified   . Arthritis   . Cancer (HCC)    hx of breast lumpectomy   . Coronary artery disease   . Cramp of limb   . Other and unspecified hyperlipidemia   . Other specified personal history presenting hazards to health(V15.89)   . Personal history of arthritis   . Personal history of malignant neoplasm of breast   . Personal history of other diseases of digestive system   . Stroke [facility]) 03/2015   "left sided issues"  . Unspecified essential hypertension   . Unspecified hypothyroidism   . Unspecified personal history presenting hazards to health     PAST SURGICAL HISTORY: Past Surgical History:    Procedure Laterality Date  . BACK SURGERY    . CATARACT EXTRACTION     left-2006, right 2011  . CHOLECYSTECTOMY    . intrathoracic goiter resection    . lumpectomy for breast cancer Left 80's   radiation therapy  . right thyroid lobectomy      FAMILY HISTORY: Family History  Problem Relation Age of Onset  . Colon cancer Mother   . Breast cancer Mother   . Coronary artery disease Brother   . Dementia Sister     SOCIAL HISTORY: Social History   Social History  . Marital status: Widowed    Spouse name: N/A  . Number of children: 1  . Years of education: 36   Occupational History  . retired    Social History Main Topics  . Smoking status: Never Smoker  . Smokeless tobacco: Never Used  . Alcohol use No  . Drug use: No  . Sexual activity: No   Other Topics Concern  . Not on file   Social History Narrative   HSG. Married '48- 57 yrs/widowed. 1 daughter - '59; 1 grandchild. 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  Lives alone. I-ADLs, manages books, pays bills.. End-of-Life Care: no heroic measures; No CPR, no mechanical ventilation.  Caffeine use/day:  None. Does Patient Exercise:  No       03/07/17 lives with daughter, Leah Figueroa        PHYSICAL EXAM  Vitals:   03/07/17 1314  BP: 127/77  Pulse: 79  Weight: 99 lb 6.4 oz (45.1 kg)   Body mass index is 20.08 kg/m.  Generalized: Well developed, frail female in no acute distress  Head: normocephalic and atraumatic,. Oropharynx benign  Neck: Supple, no carotid bruits  Cardiac: Regular rate rhythm, no murmur  Musculoskeletal: mild kyphosis  Neurological examination   Mentation: Alert oriented to time, not the clinicplace, history taking.MMSE not repeated. MMSE - Mini Mental State Exam 09/06/2016 04/29/2015  Orientation to time 5 5  Orientation to Place 4 5  Registration 3 3  Attention/ Calculation 4 0  Recall 2 2  Language- name 2 objects 2 2  Language- repeat 1 0  Language- follow 3 step command 3 3   Language- read & follow direction 1 1  Write a sentence 1 1  Copy design 0 0  Total score 26 22     Follows all commands speech and language fluent.   Cranial nerve II-XII: Pupils were equal round reactive to light extraocular movements were full, visual field were full on confrontational test. Facial sensation and strength were normal. hearing was intact to finger rubbing bilaterally. Uvula tongue midline. head turning and shoulder shrug were normal and symmetric.Tongue protrusion into cheek strength was normal. Motor: normal bulk and tone, full strength in the BUE, BLE, except mild weakness of left hand grip intrinsic muscles and left hip flexors. Sensory: normal and symmetric to light touch, pinprick, and  Vibration, in the upper and lower extremities Coordination: finger-nose-finger,  no dysmetria Reflexes: 1+ upper lower and symmetric plantar responses were flexor bilaterally. Gait and Station: Rising up from seated position without assistance,broad-based gait, uses a wheeled walker, no difficulty with turns  DIAGNOSTIC DATA (LABS, IMAGING, TESTING) - I reviewed patient records, labs, notes, testing and imaging myself where available.  Lab Results  Component Value Date   WBC 5.4 06/25/2016   HGB 11.4 (L) 06/25/2016   HCT 34.6 (L) 06/25/2016   MCV 92.5 06/25/2016   PLT 237 06/25/2016      Component Value Date/Time   NA 141 06/25/2016 2358   K 3.9 06/25/2016 2358   CL 107 06/25/2016 2358   CO2 28 06/25/2016 2358   GLUCOSE 95 06/25/2016 2358   BUN 12 06/25/2016 2358   CREATININE 0.59 06/25/2016 2358   CALCIUM 9.3 06/25/2016 2358   PROT 7.0 06/25/2016 2358   ALBUMIN 3.7 06/25/2016 2358   AST 24 06/25/2016 2358   ALT 15 06/25/2016 2358   ALKPHOS 54 06/25/2016 2358   BILITOT 0.7 06/25/2016 2358   GFRNONAA >60 06/25/2016 2358   GFRAA >60 06/25/2016 2358    Lab Results  Component Value Date   TSH 0.757 09/04/2015      ASSESSMENT AND PLAN  83 y.o. year old female   has a past medical history of right MCA and right pontine ischemic infarction in August 2016. Due to advanced age fall risk further cardio embolic workup was deferred as she is not considered a long-term anticoagulation candidate. She is to be managed medically. According to the daughter she has a Do NOT RESUSCITATE in place.The patient is a current patient of Dr. Leonie Man  who is out of  the office today . This note is sent to the work in doctor.      PLAN:  continue Plavix for secondary stroke prevention  keep blood pressure 130/80 or less today's reading 127/77 Continue antihypertensive medications  continue Lipitor for  Elevated cholesterol secondary stroke prevention, Labs followed by primary care Use walker at all times for safe ambulation 24/ 7 care at all times Will discharge from stroke clinic  I spent 25 minutes in total face to face time with the patient more than 50% of which was spent counseling and coordination of care, reviewing test results reviewing medications and discussing and reviewing the diagnosis of stroke and management of risk factors. Printed  information given Dennie Bible, [facility], [facility], APRN  [facility] [address] [place], [place] [address] [phone]

## 2017-03-07 NOTE — Patient Instructions (Addendum)
continue Plavix for secondary stroke prevention  keep blood pressure 130/80 or less today's reading 127/77 Continue antihypertensive medications  continue Lipitor for  Elevated cholesterol secondary stroke prevention, Labs followed by primary care Use walker at all times for safe ambulation 24 7 care at all times Will discharge from stroke clinic  Stroke Prevention Some health problems and behaviors may make it more likely for you to have a stroke. Below are ways to lessen your risk of having a stroke.  Be active for at least 30 minutes on most or all days.  Do not smoke. Try not to be around others who smoke.  Do not drink too much alcohol. ? Do not have more than 2 drinks a day if you are a man. ? Do not have more than 1 drink a day if you are a woman and are not pregnant.  Eat healthy foods, such as fruits and vegetables. If you were put on a specific diet, follow the diet as told.  Keep your cholesterol levels under control through diet and medicines. Look for foods that are low in saturated fat, trans fat, cholesterol, and are high in fiber.  If you have diabetes, follow all diet plans and take your medicine as told.  Ask your doctor if you need treatment to lower your blood pressure. If you have high blood pressure (hypertension), follow all diet plans and take your medicine as told by your doctor.  If you are 7318-81 years old, have your blood pressure checked every 3-5 years. If you are age 81 or older, have your blood pressure checked every year.  Keep a healthy weight. Eat foods that are low in calories, salt, saturated fat, trans fat, and cholesterol.  Do not take drugs.  Avoid birth control pills, if this applies. Talk to your doctor about the risks of taking birth control pills.  Talk to your doctor if you have sleep problems (sleep apnea).  Take all medicine as told by your doctor. ? You may be told to take aspirin or blood thinner medicine. Take this medicine as told  by your doctor. ? Understand your medicine instructions.  Make sure any other conditions you have are being taken care of.  Get help right away if:  You suddenly lose feeling (you feel numb) or have weakness in your face, arm, or leg.  Your face or eyelid hangs down to one side.  You suddenly feel confused.  You have trouble talking (aphasia) or understanding what people are saying.  You suddenly have trouble seeing in one or both eyes.  You suddenly have trouble walking.  You are dizzy.  You lose your balance or your movements are clumsy (uncoordinated).  You suddenly have a very bad headache and you do not know the cause.  You have new chest pain.  Your heart feels like it is fluttering or skipping a beat (irregular heartbeat). Do not wait to see if the symptoms above go away. Get help right away. Call your local emergency services (911 in [place] Do not drive yourself to the hospital. This information is not intended to replace advice given to you by your health care provider. Make sure you discuss any questions you have with your health care provider. Document Released: 02/01/2012 Document Revised: 01/08/2016 Document Reviewed: 02/02/2013 Elsevier Interactive Patient Education  [employer]2018 Elsevier Inc.

## 2017-04-22 ENCOUNTER — Encounter: Payer: Self-pay | Admitting: Physical Medicine & Rehabilitation

## 2017-04-22 ENCOUNTER — Encounter: Payer: Medicare Other | Attending: Physical Medicine & Rehabilitation

## 2017-04-22 ENCOUNTER — Ambulatory Visit (HOSPITAL_BASED_OUTPATIENT_CLINIC_OR_DEPARTMENT_OTHER): Payer: Medicare Other | Admitting: Physical Medicine & Rehabilitation

## 2017-04-22 VITALS — BP 119/66 | HR 61

## 2017-04-22 DIAGNOSIS — Z8673 Personal history of transient ischemic attack (TIA), and cerebral infarction without residual deficits: Secondary | ICD-10-CM

## 2017-04-22 DIAGNOSIS — M19012 Primary osteoarthritis, left shoulder: Secondary | ICD-10-CM

## 2017-04-22 DIAGNOSIS — M25512 Pain in left shoulder: Secondary | ICD-10-CM | POA: Insufficient documentation

## 2017-04-22 DIAGNOSIS — I10 Essential (primary) hypertension: Secondary | ICD-10-CM | POA: Diagnosis not present

## 2017-04-22 DIAGNOSIS — Z853 Personal history of malignant neoplasm of breast: Secondary | ICD-10-CM | POA: Insufficient documentation

## 2017-04-22 DIAGNOSIS — I63311 Cerebral infarction due to thrombosis of right middle cerebral artery: Secondary | ICD-10-CM

## 2017-04-22 DIAGNOSIS — Z9049 Acquired absence of other specified parts of digestive tract: Secondary | ICD-10-CM | POA: Diagnosis not present

## 2017-04-22 NOTE — Patient Instructions (Addendum)
Please call if left shoulder pain worsens, especially if it hurts with overhead reaching  You must use the walker at all times to avoid falls and injury  Erick ColaceAndrew E. Denette Hass M.D.  [facility] FAAPM&R (Sports Med, Neuromuscular Med) Diplomate Am Board of Electrodiagnostic Med

## 2017-04-22 NOTE — Progress Notes (Signed)
Subjective:    Patient ID: Leah Figueroa, female    DOB: 1923/04/29, 83 y.o.   MRN: [id number]  HPI Larey Seat after she left walker in other room  Pt has Left shoulder pain when she sleeps on it, pain usually increased around supper time Dresses [place] no pain  Pain Inventory Average Pain 9 only at night Pain Right Now 0 My pain is intermittent, sharp and aching  In the last 24 hours, has pain interfered with the following? General activity 0 Relation with others 0 Enjoyment of life 0 What TIME of day is your pain at its worst? night Sleep (in general) .  Pain is worse with: at night Pain improves with: rest Relief from Meds: na  Mobility use a walker ability to climb steps?  yes do you drive?  no  Function retired  Neuro/Psych trouble walking confusion depression anxiety  Prior Studies Any changes since last visit?  no  Physicians involved in your care Any changes since last visit?  no   Family History  Problem Relation Age of Onset  . Colon cancer Mother   . Breast cancer Mother   . Coronary artery disease Brother   . Dementia Sister    Social History   Social History  . Marital status: Widowed    Spouse name: N/A  . Number of children: 1  . Years of education: 69   Occupational History  . retired    Social History Main Topics  . Smoking status: Never Smoker  . Smokeless tobacco: Never Used  . Alcohol use No  . Drug use: No  . Sexual activity: No   Other Topics Concern  . Not on file   Social History Narrative   HSG. Married '48- 57 yrs/widowed. 1 daughter - '59; 1 grandchild. 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  Lives alone. I-ADLs, manages books, pays bills.. End-of-Life Care: no heroic measures; No CPR, no mechanical ventilation.  Caffeine use/day:  None. Does Patient Exercise:  No       03/07/17 lives with daughter, [name]      Past Surgical History:  Procedure Laterality Date  . BACK SURGERY    . CATARACT EXTRACTION     left-2006,  right 2011  . CHOLECYSTECTOMY    . intrathoracic goiter resection    . lumpectomy for breast cancer Left 80's   radiation therapy  . right thyroid lobectomy     Past Medical History:  Diagnosis Date  . Acute myocardial infarction of other anterior wall, episode of care unspecified   . Arthritis   . Cancer (HCC)    hx of breast lumpectomy   . Coronary artery disease   . Cramp of limb   . Other and unspecified hyperlipidemia   . Other specified personal history presenting hazards to health(V15.89)   . Personal history of arthritis   . Personal history of malignant neoplasm of breast   . Personal history of other diseases of digestive system   . Stroke [facility]) 03/2015   "left sided issues"  . Unspecified essential hypertension   . Unspecified hypothyroidism   . Unspecified personal history presenting hazards to health    There were no vitals taken for this visit.  Opioid Risk Score:   Fall Risk Score:  `1  Depression screen PHQ 2/9  Depression screen [facility] 2/9 07/20/2016 01/20/2016 10/23/2015 04/29/2015  Decreased Interest 0 0 0 2  Down, Depressed, Hopeless 0 0 0 3  PHQ - 2 Score 0 0 0 5  Altered sleeping - - - 2  Tired, decreased energy - - - 0  Change in appetite - - - 3  Feeling bad or failure about yourself  - - - 2  Trouble concentrating - - - 0  Moving slowly or fidgety/restless - - - 0  Suicidal thoughts - - - 0  PHQ-9 Score - - - 12  Difficult doing work/chores - - - Not difficult at all     Review of Systems  Constitutional: Positive for appetite change.  HENT: Negative.   Eyes: Negative.   Respiratory: Negative.   Cardiovascular: Negative.   Gastrointestinal: Negative.   Endocrine: Negative.   Genitourinary: Negative.   Musculoskeletal: Negative.   Skin: Negative.   Allergic/Immunologic: Negative.   Neurological: Negative.   Hematological: Bruises/bleeds easily.  Psychiatric/Behavioral: Negative.   All other systems reviewed and are negative.        Objective:   Physical Exam  Negative impingement sign. No upper extremity edema. Motor strength is 4/5 in the left deltoid, bicep, triceps, grip, 5/5 in the right deltoid, biceps, triceps, grip Cervical range of motion full with flexion, extension, lateral rotation and bending. Ambulates with handheld assist. She leans heavily on the left side. She also tends to lose her balance more to the right side.      Assessment & Plan:  1.  History of right MCA infarct with left hemiparesis and left neglect- has plateaued in fxn and requires walker whenever up.  2.  Left shoulder pain- currently no evidence of impingement, there is increased pain in the evening and I have recommended topical OTC patches, heat and or ice as well as tylenol. No injection needed at this time

## 2017-05-02 ENCOUNTER — Encounter (HOSPITAL_COMMUNITY): Payer: Self-pay

## 2017-05-02 ENCOUNTER — Emergency Department (HOSPITAL_COMMUNITY): Payer: Medicare Other

## 2017-05-02 ENCOUNTER — Inpatient Hospital Stay (HOSPITAL_COMMUNITY)
Admission: EM | Admit: 2017-05-02 | Discharge: 2017-05-05 | DRG: 069 | Disposition: A | Discharge status: Home / Self Care | Payer: Medicare Other | Attending: Family Medicine | Admitting: Family Medicine

## 2017-05-02 DIAGNOSIS — Z23 Encounter for immunization: Secondary | ICD-10-CM

## 2017-05-02 DIAGNOSIS — Z7902 Long term (current) use of antithrombotics/antiplatelets: Secondary | ICD-10-CM

## 2017-05-02 DIAGNOSIS — R29818 Other symptoms and signs involving the nervous system: Secondary | ICD-10-CM | POA: Diagnosis not present

## 2017-05-02 DIAGNOSIS — H53469 Homonymous bilateral field defects, unspecified side: Secondary | ICD-10-CM | POA: Diagnosis present

## 2017-05-02 DIAGNOSIS — H5340 Unspecified visual field defects: Secondary | ICD-10-CM | POA: Diagnosis present

## 2017-05-02 DIAGNOSIS — I252 Old myocardial infarction: Secondary | ICD-10-CM

## 2017-05-02 DIAGNOSIS — N390 Urinary tract infection, site not specified: Secondary | ICD-10-CM | POA: Diagnosis not present

## 2017-05-02 DIAGNOSIS — G451 Carotid artery syndrome (hemispheric): Secondary | ICD-10-CM

## 2017-05-02 DIAGNOSIS — I69351 Hemiplegia and hemiparesis following cerebral infarction affecting right dominant side: Secondary | ICD-10-CM

## 2017-05-02 DIAGNOSIS — I6789 Other cerebrovascular disease: Secondary | ICD-10-CM | POA: Diagnosis not present

## 2017-05-02 DIAGNOSIS — Z66 Do not resuscitate: Secondary | ICD-10-CM | POA: Diagnosis present

## 2017-05-02 DIAGNOSIS — D509 Iron deficiency anemia, unspecified: Secondary | ICD-10-CM | POA: Diagnosis present

## 2017-05-02 DIAGNOSIS — Z8673 Personal history of transient ischemic attack (TIA), and cerebral infarction without residual deficits: Secondary | ICD-10-CM

## 2017-05-02 DIAGNOSIS — G8191 Hemiplegia, unspecified affecting right dominant side: Secondary | ICD-10-CM | POA: Diagnosis not present

## 2017-05-02 DIAGNOSIS — R531 Weakness: Secondary | ICD-10-CM | POA: Diagnosis not present

## 2017-05-02 DIAGNOSIS — G459 Transient cerebral ischemic attack, unspecified: Secondary | ICD-10-CM | POA: Diagnosis not present

## 2017-05-02 DIAGNOSIS — Z79899 Other long term (current) drug therapy: Secondary | ICD-10-CM

## 2017-05-02 DIAGNOSIS — R4781 Slurred speech: Secondary | ICD-10-CM | POA: Diagnosis present

## 2017-05-02 DIAGNOSIS — E785 Hyperlipidemia, unspecified: Secondary | ICD-10-CM | POA: Diagnosis present

## 2017-05-02 DIAGNOSIS — B961 Klebsiella pneumoniae [K. pneumoniae] as the cause of diseases classified elsewhere: Secondary | ICD-10-CM | POA: Diagnosis present

## 2017-05-02 DIAGNOSIS — Z853 Personal history of malignant neoplasm of breast: Secondary | ICD-10-CM

## 2017-05-02 DIAGNOSIS — I10 Essential (primary) hypertension: Secondary | ICD-10-CM | POA: Diagnosis present

## 2017-05-02 DIAGNOSIS — Z8249 Family history of ischemic heart disease and other diseases of the circulatory system: Secondary | ICD-10-CM

## 2017-05-02 DIAGNOSIS — I2510 Atherosclerotic heart disease of native coronary artery without angina pectoris: Secondary | ICD-10-CM | POA: Diagnosis present

## 2017-05-02 DIAGNOSIS — E039 Hypothyroidism, unspecified: Secondary | ICD-10-CM | POA: Diagnosis present

## 2017-05-02 DIAGNOSIS — Z803 Family history of malignant neoplasm of breast: Secondary | ICD-10-CM

## 2017-05-02 DIAGNOSIS — M1990 Unspecified osteoarthritis, unspecified site: Secondary | ICD-10-CM | POA: Diagnosis present

## 2017-05-02 DIAGNOSIS — Z800 Family history of malignant neoplasm of digestive organs: Secondary | ICD-10-CM

## 2017-05-02 DIAGNOSIS — F0390 Unspecified dementia without behavioral disturbance: Secondary | ICD-10-CM | POA: Diagnosis present

## 2017-05-02 LAB — DIFFERENTIAL
Basophils Absolute: 0 10*3/uL (ref 0.0–0.1)
Basophils Relative: 0 %
EOS ABS: 0.1 10*3/uL (ref 0.0–0.7)
EOS PCT: 2 %
LYMPHS ABS: 1.7 10*3/uL (ref 0.7–4.0)
LYMPHS PCT: 30 %
MONO ABS: 0.5 10*3/uL (ref 0.1–1.0)
Monocytes Relative: 10 %
NEUTROS PCT: 58 %
Neutro Abs: 3.3 10*3/uL (ref 1.7–7.7)

## 2017-05-02 LAB — URINALYSIS, ROUTINE W REFLEX MICROSCOPIC
BILIRUBIN URINE: NEGATIVE
Glucose, UA: NEGATIVE mg/dL
KETONES UR: NEGATIVE mg/dL
NITRITE: NEGATIVE
Protein, ur: NEGATIVE mg/dL
SPECIFIC GRAVITY, URINE: 1.012 (ref 1.005–1.030)
pH: 5 (ref 5.0–8.0)

## 2017-05-02 LAB — COMPREHENSIVE METABOLIC PANEL
ALK PHOS: 56 U/L (ref 38–126)
ALT: 18 U/L (ref 14–54)
ANION GAP: 6 (ref 5–15)
AST: 27 U/L (ref 15–41)
Albumin: 3.7 g/dL (ref 3.5–5.0)
BILIRUBIN TOTAL: 0.6 mg/dL (ref 0.3–1.2)
BUN: 13 mg/dL (ref 6–20)
CALCIUM: 9.1 mg/dL (ref 8.9–10.3)
CO2: 25 mmol/L (ref 22–32)
Chloride: 106 mmol/L (ref 101–111)
Creatinine, Ser: 0.76 mg/dL (ref 0.44–1.00)
GFR calc non Af Amer: >60 mL/min (ref >60)
Glucose, Bld: 173 mg/dL — ABNORMAL HIGH (ref 65–99)
POTASSIUM: 3.9 mmol/L (ref 3.5–5.1)
Sodium: 137 mmol/L (ref 135–145)
TOTAL PROTEIN: 6.7 g/dL (ref 6.5–8.1)

## 2017-05-02 LAB — CBC
HCT: 39.7 % (ref 36.0–46.0)
HEMOGLOBIN: 12.8 g/dL (ref 12.0–15.0)
MCH: 30.9 pg (ref 26.0–34.0)
MCHC: 32.2 g/dL (ref 30.0–36.0)
MCV: 95.9 fL (ref 78.0–100.0)
PLATELETS: 211 10*3/uL (ref 150–400)
RBC: 4.14 MIL/uL (ref 3.87–5.11)
RDW: 12.7 % (ref 11.5–15.5)
WBC: 5.7 10*3/uL (ref 4.0–10.5)

## 2017-05-02 LAB — I-STAT CHEM 8, ED
BUN: 15 mg/dL (ref 6–20)
CALCIUM ION: 1.13 mmol/L — AB (ref 1.15–1.40)
CHLORIDE: 104 mmol/L (ref 101–111)
Creatinine, Ser: 0.8 mg/dL (ref 0.44–1.00)
GLUCOSE: 172 mg/dL — AB (ref 65–99)
HCT: 37 % (ref 36.0–46.0)
Hemoglobin: 12.6 g/dL (ref 12.0–15.0)
Potassium: 3.9 mmol/L (ref 3.5–5.1)
SODIUM: 142 mmol/L (ref 135–145)
TCO2: 27 mmol/L (ref 22–32)

## 2017-05-02 LAB — I-STAT TROPONIN, ED: TROPONIN I, POC: 0.01 ng/mL (ref 0.00–0.08)

## 2017-05-02 LAB — RAPID URINE DRUG SCREEN, HOSP PERFORMED
Amphetamines: NOT DETECTED
Barbiturates: NOT DETECTED
Benzodiazepines: POSITIVE — AB
COCAINE: NOT DETECTED
OPIATES: NOT DETECTED
TETRAHYDROCANNABINOL: NOT DETECTED

## 2017-05-02 LAB — APTT: aPTT: 27 seconds (ref 24–36)

## 2017-05-02 LAB — PROTIME-INR
INR: 1.02
Prothrombin Time: 13.3 seconds (ref 11.4–15.2)

## 2017-05-02 LAB — CBG MONITORING, ED: Glucose-Capillary: 177 mg/dL — ABNORMAL HIGH (ref 65–99)

## 2017-05-02 MED ORDER — DEXTROSE 5 % IV SOLN
1.0000 g | INTRAVENOUS | Status: DC
  Administered 2017-05-03 – 2017-05-05 (×3): 1 g via INTRAVENOUS
  Filled 2017-05-02 (×5): qty 10

## 2017-05-02 NOTE — ED Triage Notes (Signed)
Patient here for an activated Code Stroke by EMS.  LSN was [id number].  Has hx of previous stroke with residual left sided vision changes.  Per EMS patient has right sided weakness and slurred speech.  All of which had resolved prior to arrival.  CT complete and patient to be admitted for TIA workup.

## 2017-05-02 NOTE — Consult Note (Signed)
Neurology Consult Referring Physician: Dr. Erin Hearing Chief Complaint: transient slurred speech and right-sided weakness.  HPI: Leah Figueroa is an 83 y.o. female with a history of right parietal stroke,hypertension, hypothyroidism, breast cancer and arthritis, brought to the ED in code stroke status following acute onset of right side weakness and slurred speech. She was last known well at 5:45 PM. She's been taking Plavix daily. Deficits subsequently resolved. She has known visual problems and sensory changes on the left side since her previous stroke 2 years ago. CT scan of her head showed no acute abnormalities. NIH stroke score was 4  LSN: [id number] on 05/02/2017 tPA Given: No: acute deficits resolved mRankin:  Past Medical History:  Diagnosis Date  . Acute myocardial infarction of other anterior wall, episode of care unspecified   . Arthritis   . Cancer (HCC)    hx of breast lumpectomy   . Coronary artery disease   . Cramp of limb   . Other and unspecified hyperlipidemia   . Other specified personal history presenting hazards to health(V15.89)   . Personal history of arthritis   . Personal history of malignant neoplasm of breast   . Personal history of other diseases of digestive system   . Stroke [facility] 03/2015   "left sided issues"  . Unspecified essential hypertension   . Unspecified hypothyroidism   . Unspecified personal history presenting hazards to health     Past Surgical History:  Procedure Laterality Date  . BACK SURGERY    . CATARACT EXTRACTION     left-2006, right 2011  . CHOLECYSTECTOMY    . intrathoracic goiter resection    . lumpectomy for breast cancer Left 80's   radiation therapy  . right thyroid lobectomy      Family History  Problem Relation Age of Onset  . Colon cancer Mother   . Breast cancer Mother   . Coronary artery disease Brother   . Dementia Sister    Social History:  reports that she has never smoked. She has never used smokeless tobacco. She  reports that she does not drink alcohol or use drugs.  Allergies: No Known Allergies  Medications: Preadmission medications were reviewed by me.  ROS: History obtained from the patient  General ROS: negative for - chills, fatigue, fever, night sweats, weight gain or weight loss Psychological ROS: negative for - behavioral disorder, hallucinations, memory difficulties, mood swings or suicidal ideation Ophthalmic ROS: negative for - blurry vision, double vision, eye pain or loss of vision ENT ROS: negative for - epistaxis, nasal discharge, oral lesions, sore throat, tinnitus or vertigo Allergy and Immunology ROS: negative for - hives or itchy/watery eyes Hematological and Lymphatic ROS: negative for - bleeding problems, bruising or swollen lymph nodes Endocrine ROS: negative for - galactorrhea, hair pattern changes, polydipsia/polyuria or temperature intolerance Respiratory ROS: negative for - cough, hemoptysis, shortness of breath or wheezing Cardiovascular ROS: negative for - chest pain, dyspnea on exertion, edema or irregular heartbeat Gastrointestinal ROS: negative for - abdominal pain, diarrhea, hematemesis, nausea/vomiting or stool incontinence Genito-Urinary ROS: negative for - dysuria, hematuria, incontinence or urinary frequency/urgency Musculoskeletal ROS: left shoulder pain from recent injury. Neurological ROS: as noted in HPI Dermatological ROS: negative for rash and skin lesion changes  Physical Examination: Blood pressure 134/79, pulse 76, resp. rate (!) 21, weight 45.3 kg (99 lb 13.9 oz), SpO2 97 %.  HEENT-  Normocephalic, no lesions, without obvious abnormality.  Normal external eye and conjunctiva.  Normal TM's bilaterally.  Normal auditory canals  and external ears. Normal external nose, mucus membranes and septum.  Normal pharynx. Neck supple with no masses, nodes, nodules or enlargement. Cardiovascular - regular rate and rhythm, S1, S2 normal, no murmur, click, rub or  gallop Lungs - chest clear, no wheezing, rales, normal symmetric air entry Abdomen - soft, non-tender; bowel sounds normal; no masses,  no organomegaly Extremities - no joint deformities, effusion, or inflammation  Neurologic Examination: Mental Status: Alert, oriented, thought content appropriate.  Speech fluent without evidence of aphasia. Able to follow commands without difficulty. Cranial Nerves: II-left homonymous hemianopsia noted. III/IV/VI-Pupils were equal and reacted. Extraocular movements were full and conjugate.    V/VII-mild left facial numbness; no facial weakness. VIII-normal. X-normal speech and symmetrical palatal movement. XI: trapezius strength/neck flexion strength normal bilaterally XII-midline tongue extension with normal strength. Motor: 5/5 bilaterally with normal tone and bulk Sensory: reduced perception of tactile stimulation over left extremities compared to right extremities. Deep Tendon Reflexes: 2+ and symmetric. Plantars: Mute bilaterally Cerebellar: Normal finger-to-nose testing. Carotid auscultation: Normal  Results for orders placed or performed during the hospital encounter of 05/02/17 (from the past 48 hour(s))  CBG monitoring, ED     Status: Abnormal   Collection Time: 05/02/17  7:52 PM  Result Value Ref Range   Glucose-Capillary 177 (H) 65 - 99 mg/dL  I-stat troponin, ED     Status: None   Collection Time: 05/02/17  8:04 PM  Result Value Ref Range   Troponin i, poc 0.01 0.00 - 0.08 ng/mL   Comment 3            Comment: Due to the release kinetics of cTnI, a negative result within the first hours of the onset of symptoms does not rule out myocardial infarction with certainty. If myocardial infarction is still suspected, repeat the test at appropriate intervals.   I-Stat Chem 8, ED     Status: Abnormal   Collection Time: 05/02/17  8:05 PM  Result Value Ref Range   Sodium 142 135 - 145 mmol/L   Potassium 3.9 3.5 - 5.1 mmol/L   Chloride  104 101 - 111 mmol/L   BUN 15 6 - 20 mg/dL   Creatinine, Ser 1.61 0.44 - 1.00 mg/dL   Glucose, Bld 096 (H) 65 - 99 mg/dL   Calcium, Ion 0.45 (L) 1.15 - 1.40 mmol/L   TCO2 27 22 - 32 mmol/L   Hemoglobin 12.6 12.0 - 15.0 g/dL   HCT 40.9 81.1 - 91.4 %   Ct Head Code Stroke Wo Contrast  Result Date: 05/02/2017 CLINICAL DATA:  Code stroke. Initial evaluation for acute right-sided weakness. EXAM: CT HEAD WITHOUT CONTRAST TECHNIQUE: Contiguous axial images were obtained from the base of the skull through the vertex without intravenous contrast. COMPARISON:  Prior MRI from 09/03/2015. FINDINGS: Brain: Advanced cerebral atrophy with chronic microvascular ischemic disease. Encephalomalacia within the posterior right MCA distribution, consistent with remote infarct. Few scattered remote right cerebellar infarcts. No acute intracranial hemorrhage. No evidence for acute large vessel territory infarct. No mass lesion, midline shift or mass effect. No hydrocephalus. No extra-axial fluid collection. Vascular: No worrisome hyperdense vessel. Scattered intracranial atherosclerosis noted. Skull: Scalp soft tissues and calvarium within normal limits. Sinuses/Orbits: Globes normal soft tissues normal. Patient status post lens extraction bilaterally. Paranasal sinuses and mastoids are clear. Other: None. ASPECTS [facility] Stroke Program Early CT Score) - Ganglionic level infarction (caudate, lentiform nuclei, internal capsule, insula, M1-M3 cortex): 7 - Supraganglionic infarction (M4-M6 cortex): 3 Total score (0-10 with 10 being  normal): 10 IMPRESSION: 1. No acute intracranial infarct or other process identified. 2. ASPECTS is 10. 3. Remote right MCA and right cerebellar infarcts. 4. Advanced cerebral atrophy with chronic microvascular ischemic disease. Dr. Roseanne Reno of the Stroke Neurology service was paged regarding these results at 8 10 :p.m. on 05/02/2017. Results were subsequently communicated via secure text page to Dr.  Roseanne Reno at 8:16 p.m. on 05/02/2017. Electronically Signed   By: Rise Mu M.D.   On: 05/02/2017 20:17    Assessment: 83 y.o. female with a history of previous stroke and hypertension as well as hyperlipidemia presenting with probable TIA. A recurrent right cerebral infarction cannot be ruled out at this point, however.  Stroke Risk Factors - hyperlipidemia and hypertension  Plan: 1. HgbA1c, fasting lipid panel 2. MRI, MRA  of the brain without contrast 3. PT consult, OT consult, Speech consult 4. Echocardiogram 5. Carotid dopplers 6. Prophylactic therapy-Antiplatelet med: Plavix  7. Risk factor modification 8. Telemetry monitoring  C.R. Roseanne Reno, MD [facility] [phone] 05/02/2017, 8:22 PM

## 2017-05-02 NOTE — ED Provider Notes (Signed)
[facility]-EMERGENCY DEPT Provider Note   CSN: [id number] Arrival date & time: 05/02/17  1943     History   Chief Complaint Chief Complaint  Patient presents with  . Code Stroke    HPI Gabrial E Tanguma is a 83 y.o. female.  This is a 83 year old female with PMH of prior CVA, HTN, hypothyroidism, CAD status post MI who presents via EMS for right-sided weakness concerning for acute CVA.  Patient apparently was eating dinner at home with her caretaker when caretaker noticed patient was dragging her right arm and could not pick up her glass of water.  She got up from dinner and also had difficulty walking.  Last known normal was at dinner at 7:45.  Patient had prior CVA with residual left-sided vision changes but otherwise normal strength in the left side.  Upon EMS arrival patient had resolved right-sided weakness and sensation.    The history is provided by the patient, the EMS personnel, a relative and a caregiver.    Past Medical History:  Diagnosis Date  . Acute myocardial infarction of other anterior wall, episode of care unspecified   . Arthritis   . Cancer (HCC)    hx of breast lumpectomy   . Coronary artery disease   . Cramp of limb   . Other and unspecified hyperlipidemia   . Other specified personal history presenting hazards to health(V15.89)   . Personal history of arthritis   . Personal history of malignant neoplasm of breast   . Personal history of other diseases of digestive system   . Stroke [facility]) 03/2015   "left sided issues"  . Unspecified essential hypertension   . Unspecified hypothyroidism   . Unspecified personal history presenting hazards to health     Patient Active Problem List   Diagnosis Date Noted  . History of stroke 03/07/2017  . Gait abnormality 03/07/2017  . Subacromial impingement of left shoulder 02/09/2016  . Hemiparesis affecting left side as late effect of stroke (HCC) 01/20/2016  . Vertigo 09/03/2015  . Dehydration 09/03/2015  . UTI (lower  urinary tract infection) 09/03/2015  . Prolonged Q-T interval on ECG 09/03/2015  . Left-sided neglect 07/01/2015  . History of right MCA stroke 07/01/2015  . Altered sensation due to stroke 04/29/2015  . Cognitive deficit, post-stroke 04/29/2015  . Pseudogout of left wrist 03/28/2015  . Hyperlipidemia LDL goal <70 03/26/2015  . Hyperglycemia 03/26/2015  . 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 Embolic stroke (HCC)   . Left hemiplegia (HCC)   . Homonymous hemianopia   . Chest wall trauma 01/11/2014  . Injury of left lower arm 01/11/2014  . Headache(784.0) 01/04/2013  . Allergic rhinitis, cause unspecified 01/04/2013  . Anemia 04/19/2012  . Epistaxis, recurrent 04/19/2012  . Hypothyroidism 01/01/2008  . Essential hypertension 01/01/2008  . HYPERTENSION 01/01/2008  . MYOCARDIAL INFARCTION, ACUTE, ANTERIOR WALL 01/01/2008  . LEG CRAMPS 01/01/2008  . ADENOCARCINOMA, BREAST, HX OF 01/01/2008  . DIVERTICULOSIS, COLON, HX OF 01/01/2008  . ARTHRITIS, HX OF 01/01/2008  . RADIATION THERAPY, HX OF 01/01/2008  . CATARACT EXTRACTION, HX OF 01/01/2008  . CHOLECYSTECTOMY, LAPAROSCOPIC, HX OF 01/01/2008    Past Surgical History:  Procedure Laterality Date  . BACK SURGERY    . CATARACT EXTRACTION     left-2006, right 2011  . CHOLECYSTECTOMY    . intrathoracic goiter resection    . lumpectomy for breast cancer Left 80's   radiation therapy  . right thyroid lobectomy  OB History    No data available       Home Medications    Prior to Admission medications   Medication Sig Start Date End Date Taking? Authorizing Provider  acetaminophen (TYLENOL) 325 MG tablet Take 2 tablets (650 mg total) by mouth every 4 (four) hours as needed for mild pain. 04/07/15   Love, Evlyn Kanner, PA-C  ALPRAZolam (XANAX) 0.25 MG tablet TAKE 1 TABLET BY MOUTH AT NIGHT AS NEEDED FOR ANXIETY/SLEEP 08/31/16   [provider]  amLODipine (NORVASC) 5 MG tablet Take 5  mg by mouth daily. 08/23/15   [provider]  atorvastatin (LIPITOR) 10 MG tablet Take 10 mg by mouth daily.    [provider]  Cholecalciferol (VITAMIN D3) 1000 units CAPS Take 5,000 Units by mouth daily.     [provider]  clopidogrel (PLAVIX) 75 MG tablet Take 1 tablet (75 mg total) by mouth daily. 04/07/15   Love, Evlyn Kanner, PA-C  CRANBERRY-VITAMIN C PO Take 4,200 mg by mouth daily.     [provider]  ferrous sulfate 325 (65 FE) MG tablet Take 325 mg by mouth daily. 07/07/16   [provider]  levothyroxine (SYNTHROID, LEVOTHROID) 25 MCG tablet Take 1 tablet by mouth  daily 12/25/14   Plotnikov, Georgina Quint, MD  Melatonin 10 MG TABS Take 1 tablet by mouth at bedtime.    [provider]  metoprolol (LOPRESSOR) 50 MG tablet Take 50 mg by mouth 2 (two) times daily. 08/30/15   [provider]  vitamin B-12 (CYANOCOBALAMIN) 1000 MCG tablet Take 1,000 mcg by mouth daily.     [provider]    Family History Family History  Problem Relation Age of Onset  . Colon cancer Mother   . Breast cancer Mother   . Coronary artery disease Brother   . Dementia Sister     Social History Social History  Substance Use Topics  . Smoking status: Never Smoker  . Smokeless tobacco: Never Used  . Alcohol use No     Allergies   Patient has no known allergies.   Review of Systems Review of Systems  Unable to perform ROS: Acuity of condition     Physical Exam Updated Vital Signs BP (!) 108/56   Pulse 62   Temp 98.2 F (36.8 C)   Resp (!) 21   Wt 45.3 kg (99 lb 13.9 oz)   SpO2 95%   BMI 20.17 kg/m   Physical Exam  Constitutional: She appears well-developed and well-nourished. No distress.  HENT:  Head: Normocephalic and atraumatic.  Eyes: Pupils are equal, round, and reactive to light. Conjunctivae are normal.  Neck: Neck supple.  Cardiovascular: Normal rate and regular rhythm.   No murmur heard. Pulmonary/Chest:  Effort normal and breath sounds normal. No respiratory distress.  Abdominal: Soft. There is no tenderness.  Musculoskeletal: Normal range of motion. She exhibits no edema.  Neurological: She is alert. She has normal strength. She displays no tremor. No cranial nerve deficit or sensory deficit.  Skin: Skin is warm and dry.  Psychiatric: She has a normal mood and affect.  Nursing note and vitals reviewed.    ED Treatments / Results  Labs (all labs ordered are listed, but only abnormal results are displayed) Labs Reviewed  COMPREHENSIVE METABOLIC PANEL - Abnormal; Notable for the following:       Result Value   Glucose, Bld 173 (*)    All other components within normal limits  RAPID URINE DRUG  SCREEN, HOSP PERFORMED - Abnormal; Notable for the following:    Benzodiazepines POSITIVE (*)    All other components within normal limits  URINALYSIS, ROUTINE W REFLEX MICROSCOPIC - Abnormal; Notable for the following:    APPearance HAZY (*)    Hgb urine dipstick SMALL (*)    Leukocytes, UA LARGE (*)    Bacteria, UA MANY (*)    Squamous Epithelial / LPF 0-5 (*)    All other components within normal limits  I-STAT CHEM 8, ED - Abnormal; Notable for the following:    Glucose, Bld 172 (*)    Calcium, Ion 1.13 (*)    All other components within normal limits  CBG MONITORING, ED - Abnormal; Notable for the following:    Glucose-Capillary 177 (*)    All other components within normal limits  PROTIME-INR  APTT  CBC  DIFFERENTIAL  ETHANOL  I-STAT TROPONIN, ED    EKG  EKG Interpretation None       Radiology Ct Head Code Stroke Wo Contrast  Result Date: 05/02/2017 CLINICAL DATA:  Code stroke. Initial evaluation for acute right-sided weakness. EXAM: CT HEAD WITHOUT CONTRAST TECHNIQUE: Contiguous axial images were obtained from the base of the skull through the vertex without intravenous contrast. COMPARISON:  Prior MRI from 09/03/2015. FINDINGS: Brain: Advanced cerebral atrophy with  chronic microvascular ischemic disease. Encephalomalacia within the posterior right MCA distribution, consistent with remote infarct. Few scattered remote right cerebellar infarcts. No acute intracranial hemorrhage. No evidence for acute large vessel territory infarct. No mass lesion, midline shift or mass effect. No hydrocephalus. No extra-axial fluid collection. Vascular: No worrisome hyperdense vessel. Scattered intracranial atherosclerosis noted. Skull: Scalp soft tissues and calvarium within normal limits. Sinuses/Orbits: Globes normal soft tissues normal. Patient status post lens extraction bilaterally. Paranasal sinuses and mastoids are clear. Other: None. ASPECTS [facility] Stroke Program Early CT Score) - Ganglionic level infarction (caudate, lentiform nuclei, internal capsule, insula, M1-M3 cortex): 7 - Supraganglionic infarction (M4-M6 cortex): 3 Total score (0-10 with 10 being normal): 10 IMPRESSION: 1. No acute intracranial infarct or other process identified. 2. ASPECTS is 10. 3. Remote right MCA and right cerebellar infarcts. 4. Advanced cerebral atrophy with chronic microvascular ischemic disease. Dr. Roseanne Reno of the Stroke Neurology service was paged regarding these results at 8 10 :p.m. on 05/02/2017. Results were subsequently communicated via secure text page to Dr. Roseanne Reno at 8:16 p.m. on 05/02/2017. Electronically Signed   By: Rise Mu M.D.   On: 05/02/2017 20:17    Procedures Procedures (including critical care time)  Medications Ordered in ED Medications - No data to display   Initial Impression / Assessment and Plan / ED Course  I have reviewed the triage vital signs and the nursing notes.  Pertinent labs & imaging results that were available during my care of the patient were reviewed by me and considered in my medical decision making (see chart for details).     This is a 83 year old female with PMH of prior CVA, HTN, hypothyroidism, CAD status post MI who presents  via EMS for right-sided weakness concerning for acute CVA. Glucose on arrival 177.  Code Stroke cancelled due to patient having resolution of symptoms on arrival with no gross right sided weakness or drift.   CT head negative for acute intracranial infarction or bleeding. Chronic microvesicular disease present.   Neurologist present at bedside who agreed on admission for TIA workup.  Family and caretaker updated at bedside. All questions answered.  Final Clinical Impressions(s) / ED Diagnoses  Final diagnoses:  Hemispheric carotid artery syndrome    New Prescriptions New Prescriptions   No medications on file     Shaune Pollack, MD 05/02/17 2311    Mesner, Barbara Cower, MD 05/03/17 1440

## 2017-05-03 ENCOUNTER — Observation Stay (HOSPITAL_COMMUNITY): Payer: Medicare Other

## 2017-05-03 ENCOUNTER — Encounter (HOSPITAL_COMMUNITY): Payer: Self-pay | Admitting: *Deleted

## 2017-05-03 ENCOUNTER — Observation Stay (HOSPITAL_BASED_OUTPATIENT_CLINIC_OR_DEPARTMENT_OTHER): Payer: Medicare Other

## 2017-05-03 DIAGNOSIS — I252 Old myocardial infarction: Secondary | ICD-10-CM | POA: Diagnosis not present

## 2017-05-03 DIAGNOSIS — H5340 Unspecified visual field defects: Secondary | ICD-10-CM | POA: Diagnosis present

## 2017-05-03 DIAGNOSIS — Z803 Family history of malignant neoplasm of breast: Secondary | ICD-10-CM | POA: Diagnosis not present

## 2017-05-03 DIAGNOSIS — N390 Urinary tract infection, site not specified: Secondary | ICD-10-CM | POA: Diagnosis not present

## 2017-05-03 DIAGNOSIS — Z8673 Personal history of transient ischemic attack (TIA), and cerebral infarction without residual deficits: Secondary | ICD-10-CM | POA: Diagnosis not present

## 2017-05-03 DIAGNOSIS — E785 Hyperlipidemia, unspecified: Secondary | ICD-10-CM | POA: Diagnosis present

## 2017-05-03 DIAGNOSIS — B961 Klebsiella pneumoniae [K. pneumoniae] as the cause of diseases classified elsewhere: Secondary | ICD-10-CM | POA: Diagnosis present

## 2017-05-03 DIAGNOSIS — I2510 Atherosclerotic heart disease of native coronary artery without angina pectoris: Secondary | ICD-10-CM | POA: Diagnosis present

## 2017-05-03 DIAGNOSIS — E039 Hypothyroidism, unspecified: Secondary | ICD-10-CM | POA: Diagnosis present

## 2017-05-03 DIAGNOSIS — G458 Other transient cerebral ischemic attacks and related syndromes: Secondary | ICD-10-CM

## 2017-05-03 DIAGNOSIS — G459 Transient cerebral ischemic attack, unspecified: Secondary | ICD-10-CM | POA: Diagnosis not present

## 2017-05-03 DIAGNOSIS — I69351 Hemiplegia and hemiparesis following cerebral infarction affecting right dominant side: Secondary | ICD-10-CM | POA: Diagnosis not present

## 2017-05-03 DIAGNOSIS — F0390 Unspecified dementia without behavioral disturbance: Secondary | ICD-10-CM | POA: Diagnosis present

## 2017-05-03 DIAGNOSIS — M1990 Unspecified osteoarthritis, unspecified site: Secondary | ICD-10-CM | POA: Diagnosis present

## 2017-05-03 DIAGNOSIS — R4781 Slurred speech: Secondary | ICD-10-CM | POA: Diagnosis present

## 2017-05-03 DIAGNOSIS — Z853 Personal history of malignant neoplasm of breast: Secondary | ICD-10-CM | POA: Diagnosis not present

## 2017-05-03 DIAGNOSIS — H53469 Homonymous bilateral field defects, unspecified side: Secondary | ICD-10-CM | POA: Diagnosis present

## 2017-05-03 DIAGNOSIS — H53462 Homonymous bilateral field defects, left side: Secondary | ICD-10-CM

## 2017-05-03 DIAGNOSIS — I340 Nonrheumatic mitral (valve) insufficiency: Secondary | ICD-10-CM

## 2017-05-03 DIAGNOSIS — Z79899 Other long term (current) drug therapy: Secondary | ICD-10-CM | POA: Diagnosis not present

## 2017-05-03 DIAGNOSIS — R531 Weakness: Secondary | ICD-10-CM | POA: Diagnosis not present

## 2017-05-03 DIAGNOSIS — Z800 Family history of malignant neoplasm of digestive organs: Secondary | ICD-10-CM | POA: Diagnosis not present

## 2017-05-03 DIAGNOSIS — Z7902 Long term (current) use of antithrombotics/antiplatelets: Secondary | ICD-10-CM | POA: Diagnosis not present

## 2017-05-03 DIAGNOSIS — Z66 Do not resuscitate: Secondary | ICD-10-CM | POA: Diagnosis present

## 2017-05-03 DIAGNOSIS — I10 Essential (primary) hypertension: Secondary | ICD-10-CM | POA: Diagnosis not present

## 2017-05-03 DIAGNOSIS — R410 Disorientation, unspecified: Secondary | ICD-10-CM | POA: Diagnosis not present

## 2017-05-03 DIAGNOSIS — R1110 Vomiting, unspecified: Secondary | ICD-10-CM | POA: Diagnosis not present

## 2017-05-03 DIAGNOSIS — Z8249 Family history of ischemic heart disease and other diseases of the circulatory system: Secondary | ICD-10-CM | POA: Diagnosis not present

## 2017-05-03 DIAGNOSIS — G8191 Hemiplegia, unspecified affecting right dominant side: Secondary | ICD-10-CM | POA: Diagnosis not present

## 2017-05-03 DIAGNOSIS — D509 Iron deficiency anemia, unspecified: Secondary | ICD-10-CM | POA: Diagnosis present

## 2017-05-03 DIAGNOSIS — Z23 Encounter for immunization: Secondary | ICD-10-CM | POA: Diagnosis not present

## 2017-05-03 LAB — ECHOCARDIOGRAM COMPLETE
Ao-asc: 32 cm
CHL CUP TV REG PEAK VELOCITY: 281 cm/s
E decel time: 211 msec
EERAT: 13.98
FS: 26 % — AB (ref 28–44)
HEIGHTINCHES: 63 in
IV/PV OW: 1.08
LA diam end sys: 43 mm
LA diam index: 3.05 cm/m2
LA vol A4C: 81.6 ml
LASIZE: 43 mm
LV PW d: 11.5 mm — AB (ref 0.6–1.1)
LV TDI E'MEDIAL: 3.56
LV e' LATERAL: 6.25 cm/s
LVEEAVG: 13.98
LVEEMED: 13.98
LVOT area: 1.77 cm2
LVOTD: 15 mm
Lateral S' vel: 8.92 cm/s
MV Dec: 211
MV Peak grad: 3 mmHg
MV pk A vel: 71.6 m/s
MV pk E vel: 87.4 m/s
PISA EROA: 0.06 cm2
RV TAPSE: 19.1 mm
TDI e' lateral: 6.25
TRMAXVEL: 281 cm/s
VTI: 216 cm
WEIGHTICAEL: 1597.89 [oz_av]

## 2017-05-03 LAB — VAS US CAROTID
LCCADDIAS: 11 cm/s
LEFT ECA DIAS: -2 cm/s
LEFT VERTEBRAL DIAS: -10 cm/s
Left CCA dist sys: 53 cm/s
Left CCA prox dias: -9 cm/s
Left CCA prox sys: -59 cm/s
Left ICA dist dias: -18 cm/s
Left ICA dist sys: -60 cm/s
Left ICA prox dias: -11 cm/s
Left ICA prox sys: -54 cm/s
RCCADSYS: -54 cm/s
RCCAPDIAS: -10 cm/s
RIGHT ECA DIAS: -3 cm/s
RIGHT VERTEBRAL DIAS: -9 cm/s
Right CCA prox sys: -96 cm/s

## 2017-05-03 LAB — LIPID PANEL
CHOL/HDL RATIO: 3.2 ratio
CHOLESTEROL: 129 mg/dL (ref 0–200)
HDL: 40 mg/dL — AB (ref >40)
LDL Cholesterol: 77 mg/dL (ref 0–99)
Triglycerides: 59 mg/dL (ref <150)
VLDL: 12 mg/dL (ref 0–40)

## 2017-05-03 LAB — HEMOGLOBIN A1C
Hgb A1c MFr Bld: 5 % (ref 4.8–5.6)
MEAN PLASMA GLUCOSE: 96.8 mg/dL

## 2017-05-03 MED ORDER — ACETAMINOPHEN 160 MG/5ML PO SOLN: 650.0000 mg | ORAL | Status: DC | PRN

## 2017-05-03 MED ORDER — ACETAMINOPHEN 325 MG PO TABS: 650.0000 mg | ORAL_TABLET | ORAL | Status: DC | PRN

## 2017-05-03 MED ORDER — FERROUS SULFATE 325 (65 FE) MG PO TABS
325.0000 mg | ORAL_TABLET | Freq: Every day | ORAL | Status: DC
  Administered 2017-05-03 – 2017-05-04 (×2): 325 mg via ORAL
  Filled 2017-05-03 (×2): qty 1

## 2017-05-03 MED ORDER — INFLUENZA VAC SPLIT HIGH-DOSE 0.5 ML IM SUSY
0.5000 mL | PREFILLED_SYRINGE | INTRAMUSCULAR | Status: AC
  Administered 2017-05-04: 0.5 mL via INTRAMUSCULAR
  Filled 2017-05-03: qty 0.5

## 2017-05-03 MED ORDER — ENOXAPARIN SODIUM 30 MG/0.3ML ~~LOC~~ SOLN
30.0000 mg | Freq: Every day | SUBCUTANEOUS | Status: DC
  Administered 2017-05-03 – 2017-05-05 (×3): 30 mg via SUBCUTANEOUS
  Filled 2017-05-03 (×3): qty 0.3

## 2017-05-03 MED ORDER — VITAMIN D 1000 UNITS PO TABS
5000.0000 [IU] | ORAL_TABLET | Freq: Every day | ORAL | Status: DC
  Administered 2017-05-03 – 2017-05-05 (×3): 5000 [IU] via ORAL
  Filled 2017-05-03 (×3): qty 5

## 2017-05-03 MED ORDER — ACETAMINOPHEN 650 MG RE SUPP: 650.0000 mg | RECTAL | Status: DC | PRN

## 2017-05-03 MED ORDER — CLOPIDOGREL BISULFATE 75 MG PO TABS
75.0000 mg | ORAL_TABLET | Freq: Every day | ORAL | Status: DC
  Administered 2017-05-03 – 2017-05-05 (×3): 75 mg via ORAL
  Filled 2017-05-03 (×3): qty 1

## 2017-05-03 MED ORDER — METOPROLOL TARTRATE 50 MG PO TABS
50.0000 mg | ORAL_TABLET | Freq: Two times a day (BID) | ORAL | Status: DC
Start: 2017-05-03 — End: 2017-05-05
  Administered 2017-05-03 – 2017-05-05 (×4): 50 mg via ORAL
  Filled 2017-05-03 (×5): qty 1

## 2017-05-03 MED ORDER — SENNOSIDES-DOCUSATE SODIUM 8.6-50 MG PO TABS: 1.0000 | ORAL_TABLET | Freq: Every evening | ORAL | Status: DC | PRN

## 2017-05-03 MED ORDER — ATORVASTATIN CALCIUM 10 MG PO TABS
10.0000 mg | ORAL_TABLET | Freq: Every day | ORAL | Status: DC
  Administered 2017-05-03 – 2017-05-05 (×3): 10 mg via ORAL
  Filled 2017-05-03 (×3): qty 1

## 2017-05-03 MED ORDER — LEVOTHYROXINE SODIUM 25 MCG PO TABS
25.0000 ug | ORAL_TABLET | Freq: Every day | ORAL | Status: DC
  Administered 2017-05-03 – 2017-05-04 (×2): 25 ug via ORAL
  Filled 2017-05-03 (×2): qty 1

## 2017-05-03 MED ORDER — VITAMIN B-12 1000 MCG PO TABS
1000.0000 ug | ORAL_TABLET | Freq: Every day | ORAL | Status: DC
  Administered 2017-05-03 – 2017-05-05 (×3): 1000 ug via ORAL
  Filled 2017-05-03 (×3): qty 1

## 2017-05-03 MED ORDER — ALPRAZOLAM 0.25 MG PO TABS: 0.2500 mg | ORAL_TABLET | Freq: Every evening | ORAL | Status: DC | PRN

## 2017-05-03 MED ORDER — STROKE: EARLY STAGES OF RECOVERY BOOK
Freq: Once | Status: AC
  Administered 2017-05-03: 1
  Filled 2017-05-03 (×2): qty 1

## 2017-05-03 NOTE — Progress Notes (Signed)
STROKE TEAM PROGRESS NOTE   HISTORY OF PRESENT ILLNESS (per record)  Leah Figueroa is an 83 y.o. female with a history of right parietal stroke, hypertension, hypothyroidism, breast cancer and arthritis, brought to the ED in code stroke status following acute onset of right side weakness and slurred speech. She was last known well at 5:45 PM. She's been taking Plavix daily. Deficits subsequently resolved. She has known visual problems and sensory changes on the left side since her previous stroke 2 years ago. CT scan of her head showed no acute abnormalities. NIH stroke score was 4  LSN: [id number] on 05/02/2017 tPA Given: No: acute deficits resolved  Patient was not administered IV t-PA secondary to minimal symptoms. She was admitted to General Neurology for further evaluation and treatment.   SUBJECTIVE (INTERVAL HISTORY) No family at the bedside is at the bedside.  Pt is awake, alert, and follows all commands appropriately.  The pt lives at home and her daughter is her caregiver.  States she does not think she has had a stroke but her daughter brought her here.   OBJECTIVE Temp:  [98.2 F (36.8 C)] 98.2 F (36.8 C) (09/18 1450) Pulse Rate:  [54-79] 71 (09/18 1450) Cardiac Rhythm: Normal sinus rhythm (09/18 0700) Resp:  [13-23] 16 (09/18 1450) BP: (101-135)/(45-90) 133/61 (09/18 1450) SpO2:  [93 %-100 %] 100 % (09/18 1450) Weight:  [45.3 kg (99 lb 13.9 oz)] 45.3 kg (99 lb 13.9 oz) (09/18 0815)  CBC:   Recent Labs Lab 05/02/17 1954 05/02/17 2005  WBC 5.7  --   NEUTROABS 3.3  --   HGB 12.8 12.6  HCT 39.7 37.0  MCV 95.9  --   PLT 211  --     Basic Metabolic Panel:   Recent Labs Lab 05/02/17 1954 05/02/17 2005  NA 137 142  K 3.9 3.9  CL 106 104  CO2 25  --   GLUCOSE 173* 172*  BUN 13 15  CREATININE 0.76 0.80  CALCIUM 9.1  --     Lipid Panel:     Component Value Date/Time   CHOL 129 05/03/2017 0624   TRIG 59 05/03/2017 0624   HDL 40 (L) 05/03/2017 0624   CHOLHDL  3.2 05/03/2017 0624   VLDL 12 05/03/2017 0624   LDLCALC 77 05/03/2017 0624   HgbA1c:  Lab Results  Component Value Date   HGBA1C 5.0 05/03/2017   Urine Drug Screen:     Component Value Date/Time   LABOPIA NONE DETECTED 05/02/2017 2042   COCAINSCRNUR NONE DETECTED 05/02/2017 2042   LABBENZ POSITIVE (A) 05/02/2017 2042   AMPHETMU NONE DETECTED 05/02/2017 2042   THCU NONE DETECTED 05/02/2017 2042   LABBARB NONE DETECTED 05/02/2017 2042    Alcohol Level     Component Value Date/Time   [facility] <5 03/23/2015 0019    IMAGING  Mr Brain Wo Contrast Mr Maxine Glenn Head Wo Contrast 05/03/2017 IMPRESSION: 1. No acute intracranial abnormality. 2. Multiple old infarcts and advanced white matter disease consistent with chronic hypertensive microangiopathy. 3. Numerous chronic micro hemorrhages, predominantly located in the cerebellum. The distribution is most consistent with chronic hypertension. 4. No emergent large vessel occlusion. The symmetric, diminished enhancement within the inferior courses of both of the middle and posterior cerebral arteries is suspected to be artifactual. No other stenosis.   Ct Head Code Stroke Wo Contrast 05/03/2017 IMPRESSION: 1. No acute intracranial infarct or other process identified. 2. ASPECTS is 10. 3. Remote right MCA and right cerebellar infarcts. 4. Advanced  cerebral atrophy with chronic microvascular ischemic disease.   Carotid US [id number] Summary: Findings are consistent with a 1-39 percent stenosis involving the right internal carotid artery and the left internal carotid artery. The vertebral arteries demonstrate antegrade flow.  TTE 05/03/2017 Study Conclusions - Left ventricle: The cavity size was normal. Systolic function was   normal. The estimated ejection fraction was in the range of 55%   to 60%. Wall motion was normal; there were no regional wall   motion abnormalities. Features are consistent with a pseudonormal   left ventricular filling  pattern, with concomitant abnormal   relaxation and increased filling pressure (grade 2 diastolic   dysfunction). - Mitral valve: Calcified annulus. There was mild regurgitation. - Left atrium: The atrium was mildly dilated. - Pulmonary arteries: Systolic pressure was mildly increased. PA   peak pressure: 35 mm Hg (S).     PHYSICAL EXAM Pleasant frail elderly malnourished looking lady not in any distress. . Afebrile. Head is nontraumatic. Neck is supple without bruit.    Cardiac exam no murmur or gallop. Lungs are clear to auscultation. Distal pulses are well felt. Neurological Exam ;  Awake  Alert oriented x 3. Normal speech and language.diminished attention, registration and recall. Follows commands well. eye movements full without nystagmus.fundi were not visualized. Vision acuity and fields appear normal. Hearing is normal. Palatal movements are normal. Face symmetric. Tongue midline. Normal strength, tone, reflexes and coordination. Normal sensation. Gait deferred.  ASSESSMENT/PLAN Ms. Leah Figueroa is a 83 y.o. female with history of right parietal stroke, hypertension, hypothyroidism, breast cancer and arthritis, brought to the ED in code stroke status following acute onset of right side weakness and slurred speech. She did not receive IV t-PA due to presenting with minimal symptoms.   Left brain TIA: No acute stroke identified on imaging, symptoms likely secondary to UTI, in setting of UTI  Resultant  no deficits  CT head: no acute stroke  MRI head: no acute stroke.  Multiple chronic microhemorrhages, likely from uncontrolled hypertension  MRA head: no LVO or high-grade stenosis  Carotid Doppler: B ICA 1-39% stenosis, VAs antegrade  2D Echo: EF 55-60%. No source of embolus   LDL 77  HgbA1c 5.0  Lovenox 40 mg sq daily for VTE prophylaxis Diet Heart Room service appropriate? Yes; Fluid consistency: Thin  clopidogrel 75 mg daily prior to admission, now on clopidogrel 75  mg daily  Patient counseled to be compliant with her antithrombotic medications  Ongoing aggressive stroke risk factor management  Therapy recommendations: pending  Disposition: pending  Hyperlipidemia  Home meds:  Atorvastatin 10 mg PO daily, resumed in hospital  LDL 77, goal < 70  Continue statin at discharge  Other Stroke Risk Factors  Advanced age  Hx stroke/TIA  Other Active Problems  UTI: on IV rocephin  Hospital day # 0  I have personally examined this patient, reviewed notes, independently viewed imaging studies, participated in medical decision making and plan of care.ROS completed by me personally and pertinent positives fully documented  I have made any additions or clarifications directly to the above note. She has presented with transient right-sided weakness possibly a TIA in the setting of likely urinary tract infection. Continue treatment of infection in hydration. Continue Plavix. No further stroke workup is necessary. No family available at the bedside for discussion. Greater than 50% time during this 25 minute visit was spent on counseling and coordination of care about her TIA and stroke evaluation and treatment. Stroke team will sign  off. Follow-up as an outpatient in stroke clinic.  Delia Heady, MD Medical Director [facility] Pager: [phone] 05/03/2017 4:55 PM   To contact Stroke Continuity provider, please refer to [email or web address]. After hours, contact General Neurology

## 2017-05-03 NOTE — Progress Notes (Signed)
**  Preliminary report by tech**  Carotid artery duplex complete. Findings are consistent with a 1-39 percent stenosis involving the right internal carotid artery and the left internal carotid artery. The vertebral arteries demonstrate antegrade flow.  05/03/17 2:13 PM Olen Cordial RVT

## 2017-05-03 NOTE — Evaluation (Signed)
Physical Therapy Evaluation Patient Details Name: Leah Figueroa MRN: [id number] DOB: 11-07-1922 Today's Date: 05/03/2017   History of Present Illness  pt is a 83 y/o femal with pmh significant for prior R sided CVA with l homonymous hemianopsia, HTN, CAD, presenting to ED with R sided weakness.  Work up found UTI,  MRI showed no new acute abnormalities.  Clinical Impression  Pt admitted with/for s/s of new stroke that resolved by arrival.  Pt is at a min guard to supervision level and likely close to baseline function given past stroke with residuals including L homo hemianopsia..  Pt currently limited functionally due to the problems listed below.  (see problems list.)  Pt will benefit from PT to maximize function and safety to be able to get home safely with available assist.     Follow Up Recommendations No PT follow up    Equipment Recommendations  None recommended by PT    Recommendations for Other Services       Precautions / Restrictions Precautions Precautions: Fall      Mobility  Bed Mobility Overal bed mobility: Needs Assistance Bed Mobility: Rolling;Sidelying to Sit;Sit to Supine Rolling: Modified independent (Device/Increase time) Sidelying to sit: Supervision   Sit to supine: Supervision      Transfers Overall transfer level: Needs assistance   Transfers: Sit to/from Stand Sit to Stand: Supervision            Ambulation/Gait Ambulation/Gait assistance: Supervision Ambulation Distance (Feet): 160 Feet Assistive device: Rolling walker (2 wheeled) Gait Pattern/deviations: Step-through pattern   Gait velocity interpretation: Below normal speed for age/gender General Gait Details: Overall steady with episodes of mild unsteadiness when pt is distracted.  Occasionally left her RW at a location away from the present task.  Again I'm hopeful that this was distractaility in this situation.  Stairs            Wheelchair Mobility    Modified Rankin  (Stroke Patients Only)       Balance Overall balance assessment: Needs assistance   Sitting balance-[name] Scale: Good       Standing balance-[name] Scale: Fair                               Pertinent Vitals/Pain Pain Assessment: Faces Pain Score: 0-No pain Faces Pain Scale: No hurt    Home Living Family/patient expects to be discharged to:: Private residence Living Arrangements: Children Available Help at Discharge: Family Type of Home: House Home Access: Stairs to enter Entrance Stairs-Rails: Doctor, general practice of Steps: several Home Layout: One level Home Equipment: Environmental consultant - 4 wheels;Grab bars - tub/shower      Prior Function Level of Independence: Needs assistance   Gait / Transfers Assistance Needed: walks with rollator, sounds as if she is supervised.  ADL's / Homemaking Assistance Needed: pt is assisted with ADL's        Hand Dominance   Dominant Hand: Right    Extremity/Trunk Assessment   Upper Extremity Assessment Upper Extremity Assessment: Defer to OT evaluation    Lower Extremity Assessment Lower Extremity Assessment: Overall WFL for tasks assessed (mild proximal weakness)       Communication   Communication: No difficulties  Cognition Arousal/Alertness: Awake/alert Behavior During Therapy: WFL for tasks assessed/performed Overall Cognitive Status: No family/caregiver present to determine baseline cognitive functioning (pt gets easily distracted, tangential)  General Comments      Exercises     Assessment/Plan    PT Assessment Patient needs continued PT services  PT Problem List Decreased strength;Decreased mobility;Decreased balance       PT Treatment Interventions DME instruction;Gait training;Stair training;Functional mobility training;Therapeutic activities;Balance training;Patient/family education    PT Goals (Current goals can be found in the  Care Plan section)  Acute Rehab PT Goals Patient Stated Goal: home as soon as I can PT Goal Formulation: With patient Time For Goal Achievement: 05/10/17 Potential to Achieve Goals: Good    Frequency Min 3X/week   Barriers to discharge        Co-evaluation PT/OT/SLP Co-Evaluation/Treatment: Yes Reason for Co-Treatment: To address functional/ADL transfers PT goals addressed during session: Mobility/safety with mobility         AM-PAC PT "6 Clicks" Daily Activity  Outcome Measure Difficulty turning over in bed (including adjusting bedclothes, sheets and blankets)?: A Little Difficulty moving from lying on back to sitting on the side of the bed? : A Little Difficulty sitting down on and standing up from a chair with arms (e.g., wheelchair, bedside commode, etc,.)?: A Little Help needed moving to and from a bed to chair (including a wheelchair)?: A Little Help needed walking in hospital room?: A Little Help needed climbing 3-5 steps with a railing? : A Little 6 Click Score: 18    End of Session   Activity Tolerance: Patient tolerated treatment well Patient left: in bed;with call bell/phone within reach;with bed alarm set Nurse Communication: Mobility status PT Visit Diagnosis: Unsteadiness on feet (R26.81);Muscle weakness (generalized) (M62.81)    Time: 1610-9604 PT Time Calculation (min) (ACUTE ONLY): 35 min   Charges:   PT Evaluation $PT Eval Moderate Complexity: 1 Mod     PT G Codes:   PT G-Codes **NOT FOR INPATIENT CLASS** Functional Assessment Tool Used: AM-PAC 6 Clicks Basic Mobility;Clinical judgement Functional Limitation: Mobility: Walking and moving around Mobility: Walking and Moving Around Current Status (V4098): At least 1 percent but less than 20 percent impaired, limited or restricted Mobility: Walking and Moving Around Goal Status [phone]): At least 1 percent but less than 20 percent impaired, limited or restricted    05/03/2017  Borger Bing,  PT [phone]  (pager)  Leah Figueroa 05/03/2017, 5:50 PM

## 2017-05-03 NOTE — Evaluation (Signed)
Occupational Therapy Evaluation Patient Details Name: Leah Figueroa MRN: [id number] DOB: [birth year] Today's Date: 05/03/2017    History of Present Illness Pt is a 83 y/o femal with pmh significant for prior R sided CVA with L homonymous hemianopsia, HTN, CAD, presenting to ED with R sided weakness.  Work up found UTI,  MRI showed no new acute abnormalities.   Clinical Impression   PTA, pt reports having assistance for ADL participation and supervision for functional mobility at all times. She currently requires min guard assist for toilet transfers, toileting hygiene, and standing grooming tasks at RW level. She additionally presents with decreased attention to task and is easily distracted impacting safety with ADL. She does have residual L visual field deficits from previous CVA. Per pt report, she is functioning close to baseline at this time. Feel she would benefit from continued OT services while admitted to maximize independence and safety prior to D/C home with 24 hour assistance. Do not anticipate need for OT follow-up post-acute D/C.    Follow Up Recommendations  No OT follow up;Supervision/Assistance - 24 hour    Equipment Recommendations  3 in 1 bedside commode;Tub/shower seat    Recommendations for Other Services       Precautions / Restrictions Precautions Precautions: Fall Restrictions Weight Bearing Restrictions: No      Mobility Bed Mobility Overal bed mobility: Needs Assistance Bed Mobility: Rolling;Sidelying to Sit;Sit to Supine Rolling: Modified independent (Device/Increase time) Sidelying to sit: Supervision   Sit to supine: Supervision   General bed mobility comments: Supervision for safety and VC's to position herself correctly.   Transfers Overall transfer level: Needs assistance   Transfers: Sit to/from Stand Sit to Stand: Supervision         General transfer comment: Supervision for sit<>stand. Min guard once dynamic tasks are involved.      Balance Overall balance assessment: Needs assistance Sitting-balance support: No upper extremity supported;Feet supported Sitting balance-[name] Scale: Good     Standing balance support: Bilateral upper extremity supported;No upper extremity supported;During functional activity Standing balance-[name] Scale: Fair Standing balance comment: Able to statically maintain standing balance without UE support but relies on B UE support for stability with dynamic standing.                            ADL either performed or assessed with clinical judgement   ADL Overall ADL's : Needs assistance/impaired Eating/Feeding: Set up;Sitting   Grooming: Min guard;Standing   Upper Body Bathing: Set up;Sitting   Lower Body Bathing: Min guard;Sit to/from stand   Upper Body Dressing : Set up;Sitting   Lower Body Dressing: Min guard;Sit to/from stand   Toilet Transfer: Min guard;Ambulation;RW Toilet Transfer Details (indicate cue type and reason): Cues for safe use of RW Toileting- Clothing Manipulation and Hygiene: Min guard;Sit to/from stand       Functional mobility during ADLs: Min guard;Rolling walker General ADL Comments: Pt often leaves her RW behind. 2 wheeled RW is different from pt's typical Rollator and may have caused difficulties with safety carryover.      Vision Baseline Vision/History:  (L homonymous hemianopsia) Patient Visual Report: No change from baseline Vision Assessment?: Yes Visual Fields: Left visual field deficit Additional Comments: Pt with L visual field deficits at baseline. Does demonstrate difficulty attending to L visual field at times. Lacking approximately 45 degrees of L visual field.      Perception     Praxis  Pertinent Vitals/Pain Pain Assessment: Faces Pain Score: 0-No pain Faces Pain Scale: No hurt     Hand Dominance Right   Extremity/Trunk Assessment Upper Extremity Assessment Upper Extremity Assessment: Overall WFL for tasks  assessed   Lower Extremity Assessment Lower Extremity Assessment: Defer to PT evaluation       Communication Communication Communication: No difficulties   Cognition Arousal/Alertness: Awake/alert Behavior During Therapy: WFL for tasks assessed/performed Overall Cognitive Status: No family/caregiver present to determine baseline cognitive functioning                                 General Comments: Pt tangential and easily distracted demonstrating sustained attention to task. Often answers different question than she was asked (example: when asked if she remembers her room number pt responds "Well it is blue.")   General Comments       Exercises     Shoulder Instructions      Home Living Family/patient expects to be discharged to:: Private residence Living Arrangements: Children Available Help at Discharge: Family Type of Home: House Home Access: Stairs to enter Secretary/administrator of Steps: several Entrance Stairs-Rails: Right;Left Home Layout: One level     Bathroom Shower/Tub: Engineer, production Accessibility: Yes   Home Equipment: Environmental consultant - 4 wheels;Grab bars - tub/shower      Lives With: Other (Comment);Daughter    Prior Functioning/Environment Level of Independence: Needs assistance  Gait / Transfers Assistance Needed: walks with rollator, sounds as if she is supervised. ADL's / Homemaking Assistance Needed: Pt takes baths in tub with assistance for transfer. Pt is assisted with ADL's from family and PCA.   Comments: Recently hired new caregiver to assist with baths. Has call buttons on her bed and always has assistance when she gets up per pt.         OT Problem List: Decreased activity tolerance;Impaired balance (sitting and/or standing);Impaired vision/perception;Decreased cognition;Decreased safety awareness;Decreased knowledge of use of DME or AE;Decreased knowledge of precautions      OT Treatment/Interventions:  [place]iza-care/ADL training;Therapeutic exercise;Therapeutic activities;Cognitive remediation/compensation;Visual/perceptual remediation/compensation;Patient/family education;Balance training;DME and/or AE instruction    OT Goals(Current goals can be found in the care plan section) Acute Rehab OT Goals Patient Stated Goal: home as soon as I can OT Goal Formulation: With patient Time For Goal Achievement: 05/10/17 Potential to Achieve Goals: Good ADL Goals Pt Will Perform Grooming: with supervision;standing Pt Will Perform Upper Body Dressing: with modified independence;sitting Pt Will Perform Lower Body Dressing: with supervision;sit to/from stand Pt Will Transfer to Toilet: with supervision;ambulating;bedside commode Pt Will Perform Toileting - Clothing Manipulation and hygiene: with supervision;sit to/from stand  OT Frequency: Min 2X/week   Barriers to D/C:            Co-evaluation PT/OT/SLP Co-Evaluation/Treatment: Yes Reason for Co-Treatment: To address functional/ADL transfers PT goals addressed during session: Mobility/safety with mobility OT goals addressed during session: ADL's and Begue-care      AM-PAC PT "6 Clicks" Daily Activity     Outcome Measure Help from another person eating meals?: A Little Help from another person taking care of personal grooming?: A Little Help from another person toileting, which includes using toliet, bedpan, or urinal?: A Little Help from another person bathing (including washing, rinsing, drying)?: A Little Help from another person to put on and taking off regular upper body clothing?: A Little Help from another person to put on and taking off regular lower body  clothing?: A Little 6 Click Score: 18   End of Session Equipment Utilized During Treatment: Rolling walker  Activity Tolerance: Patient tolerated treatment well Patient left: in bed;with call bell/phone within reach;with bed alarm set  OT Visit Diagnosis: Unsteadiness on feet  (R26.81);Other symptoms and signs involving cognitive function                Time: 1610-9604 OT Time Calculation (min): 35 min Charges:  OT General Charges $OT Visit: 1 Visit OT Evaluation $OT Eval Moderate Complexity: 1 Mod G-Codes:     Doristine Section, MS OTR/L  Pager: [phone]   Kieli Golladay A Chrysa Rampy 05/03/2017, 7:19 PM

## 2017-05-03 NOTE — Progress Notes (Signed)
PROGRESS NOTE  Subjective: Leah Figueroa is a 83 y.o. female with a history of right CVA, HTN, hypothyroidism, breast CA, arthritis and iron deficiency who presented to the ED 9/17 for acute onset right sided weakness and slurred speech, though these deficits resolved on arrival to the ED. She was brought in for observation for TIA work up early this morning by Dr. Julian Reil.  Objective: BP (!) 115/59   Pulse 60   Temp 98.2 F (36.8 C) (Oral)   Resp 18   Ht  (1.6 m)   Wt 45.3 kg (99 lb 13.9 oz)   SpO2 97%   BMI 17.69 kg/m   Gen: Well-appearing elderly female in no distress Pulm: Clear and nonlabored on room air  CV: RRR, no murmur, no JVD, no edema GI: Soft, NT, ND, +BS  Neuro: Alert. Speech normal. HOH and left visual field cut noted, otherwise CN's intact. Left arm/leg with intact but abnormal sensation. Moves all extremities.  Skin: No rashes, lesions no ulcers  Assessment & Plan: TIA: Right-sided weakness resolved, speech at baseline. Chronic left hemianopia and hemisensory changes from right MCA CVA, stable. MRI negative for acute infarct. Carotid duplex with insignificant stenosis. Echo without CES. HbA1c 5%.  - Consider augmenting lipitor  due to LDL 77, though HDL is 40. Defer to neurology.  - Antiplatelet per neurology. Previously on plavix. - Anticipate DC to home with supportive environment pending stroke team recommendations and therapy evaluations.  UTI: Possible provocative factor in resolved deficits.  - Will have urine culture added on.  - Continue antibiotics: CTX > keflex pending culture results.  Essential HTN: Chronic, stable - Continue metoprolol. - Holding norvasc.  Hypothyroidism: Chronic, stable.  - Continue synthroid.  Iron deficiency anemia: Hgb wnl.  - Continue po iron.   [name], MD [facility] Pager [phone] 05/03/2017, 2:36 PM

## 2017-05-03 NOTE — Progress Notes (Signed)
  Echocardiogram 2D Echocardiogram has been performed.  Leah Figueroa 05/03/2017, 12:13 PM

## 2017-05-03 NOTE — ED Notes (Signed)
Report called to [facility], however patient placement called and patient will have to be moved to 5W.  Will call report to 5W

## 2017-05-03 NOTE — Evaluation (Addendum)
Speech Language Pathology Evaluation Patient Details Name: Leah Figueroa MRN: [id number] DOB: Nov 23, 1922 Today's Date: 05/03/2017 Time: 0825-0900 SLP Time Calculation (min) (ACUTE ONLY): 35 min  Problem List:  Patient Active Problem List   Diagnosis Date Noted  . TIA (transient ischemic attack) 05/02/2017  . History of stroke 03/07/2017  . Gait abnormality 03/07/2017  . Subacromial impingement of left shoulder 02/09/2016  . Hemiparesis affecting left side as late effect of stroke (HCC) 01/20/2016  . Vertigo 09/03/2015  . Dehydration 09/03/2015  . Acute lower UTI 09/03/2015  . Prolonged Q-T interval on ECG 09/03/2015  . Left-sided neglect 07/01/2015  . History of right MCA stroke 07/01/2015  . Altered sensation due to stroke 04/29/2015  . Cognitive deficit, post-stroke 04/29/2015  . Pseudogout of left wrist 03/28/2015  . Hyperlipidemia LDL goal <70 03/26/2015  . Hyperglycemia 03/26/2015  . 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 Embolic stroke (HCC)   . Left hemiplegia (HCC)   . Homonymous hemianopia   . Chest wall trauma 01/11/2014  . Injury of left lower arm 01/11/2014  . Headache(784.0) 01/04/2013  . Allergic rhinitis, cause unspecified 01/04/2013  . Anemia 04/19/2012  . Epistaxis, recurrent 04/19/2012  . Hypothyroidism 01/01/2008  . Essential hypertension 01/01/2008  . HYPERTENSION 01/01/2008  . MYOCARDIAL INFARCTION, ACUTE, ANTERIOR WALL 01/01/2008  . LEG CRAMPS 01/01/2008  . ADENOCARCINOMA, BREAST, HX OF 01/01/2008  . DIVERTICULOSIS, COLON, HX OF 01/01/2008  . ARTHRITIS, HX OF 01/01/2008  . RADIATION THERAPY, HX OF 01/01/2008  . CATARACT EXTRACTION, HX OF 01/01/2008  . CHOLECYSTECTOMY, LAPAROSCOPIC, HX OF 01/01/2008   Past Medical History:  Past Medical History:  Diagnosis Date  . Acute myocardial infarction of other anterior wall, episode of care unspecified   . Arthritis   . Cancer (HCC)    hx of breast lumpectomy   .  Coronary artery disease   . Cramp of limb   . Other and unspecified hyperlipidemia   . Other specified personal history presenting hazards to health(V15.89)   . Personal history of arthritis   . Personal history of malignant neoplasm of breast   . Personal history of other diseases of digestive system   . Stroke [facility]) 03/2015   "left sided issues"  . Unspecified essential hypertension   . Unspecified hypothyroidism   . Unspecified personal history presenting hazards to health    Past Surgical History:  Past Surgical History:  Procedure Laterality Date  . BACK SURGERY    . CATARACT EXTRACTION     left-2006, right 2011  . CHOLECYSTECTOMY    . intrathoracic goiter resection    . lumpectomy for breast cancer Left 80's   radiation therapy  . right thyroid lobectomy     HPI:  HPI: Leah Figueroa is a 83 y.o. female with medical history significant of prior R sided CVA with L sided deficits, HTN, hypothyroidism, CAD.  Patient presents via EMS for R sided weakness noticed after dinner this evening.  LKW at 7:45 pm.  Patient has prior R CVA with residual L homonymous hemianopsia, normal strength on L side.  MRI negative.      Assessment / Plan / Recommendation Clinical Impression  Portions of MOCA adminstered *not written due to pt's mechanical writing deficits.   Suspect level of hearing loss impacted pt's scoring (although SLP made effort to assure pt could hear).  Areas of difficulties included attention.  She was oriented to [place], place, month and year as well as current events.  Pt scoring on MOCA 7.3 was 9/21 indicative of significant cognitive deficit. Pt does reports she lives with her daughter and has 24/7 supervision.   She state she "does nothing" at home regarding chores, etc and all her medications, etc are managed for her.  Her communication is functional = though she did demonstrate word finding deficit x2 during conversation.  She is also able to state need to use call bell for  safety to prevent fall risk.  No SlP follow up as MRI negative and pt functional for her supportive environment.   Reviewed findings with pt who is in agreement.  Advise to continue 24/7 supervision.     SLP Assessment  SLP Recommendation/Assessment: Patient does not need any further Speech Lanaguage Pathology Services    Follow Up Recommendations  None    Frequency and Duration           SLP Evaluation Cognition  Overall Cognitive Status: No family/caregiver present to determine baseline cognitive functioning Arousal/Alertness: Awake/alert Orientation Level: Oriented to person;Oriented to place;Disoriented to situation (oriented to month and year, not specific date) Attention: Sustained Sustained Attention: Impaired Sustained Attention Impairment: Verbal complex Memory: Impaired (suspect impaired at baseline) Memory Impairment: Retrieval deficit (pt able to retrieve two words with category cue and 3 wih choice of 3) Awareness: Appears intact Problem Solving: Appears intact (looking for call bell to locate nurse if needed, basic problem solving) Safety/Judgment: Appears intact       Comprehension  Auditory Comprehension Overall Auditory Comprehension: Appears within functional limits for tasks assessed Yes/No Questions: Not tested Commands: Within Functional Limits (for basic directions) Conversation: Simple Interfering Components: Hearing;Working Civil Service fast streamer (pt is very Designer, fashion/clothing) Counsellor: Not tested Reading Comprehension Reading Status: Within funtional limits (for basic sentence)    Expression Expression Primary Mode of Expression: Verbal Verbal Expression Overall Verbal Expression: Other (comment) Initiation: No impairment Level of Generative/Spontaneous Verbalization: Conversation Repetition: Impaired Level of Impairment: Sentence level (for detailed information, able to state basic idea) Naming: Impairment (correctly identified 2/3  animals - states this difficulty is baseline) Pragmatics: No impairment Written Expression Dominant Hand: Right Written Expression: Not tested (pt reports she can write legibly due to weakness)   Oral / Motor  Oral Motor/Sensory Function Overall Oral Motor/Sensory Function:  (? mild left facial asymmetry, otherwise unremarkable) Motor Speech Overall Motor Speech: Appears within functional limits for tasks assessed Respiration: Within functional limits Phonation: Other (comment);Hoarse (pt admits to mild hoarseness with this admit) Resonance: Within functional limits Articulation: Within functional limitis Intelligibility: Intelligible Motor Planning: Witnin functional limits Motor Speech Errors: Not applicable   GO          Functional Assessment Tool Used: MOCA clinical judgement Functional Limitations: Memory Memory Current Status (U9811): At least 20 percent but less than 40 percent impaired, limited or restricted Memory Goal Status (B1478): At least 20 percent but less than 40 percent impaired, limited or restricted Memory Discharge Status [id number]): At least 20 percent but less than 40 percent impaired, limited or restricted         Chales Abrahams. Donavan Burnet, MS [facility] SLP [phone]

## 2017-05-03 NOTE — H&P (Addendum)
History and Physical    [name]:[id number] DOB: 07-31-1923 DOA: 05/02/2017  PCP: Merlene Laughter, MD  Patient coming from: Home  I have personally briefly reviewed patient's old medical records in [facility]  Chief Complaint: R sided weakness  HPI: Leah Figueroa is a 83 y.o. female with medical history significant of prior R sided CVA with L sided deficits, HTN, hypothyroidism, CAD.  Patient presents via EMS for R sided weakness noticed after dinner this evening.  LKW at 7:45 pm.  Patient has prior R CVA with residual L homonymous hemianopsia, normal strength on L side.   ED Course: Symptoms resolved on EMS arrival.  Found to have UTI.   Review of Systems: As per HPI otherwise 10 point review of systems negative.   Past Medical History:  Diagnosis Date  . Acute myocardial infarction of other anterior wall, episode of care unspecified   . Arthritis   . Cancer (HCC)    hx of breast lumpectomy   . Coronary artery disease   . Cramp of limb   . Other and unspecified hyperlipidemia   . Other specified personal history presenting hazards to health(V15.89)   . Personal history of arthritis   . Personal history of malignant neoplasm of breast   . Personal history of other diseases of digestive system   . Stroke [facility]) 03/2015   "left sided issues"  . Unspecified essential hypertension   . Unspecified hypothyroidism   . Unspecified personal history presenting hazards to health     Past Surgical History:  Procedure Laterality Date  . BACK SURGERY    . CATARACT EXTRACTION     left-2006, right 2011  . CHOLECYSTECTOMY    . intrathoracic goiter resection    . lumpectomy for breast cancer Left 80's   radiation therapy  . right thyroid lobectomy       reports that she has never smoked. She has never used smokeless tobacco. She reports that she does not drink alcohol or use drugs.  No Known Allergies  Family History  Problem Relation Age of Onset  . Colon cancer  Mother   . Breast cancer Mother   . Coronary artery disease Brother   . Dementia Sister      Prior to Admission medications   Medication Sig Start Date End Date Taking? Authorizing Provider  acetaminophen (TYLENOL) 325 MG tablet Take 2 tablets (650 mg total) by mouth every 4 (four) hours as needed for mild pain. 04/07/15   Love, Evlyn Kanner, PA-C  ALPRAZolam (XANAX) 0.25 MG tablet TAKE 1 TABLET BY MOUTH AT NIGHT AS NEEDED FOR ANXIETY/SLEEP 08/31/16   [provider]  amLODipine (NORVASC) 5 MG tablet Take 5 mg by mouth daily. 08/23/15   [provider]  atorvastatin (LIPITOR) 10 MG tablet Take 10 mg by mouth daily.    [provider]  Cholecalciferol (VITAMIN D3) 1000 units CAPS Take 5,000 Units by mouth daily.     [provider]  clopidogrel (PLAVIX) 75 MG tablet Take 1 tablet (75 mg total) by mouth daily. 04/07/15   Love, Evlyn Kanner, PA-C  CRANBERRY-VITAMIN C PO Take 4,200 mg by mouth daily.     [provider]  ferrous sulfate 325 (65 FE) MG tablet Take 325 mg by mouth daily. 07/07/16   [provider]  levothyroxine (SYNTHROID, LEVOTHROID) 25 MCG tablet Take 1 tablet by mouth  daily 12/25/14   Plotnikov, Georgina Quint, MD  Melatonin 10 MG TABS Take 1 tablet  by mouth at bedtime.    [provider]  metoprolol (LOPRESSOR) 50 MG tablet Take 50 mg by mouth 2 (two) times daily. 08/30/15   [provider]  vitamin B-12 (CYANOCOBALAMIN) 1000 MCG tablet Take 1,000 mcg by mouth daily.     [provider]    Physical Exam: Vitals:   05/02/17 2215 05/02/17 2230 05/02/17 2300 05/02/17 2315  BP: (!) 125/59 (!) 108/56 (!) 121/56 (!) 115/58  Pulse: 61 62 (!) 59 (!) 54  Resp: 16 (!) Temp:      SpO2: 94% 95% 93% 93%  Weight:        Constitutional: NAD, calm, comfortable Eyes: PERRL, lids and conjunctivae normal ENMT: Mucous membranes are moist. Posterior pharynx clear of any exudate or lesions.Normal dentition. Hard of  hearing Neck: normal, supple, no masses, no thyromegaly Respiratory: clear to auscultation bilaterally, no wheezing, no crackles. Normal respiratory effort. No accessory muscle use.  Cardiovascular: Regular rate and rhythm, no murmurs / rubs / gallops. No extremity edema. 2+ pedal pulses. No carotid bruits.  Abdomen: no tenderness, no masses palpated. No hepatosplenomegaly. Bowel sounds positive.  Musculoskeletal: no clubbing / cyanosis. No joint deformity upper and lower extremities. Good ROM, no contractures. Normal muscle tone.  Skin: no rashes, lesions, ulcers. No induration Neurologic: Left Homonymous hemianopsia present Psychiatric: Normal judgment and insight. Alert and oriented x 3. Normal mood.    Labs on Admission: I have personally reviewed following labs and imaging studies  CBC:  Recent Labs Lab 05/02/17 1954 05/02/17 2005  WBC 5.7  --   NEUTROABS 3.3  --   HGB 12.8 12.6  HCT 39.7 37.0  MCV 95.9  --   PLT 211  --    Basic Metabolic Panel:  Recent Labs Lab 05/02/17 1954 05/02/17 2005  NA 137 142  K 3.9 3.9  CL 106 104  CO2 25  --   GLUCOSE 173* 172*  BUN 13 15  CREATININE 0.76 0.80  CALCIUM 9.1  --    GFR: CrCl cannot be calculated (Unknown ideal weight.). Liver Function Tests:  Recent Labs Lab 05/02/17 1954  AST 27  ALT 18  ALKPHOS 56  BILITOT 0.6  PROT 6.7  ALBUMIN 3.7   No results for input(s): LIPASE, AMYLASE in the last 168 hours. No results for input(s): AMMONIA in the last 168 hours. Coagulation Profile:  Recent Labs Lab 05/02/17 1954  INR 1.02   Cardiac Enzymes: No results for input(s): CKTOTAL, CKMB, CKMBINDEX, TROPONINI in the last 168 hours. BNP (last 3 results) No results for input(s): PROBNP in the last 8760 hours. HbA1C: No results for input(s): HGBA1C in the last 72 hours. CBG:  Recent Labs Lab 05/02/17 1952  GLUCAP 177*   Lipid Profile: No results for input(s): CHOL, HDL, LDLCALC, TRIG, CHOLHDL, LDLDIRECT in the  last 72 hours. Thyroid Function Tests: No results for input(s): TSH, T4TOTAL, FREET4, T3FREE, THYROIDAB in the last 72 hours. Anemia Panel: No results for input(s): VITAMINB12, FOLATE, FERRITIN, TIBC, IRON, RETICCTPCT in the last 72 hours. Urine analysis:    Component Value Date/Time   COLORURINE YELLOW 05/02/2017 2042   APPEARANCEUR HAZY (A) 05/02/2017 2042   LABSPEC 1.012 05/02/2017 2042   PHURINE 5.0 05/02/2017 2042   GLUCOSEU NEGATIVE 05/02/2017 2042   HGBUR SMALL (A) 05/02/2017 2042   BILIRUBINUR NEGATIVE 05/02/2017 2042   KETONESUR NEGATIVE 05/02/2017 2042   PROTEINUR NEGATIVE 05/02/2017 2042   UROBILINOGEN 1.0 03/23/2015 0257   NITRITE NEGATIVE 05/02/2017  2042   LEUKOCYTESUR LARGE (A) 05/02/2017 2042    Radiological Exams on Admission: Ct Head Code Stroke Wo Contrast  Addendum Date: 05/03/2017   ADDENDUM REPORT: 05/03/2017 00:01 ADDENDUM: Findings communicated by telephone on 05/02/2017 at 8:20 p.m. to Dr. Roseanne Reno. Electronically Signed   By: Rise Mu M.D.   On: 05/03/2017 00:01   Result Date: 05/03/2017 CLINICAL DATA:  Code stroke. Initial evaluation for acute right-sided weakness. EXAM: CT HEAD WITHOUT CONTRAST TECHNIQUE: Contiguous axial images were obtained from the base of the skull through the vertex without intravenous contrast. COMPARISON:  Prior MRI from 09/03/2015. FINDINGS: Brain: Advanced cerebral atrophy with chronic microvascular ischemic disease. Encephalomalacia within the posterior right MCA distribution, consistent with remote infarct. Few scattered remote right cerebellar infarcts. No acute intracranial hemorrhage. No evidence for acute large vessel territory infarct. No mass lesion, midline shift or mass effect. No hydrocephalus. No extra-axial fluid collection. Vascular: No worrisome hyperdense vessel. Scattered intracranial atherosclerosis noted. Skull: Scalp soft tissues and calvarium within normal limits. Sinuses/Orbits: Globes normal soft tissues  normal. Patient status post lens extraction bilaterally. Paranasal sinuses and mastoids are clear. Other: None. ASPECTS [facility] Stroke Program Early CT Score) - Ganglionic level infarction (caudate, lentiform nuclei, internal capsule, insula, M1-M3 cortex): 7 - Supraganglionic infarction (M4-M6 cortex): 3 Total score (0-10 with 10 being normal): 10 IMPRESSION: 1. No acute intracranial infarct or other process identified. 2. ASPECTS is 10. 3. Remote right MCA and right cerebellar infarcts. 4. Advanced cerebral atrophy with chronic microvascular ischemic disease. Dr. Roseanne Reno of the Stroke Neurology service was paged regarding these results at 8 10 :p.m. on 05/02/2017. Results were subsequently communicated via secure text page to Dr. Roseanne Reno at 8:16 p.m. on 05/02/2017. Electronically Signed: By: Rise Mu M.D. On: 05/02/2017 20:17    EKG: Independently reviewed.  Assessment/Plan Principal Problem:   TIA (transient ischemic attack) Active Problems:   Essential hypertension   Homonymous hemianopia   History of right MCA stroke   Acute lower UTI    1. TIA - 1. TIA pathway 2. MRI pending 3. Continue plavix 4. See neuro consult note 5. Tele 2. UTI - 1. Rocephin 2. Culture pending 3. HTN - 1. Continue metoprolol 2. Holding amlodipine secondary to borderline BPs this evening in ED and possible TIA as noted above 4. H/o R MCA stroke - with homonymous hemianopsia  DVT prophylaxis: Lovenox Code Status: DNR/DNI - per patient Family Communication: No family in room Disposition Plan: Home after admit Consults called: Neuro Admission status: Place in obs   Leah Figueroa. DO [facility] Pager [phone]  If 7AM-7PM, please contact day team taking care of patient [email or web address] Password [email or web address]  05/03/2017, 12:12 AM

## 2017-05-04 DIAGNOSIS — R410 Disorientation, unspecified: Secondary | ICD-10-CM

## 2017-05-04 MED ORDER — SODIUM CHLORIDE 0.9 % IV SOLN: 8.0000 mg | Freq: Four times a day (QID) | INTRAVENOUS | Status: DC | PRN

## 2017-05-04 MED ORDER — ONDANSETRON 4 MG PO TBDP
4.0000 mg | ORAL_TABLET | Freq: Once | ORAL | Status: AC
  Administered 2017-05-04: 4 mg via ORAL
  Filled 2017-05-04: qty 1

## 2017-05-04 MED ORDER — LACTATED RINGERS IV SOLN
INTRAVENOUS | Status: AC
  Administered 2017-05-04: 21:00:00 via INTRAVENOUS

## 2017-05-04 MED ORDER — ONDANSETRON HCL 4 MG/2ML IJ SOLN: 4.0000 mg | Freq: Three times a day (TID) | INTRAMUSCULAR | Status: DC | PRN

## 2017-05-04 MED ORDER — SODIUM CHLORIDE 0.9 % IV SOLN
8.0000 mg | Freq: Once | INTRAVENOUS | Status: AC
  Administered 2017-05-04: 8 mg via INTRAVENOUS
  Filled 2017-05-04: qty 4

## 2017-05-04 MED ORDER — CEPHALEXIN 500 MG PO CAPS: 500.0000 mg | ORAL_CAPSULE | Freq: Two times a day (BID) | ORAL | 0 refills | Status: DC

## 2017-05-04 MED ORDER — MECLIZINE HCL 25 MG PO TABS
25.0000 mg | ORAL_TABLET | Freq: Three times a day (TID) | ORAL | Status: DC
  Administered 2017-05-04 – 2017-05-05 (×2): 25 mg via ORAL
  Filled 2017-05-04 (×2): qty 1

## 2017-05-04 MED ORDER — ONDANSETRON HCL 8 MG PO TABS: 8.0000 mg | ORAL_TABLET | Freq: Three times a day (TID) | ORAL | 0 refills | Status: DC | PRN

## 2017-05-04 NOTE — Discharge Summary (Addendum)
Physician Discharge Summary  Leah Figueroa ZOX:[id number] DOB: January 23, 1923 DOA: 05/02/2017  PCP: Merlene Laughter, MD  Admit date: 05/02/2017 Discharge date: 05/05/2017  Admitted From: Home Disposition: Home   Recommendations for Outpatient Follow-up:  1. Follow up with PCP in 1-2 weeks. 2. Please follow up on the following pending results: Urine culture. D'C on keflex.  3. Consider outpatient vestibular PT if vertiginous symptoms continue.  Home Health: None recommended Equipment/Devices: 3 in 1, shower chair Discharge Condition: Stable CODE STATUS: DNR (confirmed with only daughter) Diet recommendation: Heart healthy  Brief/Interim Summary: Leah Figueroa is a 83 y.o. female with a history of right CVA, HTN, hypothyroidism, breast CA, arthritis and iron deficiency who presented to the ED 9/17 for acute onset right sided weakness and slurred speech, though these deficits resolved on arrival to the ED. She was brought in for observation for TIA work up. Urinalysis was suggestive of UTI, ceftriaxone started, and keflex will be continued for >100k GNR's on urine culture.   Discharge Diagnoses:  Principal Problem:   Acute lower UTI Active Problems:   Essential hypertension   Homonymous hemianopia   History of right MCA stroke   TIA (transient ischemic attack)  TIA: Right-sided weakness resolved, speech at baseline. Chronic left hemianopia and hemisensory changes from right MCA CVA, stable. MRI negative for acute infarct. Carotid duplex with insignificant stenosis. Echo without CES. HbA1c 5%.  - Consider augmenting lipitor  due to LDL 77, though HDL is 40.  - Antiplatelet per neurology: Continue plavix. - DC to home with supportive environment. 24-hr care by daughter and night tech. - 3 in 1 and shower tub/chair ordered.  - Discussed expected progression of dementia with daughter, information given in writing as well.   GNR UTI: Likely provocative factor in resolved deficits.  -  Continue antibiotics: CTX > keflex pending culture results.  Essential HTN: Chronic, stable - Continue metoprolol, norvasc.  Hypothyroidism: Chronic, stable.  - Continue synthroid.  Iron deficiency anemia: Hgb wnl.  - Continue po iron.   Discharge Instructions Discharge Instructions    Discharge instructions    Complete by:  As directed    - This was NOT a stroke, but probably related to a UTI. You will need to take keflex twice daily for 5 more days for a UTI.  - Continue taking other medications as you were. - Schedule a hospital follow up appointment with Dr. Pete Glatter in the next 1 - 2 weeks.   Discharge patient    Complete by:  As directed    Discharge disposition:  01-Home or [facility]   Discharge patient date:  05/05/2017     Allergies as of 05/05/2017   No Known Allergies     Medication List    TAKE these medications   acetaminophen 325 MG tablet Commonly known as:  TYLENOL Take 2 tablets (650 mg total) by mouth every 4 (four) hours as needed for mild pain.   ALPRAZolam 0.25 MG tablet Commonly known as:  XANAX TAKE 0.25 MG BY MOUTH AT NIGHT AS NEEDED FOR ANXIETY/SLEEP   amLODipine 5 MG tablet Commonly known as:  NORVASC Take 5 mg by mouth daily.   atorvastatin 10 MG tablet Commonly known as:  LIPITOR Take 10 mg by mouth daily.   cephALEXin 500 MG capsule Commonly known as:  KEFLEX Take 1 capsule (500 mg total) by mouth 2 (two) times daily.   clopidogrel 75 MG tablet Commonly known as:  PLAVIX Take 1 tablet (75 mg total)  by mouth daily.   levothyroxine 25 MCG tablet Commonly known as:  SYNTHROID, LEVOTHROID Take 1 tablet by mouth  daily What changed:  See the new instructions.   meclizine 25 MG tablet Commonly known as:  ANTIVERT Take 1 tablet (25 mg total) by mouth 3 (three) times daily.   Melatonin 10 MG Tabs Take 10 mg by mouth at bedtime.   metoprolol tartrate 50 MG tablet Commonly known as:  LOPRESSOR Take 50 mg by mouth 2 (two) times  daily.   ondansetron 8 MG tablet Commonly known as:  ZOFRAN Take 1 tablet (8 mg total) by mouth every 8 (eight) hours as needed for nausea or vomiting.   vitamin B-12 1000 MCG tablet Commonly known as:  CYANOCOBALAMIN Take 1,000 mcg by mouth daily.   Vitamin D3 5000 units Caps Take 5,000 Units by mouth daily.            Durable Medical Equipment        Start     Ordered   05/04/17 0750  For home use only DME 3 n 1  Once     05/04/17 0749   05/04/17 0750  For home use only DME Shower stool  Once     05/04/17 0749       Discharge Care Instructions        Start     Ordered   05/05/17 0000  meclizine (ANTIVERT) 25 MG tablet  3 times daily     05/05/17 1354   05/05/17 0000  Discharge patient    Question Answer Comment  Discharge disposition 01-Home or [facility]   Discharge patient date 05/05/2017      05/05/17 1354   05/04/17 0000  cephALEXin (KEFLEX) 500 MG capsule  2 times daily     05/04/17 1037   05/04/17 0000  Discharge instructions    Comments:  - This was NOT a stroke, but probably related to a UTI. You will need to take keflex twice daily for 5 more days for a UTI.  - Continue taking other medications as you were. - Schedule a hospital follow up appointment with Dr. Pete Glatter in the next 1 - 2 weeks.   05/04/17 1037   05/04/17 0000  ondansetron (ZOFRAN) 8 MG tablet  Every 8 hours PRN     05/04/17 1717     Follow-up Information    Stoneking, Hal, MD. Schedule an appointment as soon as possible for a visit in 1 week(s).   Specialty:  Internal Medicine Contact information: [address] [place] [address] [phone]          No Known Allergies  Consultations:  Stroke neurology  Procedures/Studies: Mr Brain Wo Contrast  Result Date: 05/03/2017 CLINICAL DATA:  Acute right-sided weakness. EXAM: MRI HEAD WITHOUT CONTRAST MRA HEAD WITHOUT CONTRAST TECHNIQUE: Multiplanar, multiecho pulse sequences of the brain and surrounding  structures were obtained without intravenous contrast. Angiographic images of the head were obtained using MRA technique without contrast. COMPARISON:  Brain MRI 09/03/2015 FINDINGS: MRI HEAD FINDINGS Brain: The midline structures are normal. No focal diffusion restriction to indicate acute infarct. No intraparenchymal hemorrhage. There are multiple old cerebellar infarcts. There is an old right posterior MCA territory infarct. There is confluent periventricular white matter hyperintense T2 weighted signal, consistent with advanced chronic hypertensive microangiopathy. No mass lesion. There are numerous foci of chronic microhemorrhage, mostly located within the cerebellum. No hydrocephalus, age advanced atrophy or lobar predominant volume loss. No dural abnormality or  extra-axial collection. Skull and upper cervical spine: The visualized skull base, calvarium, upper cervical spine and extracranial soft tissues are normal. Sinuses/Orbits: No fluid levels or advanced mucosal thickening. No mastoid effusion. Normal orbits. MRA HEAD FINDINGS Intracranial internal carotid arteries: Normal. Anterior cerebral arteries: Normal. Middle cerebral arteries: Symmetric decreased flow related enhancement within the inferior portions of the bilateral M1 and M2 segments is likely artifactual. Otherwise normal. Posterior communicating arteries: Present on the right. Posterior cerebral arteries: There is decreased flow related enhancement within the P2 segments, predominantly in there are inferior at. Again, as with the middle cerebral arteries, this is suspected to be artifactual. Basilar artery: Normal. Vertebral arteries: Left dominant. Normal. Superior cerebellar arteries: Normal. Anterior inferior cerebellar arteries: Normal. Posterior inferior cerebellar arteries: Normal. IMPRESSION: 1. No acute intracranial abnormality. 2. Multiple old infarcts and advanced white matter disease consistent with chronic hypertensive  microangiopathy. 3. Numerous chronic micro hemorrhages, predominantly located in the cerebellum. The distribution is most consistent with chronic hypertension. 4. No emergent large vessel occlusion. The symmetric, diminished enhancement within the inferior courses of both of the middle and posterior cerebral arteries is suspected to be artifactual. No other stenosis. Electronically Signed   By: Deatra Robinson M.D.   On: 05/03/2017 02:04   Ct Abdomen Pelvis W Contrast  Result Date: 05/05/2017 CLINICAL DATA:  Emesis and weakness, history of breast carcinoma EXAM: CT ABDOMEN AND PELVIS WITH CONTRAST TECHNIQUE: Multidetector CT imaging of the abdomen and pelvis was performed using the standard protocol following bolus administration of intravenous contrast. The examination is somewhat limited by motion artifact. CONTRAST:  ISOVUE-300 IOPAMIDOL (ISOVUE-300) INJECTION 61% COMPARISON:  None. FINDINGS: Lower chest: Mild left lower lobe atelectatic changes are noted. No focal confluent infiltrate is seen Hepatobiliary: The gallbladder has been surgically removed. Mild fullness of the biliary tree is noted. The liver is otherwise within normal limits. Pancreas: Unremarkable. No pancreatic ductal dilatation or surrounding inflammatory changes. Spleen: Normal in size without focal abnormality. Adrenals/Urinary Tract: The adrenal glands are within normal limits bilaterally. Kidneys demonstrate a normal enhancement pattern. Scattered cysts are noted throughout the left kidney. No obstructive changes are seen. No renal calculi are noted. The bladder is well distended. Stomach/Bowel: Scattered diverticular change of the colon is noted. No inflammatory or obstructive changes in the bowel are noted. The appendix is within normal limits. Vascular/Lymphatic: Aortic atherosclerosis. No enlarged abdominal or pelvic lymph nodes. Reproductive: Uterus and bilateral adnexa are unremarkable. Other: No abdominal wall hernia or  abnormality. No abdominopelvic ascites. Musculoskeletal: Degenerative changes of lumbar spine are seen. No acute bony abnormality is noted. IMPRESSION: Lower lobe atelectatic changes. Scattered chronic changes as described. No acute abnormality is noted. Electronically Signed   By: Alcide Clever M.D.   On: 05/05/2017 11:00   Mr Maxine Glenn Head Wo Contrast  Result Date: 05/03/2017 CLINICAL DATA:  Acute right-sided weakness. EXAM: MRI HEAD WITHOUT CONTRAST MRA HEAD WITHOUT CONTRAST TECHNIQUE: Multiplanar, multiecho pulse sequences of the brain and surrounding structures were obtained without intravenous contrast. Angiographic images of the head were obtained using MRA technique without contrast. COMPARISON:  Brain MRI 09/03/2015 FINDINGS: MRI HEAD FINDINGS Brain: The midline structures are normal. No focal diffusion restriction to indicate acute infarct. No intraparenchymal hemorrhage. There are multiple old cerebellar infarcts. There is an old right posterior MCA territory infarct. There is confluent periventricular white matter hyperintense T2 weighted signal, consistent with advanced chronic hypertensive microangiopathy. No mass lesion. There are numerous foci of chronic microhemorrhage, mostly located within the cerebellum.  No hydrocephalus, age advanced atrophy or lobar predominant volume loss. No dural abnormality or extra-axial collection. Skull and upper cervical spine: The visualized skull base, calvarium, upper cervical spine and extracranial soft tissues are normal. Sinuses/Orbits: No fluid levels or advanced mucosal thickening. No mastoid effusion. Normal orbits. MRA HEAD FINDINGS Intracranial internal carotid arteries: Normal. Anterior cerebral arteries: Normal. Middle cerebral arteries: Symmetric decreased flow related enhancement within the inferior portions of the bilateral M1 and M2 segments is likely artifactual. Otherwise normal. Posterior communicating arteries: Present on the right. Posterior cerebral  arteries: There is decreased flow related enhancement within the P2 segments, predominantly in there are inferior at. Again, as with the middle cerebral arteries, this is suspected to be artifactual. Basilar artery: Normal. Vertebral arteries: Left dominant. Normal. Superior cerebellar arteries: Normal. Anterior inferior cerebellar arteries: Normal. Posterior inferior cerebellar arteries: Normal. IMPRESSION: 1. No acute intracranial abnormality. 2. Multiple old infarcts and advanced white matter disease consistent with chronic hypertensive microangiopathy. 3. Numerous chronic micro hemorrhages, predominantly located in the cerebellum. The distribution is most consistent with chronic hypertension. 4. No emergent large vessel occlusion. The symmetric, diminished enhancement within the inferior courses of both of the middle and posterior cerebral arteries is suspected to be artifactual. No other stenosis. Electronically Signed   By: Deatra Robinson M.D.   On: 05/03/2017 02:04   Ct Head Code Stroke Wo Contrast  Addendum Date: 05/03/2017   ADDENDUM REPORT: 05/03/2017 00:01 ADDENDUM: Findings communicated by telephone on 05/02/2017 at 8:20 p.m. to Dr. Roseanne Reno. Electronically Signed   By: Rise Mu M.D.   On: 05/03/2017 00:01   Result Date: 05/03/2017 CLINICAL DATA:  Code stroke. Initial evaluation for acute right-sided weakness. EXAM: CT HEAD WITHOUT CONTRAST TECHNIQUE: Contiguous axial images were obtained from the base of the skull through the vertex without intravenous contrast. COMPARISON:  Prior MRI from 09/03/2015. FINDINGS: Brain: Advanced cerebral atrophy with chronic microvascular ischemic disease. Encephalomalacia within the posterior right MCA distribution, consistent with remote infarct. Few scattered remote right cerebellar infarcts. No acute intracranial hemorrhage. No evidence for acute large vessel territory infarct. No mass lesion, midline shift or mass effect. No hydrocephalus. No  extra-axial fluid collection. Vascular: No worrisome hyperdense vessel. Scattered intracranial atherosclerosis noted. Skull: Scalp soft tissues and calvarium within normal limits. Sinuses/Orbits: Globes normal soft tissues normal. Patient status post lens extraction bilaterally. Paranasal sinuses and mastoids are clear. Other: None. ASPECTS [facility] Stroke Program Early CT Score) - Ganglionic level infarction (caudate, lentiform nuclei, internal capsule, insula, M1-M3 cortex): 7 - Supraganglionic infarction (M4-M6 cortex): 3 Total score (0-10 with 10 being normal): 10 IMPRESSION: 1. No acute intracranial infarct or other process identified. 2. ASPECTS is 10. 3. Remote right MCA and right cerebellar infarcts. 4. Advanced cerebral atrophy with chronic microvascular ischemic disease. Dr. Roseanne Reno of the Stroke Neurology service was paged regarding these results at 8 10 :p.m. on 05/02/2017. Results were subsequently communicated via secure text page to Dr. Roseanne Reno at 8:16 p.m. on 05/02/2017. Electronically Signed: By: Rise Mu M.D. On: 05/02/2017 20:17    Subjective: No complaints. Daughter reports waxing/waning confusion. No focal signs.   Discharge Exam: Vitals:   05/05/17 0552 05/05/17 1326  BP: (!) 156/58 (!) 157/95  Pulse: 60 72  Resp: 16   Temp: 98 F (36.7 C)   SpO2: 95%    Gen: Well-appearing elderly female in no distress Pulm: Clear and nonlabored on room air  CV: RRR, no murmur, no JVD, no edema GI: Soft, NT, ND, +BS  Neuro: Alert. Speech normal. HOH and left visual field cut noted, otherwise CN's intact. Left arm/leg with intact but abnormal sensation. Moves all extremities.  Skin: No rashes, lesions no ulcers  Labs: Basic Metabolic Panel:  Recent Labs Lab 05/02/17 1954 05/02/17 2005 05/05/17 0459  NA 137 142 137  K 3.9 3.9 3.6  CL 106 104 100*  CO2 25  --  27  GLUCOSE 173* 172* 118*  BUN CREATININE 0.76 0.80 0.58  CALCIUM 9.1  --  9.8   Liver  Function Tests:  Recent Labs Lab 05/02/17 1954 05/05/17 0459  AST 27 26  ALT 18 16  ALKPHOS 56 60  BILITOT 0.6 0.9  PROT 6.7 6.9  ALBUMIN 3.7 3.7   CBC:  Recent Labs Lab 05/02/17 1954 05/02/17 2005 05/05/17 0459  WBC 5.7  --  8.0  NEUTROABS 3.3  --   --   HGB 12.8 12.6 14.5  HCT 39.7 37.0 41.4  MCV 95.9  --  92.2  PLT 211  --  206   Hgb A1c  Recent Labs  05/03/17 0624  HGBA1C 5.0   Lipid Profile  Recent Labs  05/03/17 0624  CHOL 129  HDL 40*  LDLCALC 77  TRIG 59  CHOLHDL 3.2   Urinalysis    Component Value Date/Time   COLORURINE YELLOW 05/02/2017 2042   APPEARANCEUR HAZY (A) 05/02/2017 2042   LABSPEC 1.012 05/02/2017 2042   PHURINE 5.0 05/02/2017 2042   GLUCOSEU NEGATIVE 05/02/2017 2042   HGBUR SMALL (A) 05/02/2017 2042   BILIRUBINUR NEGATIVE 05/02/2017 2042   KETONESUR NEGATIVE 05/02/2017 2042   PROTEINUR NEGATIVE 05/02/2017 2042   UROBILINOGEN 1.0 03/23/2015 0257   NITRITE NEGATIVE 05/02/2017 2042   LEUKOCYTESUR LARGE (A) 05/02/2017 2042    Microbiology Recent Results (from the past 240 hour(s))  Culture, Urine     Status: Abnormal   Collection Time: 05/02/17  8:42 PM  Result Value Ref Range Status   Specimen Description URINE, RANDOM  Final   Special Requests NONE  Final   Culture >=100,000 COLONIES/mL KLEBSIELLA OXYTOCA (A)  Final   Report Status 05/05/2017 FINAL  Final   Organism ID, Bacteria KLEBSIELLA OXYTOCA (A)  Final      Susceptibility   Klebsiella oxytoca - MIC*    AMPICILLIN >=32 RESISTANT Resistant     CEFAZOLIN 8 SENSITIVE Sensitive     CEFTRIAXONE <=1 SENSITIVE Sensitive     CIPROFLOXACIN <=0.25 SENSITIVE Sensitive     GENTAMICIN <=1 SENSITIVE Sensitive     IMIPENEM <=0.25 SENSITIVE Sensitive     NITROFURANTOIN 64 INTERMEDIATE Intermediate     TRIMETH/SULFA <=20 SENSITIVE Sensitive     AMPICILLIN/SULBACTAM 16 INTERMEDIATE Intermediate     PIP/TAZO <=4 SENSITIVE Sensitive     Extended ESBL NEGATIVE Sensitive     *  >=100,000 COLONIES/mL KLEBSIELLA OXYTOCA    Time coordinating discharge: Approximately 40 minutes  [name], MD  [facility] 05/05/2017, 2:14 PM Pager [phone]

## 2017-05-04 NOTE — Progress Notes (Signed)
Physical Therapy Treatment Patient Details Name: Leah Figueroa MRN: [id number] DOB: 10/21/1922 Today's Date: 05/04/2017    History of Present Illness Pt is a 83 y/o female with pmh significant for prior R sided CVA with L homonymous hemianopsia, HTN, CAD, presenting to ED with R sided weakness.  Work up found UTI,  MRI showed no new acute abnormalities.    PT Comments    Pt found to be too nauseated to stand/ambulate, nor was she able to complete any testing to rule out positional vertigo.  Will assess as able 9/19.   Follow Up Recommendations  No PT follow up     Equipment Recommendations       Recommendations for Other Services       Precautions / Restrictions Precautions Precautions: Fall    Mobility  Bed Mobility   Bed Mobility: Rolling;Supine to Sit;Sit to Supine Rolling: Modified independent (Device/Increase time) Sidelying to sit: Min assist Supine to sit: Min assist     General bed mobility comments: unable to proceed further due to each time she come to sit she gets nauseated, vomiting x1.  No signs of nystagmus  during these maneuvers, but appears motion related.  Transfers                    Ambulation/Gait                 Stairs            Wheelchair Mobility    Modified Rankin (Stroke Patients Only)       Balance   Sitting-balance support: No upper extremity supported;Feet supported Sitting balance-[name] Scale: Good                                      Cognition Arousal/Alertness: Awake/alert Behavior During Therapy: WFL for tasks assessed/performed Overall Cognitive Status: No family/caregiver present to determine baseline cognitive functioning                                        Exercises      General Comments General comments (skin integrity, edema, etc.): pt unable to participate due to suspected vertigo, but symptoms too acute to do vestibular testing.  Will try tomorrow as  able.      Pertinent Vitals/Pain Pain Assessment: No/denies pain    Home Living                      Prior Function            PT Goals (current goals can now be found in the care plan section) Acute Rehab PT Goals Patient Stated Goal: home as soon as I can PT Goal Formulation: With patient Time For Goal Achievement: 05/10/17 Potential to Achieve Goals: Good Progress towards PT goals: Progressing toward goals    Frequency    Min 3X/week      PT Plan Current plan remains appropriate    Co-evaluation              AM-PAC PT "6 Clicks" Daily Activity  Outcome Measure  Difficulty turning over in bed (including adjusting bedclothes, sheets and blankets)?: A Little Difficulty moving from lying on back to sitting on the side of the bed? : A Little Difficulty sitting down on and standing up  from a chair with arms (e.g., wheelchair, bedside commode, etc,.)?: A Little Help needed moving to and from a bed to chair (including a wheelchair)?: A Little Help needed walking in hospital room?: A Little Help needed climbing 3-5 steps with a railing? : A Little 6 Click Score: 18    End of Session   Activity Tolerance: Patient tolerated treatment well;Other (comment) (treatment limited by nausea) Patient left: in bed;with call bell/phone within reach;with nursing/sitter in room Nurse Communication: Mobility status PT Visit Diagnosis: Other abnormalities of gait and mobility (R26.89)     Time: 1610-9604 PT Time Calculation (min) (ACUTE ONLY): 14 min  Charges:  $Therapeutic Activity: 8-22 mins                    G Codes:       05/12/2017  Leah Figueroa, PT [phone]  (pager)   Leah Figueroa 2017-05-12, 6:34 PM

## 2017-05-04 NOTE — Discharge Instructions (Signed)
Transient Ischemic Attack A transient ischemic attack (TIA) is a "warning stroke" that causes stroke-like symptoms. Unlike a stroke, a TIA does not cause permanent damage to the brain. The symptoms of a TIA can happen very fast and do not last long. It is important to know the symptoms of a TIA and what to do. This can help prevent a major stroke or death. What are the causes? A TIA is caused by a temporary blockage in an artery in the brain or neck (carotid artery). The blockage does not allow the brain to get the blood supply it needs and can cause different symptoms. The blockage can be caused by either:  A blood clot.  Fatty buildup (plaque) in a neck or brain artery.  What increases the risk?  High blood pressure (hypertension).  High cholesterol.  Diabetes mellitus.  Heart disease.  The buildup of plaque in the blood vessels (peripheral artery disease or atherosclerosis).  The buildup of plaque in the blood vessels that provide blood and oxygen to the brain (carotid artery stenosis).  An abnormal heart rhythm (atrial fibrillation).  Obesity.  Using any tobacco products, including cigarettes, chewing tobacco, or electronic cigarettes.  Taking oral contraceptives, especially in combination with using tobacco.  Physical inactivity.  A diet high in fats, salt (sodium), and calories.  Excessive alcohol use.  Use of illegal drugs (especially cocaine and methamphetamine).  Being female.  Being African American.  Being over the age of 86 years.  Family history of stroke.  Previous history of blood clots, stroke, TIA, or heart attack.  Sickle cell disease. What are the signs or symptoms? TIA symptoms are the same as a stroke but are temporary. These symptoms usually develop suddenly, or may be newly present upon waking from sleep:  Sudden weakness or numbness of the face, arm, or leg, especially on one side of the body.  Sudden trouble walking or difficulty moving  arms or legs.  Sudden confusion.  Sudden personality changes.  Trouble speaking (aphasia) or understanding.  Difficulty swallowing.  Sudden trouble seeing in one or both eyes.  Double vision.  Dizziness.  Loss of balance or coordination.  Sudden severe headache with no known cause.  Trouble reading or writing.  Loss of bowel or bladder control.  Loss of consciousness.  How is this diagnosed? Your health care provider may be able to determine the presence or absence of a TIA based on your symptoms, history, and physical exam. CT scan of the brain is usually performed to help identify a TIA. Other tests may include:  Electrocardiography (ECG).  Continuous heart monitoring.  Echocardiography.  Carotid ultrasonography.  MRI.  A scan of the brain circulation.  Blood tests.  How is this treated? Since the symptoms of TIA are the same as a stroke, it is important to seek treatment as soon as possible. You may need a medicine to dissolve a blood clot (thrombolytic) if that is the cause of the TIA. This medicine cannot be given if too much time has passed. Treatment may also include:  Rest, oxygen, fluids through an IV tube, and medicines to thin the blood (anticoagulants).  Measures will be taken to prevent short-term and long-term complications, including infection from breathing foreign material into the lungs (aspiration pneumonia), blood clots in the legs, and falls.  Procedures to either remove plaque in the carotid arteries or dilate carotid arteries that have narrowed due to plaque. Those procedures are: ? Carotid endarterectomy. ? Carotid angioplasty and stenting.  Medicines  and diet may be used to address diabetes, high blood pressure, and other underlying risk factors.  Follow these instructions at home:  Take medicines only as directed by your health care provider. Follow the directions carefully. Medicines may be used to control risk factors for a stroke.  Be sure you understand all your medicine instructions.  You may be told to take aspirin or the anticoagulant warfarin. Warfarin needs to be taken exactly as instructed. ? Taking too much or too little warfarin is dangerous. Too much warfarin increases the risk of bleeding. Too little warfarin continues to allow the risk for blood clots. While taking warfarin, you will need to have regular blood tests to measure your blood clotting time. A PT blood test measures how long it takes for blood to clot. Your PT is used to calculate another value called an INR. Your PT and INR help your health care provider to adjust your dose of warfarin. The dose can change for many reasons. It is critically important that you take warfarin exactly as prescribed. ? Many foods, especially foods high in vitamin K can interfere with warfarin and affect the PT and INR. Foods high in vitamin K include spinach, kale, broccoli, cabbage, collard and turnip greens, Brussels sprouts, peas, cauliflower, seaweed, and parsley, as well as beef and pork liver, green tea, and soybean oil. You should eat a consistent amount of foods high in vitamin K. Avoid major changes in your diet, or notify your health care provider before changing your diet. Arrange a visit with a dietitian to answer your questions. ? Many medicines can interfere with warfarin and affect the PT and INR. You must tell your health care provider about any and all medicines you take; this includes all vitamins and supplements. Be especially cautious with aspirin and anti-inflammatory medicines. Do not take or discontinue any prescribed or over-the-counter medicine except on the advice of your health care provider or pharmacist. ? Warfarin can have side effects, such as excessive bruising or bleeding. You will need to hold pressure over cuts for longer than usual. Your health care provider or pharmacist will discuss other potential side effects. ? Avoid sports or activities that  may cause injury or bleeding. ? Be careful when shaving, flossing your teeth, or handling sharp objects. ? Alcohol can change the body's ability to handle warfarin. It is best to avoid alcoholic drinks or consume only very small amounts while taking warfarin. Notify your health care provider if you change your alcohol intake. ? Notify your dentist or other health care providers before procedures.  Eat a diet that includes 5 or more servings of fruits and vegetables each day. This may reduce the risk of stroke. Certain diets may be prescribed to address high blood pressure, high cholesterol, diabetes, or obesity. ? A diet low in sodium, saturated fat, trans fat, and cholesterol is recommended to manage high blood pressure. ? A diet low in saturated fat, trans fat, and cholesterol, and high in fiber may control cholesterol levels. ? A controlled-carbohydrate, controlled-sugar diet is recommended to manage diabetes. ? A reduced-calorie diet that is low in sodium, saturated fat, trans fat, and cholesterol is recommended to manage obesity.  Maintain a healthy weight.  Stay physically active. It is recommended that you get at least 30 minutes of activity on most or all days.  Do not use any tobacco products, including cigarettes, chewing tobacco, or electronic cigarettes. If you need help quitting, ask your health care provider.  Limit alcohol intake  to no more than 1 drink per day for nonpregnant women and 2 drinks per day for men. One drink equals 12 ounces of beer, 5 ounces of wine, or 1 ounces of hard liquor.  Do not abuse drugs.  A safe home environment is important to reduce the risk of falls. Your health care provider may arrange for specialists to evaluate your home. Having grab bars in the bedroom and bathroom is often important. Your health care provider may arrange for equipment to be used at home, such as raised toilets and a seat for the shower.  Follow all instructions for follow-up  with your health care provider. This is very important. This includes any referrals and lab tests. Proper follow-up can prevent a stroke or another TIA from occurring. How is this prevented? The risk of a TIA can be decreased by appropriately treating high blood pressure, high cholesterol, diabetes, heart disease, and obesity, and by quitting smoking, limiting alcohol, and staying physically active. Contact a health care provider if:  You have personality changes.  You have difficulty swallowing.  You are seeing double.  You have dizziness.  You have a fever. Get help right away if: Any of the following symptoms may represent a serious problem that is an emergency. Do not wait to see if the symptoms will go away. Get medical help right away. Call your local emergency services (911 in [place] Do not drive yourself to the hospital.  You have sudden weakness or numbness of the face, arm, or leg, especially on one side of the body.  You have sudden trouble walking or difficulty moving arms or legs.  You have sudden confusion.  You have trouble speaking (aphasia) or understanding.  You have sudden trouble seeing in one or both eyes.  You have a loss of balance or coordination.  You have a sudden, severe headache with no known cause.  You have new chest pain or an irregular heartbeat.  You have a partial or total loss of consciousness.    Dementia Caregiver Guide Dementia is a term used to describe a number of symptoms that affect memory and thinking. The most common symptoms include:  Memory loss.  Trouble with language and communication.  Trouble concentrating.  Poor judgment.  Problems with reasoning.  Child-like behavior and language.  Extreme anxiety.  Angry outbursts.  Wandering from home or public places.  Dementia usually gets worse slowly over time. In the early stages, people with dementia can stay independent and safe with some help. In later stages, they  need help with daily tasks such as dressing, grooming, and using the bathroom. How to help the person with dementia cope Dementia can be frightening and confusing. Here are some tips to help the person with dementia cope with changes caused by the disease. General tips  Keep the person on track with his or her routine.  Try to identify areas where the person may need help.  Be supportive, patient, calm, and encouraging.  Gently remind the person that adjusting to changes takes time.  Help with the tasks that the person has asked for help with.  Keep the person involved in daily tasks and decisions as much as possible.  Encourage conversation, but try not to get frustrated or harried if the person struggles to find words or does not seem to appreciate your help. Communication tips  When the person is talking or seems frustrated, make eye contact and hold the person's hand.  Ask specific questions that need  yes or no answers.  Use simple words, short sentences, and a calm voice. Only give one direction at a time.  When offering choices, limit them to just 1 or 2.  Avoid correcting the person in a negative way.  If the person is struggling to find the right words, gently try to help him or her. How to recognize symptoms of stress Symptoms of stress in caregivers include:  Feeling frustrated or angry with the person with dementia.  Denying that the person has dementia or that his or her symptoms will not improve.  Feeling hopeless and unappreciated.  Difficulty sleeping.  Difficulty concentrating.  Feeling anxious, irritable, or depressed.  Developing stress-related health problems.  Feeling like you have too little time for your own life.  Follow these instructions at home:  Make sure that you and the person you are caring for: ? Get regular sleep. ? Exercise regularly. ? Eat regular, nutritious meals. ? Drink enough fluid to keep your urine clear or pale  yellow. ? Take over-the-counter and prescription medicines only as told by your health care providers. ? Attend all scheduled health care appointments.  Join a support group with others who are caregivers.  Ask about respite care resources so that you can have a regular break from the stress of caregiving.  Look for signs of stress in yourself and in the person you are caring for. If you notice signs of stress, take steps to manage it.  Consider any safety risks and take steps to avoid them.  Organize medications in a pill box for each day of the week.  Create a plan to handle any legal or financial matters. Get legal or financial advice if needed.  Keep a calendar in a central location to remind the person of appointments or other activities. Tips for reducing the risk of injury  Keep floors clear of clutter. Remove rugs, magazine racks, and floor lamps.  Keep hallways well lit, especially at night.  Put a handrail and nonslip mat in the bathtub or shower.  Put childproof locks on cabinets that contain dangerous items, such as medicines, alcohol, guns, toxic cleaning items, sharp tools or utensils, matches, and lighters.  Put the locks in places where the person cannot see or reach them easily. This will help ensure that the person does not wander out of the house and get lost.  Be prepared for emergencies. Keep a list of emergency phone numbers and addresses in a convenient area.  Remove car keys and lock garage doors so that the person does not try to get in the car and drive.  Have the person wear a bracelet that tracks locations and identifies the person as having memory problems. This should be worn at all times for safety. Where to find support: Many individuals and organizations offer support. These include:  Support groups for people with dementia and for caregivers.  Counselors or therapists.  Home health care services.  Adult day care centers.  Where to find  more information: Alzheimer's Association: [employer] Contact a health care provider if:  The person's health is rapidly getting worse.  You are no longer able to care for the person.  Caring for the person is affecting your physical and emotional health.  The person threatens himself or herself, you, or anyone else. Summary  Dementia is a term used to describe a number of symptoms that affect memory and thinking.  Dementia usually gets worse slowly over time.  Take steps to reduce the  person's risk of injury, and to plan for future care.  Caregivers need support, relief from caregiving, and time for their own lives. This information is not intended to replace advice given to you by your health care provider. Make sure you discuss any questions you have with your health care provider. Document Released: 07/06/2016 Document Revised: 07/06/2016 Document Reviewed: 07/06/2016 Elsevier Interactive Patient Education  2018 [employer].    Dementia Dementia is the loss of two or more brain functions, such as:  Memory.  Decision making.  Behavior.  Speaking.  Thinking.  Problem solving.  There are many types of dementia. The most common type is called progressive dementia. Progressive dementia gets worse with time and it is irreversible. An example of this type of dementia is Alzheimer disease. What are the causes? This condition may be caused by:  Nerve cell damage in the brain.  Genetic mutations.  Certain medicines.  Multiple small strokes.  An infection, such as chronic meningitis.  A metabolic problem, such as vitamin B12 deficiency or thyroid disease.  Pressure on the brain, such as from a tumor or blood clot.  What are the signs or symptoms? Symptoms of this condition include:  Sudden changes in mood.  Depression.  Problems with balance.  Changes in personality.  Poor short-term memory.  Agitation.  Delusions.  Hallucinations.  Having a  hard time: ? Speaking thoughts. ? Finding words. ? Solving problems. ? Doing familiar tasks. ? Understanding familiar ideas.  How is this diagnosed? This condition is diagnosed with an assessment by your health care provider. During this assessment, your health care provider will talk with you and your family, friends, or caregivers about your symptoms. A thorough medical history will be taken, and you will have a physical exam and tests. Tests may include:  Lab tests, such as blood or urine tests.  Imaging tests, such as a CT scan, PET scan, or MRI.  A lumbar puncture. This test involves removing and testing a small amount of the fluid that surrounds the brain and spinal cord.  An electroencephalogram (EEG). In this test, small metal discs are used to measure electrical activity in the brain.  Memory tests, cognitive tests, and neuropsychological tests. These tests evaluate brain function.  How is this treated? Treatment depends on the cause of the dementia. It may involve taking medicines that may help:  To control the dementia.  To slow down the disease.  To manage symptoms.  In some cases, treating the cause of the dementia can improve symptoms, reverse symptoms, or slow down how quickly the dementia gets worse. Your health care provider can help direct you to support groups, organizations, and other health care providers who can help with decisions about your care. Follow these instructions at home: Medicine  Take over-the-counter and prescription medicines only as told by your health care provider.  Avoid taking medicines that can affect thinking, such as pain or sleeping medicines. Lifestyle   Make healthy lifestyle choices: ? Be physically active as told by your health care provider. ? Do not use any tobacco products, such as cigarettes, chewing tobacco, and e-cigarettes. If you need help quitting, ask your health care provider. ? Eat a healthy diet. ? Practice  stress-management techniques when you get stressed. ? Stay social.  Drink enough fluid to keep your urine clear or pale yellow.  Make sure to get quality sleep. These tips can help you to get a good night's rest: ? Avoid napping during the day. ?  Keep your sleeping area dark and cool. ? Avoid exercising during the few hours before you go to bed. ? Avoid caffeine products in the evening. General instructions  Work with your health care provider to determine what you need help with and what your safety needs are.  If you were given a bracelet that tracks your location, make sure to wear it.  Keep all follow-up visits as told by your health care provider. This is important. Contact a health care provider if:  You have any new symptoms.  You have problems with choking or swallowing.  You have any symptoms of a different illness. Get help right away if:  You develop a fever.  You have new or worsening confusion.  You have new or worsening sleepiness.  You have a hard time staying awake.  You or your family members become concerned for your safety. This information is not intended to replace advice given to you by your health care provider. Make sure you discuss any questions you have with your health care provider. Document Released: 01/26/2001 Document Revised: 12/11/2015 Document Reviewed: 04/30/2015 Elsevier Interactive Patient Education  2017 [employer].   This information is not intended to replace advice given to you by your health care provider. Make sure you discuss any questions you have with your health care provider. Document Released: 05/12/2005 Document Revised: 04/05/2016 Document Reviewed: 11/07/2013 Elsevier Interactive Patient Education  2017 [employer].

## 2017-05-04 NOTE — Progress Notes (Signed)
PROGRESS NOTE  Subjective: Leah Figueroa a 83 y.o.femalewith a history of right CVA, HTN, hypothyroidism, breast CA, arthritis and iron deficiency who presented to the ED 9/17 for acute onset right sided weakness and slurred speech, though these deficits resolved on arrival to the ED. She was brought in for observation for TIA work up which was unrevealing. She was treated with ceftriaxone for Klebsiella UTI and was ready for discharge today until she began having emesis. This worsened with motion, and with a history of vertigo, meclizine was started. She will be kept overnight for further work up and treatment.    Says she doesn't feel good, though history is further limited by dementia. Mental status is at baseline per daughter.   Objective: BP 119/71   Pulse (!) 107   Temp 97.9 F (36.6 C) (Oral)   Resp 16   Ht  (1.6 m)   Wt 45.3 kg (99 lb 13.9 oz)   SpO2 (!) 87%   BMI 17.69 kg/m   Gen: During 2 separate exams, was well-appearing in no distress Pulm: Clear and nonlabored on room air  CV: RRR, no murmur, no JVD, no edema GI: Soft, no grimaces, does not report tenderness. No distention +BS.  Neuro: Alert, oriented x1. Speech normal. HOH and left visual field cut noted, otherwise CN's intact.  Skin: No rashes, lesions, or ulcers  Assessment & Plan: TIA: Right-sided weakness resolved, speech at baseline. Chronic left hemianopia and hemisensory changes from right MCA CVA, stable. MRI negative for acute infarct. Carotid duplex with insignificant stenosis. Echo without CES. HbA1c 5%.  - Consider augmenting lipitor  due to LDL 77, though HDL is 40.  - Antiplatelet per neurology: Continue plavix. - DC to home with supportive environment. 24-hr care by daughter and night tech. - 3 in 1 and shower tub/chair ordered.  - Discussed expected progression of dementia with daughter, information given in writing as well.   Intractable nausea and vomiting: History limited by dementia,  so unclear amount of abdominal pain. Labs have been unremarkable. Seems to have vertiginous component (has h/o vertigo) - Trial meclizine - Continue antiemetics - Restart IV for overnight hydration - Step back to clear liquid diet - CT abd/pelvis with po and IV contrast if possible - Repeat AM labs.  K. oxytoca UTI: Likely provocative factor in resolved deficits. Unsure relationship to emesis. - Continue antibiotics: CTX > keflex pending culture results.  Essential HTN: Chronic, stable - Continue metoprolol, norvasc.  Hypothyroidism: Chronic, stable.  - Continue synthroid.  Iron deficiency anemia: Hgb wnl.  - Continue po iron.   Leah Junker, MD [facility] Pager [phone] 05/04/2017, 6:22 PM

## 2017-05-05 ENCOUNTER — Inpatient Hospital Stay (HOSPITAL_COMMUNITY): Payer: Medicare Other

## 2017-05-05 LAB — COMPREHENSIVE METABOLIC PANEL
ALBUMIN: 3.7 g/dL (ref 3.5–5.0)
ALK PHOS: 60 U/L (ref 38–126)
ALT: 16 U/L (ref 14–54)
AST: 26 U/L (ref 15–41)
Anion gap: 10 (ref 5–15)
BILIRUBIN TOTAL: 0.9 mg/dL (ref 0.3–1.2)
BUN: 9 mg/dL (ref 6–20)
CALCIUM: 9.8 mg/dL (ref 8.9–10.3)
CO2: 27 mmol/L (ref 22–32)
Chloride: 100 mmol/L — ABNORMAL LOW (ref 101–111)
Creatinine, Ser: 0.58 mg/dL (ref 0.44–1.00)
GFR calc Af Amer: >60 mL/min (ref >60)
GFR calc non Af Amer: >60 mL/min (ref >60)
Glucose, Bld: 118 mg/dL — ABNORMAL HIGH (ref 65–99)
Potassium: 3.6 mmol/L (ref 3.5–5.1)
SODIUM: 137 mmol/L (ref 135–145)
TOTAL PROTEIN: 6.9 g/dL (ref 6.5–8.1)

## 2017-05-05 LAB — CBC
HCT: 41.4 % (ref 36.0–46.0)
Hemoglobin: 14.5 g/dL (ref 12.0–15.0)
MCH: 32.3 pg (ref 26.0–34.0)
MCHC: 35 g/dL (ref 30.0–36.0)
MCV: 92.2 fL (ref 78.0–100.0)
Platelets: 206 10*3/uL (ref 150–400)
RBC: 4.49 MIL/uL (ref 3.87–5.11)
RDW: 12.3 % (ref 11.5–15.5)
WBC: 8 10*3/uL (ref 4.0–10.5)

## 2017-05-05 LAB — URINE CULTURE: Culture: >=100000 — AB

## 2017-05-05 MED ORDER — IOPAMIDOL (ISOVUE-300) INJECTION 61%
INTRAVENOUS | Status: AC
  Filled 2017-05-05: qty 30

## 2017-05-05 MED ORDER — MECLIZINE HCL 25 MG PO TABS: 25.0000 mg | ORAL_TABLET | Freq: Three times a day (TID) | ORAL | 0 refills | Status: DC

## 2017-05-05 MED ORDER — IOPAMIDOL (ISOVUE-300) INJECTION 61%
INTRAVENOUS | Status: AC
  Administered 2017-05-05: 100 mL
  Filled 2017-05-05: qty 100

## 2017-05-05 NOTE — Progress Notes (Signed)
Discharge in process. IV removed, care plan and education resolved. When attempting to transport patient to wheelchair, pt became dizzy, vertiguous and began to vomit. MD paged. Dr. Jarvis Newcomer decided to keep patient overnight, start IVF and do ABD CT.

## 2017-05-05 NOTE — Progress Notes (Signed)
Patient refuses to drink contrast. Confused, unable to reason or educate patient. Made CT aware and also notified K. Schoor with [facility]. Will continue to monitor.

## 2017-05-05 NOTE — Progress Notes (Signed)
Telemetry box applied to patient. Box #13 called to central and verified. Patient is pleasantly confused. Bed alarm on. Will monitor.

## 2017-05-05 NOTE — Consult Note (Signed)
[facility] CM Primary Care Navigator  05/05/2017  Leah Figueroa 18-Dec-1922 [id number]   Met with patient and daughter(Leah Figueroa) at the bedside to identify possible discharge needs.  Daughterreports that patient was having disorientation/ confusion, dizziness and almost falling thathad led to this admission.  Patient confirmed thatprimary care provider is Dr. Lajean Manes with  [facility] at [facility]. Daughter shared [facility] [address] obtain medications without any problem. Per patient, daughter has beenmanaging medications for her at home using "pill box" system filled weekly. Daughter reports that she drives and providestransportation for patient to herdoctor's appointments.  Patient lives with daughter who serves at the primary care provider. Patient's niece Leah Figueroa) is also able to assist with care needswhen needed. Daughter mentioned that she also hires caretaker for patient- usually comes every other night.  Anticipated discharge plan is home with outpatient therapy.  Patient and daughterexpressed understanding to follow-up with primary care provider when she getshome, for a post discharge follow-up appointment within a week or sooner if needed. Patient letter (with PCP's contact number) was provided as a reminder. Daughter mentioned that patient is scheduled to see primary care provider on Wednesday 9/26.  Explained to patient and daughter about [facility] CM services available for health management at home but both denied any current needs or concerns at this time, but verbally agreed and opted for EMMI calls tofollow-up as she recovers.   Referral was made to [place] calls after discharge.  Patient and daughter voicedunderstandingto seekreferral from primary care provider to [facility] care management if deemed necessaryfor further services in the future.  [facility] care management information provided for future needs that  may arise.   For questions, please contact:  Dannielle Huh, BSN, RN- [facility] Primary Care Navigator  Telephone: [phone] [place]

## 2017-05-11 DIAGNOSIS — R42 Dizziness and giddiness: Secondary | ICD-10-CM | POA: Diagnosis not present

## 2017-05-11 DIAGNOSIS — J300 Vasomotor rhinitis: Secondary | ICD-10-CM | POA: Diagnosis not present

## 2017-05-11 DIAGNOSIS — N3000 Acute cystitis without hematuria: Secondary | ICD-10-CM | POA: Diagnosis not present

## 2017-05-11 DIAGNOSIS — E441 Mild protein-calorie malnutrition: Secondary | ICD-10-CM | POA: Diagnosis not present

## 2017-05-11 DIAGNOSIS — I10 Essential (primary) hypertension: Secondary | ICD-10-CM | POA: Diagnosis not present

## 2017-05-15 ENCOUNTER — Encounter (HOSPITAL_COMMUNITY): Payer: Self-pay | Admitting: Emergency Medicine

## 2017-05-15 ENCOUNTER — Inpatient Hospital Stay (HOSPITAL_COMMUNITY)
Admission: EM | Admit: 2017-05-15 | Discharge: 2017-05-17 | DRG: 379 | Disposition: A | Discharge status: Home Health | Payer: Medicare Other | Attending: Internal Medicine | Admitting: Internal Medicine

## 2017-05-15 DIAGNOSIS — I4891 Unspecified atrial fibrillation: Secondary | ICD-10-CM | POA: Diagnosis present

## 2017-05-15 DIAGNOSIS — K5900 Constipation, unspecified: Secondary | ICD-10-CM | POA: Diagnosis not present

## 2017-05-15 DIAGNOSIS — Z853 Personal history of malignant neoplasm of breast: Secondary | ICD-10-CM

## 2017-05-15 DIAGNOSIS — E039 Hypothyroidism, unspecified: Secondary | ICD-10-CM | POA: Diagnosis present

## 2017-05-15 DIAGNOSIS — I2510 Atherosclerotic heart disease of native coronary artery without angina pectoris: Secondary | ICD-10-CM | POA: Diagnosis present

## 2017-05-15 DIAGNOSIS — K921 Melena: Secondary | ICD-10-CM | POA: Diagnosis not present

## 2017-05-15 DIAGNOSIS — K922 Gastrointestinal hemorrhage, unspecified: Secondary | ICD-10-CM | POA: Diagnosis not present

## 2017-05-15 DIAGNOSIS — K649 Unspecified hemorrhoids: Secondary | ICD-10-CM | POA: Diagnosis present

## 2017-05-15 DIAGNOSIS — I10 Essential (primary) hypertension: Secondary | ICD-10-CM | POA: Diagnosis not present

## 2017-05-15 DIAGNOSIS — Z66 Do not resuscitate: Secondary | ICD-10-CM | POA: Diagnosis present

## 2017-05-15 DIAGNOSIS — K5641 Fecal impaction: Secondary | ICD-10-CM | POA: Diagnosis present

## 2017-05-15 DIAGNOSIS — Z803 Family history of malignant neoplasm of breast: Secondary | ICD-10-CM

## 2017-05-15 DIAGNOSIS — I252 Old myocardial infarction: Secondary | ICD-10-CM

## 2017-05-15 DIAGNOSIS — R630 Anorexia: Secondary | ICD-10-CM | POA: Diagnosis present

## 2017-05-15 DIAGNOSIS — Z9049 Acquired absence of other specified parts of digestive tract: Secondary | ICD-10-CM

## 2017-05-15 DIAGNOSIS — Z800 Family history of malignant neoplasm of digestive organs: Secondary | ICD-10-CM

## 2017-05-15 DIAGNOSIS — K5731 Diverticulosis of large intestine without perforation or abscess with bleeding: Secondary | ICD-10-CM | POA: Diagnosis not present

## 2017-05-15 DIAGNOSIS — Z79899 Other long term (current) drug therapy: Secondary | ICD-10-CM

## 2017-05-15 DIAGNOSIS — Z8249 Family history of ischemic heart disease and other diseases of the circulatory system: Secondary | ICD-10-CM

## 2017-05-15 DIAGNOSIS — M1990 Unspecified osteoarthritis, unspecified site: Secondary | ICD-10-CM | POA: Diagnosis present

## 2017-05-15 DIAGNOSIS — Z8673 Personal history of transient ischemic attack (TIA), and cerebral infarction without residual deficits: Secondary | ICD-10-CM

## 2017-05-15 DIAGNOSIS — Z7902 Long term (current) use of antithrombotics/antiplatelets: Secondary | ICD-10-CM

## 2017-05-15 DIAGNOSIS — E876 Hypokalemia: Secondary | ICD-10-CM | POA: Diagnosis present

## 2017-05-15 DIAGNOSIS — I4940 Unspecified premature depolarization: Secondary | ICD-10-CM | POA: Diagnosis present

## 2017-05-15 DIAGNOSIS — K625 Hemorrhage of anus and rectum: Secondary | ICD-10-CM | POA: Diagnosis not present

## 2017-05-15 DIAGNOSIS — F0390 Unspecified dementia without behavioral disturbance: Secondary | ICD-10-CM | POA: Diagnosis present

## 2017-05-15 DIAGNOSIS — E785 Hyperlipidemia, unspecified: Secondary | ICD-10-CM | POA: Diagnosis present

## 2017-05-15 LAB — CBC WITH DIFFERENTIAL/PLATELET
BASOS ABS: 0 10*3/uL (ref 0.0–0.1)
BASOS PCT: 0 %
Basophils Absolute: 0 10*3/uL (ref 0.0–0.1)
Basophils Relative: 1 %
EOS ABS: 0.1 10*3/uL (ref 0.0–0.7)
EOS PCT: 2 %
EOS PCT: 1 %
Eosinophils Absolute: 0.1 10*3/uL (ref 0.0–0.7)
HCT: 37.9 % (ref 36.0–46.0)
HCT: 39.1 % (ref 36.0–46.0)
HEMOGLOBIN: 13.1 g/dL (ref 12.0–15.0)
Hemoglobin: 12.5 g/dL (ref 12.0–15.0)
LYMPHS ABS: 1.6 10*3/uL (ref 0.7–4.0)
LYMPHS ABS: 1.6 10*3/uL (ref 0.7–4.0)
LYMPHS PCT: 26 %
Lymphocytes Relative: 22 %
MCH: 31.3 pg (ref 26.0–34.0)
MCH: 31.6 pg (ref 26.0–34.0)
MCHC: 33 g/dL (ref 30.0–36.0)
MCHC: 33.5 g/dL (ref 30.0–36.0)
MCV: 94.8 fL (ref 78.0–100.0)
MCV: 94.4 fL (ref 78.0–100.0)
MONO ABS: 0.7 10*3/uL (ref 0.1–1.0)
Monocytes Absolute: 0.8 10*3/uL (ref 0.1–1.0)
Monocytes Relative: 11 %
Monocytes Relative: 11 %
NEUTROS PCT: 66 %
Neutro Abs: 3.8 10*3/uL (ref 1.7–7.7)
Neutro Abs: 4.7 10*3/uL (ref 1.7–7.7)
Neutrophils Relative %: 60 %
PLATELETS: 232 10*3/uL (ref 150–400)
PLATELETS: 221 10*3/uL (ref 150–400)
RBC: 4 MIL/uL (ref 3.87–5.11)
RBC: 4.14 MIL/uL (ref 3.87–5.11)
RDW: 12.6 % (ref 11.5–15.5)
RDW: 12.6 % (ref 11.5–15.5)
WBC: 6.2 10*3/uL (ref 4.0–10.5)
WBC: 7.2 10*3/uL (ref 4.0–10.5)

## 2017-05-15 LAB — IRON AND TIBC
IRON: 72 ug/dL (ref 28–170)
Saturation Ratios: 31 % (ref 10.4–31.8)
TIBC: 235 ug/dL — AB (ref 250–450)
UIBC: 163 ug/dL

## 2017-05-15 LAB — COMPREHENSIVE METABOLIC PANEL
ALBUMIN: 3.3 g/dL — AB (ref 3.5–5.0)
ALT: 18 U/L (ref 14–54)
AST: 29 U/L (ref 15–41)
Alkaline Phosphatase: 52 U/L (ref 38–126)
Anion gap: 6 (ref 5–15)
BUN: 11 mg/dL (ref 6–20)
CHLORIDE: 108 mmol/L (ref 101–111)
CO2: 25 mmol/L (ref 22–32)
CREATININE: 0.53 mg/dL (ref 0.44–1.00)
Calcium: 9 mg/dL (ref 8.9–10.3)
GFR calc Af Amer: >60 mL/min (ref >60)
GFR calc non Af Amer: >60 mL/min (ref >60)
Glucose, Bld: 80 mg/dL (ref 65–99)
POTASSIUM: 3.5 mmol/L (ref 3.5–5.1)
Sodium: 139 mmol/L (ref 135–145)
Total Bilirubin: 0.7 mg/dL (ref 0.3–1.2)
Total Protein: 6.1 g/dL — ABNORMAL LOW (ref 6.5–8.1)

## 2017-05-15 LAB — POC OCCULT BLOOD, ED: FECAL OCCULT BLD: POSITIVE — AB

## 2017-05-15 LAB — RETICULOCYTES
RBC.: 4.26 MIL/uL (ref 3.87–5.11)
RETIC COUNT ABSOLUTE: 38.3 10*3/uL (ref 19.0–186.0)
RETIC CT PCT: 0.9 % (ref 0.4–3.1)

## 2017-05-15 LAB — VITAMIN B12: VITAMIN B 12: 903 pg/mL (ref 180–914)

## 2017-05-15 LAB — I-STAT TROPONIN, ED: Troponin i, poc: 0 ng/mL (ref 0.00–0.08)

## 2017-05-15 LAB — FOLATE: Folate: 15.5 ng/mL (ref >5.9)

## 2017-05-15 LAB — FERRITIN: FERRITIN: 139 ng/mL (ref 11–307)

## 2017-05-15 LAB — TSH: TSH: 2.245 u[IU]/mL (ref 0.350–4.500)

## 2017-05-15 MED ORDER — ACETAMINOPHEN 650 MG RE SUPP: 650.0000 mg | Freq: Four times a day (QID) | RECTAL | Status: DC | PRN

## 2017-05-15 MED ORDER — ALPRAZOLAM 0.25 MG PO TABS
0.2500 mg | ORAL_TABLET | Freq: Every day | ORAL | Status: DC
  Administered 2017-05-16 – 2017-05-17 (×2): 0.25 mg via ORAL
  Filled 2017-05-15 (×2): qty 1

## 2017-05-15 MED ORDER — SODIUM CHLORIDE 0.9% FLUSH
3.0000 mL | Freq: Two times a day (BID) | INTRAVENOUS | Status: DC
  Administered 2017-05-15 – 2017-05-17 (×3): 3 mL via INTRAVENOUS

## 2017-05-15 MED ORDER — MECLIZINE HCL 25 MG PO TABS
25.0000 mg | ORAL_TABLET | Freq: Three times a day (TID) | ORAL | Status: DC
Start: 2017-05-15 — End: 2017-05-17
  Administered 2017-05-15 – 2017-05-17 (×6): 25 mg via ORAL
  Filled 2017-05-15 (×6): qty 1

## 2017-05-15 MED ORDER — CLOPIDOGREL BISULFATE 75 MG PO TABS
75.0000 mg | ORAL_TABLET | Freq: Every day | ORAL | Status: DC
  Administered 2017-05-16: 75 mg via ORAL
  Filled 2017-05-15: qty 1

## 2017-05-15 MED ORDER — DOCUSATE SODIUM 100 MG PO CAPS
100.0000 mg | ORAL_CAPSULE | Freq: Two times a day (BID) | ORAL | Status: DC
  Administered 2017-05-15 – 2017-05-17 (×5): 100 mg via ORAL
  Filled 2017-05-15 (×5): qty 1

## 2017-05-15 MED ORDER — ACETAMINOPHEN 325 MG PO TABS
650.0000 mg | ORAL_TABLET | Freq: Four times a day (QID) | ORAL | Status: DC | PRN
  Administered 2017-05-16 – 2017-05-17 (×2): 650 mg via ORAL
  Filled 2017-05-15 (×2): qty 2

## 2017-05-15 MED ORDER — POLYETHYLENE GLYCOL 3350 17 G PO PACK
17.0000 g | PACK | Freq: Every day | ORAL | Status: DC
  Administered 2017-05-15 – 2017-05-16 (×2): 17 g via ORAL
  Filled 2017-05-15 (×2): qty 1

## 2017-05-15 MED ORDER — LEVOTHYROXINE SODIUM 25 MCG PO TABS
25.0000 ug | ORAL_TABLET | Freq: Every day | ORAL | Status: DC
  Administered 2017-05-16 – 2017-05-17 (×2): 25 ug via ORAL
  Filled 2017-05-15 (×2): qty 1

## 2017-05-15 MED ORDER — MAGNESIUM HYDROXIDE 400 MG/5ML PO SUSP
30.0000 mL | Freq: Once | ORAL | Status: AC
  Administered 2017-05-15: 30 mL via ORAL
  Filled 2017-05-15: qty 30

## 2017-05-15 MED ORDER — IPRATROPIUM BROMIDE 0.06 % NA SOLN
2.0000 | Freq: Three times a day (TID) | NASAL | Status: DC
  Administered 2017-05-16 – 2017-05-17 (×7): 2 via NASAL
  Filled 2017-05-15: qty 30

## 2017-05-15 MED ORDER — SODIUM CHLORIDE 0.9 % IV SOLN
INTRAVENOUS | Status: DC
  Administered 2017-05-15 – 2017-05-17 (×5): via INTRAVENOUS

## 2017-05-15 NOTE — ED Notes (Signed)
Admitting at bedside 

## 2017-05-15 NOTE — ED Notes (Signed)
Pt dinner tray arrived.  

## 2017-05-15 NOTE — ED Notes (Signed)
EDP at bedside attempting to dis impact pt

## 2017-05-15 NOTE — ED Notes (Signed)
Pt placed on bedpan. Will place purewick when pt is ready to come off bedpan. Pt agreeable with this plan.

## 2017-05-15 NOTE — ED Notes (Signed)
Family at bedside. 

## 2017-05-15 NOTE — H&P (Signed)
History and Physical    Leah Figueroa ZOX:[id number] DOB: April 04, 1923 DOA: 05/15/2017   PCP: Merlene Laughter, MD   Attending physician: [place]  Patient coming from/Resides with: Private residence/alone but daughter checks on her frequently  Chief Complaint: Mild hematochezia  HPI: Leah Figueroa is a 83 y.o. female with medical history significant for CAD, hypertension, hypothyroidism and dyslipidemia. Patient recently discharged on 9/19 after hospital admission for altered mental status found to be secondary to Klebsiella UTI. She has completed oral Keflex. Patient returns to ED today daughter reporting one week of constipation. Patient was given laxative overnight but has had no success in passing bowel movement. Home health nurse was assisting patient to toilet and noticed after attempts to pass bowel movement patient had "red clumps" of blood in the toilet. Patient was asymptomatic without nausea, vomiting, abdominal pain. CT abdomen and pelvis performed on previous admission on 9/20 revealed pancolonic diverticula. The physician assistant to examine the patient performed a rectal exam and noted significant amount of stool in the rectal vault as well as hemorrhoids and attempted to break up the impaction but was unsuccessful.  ED Course:  Vital Signs: BP 130/89   Pulse 93   Resp (!) 22   Ht  (1.6 m)   Wt 44.9 kg (99 lb)   SpO2 96%   BMI 17.54 kg/m  Lab data: Sodium 139, potassium 3.5, chloride 108, CO2 25, glucose, BUN 11, creatinine 0.53, LFTs not elevated, albumin 3.3 with total protein 6.1, white count 6200 with normal differential, hemoglobin 12.5, platelets 232,000, FOB positive. Medications and treatments: None  Review of Systems:  In addition to the HPI above,  No Fever-chills, myalgias or other constitutional symptoms No Headache, changes with Vision or hearing, new weakness, tingling, numbness in any extremity, dizziness, dysarthria or word finding difficulty, gait  disturbance or imbalance, tremors or seizure activity No problems swallowing food or Liquids, indigestion/reflux, choking or coughing while eating, abdominal pain with or after eating No Chest pain, Cough or Shortness of Breath, palpitations, orthopnea or DOE No Abdominal pain, N/V, melena, dark tarry stools No dysuria, malodorous urine, hematuria or flank pain No new skin rashes, lesions, masses or bruises, No new joint pains, aches, swelling or redness No recent unintentional weight gain or loss No polyuria, polydypsia or polyphagia   Past Medical History:  Diagnosis Date  . Acute myocardial infarction of other anterior wall, episode of care unspecified   . Arthritis   . Cancer (HCC)    hx of breast lumpectomy   . Coronary artery disease   . Cramp of limb   . Other and unspecified hyperlipidemia   . Other specified personal history presenting hazards to health(V15.89)   . Personal history of arthritis   . Personal history of malignant neoplasm of breast   . Personal history of other diseases of digestive system   . Stroke [facility]) 03/2015   "left sided issues"  . Unspecified essential hypertension   . Unspecified hypothyroidism   . Unspecified personal history presenting hazards to health     Past Surgical History:  Procedure Laterality Date  . BACK SURGERY    . CATARACT EXTRACTION     left-2006, right 2011  . CHOLECYSTECTOMY    . intrathoracic goiter resection    . lumpectomy for breast cancer Left 80's   radiation therapy  . right thyroid lobectomy      Social History   Social History  . Marital status: Widowed    Spouse name:  N/A  . Number of children: 1  . Years of education: 7   Occupational History  . retired    Social History Main Topics  . Smoking status: Never Smoker  . Smokeless tobacco: Never Used  . Alcohol use No  . Drug use: No  . Sexual activity: No   Other Topics Concern  . Not on file   Social History Narrative   HSG. Married '48- 57  yrs/widowed. 1 daughter - '59; 1 grandchild. 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  Lives alone. I-ADLs, manages books, pays bills.. End-of-Life Care: no heroic measures; No CPR, no mechanical ventilation.  Caffeine use/day:  None. Does Patient Exercise:  No       03/07/17 lives with daughter, [name]       Mobility: RW Work history: Not obtained   No Known Allergies  Family History  Problem Relation Age of Onset  . Colon cancer Mother   . Breast cancer Mother   . Coronary artery disease Brother   . Dementia Sister      Prior to Admission medications   Medication Sig Start Date End Date Taking? Authorizing Provider  acetaminophen (TYLENOL) 325 MG tablet Take 2 tablets (650 mg total) by mouth every 4 (four) hours as needed for mild pain. Patient taking differently: Take 325 mg by mouth at bedtime as needed for mild pain.  04/07/15  Yes Love, Evlyn Kanner, PA-C  ALPRAZolam (XANAX) 0.25 MG tablet TAKE 0.25 MG BY MOUTH AT dinner 08/31/16  Yes [provider]  amLODipine (NORVASC) 5 MG tablet Take 5 mg by mouth daily. 08/23/15  Yes [provider]  atorvastatin (LIPITOR) 10 MG tablet Take 10 mg by mouth daily at 6 PM.    Yes [provider]  clopidogrel (PLAVIX) 75 MG tablet Take 1 tablet (75 mg total) by mouth daily. 04/07/15  Yes Love, Evlyn Kanner, PA-C  docusate sodium (COLACE) 100 MG capsule Take 100 mg by mouth at bedtime as needed for mild constipation.   Yes [provider]  ferrous sulfate 325 (65 FE) MG tablet Take 325 mg by mouth See admin instructions. Take 1 tablet daily for 7 days then stop for 7 days.  Repeat cycle   Yes [provider]  ipratropium (ATROVENT) 0.03 % nasal spray Place 2 sprays into both nostrils 3 (three) times daily as needed. 05/11/17  Yes [provider]  levothyroxine (SYNTHROID, LEVOTHROID) 25 MCG tablet Take 1 tablet by mouth  daily Patient taking differently: Take 25 mcg by mouth daily 12/25/14  Yes Plotnikov,  Georgina Quint, MD  Melatonin 10 MG TABS Take 10 mg by mouth at bedtime.    Yes [provider]  metoprolol (LOPRESSOR) 50 MG tablet Take 50 mg by mouth 2 (two) times daily. 08/30/15  Yes [provider]  ondansetron (ZOFRAN) 8 MG tablet Take 1 tablet (8 mg total) by mouth every 8 (eight) hours as needed for nausea or vomiting. 05/04/17  Yes Tyrone Nine, MD  cephALEXin (KEFLEX) 500 MG capsule Take 1 capsule (500 mg total) by mouth 2 (two) times daily. 05/04/17   Tyrone Nine, MD  Cholecalciferol (VITAMIN D3) 5000 units CAPS Take 5,000 Units by mouth daily.     [provider]  meclizine (ANTIVERT) 25 MG tablet Take 1 tablet (25 mg total) by mouth 3 (three) times daily. 05/05/17   Tyrone Nine, MD  vitamin B-12 (CYANOCOBALAMIN) 1000 MCG tablet Take 1,000 mcg by mouth daily.     [provider]    Physical Exam: Vitals:   05/15/17 1345 05/15/17 1415 05/15/17 1430 05/15/17 1515  BP: (!) 131/92 127/71 129/82 130/89  Pulse: 91 75 92 93  Resp:  19 14 (!) 22  SpO2: 97% 96% 96% 96%  Weight:      Height:          Constitutional: NAD, calm, comfortable Eyes: PERRL, lids and conjunctivae normal ENMT: Mucous membranes are moist. Posterior pharynx clear of any exudate or lesions.Normal dentition.  Neck: normal, supple, no masses, no thyromegaly Respiratory: clear to auscultation bilaterally, no wheezing, no crackles. Normal respiratory effort. No accessory muscle use.  Cardiovascular: Regular rate and rhythm, no murmurs / rubs / gallops. No extremity edema. 2+ pedal pulses. No carotid bruits.  Abdomen: no tenderness, no masses palpated. No hepatosplenomegaly. Bowel sounds positive.  Musculoskeletal: no clubbing / cyanosis. No joint deformity upper and lower extremities. Good ROM, no contractures. Normal muscle tone.  Skin: no rashes, lesions, ulcers. No induration Neurologic: CN 2-12 grossly intact. Sensation intact, DTR normal. Strength 5/5 x all 4 extremities.    Psychiatric: Alert and oriented x name only. Normal mood.    Labs on Admission: I have personally reviewed following labs and imaging studies  CBC:  Recent Labs Lab 05/15/17 1208  WBC 6.2  NEUTROABS 3.8  HGB 12.5  HCT 37.9  MCV 94.8  PLT 232   Basic Metabolic Panel:  Recent Labs Lab 05/15/17 1208  NA 139  K 3.5  CL 108  CO2 25  GLUCOSE 80  BUN 11  CREATININE 0.53  CALCIUM 9.0   GFR: Estimated Creatinine Clearance: 31.8 mL/min (by C-G formula based on SCr of 0.53 mg/dL). Liver Function Tests:  Recent Labs Lab 05/15/17 1208  AST 29  ALT 18  ALKPHOS 52  BILITOT 0.7  PROT 6.1*  ALBUMIN 3.3*   No results for input(s): LIPASE, AMYLASE in the last 168 hours. No results for input(s): AMMONIA in the last 168 hours. Coagulation Profile: No results for input(s): INR, PROTIME in the last 168 hours. Cardiac Enzymes: No results for input(s): CKTOTAL, CKMB, CKMBINDEX, TROPONINI in the last 168 hours. BNP (last 3 results) No results for input(s): PROBNP in the last 8760 hours. HbA1C: No results for input(s): HGBA1C in the last 72 hours. CBG: No results for input(s): GLUCAP in the last 168 hours. Lipid Profile: No results for input(s): CHOL, HDL, LDLCALC, TRIG, CHOLHDL, LDLDIRECT in the last 72 hours. Thyroid Function Tests: No results for input(s): TSH, T4TOTAL, FREET4, T3FREE, THYROIDAB in the last 72 hours. Anemia Panel: No results for input(s): VITAMINB12, FOLATE, FERRITIN, TIBC, IRON, RETICCTPCT in the last 72 hours. Urine analysis:    Component Value Date/Time   COLORURINE YELLOW 05/02/2017 2042   APPEARANCEUR HAZY (A) 05/02/2017 2042   LABSPEC 1.012 05/02/2017 2042   PHURINE 5.0 05/02/2017 2042   GLUCOSEU NEGATIVE 05/02/2017 2042   HGBUR SMALL (A) 05/02/2017 2042   BILIRUBINUR NEGATIVE 05/02/2017 2042   KETONESUR NEGATIVE 05/02/2017 2042   PROTEINUR NEGATIVE 05/02/2017 2042   UROBILINOGEN 1.0 03/23/2015 0257   NITRITE NEGATIVE 05/02/2017 2042    LEUKOCYTESUR LARGE (A) 05/02/2017 2042   Sepsis Labs: (procalcitonin:4,lacticidven:4) )No results found for this or any previous visit (from the past 240 hour(s)).   Radiological Exams on Admission: No results found.  EKG: (Independently reviewed) Sinus rhythm with sinus arrhythmia versus PACs, ventricular rate 66 bpm, QTC 442 ms, no acute ischemic changes, normal R-wave rotation  Assessment/Plan Principal Problem:   Hematochezia -Patient  presents with one episode of bright red blood per rectum (painless )  seen on toilet paper and in toilet after reported 1 week constipation -On exam was noted to have stool in rectal vault and hemorrhoids -Recent CT with pancolonic diverticula -Hemodynamically stable with stable hemoglobin -GI consultation pending -Focus on disimpaction: MOM 30 mL 1, Colace 100 twice a day, MiraLAX 17 g at at bedtime -Hydrate with normal saline at 100/hr -Monitor for recurrent bleeding  Active Problems:   Coronary artery disease -Currently asymptomatic -We'll continue Plavix for now but if bleeding recurs we'll need to stop -Hold statin -Old beta blocker in setting of suboptimal blood pressure readings and concerns over active GI bleeding    HTN (hypertension) -Blood pressure suboptimal so holding Norvasc and Lopressor    Other and unspecified hyperlipidemia -Hold statin until diet advanced to solid    Hypothyroidism -Continue Synthroid -Check TSH in setting of constipation      DVT prophylaxis: SCDs Code Status: DO NOT RESUSCITATE  Family Communication: No family at bedside  Disposition Plan: Home Consults called: Gastroenterology to be consulted by EDP     Ivett Luebbe L. ANP-BC [facility] Pager [phone]   If 7PM-7AM, please contact night-coverage [email or web address] Password [email or web address]  05/15/2017, 3:35 PM

## 2017-05-15 NOTE — ED Triage Notes (Signed)
Per EMS, patient coming from home with constipation x 1 week.  Daughter gave patient a laxative last night and when patient tried to have a BM this AM, in home nurse saw red clumps of blood in toilet.  Hx of chronic afib, vertigo, TIA, stroke 2016, hypertension.  12 lead unremarkable.  A/O x 4.  134/60, RR 18, 97% RA, No hx of DM.

## 2017-05-15 NOTE — ED Provider Notes (Signed)
[facility]-EMERGENCY DEPT Provider Note   CSN: [id number] Arrival date & time: 05/15/17  1114     History   Chief Complaint Chief Complaint  Patient presents with  . Constipation  . Rectal Bleeding    HPI Leah Figueroa is a 83 y.o. female who presents with constipation and rectal bleeding. Daughter reports the patient has been constipated for the last 4-5 days. Patient daughter is unsure of date of last bowel movement but thinks that his been approximate 4-5 days. Patient's daughter reports that last night, they gave her a laxative to help her symptoms. This morning, patient went to go have a bowel movement but daughter reports that no stool was present. Daughter reports that she visualized large clumps of red blood with black spots on it. Patient had some associated rectal bleeding, prompting EMS call and concern. Patient reports some mild lower abdominal pain but is unsure when it started. Daughter denies any vomiting. Daughter does report the patient has been overall weak and tired lately, which is abnormal for her. Patient was recently admitted to the hospital on 05/03/17 for TIA workup. She was discharged on 05/05/17. Heart denies any recent fever, chills, vomiting, diarrhea. Denies any history of ulcers. Patient daughter reports that she does occasionally take Tylenol but denies any other ibuprofen, Advil, Motrin. Patient is on plavix but no other blood thinner.   The history is provided by the patient and a relative.    Past Medical History:  Diagnosis Date  . Acute myocardial infarction of other anterior wall, episode of care unspecified   . Arthritis   . Cancer (HCC)    hx of breast lumpectomy   . Coronary artery disease   . Cramp of limb   . Other and unspecified hyperlipidemia   . Other specified personal history presenting hazards to health(V15.89)   . Personal history of arthritis   . Personal history of malignant neoplasm of breast   . Personal history of other diseases of  digestive system   . Stroke [facility]) 03/2015   "left sided issues"  . Unspecified essential hypertension   . Unspecified hypothyroidism   . Unspecified personal history presenting hazards to health     Patient Active Problem List   Diagnosis Date Noted  . Hematochezia 05/15/2017  . Coronary artery disease 05/15/2017  . Other and unspecified hyperlipidemia 05/15/2017  . Hypothyroidism 05/15/2017  . HTN (hypertension) 05/15/2017  . TIA (transient ischemic attack) 05/02/2017  . History of stroke 03/07/2017  . Gait abnormality 03/07/2017  . Subacromial impingement of left shoulder 02/09/2016  . Hemiparesis affecting left side as late effect of stroke (HCC) 01/20/2016  . Vertigo 09/03/2015  . Dehydration 09/03/2015  . Acute lower UTI 09/03/2015  . Prolonged Q-T interval on ECG 09/03/2015  . Left-sided neglect 07/01/2015  . History of right MCA stroke 07/01/2015  . Altered sensation due to stroke 04/29/2015  . Cognitive deficit, post-stroke 04/29/2015  . Pseudogout of left wrist 03/28/2015  . Hyperlipidemia LDL goal <70 03/26/2015  . Hyperglycemia 03/26/2015  . 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 Embolic stroke (HCC)   . Left hemiplegia (HCC)   . Homonymous hemianopia   . Chest wall trauma 01/11/2014  . Injury of left lower arm 01/11/2014  . Headache(784.0) 01/04/2013  . Allergic rhinitis, cause unspecified 01/04/2013  . Anemia 04/19/2012  . Epistaxis, recurrent 04/19/2012  . Hypothyroidism 01/01/2008  . Essential hypertension 01/01/2008  . HYPERTENSION 01/01/2008  . MYOCARDIAL INFARCTION, ACUTE, ANTERIOR  WALL 01/01/2008  . LEG CRAMPS 01/01/2008  . ADENOCARCINOMA, BREAST, HX OF 01/01/2008  . DIVERTICULOSIS, COLON, HX OF 01/01/2008  . ARTHRITIS, HX OF 01/01/2008  . RADIATION THERAPY, HX OF 01/01/2008  . CATARACT EXTRACTION, HX OF 01/01/2008  . CHOLECYSTECTOMY, LAPAROSCOPIC, HX OF 01/01/2008    Past Surgical History:  Procedure  Laterality Date  . BACK SURGERY    . CATARACT EXTRACTION     left-2006, right 2011  . CHOLECYSTECTOMY    . intrathoracic goiter resection    . lumpectomy for breast cancer Left 80's   radiation therapy  . right thyroid lobectomy      OB History    No data available       Home Medications    Prior to Admission medications   Medication Sig Start Date End Date Taking? Authorizing Provider  acetaminophen (TYLENOL) 325 MG tablet Take 2 tablets (650 mg total) by mouth every 4 (four) hours as needed for mild pain. Patient taking differently: Take 325 mg by mouth at bedtime as needed for mild pain.  04/07/15  Yes Love, Evlyn Kanner, PA-C  ALPRAZolam (XANAX) 0.25 MG tablet TAKE 0.25 MG BY MOUTH AT dinner 08/31/16  Yes [provider]  amLODipine (NORVASC) 5 MG tablet Take 5 mg by mouth daily. 08/23/15  Yes [provider]  atorvastatin (LIPITOR) 10 MG tablet Take 10 mg by mouth daily at 6 PM.    Yes [provider]  clopidogrel (PLAVIX) 75 MG tablet Take 1 tablet (75 mg total) by mouth daily. 04/07/15  Yes Love, Evlyn Kanner, PA-C  docusate sodium (COLACE) 100 MG capsule Take 100 mg by mouth at bedtime as needed for mild constipation.   Yes [provider]  ferrous sulfate 325 (65 FE) MG tablet Take 325 mg by mouth See admin instructions. Take 1 tablet daily for 7 days then stop for 7 days.  Repeat cycle   Yes [provider]  ipratropium (ATROVENT) 0.03 % nasal spray Place 2 sprays into both nostrils 3 (three) times daily as needed. 05/11/17  Yes [provider]  levothyroxine (SYNTHROID, LEVOTHROID) 25 MCG tablet Take 1 tablet by mouth  daily Patient taking differently: Take 25 mcg by mouth daily 12/25/14  Yes Plotnikov, Georgina Quint, MD  Melatonin 10 MG TABS Take 10 mg by mouth at bedtime.    Yes [provider]  metoprolol (LOPRESSOR) 50 MG tablet Take 50 mg by mouth 2 (two) times daily. 08/30/15  Yes [provider]  ondansetron  (ZOFRAN) 8 MG tablet Take 1 tablet (8 mg total) by mouth every 8 (eight) hours as needed for nausea or vomiting. 05/04/17  Yes Tyrone Nine, MD  cephALEXin (KEFLEX) 500 MG capsule Take 1 capsule (500 mg total) by mouth 2 (two) times daily. 05/04/17   Tyrone Nine, MD  Cholecalciferol (VITAMIN D3) 5000 units CAPS Take 5,000 Units by mouth daily.     [provider]  meclizine (ANTIVERT) 25 MG tablet Take 1 tablet (25 mg total) by mouth 3 (three) times daily. 05/05/17   Tyrone Nine, MD  vitamin B-12 (CYANOCOBALAMIN) 1000 MCG tablet Take 1,000 mcg by mouth daily.     [provider]    Family History Family History  Problem Relation Age of Onset  . Colon cancer Mother   . Breast cancer Mother   . Coronary artery disease Brother   . Dementia Sister     Social History Social History  Substance Use Topics  .  Smoking status: Never Smoker  . Smokeless tobacco: Never Used  . Alcohol use No     Allergies   Patient has no known allergies.   Review of Systems Review of Systems  Constitutional: Negative for chills and fever.  Respiratory: Negative for shortness of breath.   Cardiovascular: Negative for chest pain.  Gastrointestinal: Positive for abdominal pain, blood in stool and constipation. Negative for nausea and vomiting.  Genitourinary: Negative for dysuria and hematuria.  Neurological: Negative for dizziness, weakness (Left sided deficits from stroke) and numbness.  Psychiatric/Behavioral: Positive for confusion.     Physical Exam Updated Vital Signs BP 138/80   Pulse (!) 59   Resp 20   Ht  (1.6 m)   Wt 44.9 kg (99 lb)   SpO2 94%   BMI 17.54 kg/m   Physical Exam  Constitutional: She is oriented to person, place, and time. She appears well-developed and well-nourished.  Elderly and frail-appearing  HENT:  Head: Normocephalic and atraumatic.  Mouth/Throat: Oropharynx is clear and moist and mucous membranes are normal.  Eyes: Pupils are equal,  round, and reactive to light. Conjunctivae, EOM and lids are normal.  Neck: Full passive range of motion without pain.  Cardiovascular: Normal rate, regular rhythm, normal heart sounds and normal pulses.  Exam reveals no gallop and no friction rub.   No murmur heard. Pulmonary/Chest: Effort normal and breath sounds normal.  Abdominal: Soft. Normal appearance. She exhibits no distension. Bowel sounds are decreased. There is tenderness in the right lower quadrant, suprapubic area and left lower quadrant. There is no rigidity, no guarding and no tenderness at McBurney's point.  Tenderness palpation to the lower abdomen. Abdomen is soft, nondistended.  Genitourinary: Rectal exam shows no mass and no tenderness.  Genitourinary Comments: The exam was performed with a chaperone present. Dried bright red blood surrounding the anus. Evidence of stool at the rectal vault indicating a fecal impaction There is a small external hemorrhoid noted at the 4:00 region. It appears to be nonthrombosed. No mass felt.  Musculoskeletal: Normal range of motion.  Neurological: She is alert and oriented to person, place, and time.  Skin: Skin is warm and dry. Capillary refill takes less than 2 seconds.  Psychiatric: She has a normal mood and affect. Her speech is normal.  Nursing note and vitals reviewed.    ED Treatments / Results  Labs (all labs ordered are listed, but only abnormal results are displayed) Labs Reviewed  COMPREHENSIVE METABOLIC PANEL - Abnormal; Notable for the following:       Result Value   Total Protein 6.1 (*)    Albumin 3.3 (*)    All other components within normal limits  POC OCCULT BLOOD, ED - Abnormal; Notable for the following:    Fecal Occult Bld POSITIVE (*)    All other components within normal limits  CBC WITH DIFFERENTIAL/PLATELET  CBC WITH DIFFERENTIAL/PLATELET  TSH  RETICULOCYTES  VITAMIN B12  FOLATE  IRON AND TIBC  FERRITIN  I-STAT TROPONIN, ED    EKG  EKG  Interpretation  Date/Time:  Sunday May 15 2017 12:06:33 EDT Ventricular Rate:  66 PR Interval:    QRS Duration: 90 QT Interval:  421 QTC Calculation: 442 R Axis:   54 Text Interpretation:  Atrial fibrillation Probable anterior infarct, age indeterminate Baseline wander in lead(s) I III aVL since last tracing no significant change Confirmed by Rolan Bucco [phone]) on 05/15/2017 12:32:45 PM       Radiology No results found.  Procedures Procedures (including critical care time)  Medications Ordered in ED Medications  0.9 %  sodium chloride infusion ( Intravenous New Bag/Given 05/15/17 1719)  docusate sodium (COLACE) capsule 100 mg (100 mg Oral Given 05/15/17 1720)  polyethylene glycol (MIRALAX / GLYCOLAX) packet 17 g (not administered)  magnesium hydroxide (MILK OF MAGNESIA) suspension 30 mL (30 mLs Oral Given 05/15/17 1719)     Initial Impression / Assessment and Plan / ED Course  I have reviewed the triage vital signs and the nursing notes.  Pertinent labs & imaging results that were available during my care of the patient were reviewed by me and considered in my medical decision making (see chart for details).     83 year old female with past medical history of HTN, TIA,  presented constipation or rectal bleeding that began this morning. Patient has been constant for the last 4 days. Patient is afebrile, non-toxic appearing, sitting comfortably on examination table. Vital signs reviewed and stable. There is mention of patient having chronic eighth rib in the triage note. I do not see any mention of A. fib in patient's past medical history. Consider constipation versus hemorrhoids versus lower GI bleed. History/physical examination are not concerning for mesenteric ischemia. Plan to check basic labs including CBC, CMP, troponin, fecal occult.  Fecal occult positive. CBC reviewed. It does show a 2 g drop from 14-12 last days. Otherwise stable. CMP unremarkable. Re-evaluation.  Patient reports improvement in abdominal pain. Repeat exam shows improvement in tenderness. Will attempt disimpaction.   Attempted manual disimpaction. I was able to get some stool out from the rectal vault. The stool is brown in nature but it was surrounded with bright red blood. Additionally, when the finger was inserted, there was bright red blood surrounding the digit. Given continued bleeding and concern about drop in CBC, likely recommend admission for further follow-up. Plan to consult hospitalist.   Discussed with hospitalist. Will admit. Would like GI to be consulted.   Discussed patient with Dr. Bosie Clos (GI). Patient had seen Dr. Yancey Flemings in 2015 with [facility]. Recommends consulting [facility].   Paged [facility].   Paged [facility].  Discussed with Dr. Russella Dar ([facility]). Aware of patient and will plan to consult on patient during admission.   Final Clinical Impressions(s) / ED Diagnoses   Final diagnoses:  Constipation, unspecified constipation type  Lower GI bleed    New Prescriptions New Prescriptions   No medications on file     Maxwell Caul, PA-C 05/15/17 1900    Maxwell Caul, PA-C 05/15/17 1949    Rolan Bucco, MD 05/18/17 [phone]

## 2017-05-15 NOTE — ED Notes (Signed)
Pt bed linens changed and peri care performed.

## 2017-05-16 ENCOUNTER — Encounter (HOSPITAL_COMMUNITY): Payer: Self-pay | Admitting: Physician Assistant

## 2017-05-16 DIAGNOSIS — K5641 Fecal impaction: Secondary | ICD-10-CM | POA: Diagnosis present

## 2017-05-16 DIAGNOSIS — Z853 Personal history of malignant neoplasm of breast: Secondary | ICD-10-CM | POA: Diagnosis not present

## 2017-05-16 DIAGNOSIS — Z66 Do not resuscitate: Secondary | ICD-10-CM | POA: Diagnosis present

## 2017-05-16 DIAGNOSIS — Z9049 Acquired absence of other specified parts of digestive tract: Secondary | ICD-10-CM | POA: Diagnosis not present

## 2017-05-16 DIAGNOSIS — Z7902 Long term (current) use of antithrombotics/antiplatelets: Secondary | ICD-10-CM

## 2017-05-16 DIAGNOSIS — I252 Old myocardial infarction: Secondary | ICD-10-CM | POA: Diagnosis not present

## 2017-05-16 DIAGNOSIS — Z8249 Family history of ischemic heart disease and other diseases of the circulatory system: Secondary | ICD-10-CM | POA: Diagnosis not present

## 2017-05-16 DIAGNOSIS — K5900 Constipation, unspecified: Secondary | ICD-10-CM | POA: Diagnosis not present

## 2017-05-16 DIAGNOSIS — F0390 Unspecified dementia without behavioral disturbance: Secondary | ICD-10-CM | POA: Diagnosis present

## 2017-05-16 DIAGNOSIS — R630 Anorexia: Secondary | ICD-10-CM | POA: Diagnosis present

## 2017-05-16 DIAGNOSIS — I10 Essential (primary) hypertension: Secondary | ICD-10-CM | POA: Diagnosis not present

## 2017-05-16 DIAGNOSIS — I4940 Unspecified premature depolarization: Secondary | ICD-10-CM | POA: Diagnosis present

## 2017-05-16 DIAGNOSIS — E876 Hypokalemia: Secondary | ICD-10-CM | POA: Diagnosis present

## 2017-05-16 DIAGNOSIS — I2510 Atherosclerotic heart disease of native coronary artery without angina pectoris: Secondary | ICD-10-CM | POA: Diagnosis present

## 2017-05-16 DIAGNOSIS — E039 Hypothyroidism, unspecified: Secondary | ICD-10-CM | POA: Diagnosis present

## 2017-05-16 DIAGNOSIS — K921 Melena: Secondary | ICD-10-CM | POA: Diagnosis not present

## 2017-05-16 DIAGNOSIS — E785 Hyperlipidemia, unspecified: Secondary | ICD-10-CM | POA: Diagnosis present

## 2017-05-16 DIAGNOSIS — Z800 Family history of malignant neoplasm of digestive organs: Secondary | ICD-10-CM | POA: Diagnosis not present

## 2017-05-16 DIAGNOSIS — Z803 Family history of malignant neoplasm of breast: Secondary | ICD-10-CM | POA: Diagnosis not present

## 2017-05-16 DIAGNOSIS — I4891 Unspecified atrial fibrillation: Secondary | ICD-10-CM | POA: Diagnosis present

## 2017-05-16 DIAGNOSIS — M1990 Unspecified osteoarthritis, unspecified site: Secondary | ICD-10-CM | POA: Diagnosis present

## 2017-05-16 DIAGNOSIS — K649 Unspecified hemorrhoids: Secondary | ICD-10-CM | POA: Diagnosis present

## 2017-05-16 DIAGNOSIS — K5730 Diverticulosis of large intestine without perforation or abscess without bleeding: Secondary | ICD-10-CM

## 2017-05-16 DIAGNOSIS — Z8673 Personal history of transient ischemic attack (TIA), and cerebral infarction without residual deficits: Secondary | ICD-10-CM | POA: Diagnosis not present

## 2017-05-16 DIAGNOSIS — K5731 Diverticulosis of large intestine without perforation or abscess with bleeding: Secondary | ICD-10-CM | POA: Diagnosis present

## 2017-05-16 DIAGNOSIS — Z79899 Other long term (current) drug therapy: Secondary | ICD-10-CM | POA: Diagnosis not present

## 2017-05-16 LAB — BASIC METABOLIC PANEL
ANION GAP: 7 (ref 5–15)
BUN: 7 mg/dL (ref 6–20)
CO2: 25 mmol/L (ref 22–32)
Calcium: 8.8 mg/dL — ABNORMAL LOW (ref 8.9–10.3)
Chloride: 108 mmol/L (ref 101–111)
Creatinine, Ser: 0.48 mg/dL (ref 0.44–1.00)
GFR calc Af Amer: >60 mL/min (ref >60)
GLUCOSE: 84 mg/dL (ref 65–99)
Potassium: 3.4 mmol/L — ABNORMAL LOW (ref 3.5–5.1)
Sodium: 140 mmol/L (ref 135–145)

## 2017-05-16 LAB — CBC
HEMATOCRIT: 38 % (ref 36.0–46.0)
Hemoglobin: 12.6 g/dL (ref 12.0–15.0)
MCH: 31.1 pg (ref 26.0–34.0)
MCHC: 33.2 g/dL (ref 30.0–36.0)
MCV: 93.8 fL (ref 78.0–100.0)
Platelets: 219 10*3/uL (ref 150–400)
RBC: 4.05 MIL/uL (ref 3.87–5.11)
RDW: 12.8 % (ref 11.5–15.5)
WBC: 8 10*3/uL (ref 4.0–10.5)

## 2017-05-16 MED ORDER — METOPROLOL TARTRATE 50 MG PO TABS
50.0000 mg | ORAL_TABLET | Freq: Two times a day (BID) | ORAL | Status: DC
  Administered 2017-05-16 – 2017-05-17 (×3): 50 mg via ORAL
  Filled 2017-05-16 (×3): qty 1

## 2017-05-16 MED ORDER — POLYETHYLENE GLYCOL 3350 17 GM/SCOOP PO POWD
1.0000 | Freq: Once | ORAL | Status: AC
  Administered 2017-05-16: 255 g via ORAL
  Filled 2017-05-16: qty 255

## 2017-05-16 NOTE — Progress Notes (Signed)
PT Cancellation Note  Patient Details Name: Leah Figueroa MRN: [id number] DOB: 04-Oct-1922   Cancelled Treatment:    Reason Eval/Treat Not Completed: Patient declined, no reason specified. 05/16/2017  Dundee Bing, PT [phone]  (pager)   [name] [name] 05/16/2017, 6:32 PM

## 2017-05-16 NOTE — Progress Notes (Signed)
Pt admitted from ER, on arrival pt is alert and oriented x4, appears to be slightly forgetful. VSS. Pt oriented to unit and room. Skin assessment completed with 2nd RN, Skin under breasts red but intact. Skin reaction to tele leads. WCTM

## 2017-05-16 NOTE — Consult Note (Signed)
Gastroenterology Consult: 8:23 AM 05/16/2017  LOS: 0 days    Referring Provider: Dr Benjamine Mola  Primary Care Physician:  Merlene Laughter, MD Primary Gastroenterologist:  Yancey Flemings MD     Reason for Consultation:  Painless hematochezia   HPI: Leah Figueroa is a 83 y.o. female.  PMH CVA, on Plavix. Iron and B 12 def anemia, taking PO iron.  Atrial fibrillation on EKG of 05/02/17, not on Coumadin. S/p cholecystectomy.   Hx dysphagia.  Esophagram 04/2014 showed large HH, silent aspiration.  Did not return to Dr Marina Goodell for follow up. On follow up esophagram a few weeks later had no aspiration.  Mild pharyngeal dysphagia on 10/2015 MBS.   Has never had EGD or colonoscopy per Dr. Lamar Sprinkles office note in 2015.   Admission 9/17 - 05/05/17 with Klebsiella UTI and TIA.  Right sided weakness resolved. CT ab/pelvix with contrast 9/20: scattered colon tics.   Discharged on Keflex.     Returned to ED and admitted 9/30.  C/o painless hematochezia.  No adequate BM for 1 week, took laxative without result.  On AM of admit, passed clots of red blood.  No pain, no N/V.  On DRE impacted stool and hemorrhoids noted. Staff unable to break up impacted stool.  Vital signs stable.   Hgb last admit: 12.6 -14.5.  Current admission: 12.5 >> 13.1 >> 12.6.  Platelets, WBCs normal.  Coags normal 10 days ago.  BUN normal.  Iron, Iron sats, ferritin normal.  TIBC mildly reduced.  TSH normal.   Plavix not discontinued.   Takes po iron in cycle of 7 days on, 7 days off.     After receiving Colace, milk of magnesia, MiraLAX yesterday, she had a nonbloody stool and 1 AM this morning.  Patient still has no complaint of abdominal pain, nausea, vomiting. She often has constipation, going several days between bowel movements but has not been aware of any blood in her  stool.   Past Medical History:  Diagnosis Date  . Acute myocardial infarction of other anterior wall, episode of care unspecified   . Arthritis   . Coronary artery disease   . Other and unspecified hyperlipidemia   . Personal history of arthritis   . Personal history of malignant neoplasm of breast    s/p lumpectomy  . Stroke [facility]) 03/2015   "left sided issues"  . Unspecified essential hypertension   . Unspecified hypothyroidism     Past Surgical History:  Procedure Laterality Date  . BACK SURGERY    . CATARACT EXTRACTION     left-2006, right 2011  . CHOLECYSTECTOMY    . intrathoracic goiter resection    . lumpectomy for breast cancer Left 80's   radiation therapy  . right thyroid lobectomy     Hx Goiter    Prior to Admission medications   Medication Sig Start Date End Date Taking? Authorizing Provider  acetaminophen (TYLENOL) 325 MG tablet Take 2 tablets (650 mg total) by mouth every 4 (four) hours as needed for mild pain. Patient taking differently: Take 325  mg by mouth at bedtime as needed for mild pain.  04/07/15  Yes Love, Evlyn Kanner, PA-C  ALPRAZolam (XANAX) 0.25 MG tablet TAKE 0.25 MG BY MOUTH AT dinner 08/31/16  Yes [provider]  amLODipine (NORVASC) 5 MG tablet Take 5 mg by mouth daily. 08/23/15  Yes [provider]  atorvastatin (LIPITOR) 10 MG tablet Take 10 mg by mouth daily at 6 PM.    Yes [provider]  clopidogrel (PLAVIX) 75 MG tablet Take 1 tablet (75 mg total) by mouth daily. 04/07/15  Yes Love, Evlyn Kanner, PA-C  docusate sodium (COLACE) 100 MG capsule Take 100 mg by mouth at bedtime as needed for mild constipation.   Yes [provider]  ferrous sulfate 325 (65 FE) MG tablet Take 325 mg by mouth See admin instructions. Take 1 tablet daily for 7 days then stop for 7 days.  Repeat cycle   Yes [provider]  ipratropium (ATROVENT) 0.03 % nasal spray Place 2 sprays into both nostrils 3 (three) times daily as needed.  05/11/17  Yes [provider]  levothyroxine (SYNTHROID, LEVOTHROID) 25 MCG tablet Take 1 tablet by mouth  daily Patient taking differently: Take 25 mcg by mouth daily 12/25/14  Yes Plotnikov, Georgina Quint, MD  Melatonin 10 MG TABS Take 10 mg by mouth at bedtime.    Yes [provider]  metoprolol (LOPRESSOR) 50 MG tablet Take 50 mg by mouth 2 (two) times daily. 08/30/15  Yes [provider]  ondansetron (ZOFRAN) 8 MG tablet Take 1 tablet (8 mg total) by mouth every 8 (eight) hours as needed for nausea or vomiting. 05/04/17  Yes Tyrone Nine, MD  cephALEXin (KEFLEX) 500 MG capsule Take 1 capsule (500 mg total) by mouth 2 (two) times daily. 05/04/17   Tyrone Nine, MD  Cholecalciferol (VITAMIN D3) 5000 units CAPS Take 5,000 Units by mouth daily.     [provider]  meclizine (ANTIVERT) 25 MG tablet Take 1 tablet (25 mg total) by mouth 3 (three) times daily. 05/05/17   Tyrone Nine, MD  vitamin B-12 (CYANOCOBALAMIN) 1000 MCG tablet Take 1,000 mcg by mouth daily.     [provider]    Scheduled Meds: . ALPRAZolam  0.25 mg Oral Q supper  . clopidogrel  75 mg Oral Daily  . docusate sodium  100 mg Oral BID  . ipratropium  2 spray Each Nare TID AC & HS  . levothyroxine  25 mcg Oral QAC breakfast  . meclizine  25 mg Oral TID  . polyethylene glycol  17 g Oral QHS  . sodium chloride flush  3 mL Intravenous Q12H   Infusions: . sodium chloride 100 mL/hr at 05/15/17 2340   PRN Meds: acetaminophen **OR** acetaminophen   Allergies as of 05/15/2017  . (No Known Allergies)    Family History  Problem Relation Age of Onset  . Colon cancer Mother   . Breast cancer Mother   . Coronary artery disease Brother   . Dementia Sister     Social History   Social History  . Marital status: Widowed    Spouse name: N/A  . Number of children: 1  . Years of education: 57   Occupational History  . retired    Social History Main Topics  . Smoking status:  Never Smoker  . Smokeless tobacco: Never Used  . Alcohol use No  . Drug use: No  . Sexual activity: No   Other Topics Concern  .  Not on file   Social History Narrative   HSG. Married '48- 57 yrs/widowed. 1 daughter - '59; 1 grandchild. 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  Lives alone. I-ADLs, manages books, pays bills.. End-of-Life Care: no heroic measures; No CPR, no mechanical ventilation.  Caffeine use/day:  None. Does Patient Exercise:  No       03/07/17 lives with daughter, [name]       REVIEW OF SYSTEMS: Constitutional:  Denies weakness, denies fatigue. ENT:  Admits to hearing difficulties. Previously wore hearing aids but not currently.No nose bleeds Pulm:  Denies shortness of breath or cough. CV:  No palpitations, no LE edema. No chest pain GU:  No hematuria, no frequency GI:  Endorses dysphagia to liquids as well as solids. No heartburn. No anorexia. Heme:  Denies unusual bleeding or bruising.   Transfusions:  No recall of previous transfusions. Neuro:  No headaches, no peripheral tingling or numbness Derm:  No itching, no rash or sores.  Endocrine:  No sweats or chills.  No polyuria or dysuria Immunization:  Did not inquire as to recent immunizations. Travel:  None beyond local counties in last few months.    PHYSICAL EXAM: Vital signs in last 24 hours: Vitals:   05/15/17 2056 05/16/17 0458  BP: (!) 155/82 (!) 154/94  Pulse: (!) 57 90  Resp: 18 18  Temp: 97.8 F (36.6 C) 98.4 F (36.9 C)  SpO2: 97% 95%   Wt Readings from Last 3 Encounters:  05/15/17 43.6 kg (96 lb 1.6 oz)  05/03/17 45.3 kg (99 lb 13.9 oz)  03/07/17 45.1 kg (99 lb 6.4 oz)    General: pleasant, somewhat frail but overall well appearing aged WF.  She is Sitting in bed and comfortable. Head:  No facial asymmetry or swelling. No signs of head trauma.  Eyes:  No scleral icterus. No conjunctival pallor. Ears:  Hard of hearing  Nose:  No discharge or congestion. Mouth:  Tongue is midline.  Oral mucosa is pink, moist, clear. Dental repair is good. Has a lower partial plate in place. Neck:  No JVD, no thyromegaly, no masses. Lungs:  Diminished breath sounds on the right. No adventitious sounds. No cough No dyspnea. Heart: irregularly irregular. A. Fib with rate in the 60s on telemetry monitor. No MRG. S1, S2 present. Abdomen:  Soft. Not distended or tender. Bowel sounds active. No HSM, bruits, hernias..   Rectal: no physical or palpable hemorrhoids. Abundant soft brown stool Crouch the rectal vault. No visible blood.   Musc/Skeltl: no joint erythema. Spinal kyphosis. Extremities:  No CCE.  Neurologic:  Moves all 4 limbs, strength not tested. No tremor.  Fully alert. Oriented to place, year, [place]. Memory for current medications and medical issues is sketchy.   Skin:  No rash,  No sores.  No significant purpura or bruising.   Tattoos:  none Nodes:  No cervical or inguinal adenopathy.   Psych:  Calm, pleasant, cooperative.  Intake/Output from previous day: 09/30 0701 - 10/01 0700 In: 1100 [I.V.:1100] Out: 700 [Urine:700] Intake/Output this shift: Total I/O In: -  Out: 300 [Urine:300]  LAB RESULTS:  Recent Labs  05/15/17 1208 05/15/17 1823 05/16/17 0554  WBC 6.2 7.2 8.0  HGB 12.5 13.1 12.6  HCT 37.9 39.1 38.0  PLT 232 221 219   BMET Lab Results  Component Value Date   NA 140 05/16/2017   NA 139 05/15/2017   NA 137 05/05/2017   K 3.4 (L) 05/16/2017   K 3.5 05/15/2017   K  3.6 05/05/2017   CL 108 05/16/2017   CL 108 05/15/2017   CL 100 (L) 05/05/2017   CO2 25 05/16/2017   CO2 25 05/15/2017   CO2 27 05/05/2017   GLUCOSE 84 05/16/2017   GLUCOSE 80 05/15/2017   GLUCOSE 118 (H) 05/05/2017   BUN 7 05/16/2017   BUN 11 05/15/2017   BUN 9 05/05/2017   CREATININE 0.48 05/16/2017   CREATININE 0.53 05/15/2017   CREATININE 0.58 05/05/2017   CALCIUM 8.8 (L) 05/16/2017   CALCIUM 9.0 05/15/2017   CALCIUM 9.8 05/05/2017   LFT  Recent Labs  05/15/17 1208  PROT  6.1*  ALBUMIN 3.3*  AST 29  ALT 18  ALKPHOS 52  BILITOT 0.7   PT/INR Lab Results  Component Value Date   INR 1.02 05/02/2017   INR 1.05 03/23/2015    Drugs of Abuse     Component Value Date/Time   LABOPIA NONE DETECTED 05/02/2017 2042   COCAINSCRNUR NONE DETECTED 05/02/2017 2042   LABBENZ POSITIVE (A) 05/02/2017 2042   AMPHETMU NONE DETECTED 05/02/2017 2042   THCU NONE DETECTED 05/02/2017 2042   LABBARB NONE DETECTED 05/02/2017 2042     RADIOLOGY STUDIES: No results found.   IMPRESSION:   *  Painless hematochezia.   Diverticular?, established diagnosis of colonic diverticulosis per CT 2 weeks ago.   Hemorrhoids, could be cause Obstipation, fecal impaction so sterdoral ulcer possible.  She appears to have background issue of constipation.   No previous colonoscopy.  R/o neoplasia: though nothing worrisome in this regard on CT 2 weeks ago.    *  Hx anemia.  On po iron and po B12 at home. Iron, b12, folate levels of 9/30 are excellent. Hgb levels currently near normal.    *  Atrial fibrillation. Not on Coumadin. Not listed as a medical problem but reviewed EKG from 2 weeks ago and this revealed atrial fibrillation as does current EKG.     PLAN:     *  Needs laxatives to clear stool.  I added Miralax/Gatorade prep as this is most palatable.  ? Pursue colonoscopy?, will D/w Dr Russella Dar and pt's dtr. Would need to be off Plavix for this if biopsy anticipated.  Leave on clears for now.     [name]  05/16/2017, 8:23 AM Pager: [phone]     Attending physician's note   I have taken a history, examined the patient and reviewed the chart. I agree with the Advanced Practitioner's note, impression and recommendations. Painless hematochezia in setting of Plavix and worsening constipation. Diverticulosis, hemorrhoids, stercoral ulcer all in differential diagnosis of her LGI bleed. Recent abd/pelvic CT is reassuring in that no colon mass was noted. Colonoscopy would be  optimal to further evaluate the source of bleeding however I am not sure she could tolerate a full bowel prep and, although she would consider colonoscopy when we discussed it today, she seemed quite reluctant. Timing in regard to holding Plavix to allow polypectomy, biopsy if needed is another factor. Added laxatives. Trend CBC. Will reassess her tomorrow and further discuss options with her and her family members if they are available.   Claudette Head, MD Clementeen Graham [phone] Mon-Fri 8a-5p [phone] after 5p, weekends, holidays

## 2017-05-16 NOTE — Progress Notes (Addendum)
PROGRESS NOTE    Leah Figueroa  ZOX:[id number] DOB: [birth year] DOA: 05/15/2017 PCP: Merlene Laughter, MD   Outpatient Specialists:     Brief Narrative:  Leah Figueroa is a 83 y.o. female with medical history significant for CAD, hypertension, hypothyroidism and dyslipidemia. Patient recently discharged on 9/19 after hospital admission for altered mental status found to be secondary to Klebsiella UTI. She has completed oral Keflex. Patient returns to ED today daughter reporting one week of constipation. Patient was given laxative overnight but has had no success in passing bowel movement. Home health nurse was assisting patient to toilet and noticed after attempts to pass bowel movement patient had "red clumps" of blood in the toilet. Patient was asymptomatic without nausea, vomiting, abdominal pain. CT abdomen and pelvis performed on previous admission on 9/20 revealed pancolonic diverticula. The physician assistant to examine the patient performed a rectal exam and noted significant amount of stool in the rectal vault as well as hemorrhoids and attempted to break up the impaction but was unsuccessful.   Assessment & Plan:   Principal Problem:   Hematochezia Active Problems:   Coronary artery disease   Other and unspecified hyperlipidemia   Hypothyroidism   HTN (hypertension)   Hematochezia -Patient presents with one episode of bright red blood per rectum (painless )  seen on toilet paper and in toilet after reported 1 week constipation -On exam in ER was noted to have stool in rectal vault and hemorrhoids -Recent CT with pancolonic diverticula -Hemodynamically stable with stable hemoglobin -GI consultation appreciated -Focus on disimpaction: MOM 30 mL 1, Colace 100 twice a day, MiraLAX 17 g at at bedtime -Monitor for recurrent bleeding (holding plavix until seen by GI)    Coronary artery disease -Currently asymptomatic -hold plavix -Hold statin -resume BB    HTN  (hypertension) -resume BB    Other and unspecified hyperlipidemia -Hold statin until diet advanced to solid    Hypothyroidism -Continue Synthroid -TSH  EKG with sinus/PACs-- doubt a fib but possible -discuseds with cardiology-- echo from last admission patient was not in a fib-- EKG upon admission stated a fib as well -not a candidate for anticoagulation regardless with GI bleeding -repeat EKG ordered  Dizziness -per daughter on-going issue -check orthos -PT Eval  DVT prophylaxis:  SCD's  Code Status: DNR   Family Communication: At bedside  Disposition Plan:     Consultants:   GI   Subjective: C/o dizziness when she stands up   Objective: Vitals:   05/15/17 1930 05/15/17 1945 05/15/17 2056 05/16/17 0458  BP: 139/70 138/72 (!) 155/82 (!) 154/94  Pulse: (!) 56 (!) 55 (!) 57 90  Resp: Temp:   97.8 F (36.6 C) 98.4 F (36.9 C)  TempSrc:   Oral Oral  SpO2: 96% 94% 97% 95%  Weight:   43.6 kg (96 lb 1.6 oz)   Height:    (1.295 m)     Intake/Output Summary (Last 24 hours) at 05/16/17 1119 Last data filed at 05/16/17 0942  Gross per 24 hour  Intake             1100 ml  Output             1301 ml  Net             -201 ml   Filed Weights   05/15/17 1122 05/15/17 2056  Weight: 44.9 kg (99 lb) 43.6 kg (96 lb 1.6 oz)    Examination:  General exam: Appears calm and comfortable  Respiratory system: Clear to auscultation. Respiratory effort normal. Cardiovascular system: S1 & S2 heard, RRR. No JVD, murmurs, rubs, gallops or clicks. No pedal edema. Gastrointestinal system: Abdomen is nondistended, soft and nontender. No organomegaly or masses felt. Normal bowel sounds heard. Central nervous system: Alert and oriented. No focal neurological deficits. Extremities: Symmetric 5 x 5 power. Skin: No rashes, lesions or ulcers Psychiatry: Judgement and insight appear normal. Mood & affect appropriate.     Data Reviewed: I have personally  reviewed following labs and imaging studies  CBC:  Recent Labs Lab 05/15/17 1208 05/15/17 1823 05/16/17 0554  WBC 6.2 7.2 8.0  NEUTROABS 3.8 4.7  --   HGB 12.5 13.1 12.6  HCT 37.9 39.1 38.0  MCV 94.8 94.4 93.8  PLT 232 221 219   Basic Metabolic Panel:  Recent Labs Lab 05/15/17 1208 05/16/17 0554  NA 139 140  K 3.5 3.4*  CL 108 108  CO2 25 25  GLUCOSE 80 84  BUN 11 7  CREATININE 0.53 0.48  CALCIUM 9.0 8.8*   GFR: Estimated Creatinine Clearance: 22.9 mL/min (by C-G formula based on SCr of 0.48 mg/dL). Liver Function Tests:  Recent Labs Lab 05/15/17 1208  AST 29  ALT 18  ALKPHOS 52  BILITOT 0.7  PROT 6.1*  ALBUMIN 3.3*   No results for input(s): LIPASE, AMYLASE in the last 168 hours. No results for input(s): AMMONIA in the last 168 hours. Coagulation Profile: No results for input(s): INR, PROTIME in the last 168 hours. Cardiac Enzymes: No results for input(s): CKTOTAL, CKMB, CKMBINDEX, TROPONINI in the last 168 hours. BNP (last 3 results) No results for input(s): PROBNP in the last 8760 hours. HbA1C: No results for input(s): HGBA1C in the last 72 hours. CBG: No results for input(s): GLUCAP in the last 168 hours. Lipid Profile: No results for input(s): CHOL, HDL, LDLCALC, TRIG, CHOLHDL, LDLDIRECT in the last 72 hours. Thyroid Function Tests:  Recent Labs  05/15/17 1823  TSH 2.245   Anemia Panel:   Recent Labs  05/15/17 1823  VITAMINB12 903  FOLATE 15.5  FERRITIN 139  TIBC 235*  IRON 72  RETICCTPCT 0.9   Urine analysis:    Component Value Date/Time   COLORURINE YELLOW 05/02/2017 2042   APPEARANCEUR HAZY (A) 05/02/2017 2042   LABSPEC 1.012 05/02/2017 2042   PHURINE 5.0 05/02/2017 2042   GLUCOSEU NEGATIVE 05/02/2017 2042   HGBUR SMALL (A) 05/02/2017 2042   BILIRUBINUR NEGATIVE 05/02/2017 2042   KETONESUR NEGATIVE 05/02/2017 2042   PROTEINUR NEGATIVE 05/02/2017 2042   UROBILINOGEN 1.0 03/23/2015 0257   NITRITE NEGATIVE 05/02/2017 2042    LEUKOCYTESUR LARGE (A) 05/02/2017 2042     )No results found for this or any previous visit (from the past 240 hour(s)).    Anti-infectives    None       Radiology Studies: No results found.      Scheduled Meds: . ALPRAZolam  0.25 mg Oral Q supper  . docusate sodium  100 mg Oral BID  . ipratropium  2 spray Each Nare TID AC & HS  . levothyroxine  25 mcg Oral QAC breakfast  . meclizine  25 mg Oral TID  . polyethylene glycol  17 g Oral QHS  . polyethylene glycol powder  1 Container Oral Once  . sodium chloride flush  3 mL Intravenous Q12H   Continuous Infusions: . sodium chloride 100 mL/hr at 05/16/17 1110     LOS: 0 days  Time spent: 25 min    JESSICA Juanetta Gosling, DO [facility] Pager [phone]  If 7PM-7AM, please contact night-coverage [email or web address] Password [email or web address] 05/16/2017, 11:19 AM

## 2017-05-17 LAB — CBC
HEMATOCRIT: 42.2 % (ref 36.0–46.0)
HEMOGLOBIN: 14.3 g/dL (ref 12.0–15.0)
MCH: 31.6 pg (ref 26.0–34.0)
MCHC: 33.9 g/dL (ref 30.0–36.0)
MCV: 93.2 fL (ref 78.0–100.0)
Platelets: 223 10*3/uL (ref 150–400)
RBC: 4.53 MIL/uL (ref 3.87–5.11)
RDW: 12.6 % (ref 11.5–15.5)
WBC: 8.8 10*3/uL (ref 4.0–10.5)

## 2017-05-17 LAB — BASIC METABOLIC PANEL
ANION GAP: 8 (ref 5–15)
BUN: <5 mg/dL — ABNORMAL LOW (ref 6–20)
CHLORIDE: 106 mmol/L (ref 101–111)
CO2: 27 mmol/L (ref 22–32)
CREATININE: 0.51 mg/dL (ref 0.44–1.00)
Calcium: 9.2 mg/dL (ref 8.9–10.3)
GFR calc non Af Amer: >60 mL/min (ref >60)
Glucose, Bld: 106 mg/dL — ABNORMAL HIGH (ref 65–99)
POTASSIUM: 3.2 mmol/L — AB (ref 3.5–5.1)
Sodium: 141 mmol/L (ref 135–145)

## 2017-05-17 MED ORDER — BISACODYL 5 MG PO TBEC: 5.0000 mg | DELAYED_RELEASE_TABLET | Freq: Every day | ORAL | 0 refills | Status: DC | PRN

## 2017-05-17 MED ORDER — POTASSIUM CHLORIDE CRYS ER 20 MEQ PO TBCR
40.0000 meq | EXTENDED_RELEASE_TABLET | Freq: Once | ORAL | Status: AC
  Administered 2017-05-17: 40 meq via ORAL
  Filled 2017-05-17: qty 2

## 2017-05-17 MED ORDER — POLYETHYLENE GLYCOL 3350 17 G PO PACK: 17.0000 g | PACK | Freq: Every day | ORAL | 0 refills | Status: DC

## 2017-05-17 MED ORDER — BISACODYL 5 MG PO TBEC: 5.0000 mg | DELAYED_RELEASE_TABLET | Freq: Every day | ORAL | Status: DC | PRN

## 2017-05-17 MED ORDER — POLYETHYLENE GLYCOL 3350 17 G PO PACK: 17.0000 g | PACK | Freq: Every day | ORAL | Status: DC | PRN

## 2017-05-17 MED ORDER — DOCUSATE SODIUM 100 MG PO CAPS: 100.0000 mg | ORAL_CAPSULE | Freq: Two times a day (BID) | ORAL | Status: DC | PRN

## 2017-05-17 NOTE — Care Management Note (Signed)
Case Management Note  Patient Details  Name: Leah Figueroa MRN: [id number] Date of Birth: 1923-05-14  Subjective/Objective:                  Admitted admitted with constipation and dizziness/vertigo. From home with daughter Leah Figueroa. PTA daughter states independent with ADL's. DME: walker, 3 in1.   PCP: Dr. Merlene Laughter  Action/Plan: Plan is to d/c to home today. Daughter to provide transportation to home. Daughter is requesting home health services( vestibular PT) to begin on 05/23/2017 because pt will be with a family member that lives in [place] until she's back from a beach trip.  Expected Discharge Date:  05/17/17               Expected Discharge Plan:  Home w Home Health Services  In-House Referral:     Discharge planning Services  CM Consult  Post Acute Care Choice:    Choice offered to:     DME Arranged:    DME Agency:     HH Arranged:  PT (vestibular therapy) HH Agency:   [facility], referral made with [facility] @ [phone].  Status of Service:  Completed, signed off  If discussed at Long Length of Stay Meetings, dates discussed:    Additional Comments:  Epifanio Lesches, RN 05/17/2017, 6:29 PM

## 2017-05-17 NOTE — Evaluation (Signed)
Physical Therapy Evaluation Patient Details Name: Leah Figueroa MRN: [id number] DOB: Jan 08, 1923 Today's Date: 05/17/2017   History of Present Illness   Ornella E Dail is a 83 y.o. female with medical history significant for CAD, hypertension, hypothyroidism and dyslipidemia. Patient recently discharged on 9/19 after hospital admission for altered mental status found to be secondary to Klebsiella UTI. She has completed oral Keflex. Patient returns to ED today daughter reporting one week of constipation.  Clinical Impression  Pt admitted with/for constipation and dizziness/vertigo.  Pt needing min assist or more for amb and basic mobility which is not at her baseline.  Pt showed to have R posterior BPPV and needs further vestibular treatment..  Pt currently limited functionally due to the problems listed below.  (see problems list.)  Pt will benefit from PT to maximize function and safety to be able to get home safely with available assist .     Follow Up Recommendations Home health PT;Other (comment) (vestibular PT)    Equipment Recommendations  None recommended by PT    Recommendations for Other Services       Precautions / Restrictions Precautions Precautions: Fall      Mobility  Bed Mobility Overal bed mobility: Needs Assistance Bed Mobility: Rolling;Sidelying to Sit;Sit to Sidelying Rolling: Supervision Sidelying to sit: Min assist   Sit to supine: Min assist   General bed mobility comments: pt needed assist and cuing  Transfers Overall transfer level: Needs assistance   Transfers: Sit to/from Stand Sit to Stand: Min assist         General transfer comment: min for steady assist and general safety  Ambulation/Gait Ambulation/Gait assistance: Min assist Ambulation Distance (Feet): 100 Feet Assistive device: Rolling walker (2 wheeled) Gait Pattern/deviations: Step-through pattern   Gait velocity interpretation: Below normal speed for age/gender General Gait  Details: gait mildly unsteady with frequent episodes of scissoring and heavy drift right.  Stairs            Wheelchair Mobility    Modified Rankin (Stroke Patients Only)       Balance     Sitting balance-[name] Scale: Fair       Standing balance-[name] Scale: Poor Standing balance comment: reliant on external support for safety this admission                             Pertinent Vitals/Pain Pain Assessment: No/denies pain    Home Living Family/patient expects to be discharged to:: Private residence Living Arrangements: Children Available Help at Discharge: Family;Other (Comment) (hired help 3 nights a week) Type of Home: House Home Access: Stairs to enter   [employer] of Steps: 1 Home Layout: One level Home Equipment: Walker - 4 wheels;Grab bars - tub/shower      Prior Function Level of Independence: Needs assistance   Gait / Transfers Assistance Needed: Someone around 24/7, last month able to go to bathroom alone at night, now is too unsteady.  Assisted for ADL's           Hand Dominance   Dominant Hand: Right    Extremity/Trunk Assessment   Upper Extremity Assessment Upper Extremity Assessment: Overall WFL for tasks assessed;Generalized weakness    Lower Extremity Assessment Lower Extremity Assessment: Overall WFL for tasks assessed;Generalized weakness    Cervical / Trunk Assessment Cervical / Trunk Assessment: Kyphotic  Communication   Communication: No difficulties  Cognition Arousal/Alertness: Awake/alert Behavior During Therapy: WFL for tasks assessed/performed Overall Cognitive Status:  Impaired/Different from baseline                                 General Comments: Easily distracted and often doesn't make sense when she talks to you.      General Comments General comments (skin integrity, edema, etc.): Vestibular assessment focused on posterior and horizontal BPPV.  Hallpike Dix negative  on  the Left and positive on R for symptoms and torsional nystagmus.  Treated R post BPPV with EPPLY x2.  Second EPPLY maneuver failed when pt had diarrhea and flailed her head coming to sit..  Ass a precaution, check supine head roll after mishap and no signs of horizontal BPPV.    Exercises     Assessment/Plan    PT Assessment Patient needs continued PT services  PT Problem List Decreased activity tolerance;Decreased balance;Decreased mobility (bppv)       PT Treatment Interventions DME instruction;Gait training;Functional mobility training;Therapeutic activities;Other (comment) (vestibular testing)    PT Goals (Current goals can be found in the Care Plan section)  Acute Rehab PT Goals Patient Stated Goal: daughter wants pt well and manageable at home. PT Goal Formulation: With patient/family Time For Goal Achievement: 05/24/17 Potential to Achieve Goals: Good    Frequency Min 3X/week   Barriers to discharge        Co-evaluation               AM-PAC PT "6 Clicks" Daily Activity  Outcome Measure Difficulty turning over in bed (including adjusting bedclothes, sheets and blankets)?: A Little Difficulty moving from lying on back to sitting on the side of the bed? : Unable Difficulty sitting down on and standing up from a chair with arms (e.g., wheelchair, bedside commode, etc,.)?: Unable Help needed moving to and from a bed to chair (including a wheelchair)?: A Little Help needed walking in hospital room?: A Little Help needed climbing 3-5 steps with a railing? : A Little 6 Click Score: 14    End of Session   Activity Tolerance: Patient tolerated treatment well (limited by diarrhea) Patient left: in bed;with call bell/phone within reach;with bed alarm set;with family/visitor present Nurse Communication: Mobility status PT Visit Diagnosis: BPPV BPPV - Right/Left : Right    Time: 4782-9562 PT Time Calculation (min) (ACUTE ONLY): 66 min   Charges:   PT Evaluation $PT  Eval Moderate Complexity: 1 Mod PT Treatments $Gait Training: 8-22 mins $Neuromuscular Re-education: 8-22 mins $Canalith Rep Proc: 8-22 mins   PT G Codes:        05-30-17  [place] Bing, PT [phone]  (pager)  [name] Rosaly Labarbera 30-May-2017, 3:51 PM

## 2017-05-17 NOTE — Progress Notes (Signed)
Patient's daughter given all discharge teaching given, including activity, diet, follow-up appoints, and medications. Patient's daughter verbalized understanding of all discharge instructions. IV access was d/c'd. Vitals are stable. Skin is intact except as charted in most recent assessments. Discharge prolonged due to awaiting PT eval and home health set up.  Pt to be escorted out by NT, to be driven home by family.

## 2017-05-17 NOTE — Discharge Summary (Signed)
Physician Discharge Summary  Leah Figueroa WUJ:[id number] DOB: May 24, 1923 DOA: 05/15/2017  PCP: Merlene Laughter, MD  Admit date: 05/15/2017 Discharge date: 05/17/2017   Recommendations for Outpatient Follow-Up:   1. Home health vestibular PT 2. Bowel regimen 3. BMP at next office visit   Discharge Diagnosis:   Principal Problem:   Hematochezia Active Problems:   Coronary artery disease   Other and unspecified hyperlipidemia   Hypothyroidism   HTN (hypertension)   Discharge disposition:  Home  Discharge Condition: Improved.  Diet recommendation: Low sodium, heart healthy  Wound care: None.   History of Present Illness:   Leah Figueroa is a 83 y.o. female with medical history significant for CAD, hypertension, hypothyroidism and dyslipidemia. Patient recently discharged on 9/19 after hospital admission for altered mental status found to be secondary to Klebsiella UTI. She has completed oral Keflex. Patient returns to ED today daughter reporting one week of constipation. Patient was given laxative overnight but has had no success in passing bowel movement. Home health nurse was assisting patient to toilet and noticed after attempts to pass bowel movement patient had "red clumps" of blood in the toilet. Patient was asymptomatic without nausea, vomiting, abdominal pain. CT abdomen and pelvis performed on previous admission on 9/20 revealed pancolonic diverticula. The physician assistant to examine the patient performed a rectal exam and noted significant amount of stool in the rectal vault as well as hemorrhoids and attempted to break up the impaction but was unsuccessful.   Hospital Course by Problem:   Hematochezia -Patient presents with one episode of bright red blood per rectum (painless ) seen on toilet paper and in toilet after reported 1 week constipation -On exam in ER was noted to have stool in rectal vault and hemorrhoids -Recent CT with pancolonic  diverticula -Hemodynamically stable with stable hemoglobin -bowel regimen to avoid constipation  Coronary artery disease -resume home meds  HTN (hypertension) -resume BB  Hypokalemia -repleted  Other and unspecified hyperlipidemia -resume statin  Hypothyroidism -Continue Synthroid -TSH normal  EKG with sinus/PACs-- doubt a fib but possible -discuseds with cardiology-- echo from last admission patient was not in a fib-- EKG upon admission stated a fib as well -not a candidate for anticoagulation regardless with GI bleeding  Dizziness -per daughter on-going issue -vestibular PT at home    Medical Consultants:    GI   Discharge Exam:   Vitals:   05/17/17 0501 05/17/17 1326  BP: (!) 148/93 (!) 163/92  Pulse: 69 (!) 58  Resp: 18 16  Temp: 97.9 F (36.6 C) (!) 97.5 F (36.4 C)  SpO2: 97% 98%   Vitals:   05/16/17 1314 05/16/17 2052 05/17/17 0501 05/17/17 1326  BP: 132/66 (!) 145/66 (!) 148/93 (!) 163/92  Pulse: 89 66 69 (!) 58  Resp: Temp: 98.1 F (36.7 C) (!) 97.5 F (36.4 C) 97.9 F (36.6 C) (!) 97.5 F (36.4 C)  TempSrc: Oral Oral  Oral  SpO2: 94% 96% 97% 98%  Weight:      Height:        Gen:  NAD    The results of significant diagnostics from this hospitalization (including imaging, microbiology, ancillary and laboratory) are listed below for reference.     Procedures and Diagnostic Studies:   No results found.   Labs:   Basic Metabolic Panel:  Recent Labs Lab 05/15/17 1208 05/16/17 0554 05/17/17 0512  NA 139 140 141  K 3.5 3.4* 3.2*  CL 108 108  106  CO2 GLUCOSE 80 84 106*  BUN 11 7 <5*  CREATININE 0.53 0.48 0.51  CALCIUM 9.0 8.8* 9.2   GFR Estimated Creatinine Clearance: 22.9 mL/min (by C-G formula based on SCr of 0.51 mg/dL). Liver Function Tests:  Recent Labs Lab 05/15/17 1208  AST 29  ALT 18  ALKPHOS 52  BILITOT 0.7  PROT 6.1*  ALBUMIN 3.3*   No results for input(s):  LIPASE, AMYLASE in the last 168 hours. No results for input(s): AMMONIA in the last 168 hours. Coagulation profile No results for input(s): INR, PROTIME in the last 168 hours.  CBC:  Recent Labs Lab 05/15/17 1208 05/15/17 1823 05/16/17 0554 05/17/17 0512  WBC 6.2 7.2 8.0 8.8  NEUTROABS 3.8 4.7  --   --   HGB 12.5 13.1 12.6 14.3  HCT 37.9 39.1 38.0 42.2  MCV 94.8 94.4 93.8 93.2  PLT 232 221 219 223   Cardiac Enzymes: No results for input(s): CKTOTAL, CKMB, CKMBINDEX, TROPONINI in the last 168 hours. BNP: Invalid input(s): POCBNP CBG: No results for input(s): GLUCAP in the last 168 hours. D-Dimer No results for input(s): DDIMER in the last 72 hours. Hgb A1c No results for input(s): HGBA1C in the last 72 hours. Lipid Profile No results for input(s): CHOL, HDL, LDLCALC, TRIG, CHOLHDL, LDLDIRECT in the last 72 hours. Thyroid function studies  Recent Labs  05/15/17 1823  TSH 2.245   Anemia work up  Recent Labs  05/15/17 1823  VITAMINB12 903  FOLATE 15.5  FERRITIN 139  TIBC 235*  IRON 72  RETICCTPCT 0.9   Microbiology No results found for this or any previous visit (from the past 240 hour(s)).   Discharge Instructions:   Discharge Instructions    Diet - low sodium heart healthy    Complete by:  As directed    Discharge instructions    Complete by:  As directed    Laxatives to aim for a complete BM qd-qod   Increase activity slowly    Complete by:  As directed      Allergies as of 05/17/2017   No Known Allergies     Medication List    STOP taking these medications   cephALEXin 500 MG capsule Commonly known as:  KEFLEX     TAKE these medications   acetaminophen 325 MG tablet Commonly known as:  TYLENOL Take 2 tablets (650 mg total) by mouth every 4 (four) hours as needed for mild pain. What changed:  how much to take  when to take this   ALPRAZolam 0.25 MG tablet Commonly known as:  XANAX TAKE 0.25 MG BY MOUTH AT dinner   amLODipine 5  MG tablet Commonly known as:  NORVASC Take 5 mg by mouth daily.   atorvastatin 10 MG tablet Commonly known as:  LIPITOR Take 10 mg by mouth daily at 6 PM.   bisacodyl 5 MG EC tablet Commonly known as:  DULCOLAX Take 1 tablet (5 mg total) by mouth daily as needed for moderate constipation.   clopidogrel 75 MG tablet Commonly known as:  PLAVIX Take 1 tablet (75 mg total) by mouth daily.   docusate sodium 100 MG capsule Commonly known as:  COLACE Take 100 mg by mouth at bedtime as needed for mild constipation.   ferrous sulfate 325 (65 FE) MG tablet Take 325 mg by mouth See admin instructions. Take 1 tablet daily for 7 days then stop for 7 days.  Repeat cycle   ipratropium 0.03 %  nasal spray Commonly known as:  ATROVENT Place 2 sprays into both nostrils 3 (three) times daily as needed.   levothyroxine 25 MCG tablet Commonly known as:  SYNTHROID, LEVOTHROID Take 1 tablet by mouth  daily What changed:  See the new instructions.   meclizine 25 MG tablet Commonly known as:  ANTIVERT Take 1 tablet (25 mg total) by mouth 3 (three) times daily.   Melatonin 10 MG Tabs Take 10 mg by mouth at bedtime.   metoprolol tartrate 50 MG tablet Commonly known as:  LOPRESSOR Take 50 mg by mouth 2 (two) times daily.   ondansetron 8 MG tablet Commonly known as:  ZOFRAN Take 1 tablet (8 mg total) by mouth every 8 (eight) hours as needed for nausea or vomiting.   polyethylene glycol packet Commonly known as:  MIRALAX / GLYCOLAX Take 17 g by mouth at bedtime.   vitamin B-12 1000 MCG tablet Commonly known as:  CYANOCOBALAMIN Take 1,000 mcg by mouth daily.   Vitamin D3 5000 units Caps Take 5,000 Units by mouth daily.      Follow-up Information    Merlene Laughter, MD Follow up.   Specialty:  Internal Medicine Contact information: [address] [place] [address] [phone]            Time coordinating discharge: 35 min  Signed:  Teri Diltz Juanetta Gosling   [facility] 05/17/2017, 2:12 PM

## 2017-05-17 NOTE — Progress Notes (Signed)
Daily Rounding Note  05/17/2017, 9:41 AM  LOS: 1 day   SUBJECTIVE:   Chief complaint: anorexia.    Discussed patient's situation with her daughter, who is at the bedside. The patient has had anorexia for the last few months. She doesn't like to drink liquids and picks at her food. Constipation was not previously a problem. Previously she had daily bowel movements but since her recent admission in mid September with UTI and TIA, she developed constipation and had gone a week without a bowel movement by the time she arrived with hematochezia in the ED.  Patient only drink about 10-12 ounces of the Gatorade/MiraLAX.  She's had some brown stools. No recurrence of bleeding. No nausea or vomiting.  OBJECTIVE:         Vital signs in last 24 hours:    Temp:  [97.5 F (36.4 C)-98.1 F (36.7 C)] 97.9 F (36.6 C) (10/02 0501) Pulse Rate:  [66-89] 69 (10/02 0501) Resp:  [17-20] 18 (10/02 0501) BP: (132-148)/(66-93) 148/93 (10/02 0501) SpO2:  [94 %-97 %] 97 % (10/02 0501) Last BM Date: 05/17/17 Filed Weights   05/15/17 1122 05/15/17 2056  Weight: 44.9 kg (99 lb) 43.6 kg (96 lb 1.6 oz)   General: frail. Does not look acutely ill. Comfortable. Aged.   Heart: regular rate and rhythm with frequent premature beats. Chest: clear bilaterally. No cough or labored breathing. Abdomen: soft, doughy. Mildly tender, so she needs to pee.  Extremities: no CCE. Neuro/Psych:  Fully alert. Confused. Not oriented to place, year.  Intake/Output from previous day: 10/01 0701 - 10/02 0700 In: 1320 [P.O.:420; I.V.:900] Out: 1101 [Urine:1100; Stool:1]  Intake/Output this shift: No intake/output data recorded.  Lab Results:  Recent Labs  05/15/17 1823 05/16/17 0554 05/17/17 0512  WBC 7.2 8.0 8.8  HGB 13.1 12.6 14.3  HCT 39.1 38.0 42.2  PLT 221 219 223   BMET  Recent Labs  05/15/17 1208 05/16/17 0554 05/17/17 0512  NA 139 140 141  K  3.5 3.4* 3.2*  CL 108 108 106  CO2 GLUCOSE 80 84 106*  BUN 11 7 <5*  CREATININE 0.53 0.48 0.51  CALCIUM 9.0 8.8* 9.2   LFT  Recent Labs  05/15/17 1208  PROT 6.1*  ALBUMIN 3.3*  AST 29  ALT 18  ALKPHOS 52  BILITOT 0.7     Studies/Results: No results found.  ASSESMENT:    *  Painless hematochezia.   Diverticular?, established diagnosis of colonic diverticulosis per CT 2 weeks ago.   Hemorrhoids, could be cause Obstipation, fecal impaction so sterdoral ulcer possible.  She appears to have background issue of constipation.   No previous colonoscopy.  R/o neoplasia: though nothing worrisome in this regard on CT 2 weeks ago.    *  Anorexia.  May be secondary to dementia.    *  Hx anemia.  On po iron and po B12 at home. Iron, b12, folate levels of 9/30 are excellent. Hgb levels currently near normal and improved over the course of admission  *  Atrial fibrillation. Not on Coumadin. Not listed as a medical problem but reviewed EKG from 2 weeks ago and this revealed atrial fibrillation as does current EKG.  *  Hypokalemia.  *  Dementia.  Seems more confused today. Recent TIA during admission2 weeks ago.   PLAN   *  Spoke with patient's daughter for about 10 minutes regarding pursuing colonoscopy versus not. Given  that the patient has had such difficulty taking the MiraLAX/Gatorade, patient would probably require NG tube in order to complete a bowel prep. Also the hematochezia has not recurred. Thirdly she is not anemic and her hemoglobin has improved. The daughter would not oppose colonoscopy but wonders if it is really worth pursuing. At this point she is thinking not to pursue a colonoscopy. Therefore will advance patient's diet. Begin pill laxatives given that the patient has an aversion to drinking liquids  Ok to discharge today.  Will start dulcolax 1 po q day but dose can be titrated to be used as needed if she develops significant increase in daily bowel  movements. There is no need for GI follow-up. We are available to see her in the office PRN.   Dr Marina Goodell saw her once in 2015.     [name]  05/17/2017, 9:41 AM Pager: [phone]     Attending physician's note   I have taken an interval history, reviewed the chart and examined the patient. I agree with the Advanced Practitioner's note, impression and recommendations. Hematochezia has not recurred. Hb now 14.3. Pt and daughter are not supportive of proceeding with colonoscopy at this time. Laxatives to aim for a complete BM qd-qod. GI signing off. Follow up post hospital with her PCP. GI follow with Dr. Yancey Flemings prn.   Claudette Head, MD Clementeen Graham [phone] Mon-Fri 8a-5p [phone] after 5p, weekends, holidays

## 2017-05-24 DIAGNOSIS — H53469 Homonymous bilateral field defects, unspecified side: Secondary | ICD-10-CM | POA: Diagnosis not present

## 2017-05-24 DIAGNOSIS — R42 Dizziness and giddiness: Secondary | ICD-10-CM | POA: Diagnosis not present

## 2017-05-24 DIAGNOSIS — I69319 Unspecified symptoms and signs involving cognitive functions following cerebral infarction: Secondary | ICD-10-CM | POA: Diagnosis not present

## 2017-05-24 DIAGNOSIS — F0390 Unspecified dementia without behavioral disturbance: Secondary | ICD-10-CM | POA: Diagnosis not present

## 2017-05-24 DIAGNOSIS — Z79899 Other long term (current) drug therapy: Secondary | ICD-10-CM | POA: Diagnosis not present

## 2017-05-24 DIAGNOSIS — M1990 Unspecified osteoarthritis, unspecified site: Secondary | ICD-10-CM | POA: Diagnosis not present

## 2017-05-24 DIAGNOSIS — I2510 Atherosclerotic heart disease of native coronary artery without angina pectoris: Secondary | ICD-10-CM | POA: Diagnosis not present

## 2017-05-24 DIAGNOSIS — K921 Melena: Secondary | ICD-10-CM | POA: Diagnosis not present

## 2017-05-24 DIAGNOSIS — K5901 Slow transit constipation: Secondary | ICD-10-CM | POA: Diagnosis not present

## 2017-05-26 DIAGNOSIS — I2510 Atherosclerotic heart disease of native coronary artery without angina pectoris: Secondary | ICD-10-CM | POA: Diagnosis not present

## 2017-05-26 DIAGNOSIS — F0390 Unspecified dementia without behavioral disturbance: Secondary | ICD-10-CM | POA: Diagnosis not present

## 2017-05-26 DIAGNOSIS — R42 Dizziness and giddiness: Secondary | ICD-10-CM | POA: Diagnosis not present

## 2017-05-26 DIAGNOSIS — H53469 Homonymous bilateral field defects, unspecified side: Secondary | ICD-10-CM | POA: Diagnosis not present

## 2017-05-26 DIAGNOSIS — M1990 Unspecified osteoarthritis, unspecified site: Secondary | ICD-10-CM | POA: Diagnosis not present

## 2017-05-26 DIAGNOSIS — I69319 Unspecified symptoms and signs involving cognitive functions following cerebral infarction: Secondary | ICD-10-CM | POA: Diagnosis not present

## 2017-05-30 DIAGNOSIS — I2510 Atherosclerotic heart disease of native coronary artery without angina pectoris: Secondary | ICD-10-CM | POA: Diagnosis not present

## 2017-05-30 DIAGNOSIS — F0390 Unspecified dementia without behavioral disturbance: Secondary | ICD-10-CM | POA: Diagnosis not present

## 2017-05-30 DIAGNOSIS — M1990 Unspecified osteoarthritis, unspecified site: Secondary | ICD-10-CM | POA: Diagnosis not present

## 2017-05-30 DIAGNOSIS — H53469 Homonymous bilateral field defects, unspecified side: Secondary | ICD-10-CM | POA: Diagnosis not present

## 2017-05-30 DIAGNOSIS — I69319 Unspecified symptoms and signs involving cognitive functions following cerebral infarction: Secondary | ICD-10-CM | POA: Diagnosis not present

## 2017-05-30 DIAGNOSIS — R42 Dizziness and giddiness: Secondary | ICD-10-CM | POA: Diagnosis not present

## 2017-06-02 DIAGNOSIS — F0390 Unspecified dementia without behavioral disturbance: Secondary | ICD-10-CM | POA: Diagnosis not present

## 2017-06-02 DIAGNOSIS — I69319 Unspecified symptoms and signs involving cognitive functions following cerebral infarction: Secondary | ICD-10-CM | POA: Diagnosis not present

## 2017-06-02 DIAGNOSIS — H53469 Homonymous bilateral field defects, unspecified side: Secondary | ICD-10-CM | POA: Diagnosis not present

## 2017-06-02 DIAGNOSIS — R42 Dizziness and giddiness: Secondary | ICD-10-CM | POA: Diagnosis not present

## 2017-06-02 DIAGNOSIS — M1990 Unspecified osteoarthritis, unspecified site: Secondary | ICD-10-CM | POA: Diagnosis not present

## 2017-06-02 DIAGNOSIS — I2510 Atherosclerotic heart disease of native coronary artery without angina pectoris: Secondary | ICD-10-CM | POA: Diagnosis not present

## 2017-06-03 DIAGNOSIS — H04123 Dry eye syndrome of bilateral lacrimal glands: Secondary | ICD-10-CM | POA: Diagnosis not present

## 2017-06-03 DIAGNOSIS — Z961 Presence of intraocular lens: Secondary | ICD-10-CM | POA: Diagnosis not present

## 2017-06-03 DIAGNOSIS — H35341 Macular cyst, hole, or pseudohole, right eye: Secondary | ICD-10-CM | POA: Diagnosis not present

## 2017-06-03 DIAGNOSIS — H31011 Macula scars of posterior pole (postinflammatory) (post-traumatic), right eye: Secondary | ICD-10-CM | POA: Diagnosis not present

## 2017-06-06 DIAGNOSIS — R42 Dizziness and giddiness: Secondary | ICD-10-CM | POA: Diagnosis not present

## 2017-06-06 DIAGNOSIS — I2510 Atherosclerotic heart disease of native coronary artery without angina pectoris: Secondary | ICD-10-CM | POA: Diagnosis not present

## 2017-06-06 DIAGNOSIS — M1990 Unspecified osteoarthritis, unspecified site: Secondary | ICD-10-CM | POA: Diagnosis not present

## 2017-06-06 DIAGNOSIS — H53469 Homonymous bilateral field defects, unspecified side: Secondary | ICD-10-CM | POA: Diagnosis not present

## 2017-06-06 DIAGNOSIS — I69319 Unspecified symptoms and signs involving cognitive functions following cerebral infarction: Secondary | ICD-10-CM | POA: Diagnosis not present

## 2017-06-06 DIAGNOSIS — F0390 Unspecified dementia without behavioral disturbance: Secondary | ICD-10-CM | POA: Diagnosis not present

## 2017-06-08 DIAGNOSIS — M1990 Unspecified osteoarthritis, unspecified site: Secondary | ICD-10-CM | POA: Diagnosis not present

## 2017-06-08 DIAGNOSIS — R42 Dizziness and giddiness: Secondary | ICD-10-CM | POA: Diagnosis not present

## 2017-06-08 DIAGNOSIS — F0390 Unspecified dementia without behavioral disturbance: Secondary | ICD-10-CM | POA: Diagnosis not present

## 2017-06-08 DIAGNOSIS — H53469 Homonymous bilateral field defects, unspecified side: Secondary | ICD-10-CM | POA: Diagnosis not present

## 2017-06-08 DIAGNOSIS — I2510 Atherosclerotic heart disease of native coronary artery without angina pectoris: Secondary | ICD-10-CM | POA: Diagnosis not present

## 2017-06-08 DIAGNOSIS — I69319 Unspecified symptoms and signs involving cognitive functions following cerebral infarction: Secondary | ICD-10-CM | POA: Diagnosis not present

## 2017-08-11 DIAGNOSIS — R109 Unspecified abdominal pain: Secondary | ICD-10-CM | POA: Diagnosis not present

## 2017-08-11 DIAGNOSIS — R05 Cough: Secondary | ICD-10-CM | POA: Diagnosis not present

## 2017-08-17 ENCOUNTER — Ambulatory Visit (INDEPENDENT_AMBULATORY_CARE_PROVIDER_SITE_OTHER): Payer: Medicare Other

## 2017-08-17 ENCOUNTER — Encounter (HOSPITAL_COMMUNITY): Payer: Self-pay | Admitting: Family Medicine

## 2017-08-17 ENCOUNTER — Ambulatory Visit (HOSPITAL_COMMUNITY)
Admission: EM | Admit: 2017-08-17 | Discharge: 2017-08-17 | Disposition: A | Discharge status: Home / Self Care | Payer: Medicare Other | Attending: Family Medicine | Admitting: Family Medicine

## 2017-08-17 DIAGNOSIS — M545 Low back pain, unspecified: Secondary | ICD-10-CM

## 2017-08-17 DIAGNOSIS — S299XXA Unspecified injury of thorax, initial encounter: Secondary | ICD-10-CM | POA: Diagnosis not present

## 2017-08-17 DIAGNOSIS — M546 Pain in thoracic spine: Secondary | ICD-10-CM | POA: Diagnosis not present

## 2017-08-17 NOTE — Discharge Instructions (Signed)

## 2017-08-17 NOTE — ED Triage Notes (Signed)
Pt here for fall that occurred a few days ago. sts hurting in back and around abdomen. Bruise noted to back. sts having a hard tike breathing. The fall was unwitnessed.

## 2017-08-22 NOTE — ED Provider Notes (Signed)
[facility]   [id number] 08/17/17 Arrival Time: 1901  ASSESSMENT & PLAN:  1. Acute bilateral low back pain without sciatica    No acute abnormalities on imaging this evening. Lungs are clear. No pneumothorax. Tylenol as needed. Appears to be all muscular related. Ensure ROM as tolerated. Will f/u with her PCP if not improving over the next several days.  Reviewed expectations re: course of current medical issues. Questions answered. Outlined signs and symptoms indicating need for more acute intervention. Patient verbalized understanding. After Visit Summary given.   SUBJECTIVE: History from: patient and family. Almeda E Vaden is a 84 y.o. female who presents with complaint of mild low back discomfort. Reports falling on her buttocks a few days ago. Questions if she hit the edge of bed. Onset of discomfort gradual beginning a few hours after fall. Has been ambulatory since. Discomfort described as stiffness without radiation. History of back problems: no. Previous back surgery: no. Discomfort described as stiffness without radiation. Certain movements exacerbate the described discomfort. Better with rest. Extremity sensation changes or weakness: none. Normal bowel/bladder habits. No associated abdominal pain/n/v. Dipinto treatment: has not tried OTCs for relief of pain. No respiratory difficulties.  ROS: As per HPI. All other systems negative.   OBJECTIVE:  Vitals:   08/17/17 2013  BP: (!) 148/83  Pulse: 67  Resp: 18  Temp: 98.6 F (37 C)  SpO2: 96%    General appearance: alert; no distress Abdomen: soft, non-tender; bowel sounds normal; no masses or organomegaly; no guarding or rebound tenderness Back: small areas of bruising over bilateral (R>L) lumbar musculature; FROM at hips; no midline tenderness; slight tenderness over R posterior ribs Lungs: unlabored respirations; overall CTAB Extremities: no cyanosis or edema; symmetrical with no gross deformities Skin: warm  and dry Neurologic: normal gait; normal symmetric reflexes; normal LE strength and sensation Psychological: alert and cooperative; normal mood and affect  Imaging: Dg Chest 2 View  Result Date: 08/17/2017 CLINICAL DATA:  84 year old female fell several days ago. Complaining and mid abdominal pain. Initial encounter. EXAM: CHEST  2 VIEW COMPARISON:  06/26/2016 chest x-ray and 05/05/2017 CT abdomen and pelvis. FINDINGS: Moderate-size hiatal hernia once again noted. Small bilateral pleural effusions. Bibasilar subsegmental atelectasis greater on left. No pneumothorax or pulmonary edema. Mild cardiomegaly. Mitral valve calcification. Coronary artery calcification. Calcified tortuous aorta. Minimal loss height T8 and T9 without significant thoracic body compression fracture. No obvious rib fracture although evaluation limited by mild undulation of the ribs. IMPRESSION: Moderate-size hiatal hernia. Small bilateral pleural effusions. Bibasilar subsegmental atelectasis greater on left. Mild cardiomegaly. Aortic Atherosclerosis (ICD10-I70.0). Minimal loss height T8 and T9 without significant thoracic body compression fracture. Electronically Signed   By: Lacy Duverney M.D.   On: 08/17/2017 20:55    No Known Allergies  Past Medical History:  Diagnosis Date  . Acute myocardial infarction of other anterior wall, episode of care unspecified   . Arthritis   . Coronary artery disease   . Other and unspecified hyperlipidemia   . Personal history of arthritis   . Personal history of malignant neoplasm of breast    s/p lumpectomy  . Stroke [facility]) 03/2015   "left sided issues"  . Unspecified essential hypertension   . Unspecified hypothyroidism    Social History   Socioeconomic History  . Marital status: Widowed    Spouse name: Not on file  . Number of children: 1  . Years of education: 54  . Highest education level: Not on file  Social Needs  .  Financial resource strain: Not on file  . Food insecurity  - worry: Not on file  . Food insecurity - inability: Not on file  . Transportation needs - medical: Not on file  . Transportation needs - non-medical: Not on file  Occupational History  . Occupation: retired  Tobacco Use  . Smoking status: Never Smoker  . Smokeless tobacco: Never Used  Substance and Sexual Activity  . Alcohol use: No  . Drug use: No  . Sexual activity: No  Other Topics Concern  . Not on file  Social History Narrative   HSG. Married '48- 57 yrs/widowed. 1 daughter - '59; 1 grandchild. 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  Lives alone. I-ADLs, manages books, pays bills.. End-of-Life Care: no heroic measures; No CPR, no mechanical ventilation.  Caffeine use/day:  None. Does Patient Exercise:  No       03/07/17 lives with daughter, Nettie ElmSylvia   Family History  Problem Relation Age of Onset  . Colon cancer Mother   . Breast cancer Mother   . Coronary artery disease Brother   . Dementia Sister    Past Surgical History:  Procedure Laterality Date  . BACK SURGERY    . CATARACT EXTRACTION     left-2006, right 2011  . CHOLECYSTECTOMY    . intrathoracic goiter resection    . lumpectomy for breast cancer Left 80's   radiation therapy  . right thyroid lobectomy     Hx Nyoka CowdenGoiter     Leticia Mcdiarmid, MD 08/22/17 [phone]

## 2017-08-25 DIAGNOSIS — R0781 Pleurodynia: Secondary | ICD-10-CM | POA: Diagnosis not present

## 2017-08-25 DIAGNOSIS — I10 Essential (primary) hypertension: Secondary | ICD-10-CM | POA: Diagnosis not present

## 2017-12-21 DIAGNOSIS — Z79899 Other long term (current) drug therapy: Secondary | ICD-10-CM | POA: Diagnosis not present

## 2017-12-21 DIAGNOSIS — Z1389 Encounter for screening for other disorder: Secondary | ICD-10-CM | POA: Diagnosis not present

## 2017-12-21 DIAGNOSIS — E039 Hypothyroidism, unspecified: Secondary | ICD-10-CM | POA: Diagnosis not present

## 2017-12-21 DIAGNOSIS — R413 Other amnesia: Secondary | ICD-10-CM | POA: Diagnosis not present

## 2017-12-21 DIAGNOSIS — I10 Essential (primary) hypertension: Secondary | ICD-10-CM | POA: Diagnosis not present

## 2017-12-21 DIAGNOSIS — Z0000 Encounter for general adult medical examination without abnormal findings: Secondary | ICD-10-CM | POA: Diagnosis not present

## 2017-12-21 DIAGNOSIS — E7800 Pure hypercholesterolemia, unspecified: Secondary | ICD-10-CM | POA: Diagnosis not present

## 2018-04-20 ENCOUNTER — Other Ambulatory Visit: Payer: Self-pay | Admitting: Nurse Practitioner

## 2018-04-20 ENCOUNTER — Ambulatory Visit
Admission: RE | Admit: 2018-04-20 | Discharge: 2018-04-20 | Disposition: A | Discharge status: Home / Self Care | Payer: Medicare Other | Source: Ambulatory Visit | Attending: Nurse Practitioner | Admitting: Nurse Practitioner

## 2018-04-20 DIAGNOSIS — W19XXXA Unspecified fall, initial encounter: Secondary | ICD-10-CM

## 2018-04-20 DIAGNOSIS — R0781 Pleurodynia: Secondary | ICD-10-CM | POA: Diagnosis not present

## 2018-04-20 DIAGNOSIS — S299XXA Unspecified injury of thorax, initial encounter: Secondary | ICD-10-CM | POA: Diagnosis not present

## 2018-08-07 ENCOUNTER — Encounter (HOSPITAL_COMMUNITY): Payer: Self-pay

## 2018-08-07 ENCOUNTER — Emergency Department (HOSPITAL_COMMUNITY)
Admission: EM | Admit: 2018-08-07 | Discharge: 2018-08-08 | Disposition: A | Discharge status: Home / Self Care | Payer: Medicare Other | Source: Home / Self Care | Attending: Emergency Medicine | Admitting: Emergency Medicine

## 2018-08-07 ENCOUNTER — Emergency Department (HOSPITAL_COMMUNITY): Payer: Medicare Other

## 2018-08-07 DIAGNOSIS — R110 Nausea: Secondary | ICD-10-CM | POA: Diagnosis not present

## 2018-08-07 DIAGNOSIS — I10 Essential (primary) hypertension: Secondary | ICD-10-CM

## 2018-08-07 DIAGNOSIS — I2510 Atherosclerotic heart disease of native coronary artery without angina pectoris: Secondary | ICD-10-CM

## 2018-08-07 DIAGNOSIS — F0390 Unspecified dementia without behavioral disturbance: Secondary | ICD-10-CM

## 2018-08-07 DIAGNOSIS — R29898 Other symptoms and signs involving the musculoskeletal system: Secondary | ICD-10-CM

## 2018-08-07 DIAGNOSIS — I69354 Hemiplegia and hemiparesis following cerebral infarction affecting left non-dominant side: Secondary | ICD-10-CM | POA: Diagnosis not present

## 2018-08-07 DIAGNOSIS — R05 Cough: Secondary | ICD-10-CM | POA: Diagnosis not present

## 2018-08-07 DIAGNOSIS — I480 Paroxysmal atrial fibrillation: Secondary | ICD-10-CM | POA: Diagnosis not present

## 2018-08-07 DIAGNOSIS — I472 Ventricular tachycardia: Secondary | ICD-10-CM | POA: Diagnosis not present

## 2018-08-07 DIAGNOSIS — M6281 Muscle weakness (generalized): Secondary | ICD-10-CM | POA: Diagnosis not present

## 2018-08-07 DIAGNOSIS — I493 Ventricular premature depolarization: Secondary | ICD-10-CM | POA: Diagnosis not present

## 2018-08-07 DIAGNOSIS — Z79899 Other long term (current) drug therapy: Secondary | ICD-10-CM

## 2018-08-07 DIAGNOSIS — J984 Other disorders of lung: Secondary | ICD-10-CM | POA: Diagnosis not present

## 2018-08-07 DIAGNOSIS — R404 Transient alteration of awareness: Secondary | ICD-10-CM | POA: Diagnosis not present

## 2018-08-07 DIAGNOSIS — I63411 Cerebral infarction due to embolism of right middle cerebral artery: Secondary | ICD-10-CM | POA: Diagnosis not present

## 2018-08-07 DIAGNOSIS — I639 Cerebral infarction, unspecified: Secondary | ICD-10-CM | POA: Diagnosis not present

## 2018-08-07 DIAGNOSIS — E039 Hypothyroidism, unspecified: Secondary | ICD-10-CM

## 2018-08-07 DIAGNOSIS — R410 Disorientation, unspecified: Secondary | ICD-10-CM | POA: Diagnosis not present

## 2018-08-07 LAB — CBC WITH DIFFERENTIAL/PLATELET
Abs Immature Granulocytes: 0.02 10*3/uL (ref 0.00–0.07)
BASOS ABS: 0 10*3/uL (ref 0.0–0.1)
Basophils Relative: 1 %
EOS ABS: 0.1 10*3/uL (ref 0.0–0.5)
EOS PCT: 2 %
HCT: 38.2 % (ref 36.0–46.0)
HEMOGLOBIN: 12.3 g/dL (ref 12.0–15.0)
Immature Granulocytes: 0 %
LYMPHS PCT: 20 %
Lymphs Abs: 1.3 10*3/uL (ref 0.7–4.0)
MCH: 31.4 pg (ref 26.0–34.0)
MCHC: 32.2 g/dL (ref 30.0–36.0)
MCV: 97.4 fL (ref 80.0–100.0)
MONO ABS: 0.6 10*3/uL (ref 0.1–1.0)
Monocytes Relative: 10 %
NRBC: 0 % (ref 0.0–0.2)
Neutro Abs: 4.3 10*3/uL (ref 1.7–7.7)
Neutrophils Relative %: 67 %
Platelets: 245 10*3/uL (ref 150–400)
RBC: 3.92 MIL/uL (ref 3.87–5.11)
RDW: 12.8 % (ref 11.5–15.5)
WBC: 6.4 10*3/uL (ref 4.0–10.5)

## 2018-08-07 LAB — COMPREHENSIVE METABOLIC PANEL
ALK PHOS: 59 U/L (ref 38–126)
ALT: 16 U/L (ref 0–44)
AST: 28 U/L (ref 15–41)
Albumin: 3.6 g/dL (ref 3.5–5.0)
Anion gap: 11 (ref 5–15)
BILIRUBIN TOTAL: 0.6 mg/dL (ref 0.3–1.2)
BUN: 13 mg/dL (ref 8–23)
CALCIUM: 8.9 mg/dL (ref 8.9–10.3)
CO2: 23 mmol/L (ref 22–32)
CREATININE: 0.73 mg/dL (ref 0.44–1.00)
Chloride: 106 mmol/L (ref 98–111)
Glucose, Bld: 183 mg/dL — ABNORMAL HIGH (ref 70–99)
Potassium: 4 mmol/L (ref 3.5–5.1)
Sodium: 140 mmol/L (ref 135–145)
Total Protein: 6.7 g/dL (ref 6.5–8.1)

## 2018-08-07 LAB — URINALYSIS, ROUTINE W REFLEX MICROSCOPIC
BACTERIA UA: NONE SEEN
Bilirubin Urine: NEGATIVE
Glucose, UA: NEGATIVE mg/dL
Ketones, ur: NEGATIVE mg/dL
NITRITE: NEGATIVE
PH: 7 (ref 5.0–8.0)
Protein, ur: NEGATIVE mg/dL
SPECIFIC GRAVITY, URINE: 1.006 (ref 1.005–1.030)

## 2018-08-07 NOTE — ED Notes (Signed)
Pt. To XRAY via stretcher. 

## 2018-08-07 NOTE — ED Triage Notes (Signed)
Pt via EMS from home with reports of increased confusion. LKW 1730 tonight. Pt. Has hx. Of alzheimer and dementia. Pt. Reports change in sensation on the left side. Pt. Reports change in sensation left upper arm. Pt. Denies any change in feeling at this time. A/O X2

## 2018-08-08 NOTE — ED Provider Notes (Signed)
[facility] Provider Note   CSN: [id number] Arrival date & time: 08/07/18  2014     History   Chief Complaint No chief complaint on file.   HPI Leah Figueroa is a 84 y.o. female.  Patient is a 84 year old female with a history of coronary disease, dementia, hypertension, prior stroke who presents with left arm weakness.  She lives at home with family.  Her caretakers stated that when she was walking with her walker she was having a hard time gripping her walker with her left arm.  She felt that the arm was weaker than the right side.  She did not notice any leg weakness.  The patient has not had any speech deficits.  They have not noted any vision changes but her history is limited due to her dementia.  She has had some increased confusion over the last several weeks but no acute change.  She has not had any recent illnesses including fevers vomiting diarrhea shortness of breath or other recent issues.     Past Medical History:  Diagnosis Date  . Acute myocardial infarction of other anterior wall, episode of care unspecified   . Arthritis   . Coronary artery disease   . Other and unspecified hyperlipidemia   . Personal history of arthritis   . Personal history of malignant neoplasm of breast    s/p lumpectomy  . Stroke [facility](HCC) 03/2015   "left sided issues"  . Unspecified essential hypertension   . Unspecified hypothyroidism     Patient Active Problem List   Diagnosis Date Noted  . Hematochezia 05/15/2017  . Coronary artery disease 05/15/2017  . Other and unspecified hyperlipidemia 05/15/2017  . Hypothyroidism 05/15/2017  . HTN (hypertension) 05/15/2017  . TIA (transient ischemic attack) 05/02/2017  . History of stroke 03/07/2017  . Gait abnormality 03/07/2017  . Subacromial impingement of left shoulder 02/09/2016  . Hemiparesis affecting left side as late effect of stroke (HCC) 01/20/2016  . Vertigo 09/03/2015  . Dehydration  09/03/2015  . Acute lower UTI 09/03/2015  . Prolonged Q-T interval on ECG 09/03/2015  . Left-sided neglect 07/01/2015  . History of right MCA stroke 07/01/2015  . Altered sensation due to stroke 04/29/2015  . Cognitive deficit, post-stroke 04/29/2015  . Pseudogout of left wrist 03/28/2015  . Hyperlipidemia LDL goal <70 03/26/2015  . Hyperglycemia 03/26/2015  . 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 Embolic stroke (HCC)   . Left hemiplegia (HCC)   . Homonymous hemianopia   . Chest wall trauma 01/11/2014  . Injury of left lower arm 01/11/2014  . Headache(784.0) 01/04/2013  . Allergic rhinitis, cause unspecified 01/04/2013  . Anemia 04/19/2012  . Epistaxis, recurrent 04/19/2012  . Hypothyroidism 01/01/2008  . Essential hypertension 01/01/2008  . HYPERTENSION 01/01/2008  . MYOCARDIAL INFARCTION, ACUTE, ANTERIOR WALL 01/01/2008  . LEG CRAMPS 01/01/2008  . ADENOCARCINOMA, BREAST, HX OF 01/01/2008  . DIVERTICULOSIS, COLON, HX OF 01/01/2008  . ARTHRITIS, HX OF 01/01/2008  . RADIATION THERAPY, HX OF 01/01/2008  . CATARACT EXTRACTION, HX OF 01/01/2008  . CHOLECYSTECTOMY, LAPAROSCOPIC, HX OF 01/01/2008    Past Surgical History:  Procedure Laterality Date  . BACK SURGERY    . CATARACT EXTRACTION     left-2006, right 2011  . CHOLECYSTECTOMY    . intrathoracic goiter resection    . lumpectomy for breast cancer Left 80's   radiation therapy  . right thyroid lobectomy     Hx Goiter  OB History   No obstetric history on file.      Home Medications    Prior to Admission medications   Medication Sig Start Date End Date Taking? Authorizing Provider  ALPRAZolam (XANAX) 0.25 MG tablet Take 0.25 mg by mouth at bedtime.  08/31/16  Yes [provider]  amLODipine (NORVASC) 5 MG tablet Take 5 mg by mouth daily. 08/23/15  Yes [provider]  atorvastatin (LIPITOR) 10 MG tablet Take 10 mg by mouth at bedtime.    Yes [provider]  clopidogrel (PLAVIX) 75 MG tablet Take 1 tablet (75 mg total) by mouth daily. 04/07/15  Yes Love, Evlyn KannerPamela S, PA-C  diphenhydramine-acetaminophen (TYLENOL PM) 25-500 MG TABS tablet Take 1 tablet by mouth at bedtime.   Yes [provider]  docusate sodium (COLACE) 100 MG capsule Take 100 mg by mouth at bedtime.    Yes [provider]  ferrous sulfate 325 (65 FE) MG tablet Take 325 mg by mouth at bedtime.    Yes [provider]  levothyroxine (SYNTHROID, LEVOTHROID) 25 MCG tablet Take 1 tablet by mouth  daily Patient taking differently: Take 25 mcg by mouth daily.  12/25/14  Yes Plotnikov, Georgina QuintAleksei V, MD  Melatonin 10 MG TABS Take 10 mg by mouth at bedtime.    Yes [provider]  metoprolol (LOPRESSOR) 50 MG tablet Take 50 mg by mouth 2 (two) times daily. 08/30/15  Yes [provider]  acetaminophen (TYLENOL) 325 MG tablet Take 2 tablets (650 mg total) by mouth every 4 (four) hours as needed for mild pain. Patient not taking: Reported on 08/07/2018 04/07/15   Jacquelynn CreeLove, Pamela S, PA-C  bisacodyl (DULCOLAX) 5 MG EC tablet Take 1 tablet (5 mg total) by mouth daily as needed for moderate constipation. Patient not taking: Reported on 08/07/2018 05/17/17   Joseph ArtVann, Jessica U, DO  meclizine (ANTIVERT) 25 MG tablet Take 1 tablet (25 mg total) by mouth 3 (three) times daily. Patient not taking: Reported on 08/07/2018 05/05/17   Tyrone NineGrunz, Ryan B, MD  ondansetron (ZOFRAN) 8 MG tablet Take 1 tablet (8 mg total) by mouth every 8 (eight) hours as needed for nausea or vomiting. Patient not taking: Reported on 08/07/2018 05/04/17   Tyrone NineGrunz, Ryan B, MD  polyethylene glycol [facility](MIRALAX / Ethelene HalGLYCOLAX) packet Take 17 g by mouth at bedtime. Patient not taking: Reported on 08/07/2018 05/17/17   Joseph ArtVann, Jessica U, DO    Family History Family History  Problem Relation Age of Onset  . Colon cancer Mother   . Breast cancer Mother   . Coronary artery disease Brother   . Dementia Sister       Social History Social History   Tobacco Use  . Smoking status: Never Smoker  . Smokeless tobacco: Never Used  Substance Use Topics  . Alcohol use: No  . Drug use: No     Allergies   Patient has no known allergies.   Review of Systems Review of Systems  Unable to perform ROS: Dementia     Physical Exam Updated Vital Signs BP 132/77   Pulse (!) 57   Temp (!) 97.3 F (36.3 C) (Oral)   Resp 16   SpO2 96%   Physical Exam Constitutional:      Appearance: She is well-developed.  HENT:     Head: Normocephalic and atraumatic.  Eyes:     Pupils: Pupils are equal, round, and reactive to light.  Neck:     Musculoskeletal: Normal range of motion  and neck supple.  Cardiovascular:     Rate and Rhythm: Normal rate and regular rhythm.     Heart sounds: Normal heart sounds.  Pulmonary:     Effort: Pulmonary effort is normal. No respiratory distress.     Breath sounds: Normal breath sounds. No wheezing or rales.  Chest:     Chest wall: No tenderness.  Abdominal:     General: Bowel sounds are normal.     Palpations: Abdomen is soft.     Tenderness: There is no abdominal tenderness. There is no guarding or rebound.  Musculoskeletal: Normal range of motion.  Lymphadenopathy:     Cervical: No cervical adenopathy.  Skin:    General: Skin is warm and dry.     Findings: No rash.  Neurological:     Mental Status: She is alert.     Comments: Patient wants alert and answers questions.  She is very pleasant but is only oriented to person she has a difficult time following commands.  She seems to have symmetric grip strengths although there is a question of the left side arm drift but her cooperation is challenging.  She has no obvious deficits in her legs.  No obvious facial drooping.  I do not appreciate any visual field deficits although it is difficult to assess because she is not very cooperative with exam she seems to have normal sensation in all extremities to light touch.       ED Treatments / Results  Labs (all labs ordered are listed, but only abnormal results are displayed) Labs Reviewed  COMPREHENSIVE METABOLIC PANEL - Abnormal; Notable for the following components:      Result Value   Glucose, Bld 183 (*)    All other components within normal limits  URINALYSIS, ROUTINE W REFLEX MICROSCOPIC - Abnormal; Notable for the following components:   Color, Urine STRAW (*)    Hgb urine dipstick SMALL (*)    Leukocytes, UA SMALL (*)    All other components within normal limits  URINE CULTURE  CBC WITH DIFFERENTIAL/PLATELET    EKG EKG Interpretation  Date/Time:  Monday August 07 2018 20:24:08 EST Ventricular Rate:  54 PR Interval:    QRS Duration: 103 QT Interval:  523 QTC Calculation: 496 R Axis:   53 Text Interpretation:  Sinus rhythm Low voltage, extremity leads Probable anteroseptal infarct, old Baseline wander in lead(s) V3 since last tracing no significant change Confirmed by Rolan Bucco [phone]) on 08/07/2018 9:11:33 PM   Radiology Dg Chest 2 View  Result Date: 08/07/2018 CLINICAL DATA:  Cough EXAM: CHEST - 2 VIEW COMPARISON:  08/17/2017 FINDINGS: There is calcific aortic atherosclerosis and bulky valvular calcification. No focal airspace consolidation or pulmonary edema. Medium-sized hiatal hernia. IMPRESSION: 1. No active cardiopulmonary disease. 2. Medium-sized hiatal hernia. Electronically Signed   By: Deatra Robinson M.D.   On: 08/07/2018 21:43   Ct Head Wo Contrast  Result Date: 08/07/2018 CLINICAL DATA:  Increase in confusion EXAM: CT HEAD WITHOUT CONTRAST TECHNIQUE: Contiguous axial images were obtained from the base of the skull through the vertex without intravenous contrast. COMPARISON:  05/03/2017 MRI, 917 18 head CT FINDINGS: Brain: Chronic involutional changes of the brain with sulcal and ventricular prominence. Moderate degree of chronic microvascular ischemia of the periventricular and subcortical white matter. Chronic  right posterior parietal and right cerebellar infarcts. No acute intracranial hemorrhage, midline shift or edema. No acute large vascular territory infarct. No intra-axial mass nor extra-axial fluid. Midline fourth ventricle and basal cisterns.  Vascular: Atherosclerosis at the skull base. No hyperdense vessel sign. Skull: Intact Sinuses/Orbits: Bilateral lens replacements. Clear paranasal sinuses. Other: None IMPRESSION: 1. Chronic moderate small vessel ischemic disease of periventricular and subcortical white matter. 2. Chronic right posterior parietal and right cerebellar infarcts. 3. No acute intracranial abnormality. Electronically Signed   By: Tollie Eth M.D.   On: 08/07/2018 22:07    Procedures Procedures (including critical care time)  Medications Ordered in ED Medications - No data to display   Initial Impression / Assessment and Plan / ED Course  I have reviewed the triage vital signs and the nursing notes.  Pertinent labs & imaging results that were available during my care of the patient were reviewed by me and considered in my medical decision making (see chart for details).     Patient is a 84 year old female with a history of dementia who presents with a possible stroke.  The family is concerned about the left arm weakness.  I do not appreciate a significant deficit on exam although her exam is limited due to her cooperation.  Her head CT does not show any acute abnormalities.  Her labs are non-concerning.  Her urine does not appear consistent with infection.  I discussed her findings with neurology, Dr. Otelia Limes, regarding the need for an MRI or further stroke evaluation.  His feeling is that even if the MRI is positive, she likely would not be a candidate for further stroke intervention.  She  is already on Plavix.  She would not be a good candidate for anticoagulants given her fall risk.  I had a long discussion with the patient's daughter and the caregiver and did offer the option  of doing an MRI.  They state that patient is getting agitated and wants to go home.  They are comfortable sending her home even given the chance that she could have had a stroke or TIA.  They will follow-up with her PCP.  I feel this is appropriate.  There were given return precautions.  Final Clinical Impressions(s) / ED Diagnoses   Final diagnoses:  Left arm weakness    ED Discharge Orders    None       Rolan Bucco, MD 08/08/18 0008

## 2018-08-09 LAB — URINE CULTURE

## 2018-08-10 DIAGNOSIS — I69359 Hemiplegia and hemiparesis following cerebral infarction affecting unspecified side: Secondary | ICD-10-CM | POA: Diagnosis not present

## 2018-08-10 DIAGNOSIS — R7309 Other abnormal glucose: Secondary | ICD-10-CM | POA: Diagnosis not present

## 2018-08-10 DIAGNOSIS — E039 Hypothyroidism, unspecified: Secondary | ICD-10-CM | POA: Diagnosis not present

## 2018-08-10 DIAGNOSIS — G459 Transient cerebral ischemic attack, unspecified: Secondary | ICD-10-CM | POA: Diagnosis not present

## 2018-08-10 DIAGNOSIS — H543 Unqualified visual loss, both eyes: Secondary | ICD-10-CM | POA: Diagnosis not present

## 2018-08-11 ENCOUNTER — Inpatient Hospital Stay (HOSPITAL_COMMUNITY): Payer: Medicare Other

## 2018-08-11 ENCOUNTER — Emergency Department (HOSPITAL_COMMUNITY): Payer: Medicare Other

## 2018-08-11 ENCOUNTER — Other Ambulatory Visit: Payer: Self-pay

## 2018-08-11 ENCOUNTER — Encounter (HOSPITAL_COMMUNITY): Payer: Self-pay | Admitting: Emergency Medicine

## 2018-08-11 ENCOUNTER — Inpatient Hospital Stay (HOSPITAL_COMMUNITY)
Admission: EM | Admit: 2018-08-11 | Discharge: 2018-08-15 | DRG: 065 | Disposition: A | Discharge status: Rehabilitation Facility | Payer: Medicare Other | Attending: Internal Medicine | Admitting: Internal Medicine

## 2018-08-11 DIAGNOSIS — X58XXXA Exposure to other specified factors, initial encounter: Secondary | ICD-10-CM | POA: Diagnosis present

## 2018-08-11 DIAGNOSIS — R109 Unspecified abdominal pain: Secondary | ICD-10-CM | POA: Diagnosis present

## 2018-08-11 DIAGNOSIS — Z8249 Family history of ischemic heart disease and other diseases of the circulatory system: Secondary | ICD-10-CM

## 2018-08-11 DIAGNOSIS — I63511 Cerebral infarction due to unspecified occlusion or stenosis of right middle cerebral artery: Secondary | ICD-10-CM | POA: Diagnosis not present

## 2018-08-11 DIAGNOSIS — I5189 Other ill-defined heart diseases: Secondary | ICD-10-CM | POA: Diagnosis not present

## 2018-08-11 DIAGNOSIS — Z7982 Long term (current) use of aspirin: Secondary | ICD-10-CM

## 2018-08-11 DIAGNOSIS — Z800 Family history of malignant neoplasm of digestive organs: Secondary | ICD-10-CM

## 2018-08-11 DIAGNOSIS — Z9841 Cataract extraction status, right eye: Secondary | ICD-10-CM | POA: Diagnosis not present

## 2018-08-11 DIAGNOSIS — S80812A Abrasion, left lower leg, initial encounter: Secondary | ICD-10-CM | POA: Diagnosis present

## 2018-08-11 DIAGNOSIS — I2510 Atherosclerotic heart disease of native coronary artery without angina pectoris: Secondary | ICD-10-CM | POA: Diagnosis present

## 2018-08-11 DIAGNOSIS — Z79899 Other long term (current) drug therapy: Secondary | ICD-10-CM

## 2018-08-11 DIAGNOSIS — Z79890 Hormone replacement therapy (postmenopausal): Secondary | ICD-10-CM

## 2018-08-11 DIAGNOSIS — Z66 Do not resuscitate: Secondary | ICD-10-CM | POA: Diagnosis present

## 2018-08-11 DIAGNOSIS — E860 Dehydration: Secondary | ICD-10-CM | POA: Diagnosis not present

## 2018-08-11 DIAGNOSIS — Z7902 Long term (current) use of antithrombotics/antiplatelets: Secondary | ICD-10-CM | POA: Diagnosis not present

## 2018-08-11 DIAGNOSIS — M1990 Unspecified osteoarthritis, unspecified site: Secondary | ICD-10-CM | POA: Diagnosis present

## 2018-08-11 DIAGNOSIS — I10 Essential (primary) hypertension: Secondary | ICD-10-CM | POA: Diagnosis present

## 2018-08-11 DIAGNOSIS — L8990 Pressure ulcer of unspecified site, unspecified stage: Secondary | ICD-10-CM

## 2018-08-11 DIAGNOSIS — I639 Cerebral infarction, unspecified: Secondary | ICD-10-CM | POA: Diagnosis not present

## 2018-08-11 DIAGNOSIS — F0390 Unspecified dementia without behavioral disturbance: Secondary | ICD-10-CM | POA: Diagnosis present

## 2018-08-11 DIAGNOSIS — Z803 Family history of malignant neoplasm of breast: Secondary | ICD-10-CM | POA: Diagnosis not present

## 2018-08-11 DIAGNOSIS — Z923 Personal history of irradiation: Secondary | ICD-10-CM

## 2018-08-11 DIAGNOSIS — R0682 Tachypnea, not elsewhere classified: Secondary | ICD-10-CM | POA: Diagnosis not present

## 2018-08-11 DIAGNOSIS — R531 Weakness: Secondary | ICD-10-CM | POA: Diagnosis not present

## 2018-08-11 DIAGNOSIS — I472 Ventricular tachycardia: Secondary | ICD-10-CM | POA: Diagnosis present

## 2018-08-11 DIAGNOSIS — Z853 Personal history of malignant neoplasm of breast: Secondary | ICD-10-CM | POA: Diagnosis not present

## 2018-08-11 DIAGNOSIS — E039 Hypothyroidism, unspecified: Secondary | ICD-10-CM | POA: Diagnosis present

## 2018-08-11 DIAGNOSIS — I252 Old myocardial infarction: Secondary | ICD-10-CM | POA: Diagnosis not present

## 2018-08-11 DIAGNOSIS — R42 Dizziness and giddiness: Secondary | ICD-10-CM | POA: Diagnosis not present

## 2018-08-11 DIAGNOSIS — K5909 Other constipation: Secondary | ICD-10-CM | POA: Diagnosis present

## 2018-08-11 DIAGNOSIS — I6523 Occlusion and stenosis of bilateral carotid arteries: Secondary | ICD-10-CM | POA: Diagnosis present

## 2018-08-11 DIAGNOSIS — K5900 Constipation, unspecified: Secondary | ICD-10-CM | POA: Diagnosis present

## 2018-08-11 DIAGNOSIS — K5901 Slow transit constipation: Secondary | ICD-10-CM | POA: Diagnosis not present

## 2018-08-11 DIAGNOSIS — Z9049 Acquired absence of other specified parts of digestive tract: Secondary | ICD-10-CM | POA: Diagnosis not present

## 2018-08-11 DIAGNOSIS — I493 Ventricular premature depolarization: Secondary | ICD-10-CM | POA: Diagnosis present

## 2018-08-11 DIAGNOSIS — Z8673 Personal history of transient ischemic attack (TIA), and cerebral infarction without residual deficits: Secondary | ICD-10-CM

## 2018-08-11 DIAGNOSIS — I480 Paroxysmal atrial fibrillation: Secondary | ICD-10-CM | POA: Diagnosis present

## 2018-08-11 DIAGNOSIS — I63411 Cerebral infarction due to embolism of right middle cerebral artery: Secondary | ICD-10-CM | POA: Diagnosis present

## 2018-08-11 DIAGNOSIS — Z9842 Cataract extraction status, left eye: Secondary | ICD-10-CM | POA: Diagnosis not present

## 2018-08-11 DIAGNOSIS — I69354 Hemiplegia and hemiparesis following cerebral infarction affecting left non-dominant side: Secondary | ICD-10-CM | POA: Diagnosis not present

## 2018-08-11 DIAGNOSIS — H9193 Unspecified hearing loss, bilateral: Secondary | ICD-10-CM | POA: Diagnosis not present

## 2018-08-11 DIAGNOSIS — H9190 Unspecified hearing loss, unspecified ear: Secondary | ICD-10-CM | POA: Diagnosis present

## 2018-08-11 DIAGNOSIS — I361 Nonrheumatic tricuspid (valve) insufficiency: Secondary | ICD-10-CM | POA: Diagnosis not present

## 2018-08-11 DIAGNOSIS — E785 Hyperlipidemia, unspecified: Secondary | ICD-10-CM | POA: Diagnosis present

## 2018-08-11 DIAGNOSIS — J984 Other disorders of lung: Secondary | ICD-10-CM | POA: Diagnosis not present

## 2018-08-11 DIAGNOSIS — E876 Hypokalemia: Secondary | ICD-10-CM | POA: Diagnosis present

## 2018-08-11 DIAGNOSIS — R110 Nausea: Secondary | ICD-10-CM | POA: Diagnosis present

## 2018-08-11 DIAGNOSIS — I340 Nonrheumatic mitral (valve) insufficiency: Secondary | ICD-10-CM | POA: Diagnosis not present

## 2018-08-11 DIAGNOSIS — E46 Unspecified protein-calorie malnutrition: Secondary | ICD-10-CM | POA: Diagnosis not present

## 2018-08-11 DIAGNOSIS — G8929 Other chronic pain: Secondary | ICD-10-CM | POA: Diagnosis present

## 2018-08-11 DIAGNOSIS — L910 Hypertrophic scar: Secondary | ICD-10-CM | POA: Diagnosis present

## 2018-08-11 DIAGNOSIS — R414 Neurologic neglect syndrome: Secondary | ICD-10-CM | POA: Diagnosis not present

## 2018-08-11 DIAGNOSIS — E8809 Other disorders of plasma-protein metabolism, not elsewhere classified: Secondary | ICD-10-CM | POA: Diagnosis present

## 2018-08-11 LAB — COMPREHENSIVE METABOLIC PANEL
ALBUMIN: 4.1 g/dL (ref 3.5–5.0)
ALK PHOS: 63 U/L (ref 38–126)
ALT: 16 U/L (ref 0–44)
AST: 28 U/L (ref 15–41)
Anion gap: 14 (ref 5–15)
BILIRUBIN TOTAL: 1.5 mg/dL — AB (ref 0.3–1.2)
BUN: 13 mg/dL (ref 8–23)
CALCIUM: 9.1 mg/dL (ref 8.9–10.3)
CO2: 22 mmol/L (ref 22–32)
Chloride: 104 mmol/L (ref 98–111)
Creatinine, Ser: 0.59 mg/dL (ref 0.44–1.00)
GFR calc Af Amer: >60 mL/min (ref >60)
GLUCOSE: 133 mg/dL — AB (ref 70–99)
Potassium: 3.8 mmol/L (ref 3.5–5.1)
Sodium: 140 mmol/L (ref 135–145)
Total Protein: 7.8 g/dL (ref 6.5–8.1)

## 2018-08-11 LAB — URINALYSIS, ROUTINE W REFLEX MICROSCOPIC
BILIRUBIN URINE: NEGATIVE
Bacteria, UA: NONE SEEN
Glucose, UA: NEGATIVE mg/dL
Ketones, ur: 80 mg/dL — AB
LEUKOCYTES UA: NEGATIVE
Nitrite: NEGATIVE
PH: 5 (ref 5.0–8.0)
Protein, ur: NEGATIVE mg/dL
SPECIFIC GRAVITY, URINE: 1.017 (ref 1.005–1.030)

## 2018-08-11 LAB — CBC
HEMATOCRIT: 42.1 % (ref 36.0–46.0)
HEMOGLOBIN: 14 g/dL (ref 12.0–15.0)
MCH: 31.8 pg (ref 26.0–34.0)
MCHC: 33.3 g/dL (ref 30.0–36.0)
MCV: 95.7 fL (ref 80.0–100.0)
NRBC: 0 % (ref 0.0–0.2)
Platelets: 243 10*3/uL (ref 150–400)
RBC: 4.4 MIL/uL (ref 3.87–5.11)
RDW: 12.7 % (ref 11.5–15.5)
WBC: 8.2 10*3/uL (ref 4.0–10.5)

## 2018-08-11 MED ORDER — ACETAMINOPHEN 160 MG/5ML PO SOLN: 650.0000 mg | ORAL | Status: DC | PRN

## 2018-08-11 MED ORDER — ASPIRIN 300 MG RE SUPP: 300.0000 mg | Freq: Every day | RECTAL | Status: DC

## 2018-08-11 MED ORDER — ATORVASTATIN CALCIUM 10 MG PO TABS
10.0000 mg | ORAL_TABLET | Freq: Every day | ORAL | Status: DC
  Administered 2018-08-12: 10 mg via ORAL
  Filled 2018-08-11: qty 1

## 2018-08-11 MED ORDER — SODIUM CHLORIDE 0.9 % IV BOLUS
500.0000 mL | Freq: Once | INTRAVENOUS | Status: AC
  Administered 2018-08-11: 500 mL via INTRAVENOUS

## 2018-08-11 MED ORDER — ENOXAPARIN SODIUM 40 MG/0.4ML ~~LOC~~ SOLN
40.0000 mg | SUBCUTANEOUS | Status: DC
  Administered 2018-08-12 – 2018-08-13 (×2): 40 mg via SUBCUTANEOUS
  Filled 2018-08-11 (×2): qty 0.4

## 2018-08-11 MED ORDER — SENNOSIDES-DOCUSATE SODIUM 8.6-50 MG PO TABS: 1.0000 | ORAL_TABLET | Freq: Every evening | ORAL | Status: DC | PRN

## 2018-08-11 MED ORDER — STROKE: EARLY STAGES OF RECOVERY BOOK
Freq: Once | Status: AC
  Administered 2018-08-12: 01:00:00
  Filled 2018-08-11: qty 1

## 2018-08-11 MED ORDER — SODIUM CHLORIDE 0.9 % IV SOLN
INTRAVENOUS | Status: DC
  Administered 2018-08-12: 01:00:00 via INTRAVENOUS

## 2018-08-11 MED ORDER — LEVOTHYROXINE SODIUM 100 MCG/5ML IV SOLN
12.5000 ug | Freq: Every day | INTRAVENOUS | Status: DC
  Administered 2018-08-12: 12.5 ug via INTRAVENOUS
  Filled 2018-08-11: qty 5

## 2018-08-11 MED ORDER — ACETAMINOPHEN 325 MG PO TABS: 650.0000 mg | ORAL_TABLET | ORAL | Status: DC | PRN

## 2018-08-11 MED ORDER — ACETAMINOPHEN 650 MG RE SUPP: 650.0000 mg | RECTAL | Status: DC | PRN

## 2018-08-11 MED ORDER — ALPRAZOLAM 0.25 MG PO TABS
0.2500 mg | ORAL_TABLET | Freq: Every day | ORAL | Status: DC
  Administered 2018-08-12 – 2018-08-14 (×3): 0.25 mg via ORAL
  Filled 2018-08-11 (×3): qty 1

## 2018-08-11 MED ORDER — ASPIRIN 325 MG PO TABS: 325.0000 mg | ORAL_TABLET | Freq: Every day | ORAL | Status: DC

## 2018-08-11 NOTE — H&P (Signed)
History and Physical    Ceriah Kohler Figueroa ZOX:[id number] DOB: 16-Mar-1923 DOA: 08/11/2018  PCP: Leah Laughter, MD  Patient coming from: Home  I have personally briefly reviewed patient's old medical records in [facility]  Chief Complaint: Left-sided weakness, confusion, dizziness  HPI: Leah Figueroa is a 84 y.o. female with medical history significant for hypertension, CAD, Hx of right MCA stroke, hypothyroidism, and dementia who presents the ED with left-sided weakness, confusion, and dizziness.  Patient initially presented to the [facility], ED on 08/07/2018 for left arm weakness.  CT head at that time showed chronic moderate small vessel ischemic disease and chronic right posterior parietal and right cerebellar infarcts without acute infarct.  The case was discussed with on-call neurology and did not consider she would be a good candidate for further stroke intervention even if MRI imaging was obtained and showed an acute infarct.  The options for further evaluation or discussed with the patient's daughter who elected to return home even if she could have had a stroke or TIA.  Since then she has become increasingly weak on her left side with worsening dizziness and confusion.  She has had poor oral intake over the last 2 days.  ED Course:  Initial vitals showed BP 134/91, pulse 84, RR 18, temp 98.3 Fahrenheit, SPO2 96% on room air.  CBC and CMP were largely unremarkable.  CT head without contrast showed a late acute to early subacute right MCA territory infarct with loss of gray-white matter distinction involving the right temporal and parietal lobes.  The EDP, Dr. Rosalia Hammers, discussed the case with on-call neurology, Dr. Otelia Limes, who recommended admission and transfer to [facility] for stroke evaluation.  Review of Systems: As per HPI otherwise 10 point review of systems negative.    Past Medical History:  Diagnosis Date  . Acute myocardial infarction of other anterior wall, episode of  care unspecified   . Arthritis   . Coronary artery disease   . Other and unspecified hyperlipidemia   . Personal history of arthritis   . Personal history of malignant neoplasm of breast    s/p lumpectomy  . Stroke [facility]) 03/2015   "left sided issues"  . Unspecified essential hypertension   . Unspecified hypothyroidism     Past Surgical History:  Procedure Laterality Date  . BACK SURGERY    . CATARACT EXTRACTION     left-2006, right 2011  . CHOLECYSTECTOMY    . intrathoracic goiter resection    . lumpectomy for breast cancer Left 80's   radiation therapy  . right thyroid lobectomy     Hx Goiter     reports that she has never smoked. She has never used smokeless tobacco. She reports that she does not drink alcohol or use drugs.  No Known Allergies  Family History  Problem Relation Age of Onset  . Colon cancer Mother   . Breast cancer Mother   . Coronary artery disease Brother   . Dementia Sister      Prior to Admission medications   Medication Sig Start Date End Date Taking? Authorizing Provider  acetaminophen (TYLENOL) 325 MG tablet Take 2 tablets (650 mg total) by mouth every 4 (four) hours as needed for mild pain. Patient not taking: Reported on 08/07/2018 04/07/15   Jacquelynn Cree, PA-C  ALPRAZolam Prudy Feeler) 0.25 MG tablet Take 0.25 mg by mouth at bedtime.  08/31/16   [provider]  amLODipine (NORVASC) 5 MG tablet Take 5 mg by mouth daily.  08/23/15   [provider]  atorvastatin (LIPITOR) 10 MG tablet Take 10 mg by mouth at bedtime.     [provider]  bisacodyl (DULCOLAX) 5 MG EC tablet Take 1 tablet (5 mg total) by mouth daily as needed for moderate constipation. Patient not taking: Reported on 08/07/2018 05/17/17   Joseph Art, DO  clopidogrel (PLAVIX) 75 MG tablet Take 1 tablet (75 mg total) by mouth daily. 04/07/15   Love, Evlyn Kanner, PA-C  diphenhydramine-acetaminophen (TYLENOL PM) 25-500 MG TABS tablet Take 1 tablet by mouth at  bedtime.    [provider]  docusate sodium (COLACE) 100 MG capsule Take 100 mg by mouth at bedtime.     [provider]  ferrous sulfate 325 (65 FE) MG tablet Take 325 mg by mouth at bedtime.     [provider]  levothyroxine (SYNTHROID, LEVOTHROID) 25 MCG tablet Take 1 tablet by mouth  daily Patient taking differently: Take 25 mcg by mouth daily.  12/25/14   Plotnikov, Georgina Quint, MD  meclizine (ANTIVERT) 25 MG tablet Take 1 tablet (25 mg total) by mouth 3 (three) times daily. Patient not taking: Reported on 08/07/2018 05/05/17   Tyrone Nine, MD  Melatonin 10 MG TABS Take 10 mg by mouth at bedtime.     [provider]  metoprolol (LOPRESSOR) 50 MG tablet Take 50 mg by mouth 2 (two) times daily. 08/30/15   [provider]  ondansetron (ZOFRAN) 8 MG tablet Take 1 tablet (8 mg total) by mouth every 8 (eight) hours as needed for nausea or vomiting. Patient not taking: Reported on 08/07/2018 05/04/17   Tyrone Nine, MD  polyethylene glycol [facility] Ethelene Hal) packet Take 17 g by mouth at bedtime. Patient not taking: Reported on 08/07/2018 05/17/17   Joseph Art, DO    Physical Exam: Exam limited due to patient cooperation Vitals:   08/11/18 2230 08/11/18 2245 08/11/18 2300 08/12/18 0035  BP: 137/69  133/77 (!) 144/81  Pulse: 88  87 78  Resp: 18  (!) 22 (!) 22  Temp:  98.3 F (36.8 C)    TempSrc:  Oral    SpO2: 99%  97% 96%    Constitutional: Elderly woman lying flat in bed, NAD, calm, comfortable Eyes: PERRL, lids and conjunctivae normal, formal extraocular movement testing limited due to patient cooperation ENMT: Mucous membranes are dry. Posterior pharynx clear of any exudate or lesions.Normal dentition.  Neck: normal, supple, no masses. Respiratory: clear to auscultation bilaterally, no wheezing, no crackles. Normal respiratory effort. No accessory muscle use.  Cardiovascular: Regular rate and rhythm, systolic murmur loudest at the left  lower sternal border. No extremity edema.  Abdomen: Mild tenderness left side of the abdomen, no masses palpated. No hepatosplenomegaly. Bowel sounds positive.  Musculoskeletal: no clubbing / cyanosis. No joint deformity upper and lower extremities. Good ROM, no contractures. Normal muscle tone.  Skin: no rashes, lesions, ulcers. No induration Neurologic: CN 2-12 grossly intact. Sensation intact right side, decreased left side. DTR normal. Strength 5/5 right upper and lower extremities, 4/5 left upper and lower extremities.  Grip strength decreased left side. Psychiatric: Alert and oriented to person but not place or time. Normal mood.     Labs on Admission: I have personally reviewed following labs and imaging studies  CBC: Recent Labs  Lab 08/07/18 2113 08/11/18 1826  WBC 6.4 8.2  NEUTROABS 4.3  --   HGB 12.3 14.0  HCT 38.2 42.1  MCV 97.4 95.7  PLT 245 243   Basic Metabolic Panel: Recent Labs  Lab 08/07/18 2113 08/11/18 1826  NA 140 140  K 4.0 3.8  CL 106 104  CO2 23 22  GLUCOSE 183* 133*  BUN 13 13  CREATININE 0.73 0.59  CALCIUM 8.9 9.1   GFR: CrCl cannot be calculated (Unknown ideal weight.). Liver Function Tests: Recent Labs  Lab 08/07/18 2113 08/11/18 1826  AST 28 28  ALT 16 16  ALKPHOS 59 63  BILITOT 0.6 1.5*  PROT 6.7 7.8  ALBUMIN 3.6 4.1   No results for input(s): LIPASE, AMYLASE in the last 168 hours. No results for input(s): AMMONIA in the last 168 hours. Coagulation Profile: No results for input(s): INR, PROTIME in the last 168 hours. Cardiac Enzymes: No results for input(s): CKTOTAL, CKMB, CKMBINDEX, TROPONINI in the last 168 hours. BNP (last 3 results) No results for input(s): PROBNP in the last 8760 hours. HbA1C: No results for input(s): HGBA1C in the last 72 hours. CBG: No results for input(s): GLUCAP in the last 168 hours. Lipid Profile: No results for input(s): CHOL, HDL, LDLCALC, TRIG, CHOLHDL, LDLDIRECT in the last 72 hours. Thyroid  Function Tests: No results for input(s): TSH, T4TOTAL, FREET4, T3FREE, THYROIDAB in the last 72 hours. Anemia Panel: No results for input(s): VITAMINB12, FOLATE, FERRITIN, TIBC, IRON, RETICCTPCT in the last 72 hours. Urine analysis:    Component Value Date/Time   COLORURINE YELLOW 08/11/2018 1802   APPEARANCEUR CLEAR 08/11/2018 1802   LABSPEC 1.017 08/11/2018 1802   PHURINE 5.0 08/11/2018 1802   GLUCOSEU NEGATIVE 08/11/2018 1802   HGBUR MODERATE (A) 08/11/2018 1802   BILIRUBINUR NEGATIVE 08/11/2018 1802   KETONESUR 80 (A) 08/11/2018 1802   PROTEINUR NEGATIVE 08/11/2018 1802   UROBILINOGEN 1.0 03/23/2015 0257   NITRITE NEGATIVE 08/11/2018 1802   LEUKOCYTESUR NEGATIVE 08/11/2018 1802    Radiological Exams on Admission: Dg Abd 1 View  Result Date: 08/11/2018 CLINICAL DATA:  Nausea. EXAM: ABDOMEN - 1 VIEW COMPARISON:  None. FINDINGS: The bowel gas pattern is normal. Status post cholecystectomy. No radio-opaque calculi or other significant radiographic abnormality are seen. IMPRESSION: No evidence of bowel obstruction or ileus. Electronically Signed   By: Lupita Raider, M.D.   On: 08/11/2018 21:15   Ct Head Wo Contrast  Result Date: 08/11/2018 CLINICAL DATA:  Nausea and dizziness, worse with movement. EXAM: CT HEAD WITHOUT CONTRAST TECHNIQUE: Contiguous axial images were obtained from the base of the skull through the vertex without intravenous contrast. COMPARISON:  08/07/2018 FINDINGS: Brain: Interval development of right nonhemorrhagic MCA territory infarct with loss of gray-white matter distinction involving the right temporal and parietal lobes. This is superimposed upon chronic moderate microvascular ischemic disease periventricular, deep and subcortical white matter. No hydrocephalus midline shift. Chronic remote right cerebellar infarct. Midline fourth ventricle and basal cisterns. No intra-axial mass nor extra-axial fluid. Vascular: Atherosclerosis of the carotid siphons. No  hyperdense vessels noted. Skull: No acute skull fracture. No suspicious osseous lesions. Sinuses/Orbits: No acute finding. Other: None. IMPRESSION: 1. Late acute to early subacute right MCA territory infarct with loss of gray-white matter distinction involving the right temporal and parietal lobes. No hemorrhage noted. These results were called by telephone at the time of interpretation on 08/11/2018 at 7:31 pm to Dr. Margarita Grizzle , who verbally acknowledged these results. 2. Chronic moderate small vessel ischemic disease of periventricular, deep and subcortical white matter. 3. Chronic right cerebellar infarct. Electronically Signed   By: Tollie Eth M.D.   On: 08/11/2018 19:29  Mr Brain Wo Contrast  Result Date: 08/11/2018 CLINICAL DATA:  Initial evaluation for acute stroke. EXAM: MRI HEAD WITHOUT CONTRAST MRA HEAD WITHOUT CONTRAST TECHNIQUE: Multiplanar, multiecho pulse sequences of the brain and surrounding structures were obtained without intravenous contrast. Angiographic images of the head were obtained using MRA technique without contrast. COMPARISON:  Prior CT from earlier the same day. FINDINGS: MRI HEAD FINDINGS Brain: Generalized age-related cerebral atrophy. Confluent T2/FLAIR hyperintensity within the periventricular deep white matter both cerebral hemispheres most consistent with chronic microvascular ischemic disease, advanced in nature. Encephalomalacia with gliosis within the right parietal region consistent with remote right posterior MCA territory infarct. Multiple additional scattered remote bilateral cerebellar infarcts, right greater than left. Confluent restricted diffusion seen involving the posterior right parietal temporal and occipital lobes, consistent with acute posterior right MCA territory infarct. This is positioned immediately adjacent to the chronic right MCA territory infarct. No associated hemorrhage or mass effect. No other areas of acute or subacute infarction. Gray-white  matter differentiation otherwise maintained. Multiple chronic micro hemorrhages noted, predominantly localized to the cerebellum, most like related to poorly controlled hypertension. No mass lesion, midline shift or mass effect. No hydrocephalus. No extra-axial fluid collection. Pituitary gland within normal limits. Vascular: Major intracranial vascular flow voids maintained. Skull and upper cervical spine: Craniocervical junction within normal limits. Visualized upper cervical spine within normal limits. Bone marrow signal intensity normal. No scalp soft tissue abnormality. Sinuses/Orbits: Patient status post bilateral ocular lens replacement. Paranasal sinuses are clear. No mastoid effusion. Inner ear structures grossly normal. Other: None. MRA HEAD FINDINGS ANTERIOR CIRCULATION: Examination degraded by motion artifact. Distal cervical segments of the internal carotid arteries are patent with symmetric antegrade flow. Petrous segments widely patent bilaterally. Cavernous/supraclinoid ICAs demonstrate mild multifocal atheromatous irregularity but are patent without hemodynamically significant stenosis. Right ICA terminus mildly ectatic. A1 segments widely patent bilaterally. Anterior communicating artery not well assessed. Anterior cerebral arteries patent to their distal aspects without proximal high-grade stenosis. M1 segments mildly irregular but are patent to their distal aspects without high-grade stenosis or occlusion. Normal MCA bifurcations. No proximal M2 occlusion. Distal right MCA branches attenuated as compared to the left, like related to chronic right MCA territory infarction. Distal small vessel atheromatous irregularity noted bilaterally. POSTERIOR CIRCULATION: Dominant left vertebral artery patent to the vertebrobasilar junction without stenosis. Right vertebral artery somewhat diminutive with multifocal atheromatous irregularity without high-grade stenosis. Left vertebral artery dominant. Left  PICA patent proximally. Right PICA not well visualized. Basilar mildly tortuous with superimposed short-segment mild stenosis at its distal aspect (series 604, image 1). Superior cerebral arteries patent bilaterally. Left PCA supplied via the basilar. Right PCA supplied via the basilar as well as a prominent right posterior communicating artery. PCAs patent to their distal aspects without flow-limiting stenosis. Distal small vessel atheromatous irregularity. No intracranial aneurysm. IMPRESSION: MRI HEAD IMPRESSION: 1. Acute ischemic right posterior MCA territory infarct as above. No associated hemorrhage or mass effect. 2. Additional chronic right posterior MCA territory infarct, with multiple chronic bilateral cerebellar infarcts, right greater than left. 3. Multiple chronic micro hemorrhages seen scattered throughout the cerebellum, most consistent with chronic poorly controlled hypertension. 4. Underlying age-related cerebral atrophy with advanced chronic microvascular ischemic disease. MRA HEAD IMPRESSION: 1. Negative intracranial MRA for large vessel occlusion. 2. Attenuation of the right MCA branches as compared to the left, likely related to the patient's chronic right MCA territory infarction. 3. Moderate small vessel atheromatous irregularity throughout the intracranial circulation. No proximal high-grade or correctable stenosis identified. Electronically Signed  By: Rise Mu M.D.   On: 08/11/2018 22:33   Dg Chest Port 1 View  Result Date: 08/11/2018 CLINICAL DATA:  Altered mental status. Weakness. Nausea and dizziness. EXAM: PORTABLE CHEST 1 VIEW COMPARISON:  08/07/2018 FINDINGS: The cardiomediastinal silhouette is unchanged with borderline cardiomegaly and extensive mitral annular calcification noted. There is aortic atherosclerosis. Lung volumes are low with mild elevation of the right hemidiaphragm. Medial left retrocardiac density corresponds to a known hiatal hernia. No definite  airspace consolidation, sizeable pleural effusion, or pneumothorax is identified. Surgical clips are noted in the lower neck, left axilla, and right upper abdomen. No acute osseous abnormality is seen. IMPRESSION: Low lung volumes without evidence of acute airspace disease. Electronically Signed   By: Sebastian Ache M.D.   On: 08/11/2018 19:11   Mr Maxine Glenn Head Wo Contrast  Result Date: 08/11/2018 CLINICAL DATA:  Initial evaluation for acute stroke. EXAM: MRI HEAD WITHOUT CONTRAST MRA HEAD WITHOUT CONTRAST TECHNIQUE: Multiplanar, multiecho pulse sequences of the brain and surrounding structures were obtained without intravenous contrast. Angiographic images of the head were obtained using MRA technique without contrast. COMPARISON:  Prior CT from earlier the same day. FINDINGS: MRI HEAD FINDINGS Brain: Generalized age-related cerebral atrophy. Confluent T2/FLAIR hyperintensity within the periventricular deep white matter both cerebral hemispheres most consistent with chronic microvascular ischemic disease, advanced in nature. Encephalomalacia with gliosis within the right parietal region consistent with remote right posterior MCA territory infarct. Multiple additional scattered remote bilateral cerebellar infarcts, right greater than left. Confluent restricted diffusion seen involving the posterior right parietal temporal and occipital lobes, consistent with acute posterior right MCA territory infarct. This is positioned immediately adjacent to the chronic right MCA territory infarct. No associated hemorrhage or mass effect. No other areas of acute or subacute infarction. Gray-white matter differentiation otherwise maintained. Multiple chronic micro hemorrhages noted, predominantly localized to the cerebellum, most like related to poorly controlled hypertension. No mass lesion, midline shift or mass effect. No hydrocephalus. No extra-axial fluid collection. Pituitary gland within normal limits. Vascular: Major  intracranial vascular flow voids maintained. Skull and upper cervical spine: Craniocervical junction within normal limits. Visualized upper cervical spine within normal limits. Bone marrow signal intensity normal. No scalp soft tissue abnormality. Sinuses/Orbits: Patient status post bilateral ocular lens replacement. Paranasal sinuses are clear. No mastoid effusion. Inner ear structures grossly normal. Other: None. MRA HEAD FINDINGS ANTERIOR CIRCULATION: Examination degraded by motion artifact. Distal cervical segments of the internal carotid arteries are patent with symmetric antegrade flow. Petrous segments widely patent bilaterally. Cavernous/supraclinoid ICAs demonstrate mild multifocal atheromatous irregularity but are patent without hemodynamically significant stenosis. Right ICA terminus mildly ectatic. A1 segments widely patent bilaterally. Anterior communicating artery not well assessed. Anterior cerebral arteries patent to their distal aspects without proximal high-grade stenosis. M1 segments mildly irregular but are patent to their distal aspects without high-grade stenosis or occlusion. Normal MCA bifurcations. No proximal M2 occlusion. Distal right MCA branches attenuated as compared to the left, like related to chronic right MCA territory infarction. Distal small vessel atheromatous irregularity noted bilaterally. POSTERIOR CIRCULATION: Dominant left vertebral artery patent to the vertebrobasilar junction without stenosis. Right vertebral artery somewhat diminutive with multifocal atheromatous irregularity without high-grade stenosis. Left vertebral artery dominant. Left PICA patent proximally. Right PICA not well visualized. Basilar mildly tortuous with superimposed short-segment mild stenosis at its distal aspect (series 604, image 1). Superior cerebral arteries patent bilaterally. Left PCA supplied via the basilar. Right PCA supplied via the basilar as well as a prominent right posterior  communicating artery. PCAs patent to their distal aspects without flow-limiting stenosis. Distal small vessel atheromatous irregularity. No intracranial aneurysm. IMPRESSION: MRI HEAD IMPRESSION: 1. Acute ischemic right posterior MCA territory infarct as above. No associated hemorrhage or mass effect. 2. Additional chronic right posterior MCA territory infarct, with multiple chronic bilateral cerebellar infarcts, right greater than left. 3. Multiple chronic micro hemorrhages seen scattered throughout the cerebellum, most consistent with chronic poorly controlled hypertension. 4. Underlying age-related cerebral atrophy with advanced chronic microvascular ischemic disease. MRA HEAD IMPRESSION: 1. Negative intracranial MRA for large vessel occlusion. 2. Attenuation of the right MCA branches as compared to the left, likely related to the patient's chronic right MCA territory infarction. 3. Moderate small vessel atheromatous irregularity throughout the intracranial circulation. No proximal high-grade or correctable stenosis identified. Electronically Signed   By: Rise MuBenjamin  McClintock M.D.   On: 08/11/2018 22:33    EKG: Independently reviewed. Sinus rhythm with dropped beat, nonspecific ST-T changes and TWI in V5.  Assessment/Plan Principal Problem:   Acute right MCA stroke [facility](HCC) Active Problems:   Hypothyroidism   Essential hypertension   Coronary artery disease   Abdominal pain    Leah Figueroa is a 84 y.o. female with medical history significant for hypertension, CAD, Hx of right MCA stroke, hypothyroidism, and dementia who presents the ED with left-sided weakness, confusion, and dizziness admitted with acute to subacute right MCA territory infarct.  Acute to subacute right MCA territory ischemic infarct: Likely occurred prior to recent ED visit 08/07/2018 at which time CT head did not show acute infarct.  Discussed case with patient's daughter and caretaker who [facility].  She has been on Plavix as an outpatient but unable to maintain oral intake the last 2 days. -Transferred to [facility] for neurology/stroke team evaluation -Aspirin 300 mg rectally -MRI/MRA head -Echocardiogram, carotid Dopplers -Continue statin -SLP/PT/OT eval -Permissive hypertension -Maintenance IV fluids  Hypertension: -Hold home amlodipine and metoprolol for permissive hypertension  Hypothyroidism: -Convert home Synthroid to IV 12.5 mcg while n.p.o.  Abdominal pain: Patient with mild left-sided abdominal pain, question of constipation. -KUB ordered and negative for evidence of bowel obstruction or ileus   DVT prophylaxis: Lovenox  Code Status: DNR Family Communication: Discussed with patient's daughter and friend at bedside Disposition Plan: Pending stroke work-up and management, therapy recommendations Consults called: Neurology consulted by EDP Admission status: Inpatient   Darreld McleanVishal Shlomie Romig MD [facility] Pager [phone]  If 7PM-7AM, please contact night-coverage [email or web address] Password [email or web address]  08/12/2018, 1:15 AM

## 2018-08-11 NOTE — ED Notes (Signed)
Patient transported to CT 

## 2018-08-11 NOTE — ED Provider Notes (Signed)
[facility]-EMERGENCY DEPT Provider Note   CSN: [id number] Arrival date & time: 08/11/18  1733     History   Chief Complaint Chief Complaint  Patient presents with  . Nausea    HPI Leah Figueroa is a 84 y.o. female. Level 5 caveat secondary to patient's inability to give history due to history of stroke HPI  84 yo female with ho cva lives at home with daughter.  Daughter reports that they were seen on Monday with some increased left arm weakness.  At that time her head CT did not show any acute abnormalities.  It was felt that she did not need further evaluation as it would likely not do any intervention even if she was having a stroke.  She was discharged to home.  Since that time she has become increasingly weak, dizzy, and confused.  He has had decreased p.o. intake yesterday and today.  Past Medical History:  Diagnosis Date  . Acute myocardial infarction of other anterior wall, episode of care unspecified   . Arthritis   . Coronary artery disease   . Other and unspecified hyperlipidemia   . Personal history of arthritis   . Personal history of malignant neoplasm of breast    s/p lumpectomy  . Stroke [facility]) 03/2015   "left sided issues"  . Unspecified essential hypertension   . Unspecified hypothyroidism     Patient Active Problem List   Diagnosis Date Noted  . Hematochezia 05/15/2017  . Coronary artery disease 05/15/2017  . Other and unspecified hyperlipidemia 05/15/2017  . Hypothyroidism 05/15/2017  . HTN (hypertension) 05/15/2017  . TIA (transient ischemic attack) 05/02/2017  . History of stroke 03/07/2017  . Gait abnormality 03/07/2017  . Subacromial impingement of left shoulder 02/09/2016  . Hemiparesis affecting left side as late effect of stroke (HCC) 01/20/2016  . Vertigo 09/03/2015  . Dehydration 09/03/2015  . Acute lower UTI 09/03/2015  . Prolonged Q-T interval on ECG 09/03/2015  . Left-sided neglect 07/01/2015  . History of right  MCA stroke 07/01/2015  . Altered sensation due to stroke 04/29/2015  . Cognitive deficit, post-stroke 04/29/2015  . Pseudogout of left wrist 03/28/2015  . Hyperlipidemia LDL goal <70 03/26/2015  . Hyperglycemia 03/26/2015  . 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 Embolic stroke (HCC)   . Left hemiplegia (HCC)   . Homonymous hemianopia   . Chest wall trauma 01/11/2014  . Injury of left lower arm 01/11/2014  . Headache(784.0) 01/04/2013  . Allergic rhinitis, cause unspecified 01/04/2013  . Anemia 04/19/2012  . Epistaxis, recurrent 04/19/2012  . Hypothyroidism 01/01/2008  . Essential hypertension 01/01/2008  . HYPERTENSION 01/01/2008  . MYOCARDIAL INFARCTION, ACUTE, ANTERIOR WALL 01/01/2008  . LEG CRAMPS 01/01/2008  . ADENOCARCINOMA, BREAST, HX OF 01/01/2008  . DIVERTICULOSIS, COLON, HX OF 01/01/2008  . ARTHRITIS, HX OF 01/01/2008  . RADIATION THERAPY, HX OF 01/01/2008  . CATARACT EXTRACTION, HX OF 01/01/2008  . CHOLECYSTECTOMY, LAPAROSCOPIC, HX OF 01/01/2008    Past Surgical History:  Procedure Laterality Date  . BACK SURGERY    . CATARACT EXTRACTION     left-2006, right 2011  . CHOLECYSTECTOMY    . intrathoracic goiter resection    . lumpectomy for breast cancer Left 80's   radiation therapy  . right thyroid lobectomy     Hx Goiter     OB History   No obstetric history on file.      Home Medications    Prior to Admission  medications   Medication Sig Start Date End Date Taking? Authorizing Provider  acetaminophen (TYLENOL) 325 MG tablet Take 2 tablets (650 mg total) by mouth every 4 (four) hours as needed for mild pain. Patient not taking: Reported on 08/07/2018 04/07/15   Jacquelynn CreeLove, Pamela S, PA-C  ALPRAZolam Prudy Feeler(XANAX) 0.25 MG tablet Take 0.25 mg by mouth at bedtime.  08/31/16   [provider]  amLODipine (NORVASC) 5 MG tablet Take 5 mg by mouth daily. 08/23/15   [provider]  atorvastatin (LIPITOR) 10 MG tablet  Take 10 mg by mouth at bedtime.     [provider]  bisacodyl (DULCOLAX) 5 MG EC tablet Take 1 tablet (5 mg total) by mouth daily as needed for moderate constipation. Patient not taking: Reported on 08/07/2018 05/17/17   Joseph ArtVann, Jessica U, DO  clopidogrel (PLAVIX) 75 MG tablet Take 1 tablet (75 mg total) by mouth daily. 04/07/15   Love, Evlyn KannerPamela S, PA-C  diphenhydramine-acetaminophen (TYLENOL PM) 25-500 MG TABS tablet Take 1 tablet by mouth at bedtime.    [provider]  docusate sodium (COLACE) 100 MG capsule Take 100 mg by mouth at bedtime.     [provider]  ferrous sulfate 325 (65 FE) MG tablet Take 325 mg by mouth at bedtime.     [provider]  levothyroxine (SYNTHROID, LEVOTHROID) 25 MCG tablet Take 1 tablet by mouth  daily Patient taking differently: Take 25 mcg by mouth daily.  12/25/14   Plotnikov, Georgina QuintAleksei V, MD  meclizine (ANTIVERT) 25 MG tablet Take 1 tablet (25 mg total) by mouth 3 (three) times daily. Patient not taking: Reported on 08/07/2018 05/05/17   Tyrone NineGrunz, Ryan B, MD  Melatonin 10 MG TABS Take 10 mg by mouth at bedtime.     [provider]  metoprolol (LOPRESSOR) 50 MG tablet Take 50 mg by mouth 2 (two) times daily. 08/30/15   [provider]  ondansetron (ZOFRAN) 8 MG tablet Take 1 tablet (8 mg total) by mouth every 8 (eight) hours as needed for nausea or vomiting. Patient not taking: Reported on 08/07/2018 05/04/17   Tyrone NineGrunz, Ryan B, MD  polyethylene glycol [facility](MIRALAX / Ethelene HalGLYCOLAX) packet Take 17 g by mouth at bedtime. Patient not taking: Reported on 08/07/2018 05/17/17   Joseph ArtVann, Jessica U, DO    Family History Family History  Problem Relation Age of Onset  . Colon cancer Mother   . Breast cancer Mother   . Coronary artery disease Brother   . Dementia Sister     Social History Social History   Tobacco Use  . Smoking status: Never Smoker  . Smokeless tobacco: Never Used  Substance Use Topics  . Alcohol use: No  . Drug  use: No     Allergies   Patient has no known allergies.   Review of Systems Review of Systems  All other systems reviewed and are negative.    Physical Exam Updated Vital Signs BP (!) 134/91 (BP Location: Right Arm)   Pulse 84   Resp 18   SpO2 96%   Physical Exam Vitals signs and nursing note reviewed.  Constitutional:      Appearance: Normal appearance.  HENT:     Head: Normocephalic and atraumatic.     Right Ear: External ear normal.     Left Ear: External ear normal.     Nose: Nose normal.     Mouth/Throat:     Mouth: Mucous membranes are moist.  Eyes:     Pupils: Pupils  are equal, round, and reactive to light.  Neck:     Musculoskeletal: Normal range of motion.  Cardiovascular:     Rate and Rhythm: Normal rate and regular rhythm.  Pulmonary:     Effort: Pulmonary effort is normal.     Breath sounds: Normal breath sounds.  Abdominal:     General: Abdomen is flat.  Musculoskeletal: Normal range of motion.     Comments: Patient with skin tear left lower extremity  Skin:    General: Skin is warm.     Capillary Refill: Capillary refill takes less than 2 seconds.  Neurological:     Mental Status: She is alert.     Comments: Left arm with weakness, left leg weak Patient awake and oriented to person and place, not date  Psychiatric:        Mood and Affect: Mood normal.      ED Treatments / Results  Labs (all labs ordered are listed, but only abnormal results are displayed) Labs Reviewed  URINALYSIS, ROUTINE W REFLEX MICROSCOPIC  CBC  COMPREHENSIVE METABOLIC PANEL    EKG None  Radiology Ct Head Wo Contrast  Result Date: 08/11/2018 CLINICAL DATA:  Nausea and dizziness, worse with movement. EXAM: CT HEAD WITHOUT CONTRAST TECHNIQUE: Contiguous axial images were obtained from the base of the skull through the vertex without intravenous contrast. COMPARISON:  08/07/2018 FINDINGS: Brain: Interval development of right nonhemorrhagic MCA territory infarct  with loss of gray-white matter distinction involving the right temporal and parietal lobes. This is superimposed upon chronic moderate microvascular ischemic disease periventricular, deep and subcortical white matter. No hydrocephalus midline shift. Chronic remote right cerebellar infarct. Midline fourth ventricle and basal cisterns. No intra-axial mass nor extra-axial fluid. Vascular: Atherosclerosis of the carotid siphons. No hyperdense vessels noted. Skull: No acute skull fracture. No suspicious osseous lesions. Sinuses/Orbits: No acute finding. Other: None. IMPRESSION: 1. Late acute to early subacute right MCA territory infarct with loss of gray-white matter distinction involving the right temporal and parietal lobes. No hemorrhage noted. These results were called by telephone at the time of interpretation on 08/11/2018 at 7:31 pm to Dr. Margarita Grizzle , who verbally acknowledged these results. 2. Chronic moderate small vessel ischemic disease of periventricular, deep and subcortical white matter. 3. Chronic right cerebellar infarct. Electronically Signed   By: Tollie Eth M.D.   On: 08/11/2018 19:29   Dg Chest Port 1 View  Result Date: 08/11/2018 CLINICAL DATA:  Altered mental status. Weakness. Nausea and dizziness. EXAM: PORTABLE CHEST 1 VIEW COMPARISON:  08/07/2018 FINDINGS: The cardiomediastinal silhouette is unchanged with borderline cardiomegaly and extensive mitral annular calcification noted. There is aortic atherosclerosis. Lung volumes are low with mild elevation of the right hemidiaphragm. Medial left retrocardiac density corresponds to a known hiatal hernia. No definite airspace consolidation, sizeable pleural effusion, or pneumothorax is identified. Surgical clips are noted in the lower neck, left axilla, and right upper abdomen. No acute osseous abnormality is seen. IMPRESSION: Low lung volumes without evidence of acute airspace disease. Electronically Signed   By: Sebastian Ache M.D.   On:  08/11/2018 19:11    Procedures Procedures (including critical care time)  Medications Ordered in ED Medications  sodium chloride 0.9 % bolus 500 mL (has no administration in time range)     Initial Impression / Assessment and Plan / ED Course  I have reviewed the triage vital signs and the nursing notes.  Pertinent labs & imaging results that were available during my care of the patient  were reviewed by me and considered in my medical decision making (see chart for details).    7:35 PM Received call from radiologist with evolving subacute mca infarct  With Dr. Otelia LimesLindzen on-call for neurology and Dr. Allena KatzPatel, on-call for hospitalist.  They will see and evaluate and [facility] Discussed with the patient's family   Final Clinical Impressions(s) / ED Diagnoses   Final diagnoses:  None    ED Discharge Orders    None       Margarita Grizzleay, Sylver Vantassell, MD 08/15/18 [phone]

## 2018-08-11 NOTE — ED Notes (Addendum)
Wound care complete and telfa applied to skin tear on left lower leg that was present upon arrival.

## 2018-08-11 NOTE — ED Notes (Signed)
Patient transported to MRI 

## 2018-08-11 NOTE — ED Notes (Signed)
Hospitalist at bedside 

## 2018-08-11 NOTE — ED Provider Notes (Signed)
[facility]-EMERGENCY DEPT Provider Note   CSN: [id number] Arrival date & time: 08/11/18  1733     History   Chief Complaint Chief Complaint  Patient presents with  . Nausea    HPI Leah Figueroa is a 84 y.o. female.  HPI  84 yo female ho right mca stroke, reports ministroke Monday, patient with decreased ability to walk since then.  Daughter is historian, patient lives with new need for a wheelchair.  Tuesday-Thursday patient seemed weaker but not specifically worse.  Today she had more confusion, nauseated , 'dry heaving", will not take po including medications, decreased urination.  Patient seen by pmd office yesterday and will write for wheelchair, with planned recheck next week.  No head injury, some cough, no diarrhea, no fever or chills, but has been hot per daughter.   Past Medical History:  Diagnosis Date  . Acute myocardial infarction of other anterior wall, episode of care unspecified   . Arthritis   . Coronary artery disease   . Other and unspecified hyperlipidemia   . Personal history of arthritis   . Personal history of malignant neoplasm of breast    s/p lumpectomy  . Stroke [facility](HCC) 03/2015   "left sided issues"  . Unspecified essential hypertension   . Unspecified hypothyroidism     Patient Active Problem List   Diagnosis Date Noted  . Hematochezia 05/15/2017  . Coronary artery disease 05/15/2017  . Other and unspecified hyperlipidemia 05/15/2017  . Hypothyroidism 05/15/2017  . HTN (hypertension) 05/15/2017  . TIA (transient ischemic attack) 05/02/2017  . History of stroke 03/07/2017  . Gait abnormality 03/07/2017  . Subacromial impingement of left shoulder 02/09/2016  . Hemiparesis affecting left side as late effect of stroke (HCC) 01/20/2016  . Vertigo 09/03/2015  . Dehydration 09/03/2015  . Acute lower UTI 09/03/2015  . Prolonged Q-T interval on ECG 09/03/2015  . Left-sided neglect 07/01/2015  . History of right MCA stroke  07/01/2015  . Altered sensation due to stroke 04/29/2015  . Cognitive deficit, post-stroke 04/29/2015  . Pseudogout of left wrist 03/28/2015  . Hyperlipidemia LDL goal <70 03/26/2015  . Hyperglycemia 03/26/2015  . 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 Embolic stroke (HCC)   . Left hemiplegia (HCC)   . Homonymous hemianopia   . Chest wall trauma 01/11/2014  . Injury of left lower arm 01/11/2014  . Headache(784.0) 01/04/2013  . Allergic rhinitis, cause unspecified 01/04/2013  . Anemia 04/19/2012  . Epistaxis, recurrent 04/19/2012  . Hypothyroidism 01/01/2008  . Essential hypertension 01/01/2008  . HYPERTENSION 01/01/2008  . MYOCARDIAL INFARCTION, ACUTE, ANTERIOR WALL 01/01/2008  . LEG CRAMPS 01/01/2008  . ADENOCARCINOMA, BREAST, HX OF 01/01/2008  . DIVERTICULOSIS, COLON, HX OF 01/01/2008  . ARTHRITIS, HX OF 01/01/2008  . RADIATION THERAPY, HX OF 01/01/2008  . CATARACT EXTRACTION, HX OF 01/01/2008  . CHOLECYSTECTOMY, LAPAROSCOPIC, HX OF 01/01/2008    Past Surgical History:  Procedure Laterality Date  . BACK SURGERY    . CATARACT EXTRACTION     left-2006, right 2011  . CHOLECYSTECTOMY    . intrathoracic goiter resection    . lumpectomy for breast cancer Left 80's   radiation therapy  . right thyroid lobectomy     Hx Goiter     OB History   No obstetric history on file.      Home Medications    Prior to Admission medications   Medication Sig Start Date End Date Taking? Authorizing Provider  acetaminophen (  TYLENOL) 325 MG tablet Take 2 tablets (650 mg total) by mouth every 4 (four) hours as needed for mild pain. Patient not taking: Reported on 08/07/2018 04/07/15   Jacquelynn CreeLove, Pamela S, PA-C  ALPRAZolam Prudy Feeler(XANAX) 0.25 MG tablet Take 0.25 mg by mouth at bedtime.  08/31/16   [provider]  amLODipine (NORVASC) 5 MG tablet Take 5 mg by mouth daily. 08/23/15   [provider]  atorvastatin (LIPITOR) 10 MG tablet Take 10 mg  by mouth at bedtime.     [provider]  bisacodyl (DULCOLAX) 5 MG EC tablet Take 1 tablet (5 mg total) by mouth daily as needed for moderate constipation. Patient not taking: Reported on 08/07/2018 05/17/17   Joseph ArtVann, Jessica U, DO  clopidogrel (PLAVIX) 75 MG tablet Take 1 tablet (75 mg total) by mouth daily. 04/07/15   Love, Evlyn KannerPamela S, PA-C  diphenhydramine-acetaminophen (TYLENOL PM) 25-500 MG TABS tablet Take 1 tablet by mouth at bedtime.    [provider]  docusate sodium (COLACE) 100 MG capsule Take 100 mg by mouth at bedtime.     [provider]  ferrous sulfate 325 (65 FE) MG tablet Take 325 mg by mouth at bedtime.     [provider]  levothyroxine (SYNTHROID, LEVOTHROID) 25 MCG tablet Take 1 tablet by mouth  daily Patient taking differently: Take 25 mcg by mouth daily.  12/25/14   Plotnikov, Georgina QuintAleksei V, MD  meclizine (ANTIVERT) 25 MG tablet Take 1 tablet (25 mg total) by mouth 3 (three) times daily. Patient not taking: Reported on 08/07/2018 05/05/17   Tyrone NineGrunz, Ryan B, MD  Melatonin 10 MG TABS Take 10 mg by mouth at bedtime.     [provider]  metoprolol (LOPRESSOR) 50 MG tablet Take 50 mg by mouth 2 (two) times daily. 08/30/15   [provider]  ondansetron (ZOFRAN) 8 MG tablet Take 1 tablet (8 mg total) by mouth every 8 (eight) hours as needed for nausea or vomiting. Patient not taking: Reported on 08/07/2018 05/04/17   Tyrone NineGrunz, Ryan B, MD  polyethylene glycol [facility](MIRALAX / Ethelene HalGLYCOLAX) packet Take 17 g by mouth at bedtime. Patient not taking: Reported on 08/07/2018 05/17/17   Joseph ArtVann, Jessica U, DO    Family History Family History  Problem Relation Age of Onset  . Colon cancer Mother   . Breast cancer Mother   . Coronary artery disease Brother   . Dementia Sister     Social History Social History   Tobacco Use  . Smoking status: Never Smoker  . Smokeless tobacco: Never Used  Substance Use Topics  . Alcohol use: No  . Drug use: No      Allergies   Patient has no known allergies.   Review of Systems Review of Systems  All other systems reviewed and are negative.    Physical Exam Updated Vital Signs BP (!) 134/91 (BP Location: Right Arm)   Pulse 84   Resp 18   SpO2 96%   Physical Exam Vitals signs and nursing note reviewed.  HENT:     Head: Normocephalic and atraumatic.     Right Ear: External ear normal.     Left Ear: External ear normal.     Nose: Nose normal.     Mouth/Throat:     Mouth: Mucous membranes are moist.  Eyes:     Pupils: Pupils are equal, round, and reactive to light.  Neck:     Musculoskeletal: Normal range of motion.  Cardiovascular:  Rate and Rhythm: Normal rate.     Pulses: Normal pulses.  Pulmonary:     Effort: Pulmonary effort is normal.  Abdominal:     General: Abdomen is flat.  Musculoskeletal: Normal range of motion.  Skin:    Capillary Refill: Capillary refill takes less than 2 seconds.  Neurological:     Mental Status: She is alert.     Comments: Left arm and leg with drift  Psychiatric:        Mood and Affect: Mood normal.      ED Treatments / Results  Labs (all labs ordered are listed, but only abnormal results are displayed) Labs Reviewed  URINALYSIS, ROUTINE W REFLEX MICROSCOPIC  CBC  COMPREHENSIVE METABOLIC PANEL    EKG EKG Interpretation  Date/Time:  Friday August 11 2018 18:45:59 EST Ventricular Rate:  84 PR Interval:    QRS Duration: 91 QT Interval:  422 QTC Calculation: 499 R Axis:   40 Text Interpretation:  Normal sinus rhythm Non-specific ST-t changes with t wave inversion in v5  new from last tracing 4 days ago Confirmed by Margarita Grizzle [phone]) on 08/11/2018 7:37:14 PM   Radiology No results found.  Procedures Procedures (including critical care time)  Medications Ordered in ED Medications - No data to display   Initial Impression / Assessment and Plan / ED Course  I have reviewed the triage vital signs and the  nursing notes.  Pertinent labs & imaging results that were available during my care of the patient were reviewed by me and considered in my medical decision making (see chart for details).       Final Clinical Impressions(s) / ED Diagnoses   Final diagnoses:  Cerebrovascular accident (CVA), unspecified mechanism [facility])    ED Discharge Orders    None       Margarita Grizzle, MD 08/15/18 [phone]

## 2018-08-11 NOTE — ED Notes (Signed)
Bed: WA06 Expected date:  Expected time:  Means of arrival:  Comments: EMS-diarrhea 

## 2018-08-11 NOTE — ED Notes (Signed)
[facility] called for transport. 

## 2018-08-11 NOTE — ED Triage Notes (Signed)
Per EMS, patient from home, c/o nausea and dizziness. [facility]. Dizziness worsens with movement. Non ambulatory. Patient uses wheel chair. Hx dementia.  CBG 121 BP 146/88 HR 80 RR 16

## 2018-08-11 NOTE — ED Notes (Signed)
ED TO INPATIENT HANDOFF REPORT  Name/Age/Gender Leah Figueroa 84 y.o. female  Code Status    Code Status Orders  (From admission, onward)         Start     Ordered   08/11/18 2044  Do not attempt resuscitation (DNR)  Continuous    Question Answer Comment  In the event of cardiac or respiratory ARREST Do not call a "code blue"   In the event of cardiac or respiratory ARREST Do not perform Intubation, CPR, defibrillation or ACLS   In the event of cardiac or respiratory ARREST Use medication by any route, position, wound care, and other measures to relive pain and suffering. May use oxygen, suction and manual treatment of airway obstruction as needed for comfort.      08/11/18 2047        Code Status History    Date Active Date Inactive Code Status Order ID Comments User Context   05/15/2017 2305 05/17/2017 2221 DNR [id number]  Russella Dar, NP Inpatient   05/15/2017 1452 05/15/2017 2305 DNR [id number]  Russella Dar, NP ED   05/03/2017 0012 05/05/2017 1912 DNR [id number]  Hillary Bow, DO ED   09/03/2015 2224 09/05/2015 2100 DNR [id number]  Therisa Doyne, MD Inpatient   03/26/2015 1649 04/08/2015 1655 Full Code [id number]  Jerene Pitch Inpatient   03/23/2015 0240 03/26/2015 1649 Full Code [id number]  Thana Farr, MD Inpatient   04/19/2012 0945 04/20/2012 1342 Full Code [id number]  Jacquelyne Balint, RN Inpatient    Advance Directive Documentation     Most Recent Value  Type of Advance Directive  Healthcare Power of Attorney, Living will  Pre-existing out of facility DNR order (yellow form or pink MOST form)  -  "MOST" Form in Place?  -      Home/SNF/Other Home  Chief Complaint Nausea; Dizziness  Level of Care/Admitting Diagnosis ED Disposition    ED Disposition Condition Comment   Admit  Hospital Area: [facility] [[id number]]  Level of Care: Medical Telemetry [104]  Diagnosis: Acute right MCA stroke [facility]) [[id number]]  Admitting Physician: Charlsie Quest [[id number]]  Attending Physician: Charlsie Quest [[id number]]  Estimated length of stay: past midnight tomorrow  Certification:: I certify this patient will need inpatient services for at least 2 midnights  PT Class (Do Not Modify): Inpatient [101]  PT Acc Code (Do Not Modify): Private [1]       Medical History Past Medical History:  Diagnosis Date  . Acute myocardial infarction of other anterior wall, episode of care unspecified   . Arthritis   . Coronary artery disease   . Other and unspecified hyperlipidemia   . Personal history of arthritis   . Personal history of malignant neoplasm of breast    s/p lumpectomy  . Stroke [facility]) 03/2015   "left sided issues"  . Unspecified essential hypertension   . Unspecified hypothyroidism     Allergies No Known Allergies  IV Location/Drains/Wounds Patient Lines/Drains/Airways Status   Active Line/Drains/Airways    Name:   Placement date:   Placement time:   Site:   Days:   Peripheral IV 08/11/18 Right Antecubital   08/11/18    1933    Antecubital   less than 1          Labs/Imaging Results for orders placed or performed during the hospital encounter of 08/11/18 (from the past 48 hour(s))  CBC     Status: None   Collection Time: 08/11/18  6:26 PM  Result Value Ref Range   WBC 8.2 4.0 - 10.5 K/uL   RBC 4.40 3.87 - 5.11 MIL/uL   Hemoglobin 14.0 12.0 - 15.0 g/dL   HCT 56.442.1 33.236.0 - 95.146.0 %   MCV 95.7 80.0 - 100.0 fL   MCH 31.8 26.0 - 34.0 pg   MCHC 33.3 30.0 - 36.0 g/dL   RDW 88.412.7 16.611.5 - 06.315.5 %   Platelets 243 150 - 400 K/uL   nRBC 0.0 0.0 - 0.2 %    Comment: Performed at [facility], [address] [place], [place] [address]  Comprehensive metabolic panel     Status: Abnormal   Collection Time: 08/11/18  6:26 PM  Result Value Ref Range   Sodium 140 135 - 145 mmol/L   Potassium 3.8 3.5 - 5.1 mmol/L   Chloride 104 98 - 111 mmol/L   CO2 22 22 - 32 mmol/L   Glucose, Bld 133 (H) 70 - 99 mg/dL   BUN 13  8 - 23 mg/dL   Creatinine, Ser 0.930.59 0.44 - 1.00 mg/dL   Calcium 9.1 8.9 - 23.510.3 mg/dL   Total Protein 7.8 6.5 - 8.1 g/dL   Albumin 4.1 3.5 - 5.0 g/dL   AST 28 15 - 41 U/L   ALT 16 0 - 44 U/L   Alkaline Phosphatase 63 38 - 126 U/L   Total Bilirubin 1.5 (H) 0.3 - 1.2 mg/dL   GFR calc non Af Amer >60 >60 mL/min   GFR calc Af Amer >60 >60 mL/min   Anion gap 14 5 - 15    Comment: Performed at [facility], [address] [place], [place] [address]   Dg Abd 1 View  Result Date: 08/11/2018 CLINICAL DATA:  Nausea. EXAM: ABDOMEN - 1 VIEW COMPARISON:  None. FINDINGS: The bowel gas pattern is normal. Status post cholecystectomy. No radio-opaque calculi or other significant radiographic abnormality are seen. IMPRESSION: No evidence of bowel obstruction or ileus. Electronically Signed   By: Lupita RaiderJames  Green Jr, M.D.   On: 08/11/2018 21:15   Ct Head Wo Contrast  Result Date: 08/11/2018 CLINICAL DATA:  Nausea and dizziness, worse with movement. EXAM: CT HEAD WITHOUT CONTRAST TECHNIQUE: Contiguous axial images were obtained from the base of the skull through the vertex without intravenous contrast. COMPARISON:  08/07/2018 FINDINGS: Brain: Interval development of right nonhemorrhagic MCA territory infarct with loss of gray-white matter distinction involving the right temporal and parietal lobes. This is superimposed upon chronic moderate microvascular ischemic disease periventricular, deep and subcortical white matter. No hydrocephalus midline shift. Chronic remote right cerebellar infarct. Midline fourth ventricle and basal cisterns. No intra-axial mass nor extra-axial fluid. Vascular: Atherosclerosis of the carotid siphons. No hyperdense vessels noted. Skull: No acute skull fracture. No suspicious osseous lesions. Sinuses/Orbits: No acute finding. Other: None. IMPRESSION: 1. Late acute to early subacute right MCA territory infarct with loss of gray-white matter distinction involving the right  temporal and parietal lobes. No hemorrhage noted. These results were called by telephone at the time of interpretation on 08/11/2018 at 7:31 pm to Dr. Margarita GrizzleANIELLE RAY , who verbally acknowledged these results. 2. Chronic moderate small vessel ischemic disease of periventricular, deep and subcortical white matter. 3. Chronic right cerebellar infarct. Electronically Signed   By: Tollie Ethavid  Kwon M.D.   On: 08/11/2018 19:29   Mr Brain Wo Contrast  Result Date: 08/11/2018 CLINICAL DATA:  Initial evaluation for acute stroke. EXAM: MRI HEAD WITHOUT CONTRAST MRA HEAD WITHOUT CONTRAST TECHNIQUE:  Multiplanar, multiecho pulse sequences of the brain and surrounding structures were obtained without intravenous contrast. Angiographic images of the head were obtained using MRA technique without contrast. COMPARISON:  Prior CT from earlier the same day. FINDINGS: MRI HEAD FINDINGS Brain: Generalized age-related cerebral atrophy. Confluent T2/FLAIR hyperintensity within the periventricular deep white matter both cerebral hemispheres most consistent with chronic microvascular ischemic disease, advanced in nature. Encephalomalacia with gliosis within the right parietal region consistent with remote right posterior MCA territory infarct. Multiple additional scattered remote bilateral cerebellar infarcts, right greater than left. Confluent restricted diffusion seen involving the posterior right parietal temporal and occipital lobes, consistent with acute posterior right MCA territory infarct. This is positioned immediately adjacent to the chronic right MCA territory infarct. No associated hemorrhage or mass effect. No other areas of acute or subacute infarction. Gray-white matter differentiation otherwise maintained. Multiple chronic micro hemorrhages noted, predominantly localized to the cerebellum, most like related to poorly controlled hypertension. No mass lesion, midline shift or mass effect. No hydrocephalus. No extra-axial fluid  collection. Pituitary gland within normal limits. Vascular: Major intracranial vascular flow voids maintained. Skull and upper cervical spine: Craniocervical junction within normal limits. Visualized upper cervical spine within normal limits. Bone marrow signal intensity normal. No scalp soft tissue abnormality. Sinuses/Orbits: Patient status post bilateral ocular lens replacement. Paranasal sinuses are clear. No mastoid effusion. Inner ear structures grossly normal. Other: None. MRA HEAD FINDINGS ANTERIOR CIRCULATION: Examination degraded by motion artifact. Distal cervical segments of the internal carotid arteries are patent with symmetric antegrade flow. Petrous segments widely patent bilaterally. Cavernous/supraclinoid ICAs demonstrate mild multifocal atheromatous irregularity but are patent without hemodynamically significant stenosis. Right ICA terminus mildly ectatic. A1 segments widely patent bilaterally. Anterior communicating artery not well assessed. Anterior cerebral arteries patent to their distal aspects without proximal high-grade stenosis. M1 segments mildly irregular but are patent to their distal aspects without high-grade stenosis or occlusion. Normal MCA bifurcations. No proximal M2 occlusion. Distal right MCA branches attenuated as compared to the left, like related to chronic right MCA territory infarction. Distal small vessel atheromatous irregularity noted bilaterally. POSTERIOR CIRCULATION: Dominant left vertebral artery patent to the vertebrobasilar junction without stenosis. Right vertebral artery somewhat diminutive with multifocal atheromatous irregularity without high-grade stenosis. Left vertebral artery dominant. Left PICA patent proximally. Right PICA not well visualized. Basilar mildly tortuous with superimposed short-segment mild stenosis at its distal aspect (series 604, image 1). Superior cerebral arteries patent bilaterally. Left PCA supplied via the basilar. Right PCA supplied  via the basilar as well as a prominent right posterior communicating artery. PCAs patent to their distal aspects without flow-limiting stenosis. Distal small vessel atheromatous irregularity. No intracranial aneurysm. IMPRESSION: MRI HEAD IMPRESSION: 1. Acute ischemic right posterior MCA territory infarct as above. No associated hemorrhage or mass effect. 2. Additional chronic right posterior MCA territory infarct, with multiple chronic bilateral cerebellar infarcts, right greater than left. 3. Multiple chronic micro hemorrhages seen scattered throughout the cerebellum, most consistent with chronic poorly controlled hypertension. 4. Underlying age-related cerebral atrophy with advanced chronic microvascular ischemic disease. MRA HEAD IMPRESSION: 1. Negative intracranial MRA for large vessel occlusion. 2. Attenuation of the right MCA branches as compared to the left, likely related to the patient's chronic right MCA territory infarction. 3. Moderate small vessel atheromatous irregularity throughout the intracranial circulation. No proximal high-grade or correctable stenosis identified. Electronically Signed   By: Rise MuBenjamin  McClintock M.D.   On: 08/11/2018 22:33   Dg Chest Port 1 View  Result Date: 08/11/2018 CLINICAL DATA:  Altered mental status. Weakness. Nausea and dizziness. EXAM: PORTABLE CHEST 1 VIEW COMPARISON:  08/07/2018 FINDINGS: The cardiomediastinal silhouette is unchanged with borderline cardiomegaly and extensive mitral annular calcification noted. There is aortic atherosclerosis. Lung volumes are low with mild elevation of the right hemidiaphragm. Medial left retrocardiac density corresponds to a known hiatal hernia. No definite airspace consolidation, sizeable pleural effusion, or pneumothorax is identified. Surgical clips are noted in the lower neck, left axilla, and right upper abdomen. No acute osseous abnormality is seen. IMPRESSION: Low lung volumes without evidence of acute airspace disease.  Electronically Signed   By: Sebastian Ache M.D.   On: 08/11/2018 19:11   Mr Maxine Glenn Head Wo Contrast  Result Date: 08/11/2018 CLINICAL DATA:  Initial evaluation for acute stroke. EXAM: MRI HEAD WITHOUT CONTRAST MRA HEAD WITHOUT CONTRAST TECHNIQUE: Multiplanar, multiecho pulse sequences of the brain and surrounding structures were obtained without intravenous contrast. Angiographic images of the head were obtained using MRA technique without contrast. COMPARISON:  Prior CT from earlier the same day. FINDINGS: MRI HEAD FINDINGS Brain: Generalized age-related cerebral atrophy. Confluent T2/FLAIR hyperintensity within the periventricular deep white matter both cerebral hemispheres most consistent with chronic microvascular ischemic disease, advanced in nature. Encephalomalacia with gliosis within the right parietal region consistent with remote right posterior MCA territory infarct. Multiple additional scattered remote bilateral cerebellar infarcts, right greater than left. Confluent restricted diffusion seen involving the posterior right parietal temporal and occipital lobes, consistent with acute posterior right MCA territory infarct. This is positioned immediately adjacent to the chronic right MCA territory infarct. No associated hemorrhage or mass effect. No other areas of acute or subacute infarction. Gray-white matter differentiation otherwise maintained. Multiple chronic micro hemorrhages noted, predominantly localized to the cerebellum, most like related to poorly controlled hypertension. No mass lesion, midline shift or mass effect. No hydrocephalus. No extra-axial fluid collection. Pituitary gland within normal limits. Vascular: Major intracranial vascular flow voids maintained. Skull and upper cervical spine: Craniocervical junction within normal limits. Visualized upper cervical spine within normal limits. Bone marrow signal intensity normal. No scalp soft tissue abnormality. Sinuses/Orbits: Patient status  post bilateral ocular lens replacement. Paranasal sinuses are clear. No mastoid effusion. Inner ear structures grossly normal. Other: None. MRA HEAD FINDINGS ANTERIOR CIRCULATION: Examination degraded by motion artifact. Distal cervical segments of the internal carotid arteries are patent with symmetric antegrade flow. Petrous segments widely patent bilaterally. Cavernous/supraclinoid ICAs demonstrate mild multifocal atheromatous irregularity but are patent without hemodynamically significant stenosis. Right ICA terminus mildly ectatic. A1 segments widely patent bilaterally. Anterior communicating artery not well assessed. Anterior cerebral arteries patent to their distal aspects without proximal high-grade stenosis. M1 segments mildly irregular but are patent to their distal aspects without high-grade stenosis or occlusion. Normal MCA bifurcations. No proximal M2 occlusion. Distal right MCA branches attenuated as compared to the left, like related to chronic right MCA territory infarction. Distal small vessel atheromatous irregularity noted bilaterally. POSTERIOR CIRCULATION: Dominant left vertebral artery patent to the vertebrobasilar junction without stenosis. Right vertebral artery somewhat diminutive with multifocal atheromatous irregularity without high-grade stenosis. Left vertebral artery dominant. Left PICA patent proximally. Right PICA not well visualized. Basilar mildly tortuous with superimposed short-segment mild stenosis at its distal aspect (series 604, image 1). Superior cerebral arteries patent bilaterally. Left PCA supplied via the basilar. Right PCA supplied via the basilar as well as a prominent right posterior communicating artery. PCAs patent to their distal aspects without flow-limiting stenosis. Distal small vessel atheromatous irregularity. No intracranial aneurysm. IMPRESSION: MRI HEAD IMPRESSION: 1.  Acute ischemic right posterior MCA territory infarct as above. No associated hemorrhage or  mass effect. 2. Additional chronic right posterior MCA territory infarct, with multiple chronic bilateral cerebellar infarcts, right greater than left. 3. Multiple chronic micro hemorrhages seen scattered throughout the cerebellum, most consistent with chronic poorly controlled hypertension. 4. Underlying age-related cerebral atrophy with advanced chronic microvascular ischemic disease. MRA HEAD IMPRESSION: 1. Negative intracranial MRA for large vessel occlusion. 2. Attenuation of the right MCA branches as compared to the left, likely related to the patient's chronic right MCA territory infarction. 3. Moderate small vessel atheromatous irregularity throughout the intracranial circulation. No proximal high-grade or correctable stenosis identified. Electronically Signed   By: Rise Mu M.D.   On: 08/11/2018 22:33   EKG Interpretation  Date/Time:  Friday August 11 2018 18:45:59 EST Ventricular Rate:  84 PR Interval:    QRS Duration: 91 QT Interval:  422 QTC Calculation: 499 R Axis:   40 Text Interpretation:  Normal sinus rhythm Non-specific ST-t changes with t wave inversion in v5  new from last tracing 4 days ago Confirmed by Margarita Grizzle [phone]) on 08/11/2018 7:37:14 PM   Pending Labs Unresulted Labs (From admission, onward)    Start     Ordered   08/18/18 0500  Creatinine, serum  (enoxaparin (LOVENOX)    CrCl >/= 30 ml/min)  Weekly,   R    Comments:  while on enoxaparin therapy    08/11/18 2047   08/12/18 0500  Hemoglobin A1c  Tomorrow morning,   R     08/11/18 2047   08/12/18 0500  Lipid panel  Tomorrow morning,   R    Comments:  Fasting    08/11/18 2047   08/11/18 1802  Urinalysis, Routine w reflex microscopic  Once,   R     08/11/18 1802          Vitals/Pain Today's Vitals   08/11/18 1754 08/11/18 1900 08/11/18 2154 08/11/18 2230  BP: (!) 134/91 (!) 146/93 (!) 150/86 137/69  Pulse: 84 82 83 88  Resp: 18 (!) 23 20 18   SpO2: 96% 96% 98% 99%    Isolation  Precautions No active isolations  Medications Medications   stroke: mapping our early stages of recovery book (has no administration in time range)  0.9 %  sodium chloride infusion (has no administration in time range)  acetaminophen (TYLENOL) tablet 650 mg (has no administration in time range)    Or  acetaminophen (TYLENOL) solution 650 mg (has no administration in time range)    Or  acetaminophen (TYLENOL) suppository 650 mg (has no administration in time range)  senna-docusate (Senokot-S) tablet 1 tablet (has no administration in time range)  enoxaparin (LOVENOX) injection 40 mg (has no administration in time range)  aspirin suppository 300 mg (has no administration in time range)    Or  aspirin tablet 325 mg (has no administration in time range)  ALPRAZolam (XANAX) tablet 0.25 mg (has no administration in time range)  atorvastatin (LIPITOR) tablet 10 mg (has no administration in time range)  levothyroxine (SYNTHROID, LEVOTHROID) injection 12.5 mcg (has no administration in time range)  sodium chloride 0.9 % bolus 500 mL (0 mLs Intravenous Stopped 08/11/18 2149)    Mobility non-ambulatory

## 2018-08-11 NOTE — ED Notes (Signed)
XR at bedside

## 2018-08-12 ENCOUNTER — Inpatient Hospital Stay (HOSPITAL_BASED_OUTPATIENT_CLINIC_OR_DEPARTMENT_OTHER): Payer: Medicare Other

## 2018-08-12 ENCOUNTER — Other Ambulatory Visit: Payer: Self-pay

## 2018-08-12 ENCOUNTER — Other Ambulatory Visit (HOSPITAL_COMMUNITY): Payer: Medicare Other

## 2018-08-12 ENCOUNTER — Inpatient Hospital Stay (HOSPITAL_COMMUNITY): Payer: Medicare Other

## 2018-08-12 DIAGNOSIS — I361 Nonrheumatic tricuspid (valve) insufficiency: Secondary | ICD-10-CM

## 2018-08-12 DIAGNOSIS — R531 Weakness: Secondary | ICD-10-CM

## 2018-08-12 DIAGNOSIS — I639 Cerebral infarction, unspecified: Secondary | ICD-10-CM

## 2018-08-12 DIAGNOSIS — I340 Nonrheumatic mitral (valve) insufficiency: Secondary | ICD-10-CM

## 2018-08-12 LAB — CBC WITH DIFFERENTIAL/PLATELET
Abs Immature Granulocytes: 0.06 10*3/uL (ref 0.00–0.07)
BASOS PCT: 0 %
Basophils Absolute: 0 10*3/uL (ref 0.0–0.1)
Eosinophils Absolute: 0 10*3/uL (ref 0.0–0.5)
Eosinophils Relative: 0 %
HCT: 39.8 % (ref 36.0–46.0)
Hemoglobin: 13 g/dL (ref 12.0–15.0)
Immature Granulocytes: 1 %
Lymphocytes Relative: 9 %
Lymphs Abs: 0.9 10*3/uL (ref 0.7–4.0)
MCH: 30.2 pg (ref 26.0–34.0)
MCHC: 32.7 g/dL (ref 30.0–36.0)
MCV: 92.3 fL (ref 80.0–100.0)
Monocytes Absolute: 0.9 10*3/uL (ref 0.1–1.0)
Monocytes Relative: 9 %
Neutro Abs: 7.7 10*3/uL (ref 1.7–7.7)
Neutrophils Relative %: 81 %
Platelets: 248 10*3/uL (ref 150–400)
RBC: 4.31 MIL/uL (ref 3.87–5.11)
RDW: 12.4 % (ref 11.5–15.5)
WBC: 9.6 10*3/uL (ref 4.0–10.5)
nRBC: 0 % (ref 0.0–0.2)

## 2018-08-12 LAB — TSH: TSH: 0.525 u[IU]/mL (ref 0.350–4.500)

## 2018-08-12 LAB — COMPREHENSIVE METABOLIC PANEL
ALT: 16 U/L (ref 0–44)
ANION GAP: 15 (ref 5–15)
AST: 26 U/L (ref 15–41)
Albumin: 3.6 g/dL (ref 3.5–5.0)
Alkaline Phosphatase: 57 U/L (ref 38–126)
BUN: 6 mg/dL — ABNORMAL LOW (ref 8–23)
CO2: 19 mmol/L — ABNORMAL LOW (ref 22–32)
Calcium: 9.1 mg/dL (ref 8.9–10.3)
Chloride: 106 mmol/L (ref 98–111)
Creatinine, Ser: 0.61 mg/dL (ref 0.44–1.00)
GFR calc Af Amer: >60 mL/min (ref >60)
GFR calc non Af Amer: >60 mL/min (ref >60)
Glucose, Bld: 111 mg/dL — ABNORMAL HIGH (ref 70–99)
POTASSIUM: 3.4 mmol/L — AB (ref 3.5–5.1)
Sodium: 140 mmol/L (ref 135–145)
Total Bilirubin: 1.2 mg/dL (ref 0.3–1.2)
Total Protein: 6.8 g/dL (ref 6.5–8.1)

## 2018-08-12 LAB — ECHOCARDIOGRAM COMPLETE
Height: 53 in
Weight: 1421.53 oz

## 2018-08-12 LAB — T4, FREE: FREE T4: 1.42 ng/dL (ref 0.82–1.77)

## 2018-08-12 LAB — TROPONIN I
Troponin I: <0.03 ng/mL (ref <0.03)
Troponin I: <0.03 ng/mL (ref <0.03)

## 2018-08-12 LAB — MAGNESIUM: Magnesium: 1.6 mg/dL — ABNORMAL LOW (ref 1.7–2.4)

## 2018-08-12 LAB — BRAIN NATRIURETIC PEPTIDE: B Natriuretic Peptide: 418.9 pg/mL — ABNORMAL HIGH (ref 0.0–100.0)

## 2018-08-12 MED ORDER — DILTIAZEM LOAD VIA INFUSION
10.0000 mg | Freq: Once | INTRAVENOUS | Status: DC
  Filled 2018-08-12: qty 10

## 2018-08-12 MED ORDER — POTASSIUM CHLORIDE CRYS ER 20 MEQ PO TBCR
40.0000 meq | EXTENDED_RELEASE_TABLET | Freq: Once | ORAL | Status: AC
  Administered 2018-08-12: 40 meq via ORAL
  Filled 2018-08-12: qty 2

## 2018-08-12 MED ORDER — MAGNESIUM SULFATE 2 GM/50ML IV SOLN
2.0000 g | Freq: Once | INTRAVENOUS | Status: AC
  Administered 2018-08-12: 2 g via INTRAVENOUS
  Filled 2018-08-12: qty 50

## 2018-08-12 MED ORDER — ASPIRIN 300 MG RE SUPP
300.0000 mg | Freq: Every day | RECTAL | Status: DC
  Filled 2018-08-12: qty 1

## 2018-08-12 MED ORDER — METOPROLOL TARTRATE 5 MG/5ML IV SOLN
INTRAVENOUS | Status: AC
  Filled 2018-08-12: qty 5

## 2018-08-12 MED ORDER — DILTIAZEM HCL-DEXTROSE 100-5 MG/100ML-% IV SOLN (PREMIX)
5.0000 mg/h | INTRAVENOUS | Status: DC
  Filled 2018-08-12: qty 100

## 2018-08-12 MED ORDER — ASPIRIN 325 MG PO TABS
325.0000 mg | ORAL_TABLET | Freq: Every day | ORAL | Status: DC
  Administered 2018-08-13 – 2018-08-15 (×3): 325 mg via ORAL
  Filled 2018-08-12 (×3): qty 1

## 2018-08-12 MED ORDER — METOPROLOL TARTRATE 5 MG/5ML IV SOLN: 5.0000 mg | INTRAVENOUS | Status: DC | PRN

## 2018-08-12 MED ORDER — METOPROLOL TARTRATE 5 MG/5ML IV SOLN
5.0000 mg | Freq: Once | INTRAVENOUS | Status: AC
  Administered 2018-08-12: 5 mg via INTRAVENOUS

## 2018-08-12 MED ORDER — SODIUM CHLORIDE 0.9 % IV SOLN
INTRAVENOUS | Status: DC
  Administered 2018-08-12 (×2): via INTRAVENOUS

## 2018-08-12 MED ORDER — LEVOTHYROXINE SODIUM 25 MCG PO TABS
25.0000 ug | ORAL_TABLET | Freq: Every day | ORAL | Status: DC
  Administered 2018-08-13 – 2018-08-15 (×3): 25 ug via ORAL
  Filled 2018-08-12 (×4): qty 1

## 2018-08-12 MED ORDER — SODIUM CHLORIDE 0.9 % IV BOLUS
1000.0000 mL | Freq: Once | INTRAVENOUS | Status: AC
  Administered 2018-08-12: 1000 mL via INTRAVENOUS

## 2018-08-12 MED ORDER — ENSURE ENLIVE PO LIQD
237.0000 mL | Freq: Two times a day (BID) | ORAL | Status: DC
  Administered 2018-08-13 – 2018-08-15 (×4): 237 mL via ORAL

## 2018-08-12 MED ORDER — METOPROLOL TARTRATE 50 MG PO TABS
50.0000 mg | ORAL_TABLET | Freq: Two times a day (BID) | ORAL | Status: DC
  Administered 2018-08-12 – 2018-08-15 (×7): 50 mg via ORAL
  Filled 2018-08-12 (×7): qty 1

## 2018-08-12 NOTE — Consult Note (Signed)
Referring Physician: Dr. Allena KatzPatel    Chief Complaint: Left upper extremity weakness  HPI: Leah Figueroa is an 84 y.o. female who [facility] EMS with c/c of left sided weakness, nausea and dizziness, the latter worsening with movement. Of note, she has a history of stroke with residual left sided weakness. She uses a wheelchair and has a history of dementia. Vitals [facility] were essentially unremarkable. She had initially [facility] with LUE weakness.  CT head at that time showed chronic moderate small vessel ischemic disease and chronic right posterior parietal and right cerebellar infarcts without acute infarction. Her presentation at that time was discussed with Neurology and given lack of a definite left sided deficit on EDP exam, she was not a potential tPA candidate. Also was not a candidate for VIR given dementia at baseline in addition to no findings on exam c/w with LVO. She was not a good candidate for anticoagulation given her falls risk. MRI was determined to be an option for further evaluation at that time. EDP had a long discussion with the patient's daughter and caregiver, who decided to return home without MRI and to obtain follow up with the patient's PCP. She has had decreased ability to walk since her ED evaluation earlier this week. She has become increasingly weak, dizzy, and confused and has had decreased p.o. intake yesterday and today. Tuesday-Thursday the patient seemed weaker but not specifically worse.  Today (Friday) she had more confusion, nauseated , 'dry heaving", would not take po including medications and had decreased urination.  [facility] early this shift showed a late acute to early subacute right MCA territory infarct with loss of gray-white matter distinction involving the right temporal and parietal lobes. The patient was [facility] for stroke evaluation and management.   Her PMHx includes HTN, HLD, prior right  MCA CVA, CAD with acute MI, hypothyroidism, breast CA s/p lumpectomy and dementia.  Past Medical History:  Diagnosis Date  . Acute myocardial infarction of other anterior wall, episode of care unspecified   . Arthritis   . Coronary artery disease   . Other and unspecified hyperlipidemia   . Personal history of arthritis   . Personal history of malignant neoplasm of breast    s/p lumpectomy  . Stroke [facility] 03/2015   "left sided issues"  . Unspecified essential hypertension   . Unspecified hypothyroidism     Past Surgical History:  Procedure Laterality Date  . BACK SURGERY    . CATARACT EXTRACTION     left-2006, right 2011  . CHOLECYSTECTOMY    . intrathoracic goiter resection    . lumpectomy for breast cancer Left 80's   radiation therapy  . right thyroid lobectomy     Hx Goiter    Family History  Problem Relation Age of Onset  . Colon cancer Mother   . Breast cancer Mother   . Coronary artery disease Brother   . Dementia Sister    Social History:  reports that she has never smoked. She has never used smokeless tobacco. She reports that she does not drink alcohol or use drugs.  Allergies: No Known Allergies  Medications:  Prior to Admission:  Medications Prior to Admission  Medication Sig Dispense Refill Last Dose  . ALPRAZolam (XANAX) 0.25 MG tablet Take 0.25 mg by mouth every morning.   0 08/10/2018 at Unknown time  . amLODipine (NORVASC) 2.5 MG tablet  Take 7.5 mg by mouth daily.   6 08/10/2018 at Unknown time  . atorvastatin (LIPITOR) 10 MG tablet Take 10 mg by mouth at bedtime.    08/10/2018 at Unknown time  . clopidogrel (PLAVIX) 75 MG tablet Take 1 tablet (75 mg total) by mouth daily.   08/10/2018 at 1000  . levothyroxine (SYNTHROID, LEVOTHROID) 25 MCG tablet Take 1 tablet by mouth  daily (Patient taking differently: Take 25 mcg by mouth daily. ) 90 tablet 0 08/10/2018 at Unknown time  . Melatonin 10 MG TABS Take 10 mg by mouth at bedtime.    08/10/2018 at  Unknown time  . metoprolol (LOPRESSOR) 50 MG tablet Take 50 mg by mouth 2 (two) times daily.  3 08/10/2018 at 1830  . acetaminophen (TYLENOL) 325 MG tablet Take 2 tablets (650 mg total) by mouth every 4 (four) hours as needed for mild pain. (Patient not taking: Reported on 08/07/2018)   Completed Course at Unknown time  . bisacodyl (DULCOLAX) 5 MG EC tablet Take 1 tablet (5 mg total) by mouth daily as needed for moderate constipation. (Patient not taking: Reported on 08/07/2018) 30 tablet 0 Completed Course at Unknown time  . meclizine (ANTIVERT) 25 MG tablet Take 1 tablet (25 mg total) by mouth 3 (three) times daily. (Patient not taking: Reported on 08/07/2018) 30 tablet 0 Not Taking at Unknown time  . ondansetron (ZOFRAN) 8 MG tablet Take 1 tablet (8 mg total) by mouth every 8 (eight) hours as needed for nausea or vomiting. (Patient not taking: Reported on 08/07/2018) 10 tablet 0 Completed Course at Unknown time  . polyethylene glycol (MIRALAX / GLYCOLAX) packet Take 17 g by mouth at bedtime. (Patient not taking: Reported on 08/07/2018) 14 each 0 Not Taking at Unknown time   Scheduled: . ALPRAZolam  0.25 mg Oral QHS  . aspirin  300 mg Rectal Daily   Or  . aspirin  325 mg Oral Daily  . atorvastatin  10 mg Oral QHS  . enoxaparin (LOVENOX) injection  40 mg Subcutaneous Q24H  . levothyroxine  12.5 mcg Intravenous Daily   Continuous: . sodium chloride 75 mL/hr at 08/12/18 0057    ROS: No F/C but has felt hot to touch per daughter. Decreased po intake. Diffuse weakness with dizziness and confusion.   Physical Examination: Blood pressure 133/85, pulse 87, temperature 97.7 F (36.5 C), temperature source Oral, resp. rate 20, height 4\' 5"  (1.346 m), weight 40.3 kg, SpO2 96 %.  HEENT: [place]/AT Lungs: Respirations unlabored Ext: Warm and well perfused  Neurologic Examination: Ment: Awake and alert. Verbal output is garbled at times, fluent at others but in general unrelated to the situation. She  initially did not respond correctly when asked about her situation, then stated that she was sick and was in the hospital. Stated her age incorrectly as 68. Does not answer questions when asked to name objects. Does not follow commands. Stimulus-bound behavior noted.  CN: PERRL. Directs gaze towards visual stimuli in right and left visual fields. EOMI. Decreased NL fold on the left. Phonation intact.  Motor: Moves all 4 extremities to tactile or light noxious stimulation, with somewhat less brisk responses on the left.  Sensory: As above.  Reflexes: 1-2+ bilateral upper extremities and patellae Cerebellar: Does not follow commands for FNF Gait: Deferred  Results for orders placed or performed during the hospital encounter of 08/11/18 (from the past 48 hour(s))  Urinalysis, Routine w reflex microscopic     Status: Abnormal   Collection Time:  08/11/18  6:02 PM  Result Value Ref Range   Color, Urine YELLOW YELLOW   APPearance CLEAR CLEAR   Specific Gravity, Urine 1.017 1.005 - 1.030   pH 5.0 5.0 - 8.0   Glucose, UA NEGATIVE NEGATIVE mg/dL   Hgb urine dipstick MODERATE (A) NEGATIVE   Bilirubin Urine NEGATIVE NEGATIVE   Ketones, ur 80 (A) NEGATIVE mg/dL   Protein, ur NEGATIVE NEGATIVE mg/dL   Nitrite NEGATIVE NEGATIVE   Leukocytes, UA NEGATIVE NEGATIVE   RBC / HPF 0-5 0 - 5 RBC/hpf   WBC, UA 0-5 0 - 5 WBC/hpf   Bacteria, UA NONE SEEN NONE SEEN   Squamous Epithelial / LPF 0-5 0 - 5   Mucus PRESENT    Hyaline Casts, UA PRESENT     Comment: Performed at [facility], [address] [place], [place] [address]  CBC     Status: None   Collection Time: 08/11/18  6:26 PM  Result Value Ref Range   WBC 8.2 4.0 - 10.5 K/uL   RBC 4.40 3.87 - 5.11 MIL/uL   Hemoglobin 14.0 12.0 - 15.0 g/dL   HCT 62.142.1 30.836.0 - 65.746.0 %   MCV 95.7 80.0 - 100.0 fL   MCH 31.8 26.0 - 34.0 pg   MCHC 33.3 30.0 - 36.0 g/dL   RDW 84.612.7 96.211.5 - 95.215.5 %   Platelets 243 150 - 400 K/uL   nRBC 0.0 0.0 - 0.2 %     Comment: Performed at [facility], [address] [place], [place] [address]  Comprehensive metabolic panel     Status: Abnormal   Collection Time: 08/11/18  6:26 PM  Result Value Ref Range   Sodium 140 135 - 145 mmol/L   Potassium 3.8 3.5 - 5.1 mmol/L   Chloride 104 98 - 111 mmol/L   CO2 22 22 - 32 mmol/L   Glucose, Bld 133 (H) 70 - 99 mg/dL   BUN 13 8 - 23 mg/dL   Creatinine, Ser 4.400.59 0.44 - 1.00 mg/dL   Calcium 9.1 8.9 - 10.210.3 mg/dL   Total Protein 7.8 6.5 - 8.1 g/dL   Albumin 4.1 3.5 - 5.0 g/dL   AST 28 15 - 41 U/L   ALT 16 0 - 44 U/L   Alkaline Phosphatase 63 38 - 126 U/L   Total Bilirubin 1.5 (H) 0.3 - 1.2 mg/dL   GFR calc non Af Amer >60 >60 mL/min   GFR calc Af Amer >60 >60 mL/min   Anion gap 14 5 - 15    Comment: Performed at [facility], [address] [place], [place] [address]   Dg Abd 1 View  Result Date: 08/11/2018 CLINICAL DATA:  Nausea. EXAM: ABDOMEN - 1 VIEW COMPARISON:  None. FINDINGS: The bowel gas pattern is normal. Status post cholecystectomy. No radio-opaque calculi or other significant radiographic abnormality are seen. IMPRESSION: No evidence of bowel obstruction or ileus. Electronically Signed   By: Lupita RaiderJames  Green Jr, M.D.   On: 08/11/2018 21:15   Ct Head Wo Contrast  Result Date: 08/11/2018 CLINICAL DATA:  Nausea and dizziness, worse with movement. EXAM: CT HEAD WITHOUT CONTRAST TECHNIQUE: Contiguous axial images were obtained from the base of the skull through the vertex without intravenous contrast. COMPARISON:  08/07/2018 FINDINGS: Brain: Interval development of right nonhemorrhagic MCA territory infarct with loss of gray-white matter distinction involving the right temporal and parietal lobes. This is superimposed upon chronic moderate microvascular ischemic disease periventricular, deep and subcortical  white matter. No hydrocephalus midline shift. Chronic remote right cerebellar infarct. Midline fourth ventricle  and basal cisterns. No intra-axial mass nor extra-axial fluid. Vascular: Atherosclerosis of the carotid siphons. No hyperdense vessels noted. Skull: No acute skull fracture. No suspicious osseous lesions. Sinuses/Orbits: No acute finding. Other: None. IMPRESSION: 1. Late acute to early subacute right MCA territory infarct with loss of gray-white matter distinction involving the right temporal and parietal lobes. No hemorrhage noted. These results were called by telephone at the time of interpretation on 08/11/2018 at 7:31 pm to Dr. Margarita Grizzle , who verbally acknowledged these results. 2. Chronic moderate small vessel ischemic disease of periventricular, deep and subcortical white matter. 3. Chronic right cerebellar infarct. Electronically Signed   By: Tollie Eth M.D.   On: 08/11/2018 19:29   Mr Brain Wo Contrast  Result Date: 08/11/2018 CLINICAL DATA:  Initial evaluation for acute stroke. EXAM: MRI HEAD WITHOUT CONTRAST MRA HEAD WITHOUT CONTRAST TECHNIQUE: Multiplanar, multiecho pulse sequences of the brain and surrounding structures were obtained without intravenous contrast. Angiographic images of the head were obtained using MRA technique without contrast. COMPARISON:  Prior CT from earlier the same day. FINDINGS: MRI HEAD FINDINGS Brain: Generalized age-related cerebral atrophy. Confluent T2/FLAIR hyperintensity within the periventricular deep white matter both cerebral hemispheres most consistent with chronic microvascular ischemic disease, advanced in nature. Encephalomalacia with gliosis within the right parietal region consistent with remote right posterior MCA territory infarct. Multiple additional scattered remote bilateral cerebellar infarcts, right greater than left. Confluent restricted diffusion seen involving the posterior right parietal temporal and occipital lobes, consistent with acute posterior right MCA territory infarct. This is positioned immediately adjacent to the chronic right MCA  territory infarct. No associated hemorrhage or mass effect. No other areas of acute or subacute infarction. Gray-white matter differentiation otherwise maintained. Multiple chronic micro hemorrhages noted, predominantly localized to the cerebellum, most like related to poorly controlled hypertension. No mass lesion, midline shift or mass effect. No hydrocephalus. No extra-axial fluid collection. Pituitary gland within normal limits. Vascular: Major intracranial vascular flow voids maintained. Skull and upper cervical spine: Craniocervical junction within normal limits. Visualized upper cervical spine within normal limits. Bone marrow signal intensity normal. No scalp soft tissue abnormality. Sinuses/Orbits: Patient status post bilateral ocular lens replacement. Paranasal sinuses are clear. No mastoid effusion. Inner ear structures grossly normal. Other: None. MRA HEAD FINDINGS ANTERIOR CIRCULATION: Examination degraded by motion artifact. Distal cervical segments of the internal carotid arteries are patent with symmetric antegrade flow. Petrous segments widely patent bilaterally. Cavernous/supraclinoid ICAs demonstrate mild multifocal atheromatous irregularity but are patent without hemodynamically significant stenosis. Right ICA terminus mildly ectatic. A1 segments widely patent bilaterally. Anterior communicating artery not well assessed. Anterior cerebral arteries patent to their distal aspects without proximal high-grade stenosis. M1 segments mildly irregular but are patent to their distal aspects without high-grade stenosis or occlusion. Normal MCA bifurcations. No proximal M2 occlusion. Distal right MCA branches attenuated as compared to the left, like related to chronic right MCA territory infarction. Distal small vessel atheromatous irregularity noted bilaterally. POSTERIOR CIRCULATION: Dominant left vertebral artery patent to the vertebrobasilar junction without stenosis. Right vertebral artery somewhat  diminutive with multifocal atheromatous irregularity without high-grade stenosis. Left vertebral artery dominant. Left PICA patent proximally. Right PICA not well visualized. Basilar mildly tortuous with superimposed short-segment mild stenosis at its distal aspect (series 604, image 1). Superior cerebral arteries patent bilaterally. Left PCA supplied via the basilar. Right PCA supplied via the basilar as well as a prominent right posterior  communicating artery. PCAs patent to their distal aspects without flow-limiting stenosis. Distal small vessel atheromatous irregularity. No intracranial aneurysm. IMPRESSION: MRI HEAD IMPRESSION: 1. Acute ischemic right posterior MCA territory infarct as above. No associated hemorrhage or mass effect. 2. Additional chronic right posterior MCA territory infarct, with multiple chronic bilateral cerebellar infarcts, right greater than left. 3. Multiple chronic micro hemorrhages seen scattered throughout the cerebellum, most consistent with chronic poorly controlled hypertension. 4. Underlying age-related cerebral atrophy with advanced chronic microvascular ischemic disease. MRA HEAD IMPRESSION: 1. Negative intracranial MRA for large vessel occlusion. 2. Attenuation of the right MCA branches as compared to the left, likely related to the patient's chronic right MCA territory infarction. 3. Moderate small vessel atheromatous irregularity throughout the intracranial circulation. No proximal high-grade or correctable stenosis identified. Electronically Signed   By: Rise Mu M.D.   On: 08/11/2018 22:33   Dg Chest Port 1 View  Result Date: 08/11/2018 CLINICAL DATA:  Altered mental status. Weakness. Nausea and dizziness. EXAM: PORTABLE CHEST 1 VIEW COMPARISON:  08/07/2018 FINDINGS: The cardiomediastinal silhouette is unchanged with borderline cardiomegaly and extensive mitral annular calcification noted. There is aortic atherosclerosis. Lung volumes are low with mild  elevation of the right hemidiaphragm. Medial left retrocardiac density corresponds to a known hiatal hernia. No definite airspace consolidation, sizeable pleural effusion, or pneumothorax is identified. Surgical clips are noted in the lower neck, left axilla, and right upper abdomen. No acute osseous abnormality is seen. IMPRESSION: Low lung volumes without evidence of acute airspace disease. Electronically Signed   By: Sebastian Ache M.D.   On: 08/11/2018 19:11   Mr Maxine Glenn Head Wo Contrast  Result Date: 08/11/2018 CLINICAL DATA:  Initial evaluation for acute stroke. EXAM: MRI HEAD WITHOUT CONTRAST MRA HEAD WITHOUT CONTRAST TECHNIQUE: Multiplanar, multiecho pulse sequences of the brain and surrounding structures were obtained without intravenous contrast. Angiographic images of the head were obtained using MRA technique without contrast. COMPARISON:  Prior CT from earlier the same day. FINDINGS: MRI HEAD FINDINGS Brain: Generalized age-related cerebral atrophy. Confluent T2/FLAIR hyperintensity within the periventricular deep white matter both cerebral hemispheres most consistent with chronic microvascular ischemic disease, advanced in nature. Encephalomalacia with gliosis within the right parietal region consistent with remote right posterior MCA territory infarct. Multiple additional scattered remote bilateral cerebellar infarcts, right greater than left. Confluent restricted diffusion seen involving the posterior right parietal temporal and occipital lobes, consistent with acute posterior right MCA territory infarct. This is positioned immediately adjacent to the chronic right MCA territory infarct. No associated hemorrhage or mass effect. No other areas of acute or subacute infarction. Gray-white matter differentiation otherwise maintained. Multiple chronic micro hemorrhages noted, predominantly localized to the cerebellum, most like related to poorly controlled hypertension. No mass lesion, midline shift or  mass effect. No hydrocephalus. No extra-axial fluid collection. Pituitary gland within normal limits. Vascular: Major intracranial vascular flow voids maintained. Skull and upper cervical spine: Craniocervical junction within normal limits. Visualized upper cervical spine within normal limits. Bone marrow signal intensity normal. No scalp soft tissue abnormality. Sinuses/Orbits: Patient status post bilateral ocular lens replacement. Paranasal sinuses are clear. No mastoid effusion. Inner ear structures grossly normal. Other: None. MRA HEAD FINDINGS ANTERIOR CIRCULATION: Examination degraded by motion artifact. Distal cervical segments of the internal carotid arteries are patent with symmetric antegrade flow. Petrous segments widely patent bilaterally. Cavernous/supraclinoid ICAs demonstrate mild multifocal atheromatous irregularity but are patent without hemodynamically significant stenosis. Right ICA terminus mildly ectatic. A1 segments widely patent bilaterally. Anterior communicating artery not well  assessed. Anterior cerebral arteries patent to their distal aspects without proximal high-grade stenosis. M1 segments mildly irregular but are patent to their distal aspects without high-grade stenosis or occlusion. Normal MCA bifurcations. No proximal M2 occlusion. Distal right MCA branches attenuated as compared to the left, like related to chronic right MCA territory infarction. Distal small vessel atheromatous irregularity noted bilaterally. POSTERIOR CIRCULATION: Dominant left vertebral artery patent to the vertebrobasilar junction without stenosis. Right vertebral artery somewhat diminutive with multifocal atheromatous irregularity without high-grade stenosis. Left vertebral artery dominant. Left PICA patent proximally. Right PICA not well visualized. Basilar mildly tortuous with superimposed short-segment mild stenosis at its distal aspect (series 604, image 1). Superior cerebral arteries patent bilaterally.  Left PCA supplied via the basilar. Right PCA supplied via the basilar as well as a prominent right posterior communicating artery. PCAs patent to their distal aspects without flow-limiting stenosis. Distal small vessel atheromatous irregularity. No intracranial aneurysm. IMPRESSION: MRI HEAD IMPRESSION: 1. Acute ischemic right posterior MCA territory infarct as above. No associated hemorrhage or mass effect. 2. Additional chronic right posterior MCA territory infarct, with multiple chronic bilateral cerebellar infarcts, right greater than left. 3. Multiple chronic micro hemorrhages seen scattered throughout the cerebellum, most consistent with chronic poorly controlled hypertension. 4. Underlying age-related cerebral atrophy with advanced chronic microvascular ischemic disease. MRA HEAD IMPRESSION: 1. Negative intracranial MRA for large vessel occlusion. 2. Attenuation of the right MCA branches as compared to the left, likely related to the patient's chronic right MCA territory infarction. 3. Moderate small vessel atheromatous irregularity throughout the intracranial circulation. No proximal high-grade or correctable stenosis identified. Electronically Signed   By: Rise Mu M.D.   On: 08/11/2018 22:33    Assessment: 84 y.o. female with large right temporal and parietal lobe ischemic infarction.  1. CT at the [facility] ED early this shift showed a late acute to early subacute right MCA territory infarct with loss of gray-white matter distinction involving the right temporal and parietal lobes. The patient was transferred to [facility] for stroke evaluation and management.  2. MRI brain: Acute ischemic right posterior MCA territory infarct. Chronic right posterior MCA territory infarct. Multiple chronic bilateral cerebellar infarcts, right greater than left. Multiple chronic micro hemorrhages seen scattered throughout the cerebellum, most consistent with chronic poorly controlled hypertension. Underlying  age-related cerebral atrophy with advanced chronic microvascular ischemic disease.  3. MRA head is negative for large vessel occlusion. There is attenuation of the right MCA branches as compared to the left, likely related to the patient's chronic right MCA territory infarction. Moderate small vessel atheromatous irregularity throughout the intracranial circulation is noted. No proximal high-grade or correctable stenosis identified. 4. History of stroke with residual left sided weakness.  5. Stroke Risk Factors - HTN, HLD, prior right MCA CVA, CAD with acute MI and breast CA 6. Dementia.  Plan: 1. TTE 2. Carotid ultrasound 3. PT consult, OT consult, Speech consult 4. HgbA1c, fasting lipid panel 5. Prophylactic therapy- Has been switched to ASA. Would consider restarting her Plavix for DAPT as she has failed antiplatelet monotherapy 6. On low dose atorvastatin 7. Telemetry monitoring 8. Frequent neuro checks 9. BP management   @Electronically  signed: Dr. Caryl Pina 08/12/2018, 2:32 AM

## 2018-08-12 NOTE — Progress Notes (Signed)
Pt a 84 yrs old white female  Transferred from [facility] with  Possible stroke . Pt alert and oriented to person place and month  With occasional confusion, MAE  Cardiac monitor applied and verification completed pt and family oriented to room. Pt DNR.Marland Kitchen. will continue to monitor.

## 2018-08-12 NOTE — Progress Notes (Addendum)
[facility] Progress Note  Patient: Leah Figueroa ZOX:[id number]   PCP: Merlene Laughter, MD DOB: 1923/02/06   DOA: 08/11/2018   DOS: 08/12/2018   Date of Service: the patient was seen and examined on 08/12/2018  Brief hospital course: Pt. with PMH of  hypertension, CAD, Hx of right MCA stroke, hypothyroidism, and dementia; admitted on 08/11/2018, presented with complaint of left sided weakness, was found to have subacute CVA. Currently further plan is further stroke work up.  Subjective: Patient is hard of hearing.  Also confused.  Denies any acute complaint.  No nausea no vomiting no fever no chills. Patient become tachycardic later on went into A. fib with RVR followed by wide-complex tachycardia which responded to IV Lopressor.  Assessment and Plan: Acute to subacute right MCA territory ischemic infarct: Likely occurred prior to recent ED visit 08/07/2018 at which time CT head did not show acute infarct.   She has been on Plavix as an outpatient but unable to maintain oral intake the last 2 days. -Aspirin 300 mg rectally -MRI shows acute right posterior MCA territory infarct without any hemorrhage or mass-effect along with chronic right posterior MCA infarct as well as cerebellar infarct. MRA head negative for any intracranial MRA large vessel occlusion. -Echocardiogram shows preserved EF, no significant valvular abnormality, carotid Dopplers -Continue statin -SLP/PT/OT eval -Permissive hypertension -Maintenance IV fluids  Paroxysmal A. fib with RVR. Wide-complex tachycardia, sustained V. tach with pulse. Patient initially had some sinus tachycardia which converted into A. fib with RVR not endothelial. From occasional PVCs patient started exhibiting frequent wide-complex tachycardia although initially suspected to be A. fib with aberrancy likely it appears to be a V. Tach. Hear rate 200.  Echocardiogram shows preserved EF. Magnesium was low which was replaced. Potassium is low  which will be replaced. Patient was given IV Lopressor with which the pt converted to sinus rhythm  Continue lopressor and IV lopressor PRN.   Hypertension: -Hold home amlodipine and continue metoprolol for permissive hypertension  Hypothyroidism: Continue Synthroid.  Abdominal pain Chronic constipation. Patient with mild left-sided abdominal pain, question of constipation. -KUB ordered and negative for evidence of bowel obstruction or ileus Continue MiraLAX.  Diet: Regular diet DVT Prophylaxis: subcutaneous Heparin  Advance goals of care discussion: DNR DNI  Family Communication: family was present at bedside, at the time of interview. The pt provided permission to discuss medical plan with the family. Opportunity was given to ask question and all questions were answered satisfactorily.   Disposition:  Discharge to be determined.  Consultants: neurlogy  Procedures: Echocardiogram   Scheduled Meds: . ALPRAZolam  0.25 mg Oral QHS  . aspirin  300 mg Rectal Daily   Or  . aspirin  325 mg Oral Daily  . atorvastatin  10 mg Oral QHS  . enoxaparin (LOVENOX) injection  40 mg Subcutaneous Q24H  . levothyroxine  12.5 mcg Intravenous Daily   Continuous Infusions: . sodium chloride 75 mL/hr at 08/12/18 0057   PRN Meds: acetaminophen **OR** acetaminophen (TYLENOL) oral liquid 160 mg/5 mL **OR** acetaminophen, senna-docusate Antibiotics: Anti-infectives (From admission, onward)   None       Objective: Physical Exam: Vitals:   08/12/18 0205 08/12/18 0405 08/12/18 0605 08/12/18 0735  BP: 133/85 (!) 142/80 (!) 141/73 (!) 151/72  Pulse: 87 85 (!) 114 85  Resp: 20 16 13  (!) 23  Temp: 97.7 F (36.5 C) 98 F (36.7 C) 98.2 F (36.8 C) 98 F (36.7 C)  TempSrc: Oral Oral Oral Axillary  SpO2:  93% 93% 93%  Weight:      Height:        Intake/Output Summary (Last 24 hours) at 08/12/2018 0813 Last data filed at 08/12/2018 0600 Gross per 24 hour  Intake 375.96 ml  Output  200 ml  Net 175.96 ml   Filed Weights   08/12/18 0005  Weight: 40.3 kg   General: Alert, Awake and Oriented to Time, Place and Person. Appear in mild distress, affect appropriate Eyes: PERRL, Conjunctiva normal ENT: Oral Mucosa clear moist. Neck: no JVD, no Abnormal Mass Or lumps Cardiovascular: S1 and S2 Present, no Murmur, Peripheral Pulses Present Respiratory: normal respiratory effort, Bilateral Air entry equal and Decreased, no use of accessory muscle, Clear to Auscultation, no Crackles, no wheezes Abdomen: Bowel Sound present, Soft and no tenderness, no hernia Skin: no redness, no Rash, no induration Extremities: no Pedal edema, no calf tenderness Neurologic: Grossly no focal neuro deficit. Bilaterally Equal motor strength  Data Reviewed: CBC: Recent Labs  Lab 08/07/18 2113 08/11/18 1826  WBC 6.4 8.2  NEUTROABS 4.3  --   HGB 12.3 14.0  HCT 38.2 42.1  MCV 97.4 95.7  PLT 245 243   Basic Metabolic Panel: Recent Labs  Lab 08/07/18 2113 08/11/18 1826  NA 140 140  K 4.0 3.8  CL 106 104  CO2 23 22  GLUCOSE 183* 133*  BUN 13 13  CREATININE 0.73 0.59  CALCIUM 8.9 9.1    Liver Function Tests: Recent Labs  Lab 08/07/18 2113 08/11/18 1826  AST 28 28  ALT 16 16  ALKPHOS 59 63  BILITOT 0.6 1.5*  PROT 6.7 7.8  ALBUMIN 3.6 4.1   No results for input(s): LIPASE, AMYLASE in the last 168 hours. No results for input(s): AMMONIA in the last 168 hours. Coagulation Profile: No results for input(s): INR, PROTIME in the last 168 hours. Cardiac Enzymes: No results for input(s): CKTOTAL, CKMB, CKMBINDEX, TROPONINI in the last 168 hours. BNP (last 3 results) No results for input(s): PROBNP in the last 8760 hours. CBG: No results for input(s): GLUCAP in the last 168 hours. Studies: Dg Abd 1 View  Result Date: 08/11/2018 CLINICAL DATA:  Nausea. EXAM: ABDOMEN - 1 VIEW COMPARISON:  None. FINDINGS: The bowel gas pattern is normal. Status post cholecystectomy. No  radio-opaque calculi or other significant radiographic abnormality are seen. IMPRESSION: No evidence of bowel obstruction or ileus. Electronically Signed   By: Lupita RaiderJames  Green Jr, M.D.   On: 08/11/2018 21:15   Ct Head Wo Contrast  Result Date: 08/11/2018 CLINICAL DATA:  Nausea and dizziness, worse with movement. EXAM: CT HEAD WITHOUT CONTRAST TECHNIQUE: Contiguous axial images were obtained from the base of the skull through the vertex without intravenous contrast. COMPARISON:  08/07/2018 FINDINGS: Brain: Interval development of right nonhemorrhagic MCA territory infarct with loss of gray-white matter distinction involving the right temporal and parietal lobes. This is superimposed upon chronic moderate microvascular ischemic disease periventricular, deep and subcortical white matter. No hydrocephalus midline shift. Chronic remote right cerebellar infarct. Midline fourth ventricle and basal cisterns. No intra-axial mass nor extra-axial fluid. Vascular: Atherosclerosis of the carotid siphons. No hyperdense vessels noted. Skull: No acute skull fracture. No suspicious osseous lesions. Sinuses/Orbits: No acute finding. Other: None. IMPRESSION: 1. Late acute to early subacute right MCA territory infarct with loss of gray-white matter distinction involving the right temporal and parietal lobes. No hemorrhage noted. These results were called by telephone at the time of interpretation on 08/11/2018 at 7:31 pm to Dr. Margarita GrizzleANIELLE RAY ,  who verbally acknowledged these results. 2. Chronic moderate small vessel ischemic disease of periventricular, deep and subcortical white matter. 3. Chronic right cerebellar infarct. Electronically Signed   By: Tollie Eth M.D.   On: 08/11/2018 19:29   Mr Brain Wo Contrast  Result Date: 08/11/2018 CLINICAL DATA:  Initial evaluation for acute stroke. EXAM: MRI HEAD WITHOUT CONTRAST MRA HEAD WITHOUT CONTRAST TECHNIQUE: Multiplanar, multiecho pulse sequences of the brain and surrounding  structures were obtained without intravenous contrast. Angiographic images of the head were obtained using MRA technique without contrast. COMPARISON:  Prior CT from earlier the same day. FINDINGS: MRI HEAD FINDINGS Brain: Generalized age-related cerebral atrophy. Confluent T2/FLAIR hyperintensity within the periventricular deep white matter both cerebral hemispheres most consistent with chronic microvascular ischemic disease, advanced in nature. Encephalomalacia with gliosis within the right parietal region consistent with remote right posterior MCA territory infarct. Multiple additional scattered remote bilateral cerebellar infarcts, right greater than left. Confluent restricted diffusion seen involving the posterior right parietal temporal and occipital lobes, consistent with acute posterior right MCA territory infarct. This is positioned immediately adjacent to the chronic right MCA territory infarct. No associated hemorrhage or mass effect. No other areas of acute or subacute infarction. Gray-white matter differentiation otherwise maintained. Multiple chronic micro hemorrhages noted, predominantly localized to the cerebellum, most like related to poorly controlled hypertension. No mass lesion, midline shift or mass effect. No hydrocephalus. No extra-axial fluid collection. Pituitary gland within normal limits. Vascular: Major intracranial vascular flow voids maintained. Skull and upper cervical spine: Craniocervical junction within normal limits. Visualized upper cervical spine within normal limits. Bone marrow signal intensity normal. No scalp soft tissue abnormality. Sinuses/Orbits: Patient status post bilateral ocular lens replacement. Paranasal sinuses are clear. No mastoid effusion. Inner ear structures grossly normal. Other: None. MRA HEAD FINDINGS ANTERIOR CIRCULATION: Examination degraded by motion artifact. Distal cervical segments of the internal carotid arteries are patent with symmetric antegrade  flow. Petrous segments widely patent bilaterally. Cavernous/supraclinoid ICAs demonstrate mild multifocal atheromatous irregularity but are patent without hemodynamically significant stenosis. Right ICA terminus mildly ectatic. A1 segments widely patent bilaterally. Anterior communicating artery not well assessed. Anterior cerebral arteries patent to their distal aspects without proximal high-grade stenosis. M1 segments mildly irregular but are patent to their distal aspects without high-grade stenosis or occlusion. Normal MCA bifurcations. No proximal M2 occlusion. Distal right MCA branches attenuated as compared to the left, like related to chronic right MCA territory infarction. Distal small vessel atheromatous irregularity noted bilaterally. POSTERIOR CIRCULATION: Dominant left vertebral artery patent to the vertebrobasilar junction without stenosis. Right vertebral artery somewhat diminutive with multifocal atheromatous irregularity without high-grade stenosis. Left vertebral artery dominant. Left PICA patent proximally. Right PICA not well visualized. Basilar mildly tortuous with superimposed short-segment mild stenosis at its distal aspect (series 604, image 1). Superior cerebral arteries patent bilaterally. Left PCA supplied via the basilar. Right PCA supplied via the basilar as well as a prominent right posterior communicating artery. PCAs patent to their distal aspects without flow-limiting stenosis. Distal small vessel atheromatous irregularity. No intracranial aneurysm. IMPRESSION: MRI HEAD IMPRESSION: 1. Acute ischemic right posterior MCA territory infarct as above. No associated hemorrhage or mass effect. 2. Additional chronic right posterior MCA territory infarct, with multiple chronic bilateral cerebellar infarcts, right greater than left. 3. Multiple chronic micro hemorrhages seen scattered throughout the cerebellum, most consistent with chronic poorly controlled hypertension. 4. Underlying  age-related cerebral atrophy with advanced chronic microvascular ischemic disease. MRA HEAD IMPRESSION: 1. Negative intracranial MRA for large vessel occlusion.  2. Attenuation of the right MCA branches as compared to the left, likely related to the patient's chronic right MCA territory infarction. 3. Moderate small vessel atheromatous irregularity throughout the intracranial circulation. No proximal high-grade or correctable stenosis identified. Electronically Signed   By: Rise MuBenjamin  McClintock M.D.   On: 08/11/2018 22:33   Dg Chest Port 1 View  Result Date: 08/11/2018 CLINICAL DATA:  Altered mental status. Weakness. Nausea and dizziness. EXAM: PORTABLE CHEST 1 VIEW COMPARISON:  08/07/2018 FINDINGS: The cardiomediastinal silhouette is unchanged with borderline cardiomegaly and extensive mitral annular calcification noted. There is aortic atherosclerosis. Lung volumes are low with mild elevation of the right hemidiaphragm. Medial left retrocardiac density corresponds to a known hiatal hernia. No definite airspace consolidation, sizeable pleural effusion, or pneumothorax is identified. Surgical clips are noted in the lower neck, left axilla, and right upper abdomen. No acute osseous abnormality is seen. IMPRESSION: Low lung volumes without evidence of acute airspace disease. Electronically Signed   By: Sebastian AcheAllen  Grady M.D.   On: 08/11/2018 19:11   Mr Maxine GlennMra Head Wo Contrast  Result Date: 08/11/2018 CLINICAL DATA:  Initial evaluation for acute stroke. EXAM: MRI HEAD WITHOUT CONTRAST MRA HEAD WITHOUT CONTRAST TECHNIQUE: Multiplanar, multiecho pulse sequences of the brain and surrounding structures were obtained without intravenous contrast. Angiographic images of the head were obtained using MRA technique without contrast. COMPARISON:  Prior CT from earlier the same day. FINDINGS: MRI HEAD FINDINGS Brain: Generalized age-related cerebral atrophy. Confluent T2/FLAIR hyperintensity within the periventricular deep white  matter both cerebral hemispheres most consistent with chronic microvascular ischemic disease, advanced in nature. Encephalomalacia with gliosis within the right parietal region consistent with remote right posterior MCA territory infarct. Multiple additional scattered remote bilateral cerebellar infarcts, right greater than left. Confluent restricted diffusion seen involving the posterior right parietal temporal and occipital lobes, consistent with acute posterior right MCA territory infarct. This is positioned immediately adjacent to the chronic right MCA territory infarct. No associated hemorrhage or mass effect. No other areas of acute or subacute infarction. Gray-white matter differentiation otherwise maintained. Multiple chronic micro hemorrhages noted, predominantly localized to the cerebellum, most like related to poorly controlled hypertension. No mass lesion, midline shift or mass effect. No hydrocephalus. No extra-axial fluid collection. Pituitary gland within normal limits. Vascular: Major intracranial vascular flow voids maintained. Skull and upper cervical spine: Craniocervical junction within normal limits. Visualized upper cervical spine within normal limits. Bone marrow signal intensity normal. No scalp soft tissue abnormality. Sinuses/Orbits: Patient status post bilateral ocular lens replacement. Paranasal sinuses are clear. No mastoid effusion. Inner ear structures grossly normal. Other: None. MRA HEAD FINDINGS ANTERIOR CIRCULATION: Examination degraded by motion artifact. Distal cervical segments of the internal carotid arteries are patent with symmetric antegrade flow. Petrous segments widely patent bilaterally. Cavernous/supraclinoid ICAs demonstrate mild multifocal atheromatous irregularity but are patent without hemodynamically significant stenosis. Right ICA terminus mildly ectatic. A1 segments widely patent bilaterally. Anterior communicating artery not well assessed. Anterior cerebral  arteries patent to their distal aspects without proximal high-grade stenosis. M1 segments mildly irregular but are patent to their distal aspects without high-grade stenosis or occlusion. Normal MCA bifurcations. No proximal M2 occlusion. Distal right MCA branches attenuated as compared to the left, like related to chronic right MCA territory infarction. Distal small vessel atheromatous irregularity noted bilaterally. POSTERIOR CIRCULATION: Dominant left vertebral artery patent to the vertebrobasilar junction without stenosis. Right vertebral artery somewhat diminutive with multifocal atheromatous irregularity without high-grade stenosis. Left vertebral artery dominant. Left PICA patent proximally. Right PICA  not well visualized. Basilar mildly tortuous with superimposed short-segment mild stenosis at its distal aspect (series 604, image 1). Superior cerebral arteries patent bilaterally. Left PCA supplied via the basilar. Right PCA supplied via the basilar as well as a prominent right posterior communicating artery. PCAs patent to their distal aspects without flow-limiting stenosis. Distal small vessel atheromatous irregularity. No intracranial aneurysm. IMPRESSION: MRI HEAD IMPRESSION: 1. Acute ischemic right posterior MCA territory infarct as above. No associated hemorrhage or mass effect. 2. Additional chronic right posterior MCA territory infarct, with multiple chronic bilateral cerebellar infarcts, right greater than left. 3. Multiple chronic micro hemorrhages seen scattered throughout the cerebellum, most consistent with chronic poorly controlled hypertension. 4. Underlying age-related cerebral atrophy with advanced chronic microvascular ischemic disease. MRA HEAD IMPRESSION: 1. Negative intracranial MRA for large vessel occlusion. 2. Attenuation of the right MCA branches as compared to the left, likely related to the patient's chronic right MCA territory infarction. 3. Moderate small vessel atheromatous  irregularity throughout the intracranial circulation. No proximal high-grade or correctable stenosis identified. Electronically Signed   By: Rise Mu M.D.   On: 08/11/2018 22:33     Time spent: The patient is critically ill with multiple organ systems failure and requires high complexity decision making for assessment and support, frequent evaluation and titration of therapies. Critical Care Time devoted to patient care services described in this note is 35 minutes   Author: Lynden Oxford, MD [facility] Pager: [phone] 08/12/2018 8:13 AM  Between 7PM-7AM, please contact night-coverage at [email or web address], password [facility]

## 2018-08-12 NOTE — Progress Notes (Addendum)
STROKE TEAM PROGRESS NOTE   SUBJECTIVE (INTERVAL HISTORY) Her daughter and son are at the bedside.  Pt lying in bed, not in distress, hard of hearing, but able to following most commands and answer questions appropriately. She still mild UE drift and left lower quadrantanopia. As per daughter, she was able to walk with walker but was a fall risk at home.    OBJECTIVE Vitals:   08/13/18 0459 08/13/18 0750 08/13/18 1108 08/13/18 1109  BP: (!) 166/84   113/73  Pulse: 74   63  Resp:    18  Temp: (!) 97.5 F (36.4 C) 97.7 F (36.5 C) 98 F (36.7 C)   TempSrc: Axillary Axillary Oral   SpO2: 93%   95%  Weight:      Height:        CBC:  Recent Labs  Lab 08/12/18 1435 08/13/18 0537  WBC 9.6 9.9  NEUTROABS 7.7 7.5  HGB 13.0 14.1  HCT 39.8 42.1  MCV 92.3 91.5  PLT 248 257    Basic Metabolic Panel:  Recent Labs  Lab 08/12/18 1435 08/13/18 0202  NA 140 137  K 3.4* 3.8  CL 106 105  CO2 19* 24  GLUCOSE 111* 103*  BUN 6* 6*  CREATININE 0.61 0.41*  CALCIUM 9.1 8.9  MG 1.6* 2.0    Lipid Panel:     Component Value Date/Time   CHOL 129 05/03/2017 0624   TRIG 59 05/03/2017 0624   HDL 40 (L) 05/03/2017 0624   CHOLHDL 3.2 05/03/2017 0624   VLDL 12 05/03/2017 0624   LDLCALC 77 05/03/2017 0624   HgbA1c:  Lab Results  Component Value Date   HGBA1C 4.8 08/13/2018   Urine Drug Screen:     Component Value Date/Time   LABOPIA NONE DETECTED 05/02/2017 2042   COCAINSCRNUR NONE DETECTED 05/02/2017 2042   LABBENZ POSITIVE (A) 05/02/2017 2042   AMPHETMU NONE DETECTED 05/02/2017 2042   THCU NONE DETECTED 05/02/2017 2042   LABBARB NONE DETECTED 05/02/2017 2042    Alcohol Level     Component Value Date/Time   ETH <5 03/23/2015 0019    IMAGING  Ct Head Wo Contrast 08/11/2018 IMPRESSION:  1. Late acute to early subacute right MCA territory infarct with loss of gray-white matter distinction involving the right temporal and parietal lobes. No hemorrhage noted.  2.  Chronic moderate small vessel ischemic disease of periventricular, deep and subcortical white matter.  3. Chronic right cerebellar infarct.    Mr Maxine Glenn Head Wo Contrast 08/11/2018 IMPRESSION:   MRI HEAD IMPRESSION:  1. Acute ischemic right posterior MCA territory infarct as above. No associated hemorrhage or mass effect.  2. Additional chronic right posterior MCA territory infarct, with multiple chronic bilateral cerebellar infarcts, right greater than left.  3. Multiple chronic micro hemorrhages seen scattered throughout the cerebellum, most consistent with chronic poorly controlled hypertension.  4. Underlying age-related cerebral atrophy with advanced chronic microvascular ischemic disease.   MRA HEAD IMPRESSION:  1. Negative intracranial MRA for large vessel occlusion.  2. Attenuation of the right MCA branches as compared to the left, likely related to the patient's chronic right MCA territory infarction.  3. Moderate small vessel atheromatous irregularity throughout the intracranial circulation. No proximal high-grade or correctable stenosis identified.    Vas US Carotid  08/12/2018 Summary: Right Carotid: The extracranial vessels were near-normal with only minimal wall thickening or plaque.  Left Carotid: Velocities in the left ICA are consistent with a 1-39% stenosis.  Vertebrals:  Bilateral vertebral  arteries demonstrate antegrade flow.  Subclavians: Normal flow hemodynamics were seen in bilateral subclavian arteries.     Transthoracic Echocardiogram  08/12/2018 Study Conclusions - Left ventricle: The cavity size was normal. Wall thickness was   normal. Systolic function was normal. The estimated ejection   fraction was in the range of 55% to 60%. Wall motion was normal;   there were no regional wall motion abnormalities. Features are   consistent with a pseudonormal left ventricular filling pattern,   with concomitant abnormal relaxation and increased filling   pressure  (grade 2 diastolic dysfunction). - Mitral valve: Calcified annulus. There was mild regurgitation. - Left atrium: The atrium was severely dilated.    PHYSICAL EXAM  Temp:  [97.5 F (36.4 C)-98.2 F (36.8 C)] 98.2 F (36.8 C) (12/29 1635) Pulse Rate:  [63-74] 63 (12/29 1109) Resp:  [18] 18 (12/29 1109) BP: (113-166)/(73-84) 113/73 (12/29 1109) SpO2:  [93 %-95 %] 95 % (12/29 1109)  General - thin built, well developed, in no apparent distress.  Ophthalmologic - fundi not visualized due to noncooperation.  Cardiovascular - irregularly irregular heart rate and rhythm.  Mental Status -  Level of arousal and orientation to person were intact, but not orientated to place and time. Language including expression, naming, repetition, comprehension was assessed and found intact.  Cranial Nerves II - XII - II - Visual field showed left lower quadrantanopia. III, IV, VI - Extraocular movements intact. V - Facial sensation intact bilaterally. VII - Facial movement intact bilaterally. VIII - Hearing & vestibular intact bilaterally. X - Palate elevates symmetrically. XI - Chin turning & shoulder shrug intact bilaterally. XII - Tongue protrusion intact.  Motor Strength - The patient's strength was symmetrial in all extremities except pronator drift was present on the left. Bulk was normal and fasciculations were absent.   Motor Tone - Muscle tone was assessed at the neck and appendages and was normal.  Reflexes - The patient's reflexes were symmetrical in all extremities and she had no pathological reflexes.  Sensory - Light touch, temperature/pinprick were assessed and were symmetrical.    Coordination - not cooperative.  Tremor was absent.  Gait and Station - deferred.    ASSESSMENT/PLAN Ms. Leah Figueroa is a 84 y.o. female with history of HTN, HLD, prior right MCA CVA, CAD with acute MI, hypothyroidism, breast CA s/p lumpectomy and dementia presenting with Lt sided weakness,  dizziness, nausea and vomiting. She did not receive IV t-PA due to minimal deficits.  Stroke:  right posterior MCA territory infarct - embolic - likely due to new diagnosed afib  Resultant  Left UE drift  CT head - Chronic moderate small vessel ischemic disease.  MRI head - Acute ischemic right posterior MCA territory infarct. Multiple old infarcts. And extensive b/l cerebellar CMBs  MRA head - Negative intracranial MRA for large vessel occlusion  Carotid Doppler - unremarkable  2D Echo - EF 55 - 60%. No cardiac source of emboli identified.   LDL - pending  HgbA1c - 4.8  VTE prophylaxis - Lovenox  Diet - regular  clopidogrel 75 mg daily prior to admission, now on aspirin 325 mg daily. Continue ASA on discharge. Pt was deemed by Dr. Pearlean BrownieSethi in the past not a good [facility], however, given current stroke, family would like to re-consider it and will discuss with Dr. Pearlean BrownieSethi at next clinic appointment.  Ongoing aggressive stroke risk factor management  Therapy recommendations:  CIR recommended  Disposition:  Pending  Afib, new  diagnosis  EKG showed afib with RVR  Currently rate controlled  Pt able to walk with walker at home, she is fall risk  Pt was deemed not good AC candidate by Dr. Pearlean BrownieSethi in the past  given current stroke, family would like to re-consider it and will discuss with Dr. Pearlean BrownieSethi at next clinic appointment.  History of stroke  03/2015, left-sided weakness status post TPA, MRI showed right MCA and right pontine infarct, embolic apparent.  MRI negative, carotid Doppler and TTE unremarkable.  LDL 94 and A1c 5.8.  Her aspirin changed to Plavix and discharged with pravastatin.  [facility], [facility] due to fall risk and advanced age  73/2018 admitted for right-sided weakness slurred speech.  MRI negative.  MRA no LVO.  Carotid Doppler negative.  EF 55 to 60%.  LDL 77 and A1c 5.0.  Patient symptoms was contributed to  UTI, she was discharged with Plavix and Lipitor 10.  Hypertension  Stable . Long-term BP goal normotensive  Hyperlipidemia  Lipid lowering medication PTA: Lipitor 10 mg daily  LDL pending, goal < 70  Current lipid lowering medication: Lipitor 10 mg daily  Continue statin at discharge  Other Stroke Risk Factors  Advanced age   Hx stroke/TIA  Coronary artery disease/MI  Other Active Problems  Dementia  Baseline at home able to walk with walker, but also most time on wheelchair  Breast cancer status post surgery  Hospital day # 2  Neurology will sign off. Please call with questions. Pt [facility] stroke [facility]. Thanks for the consult.  Marvel PlanJindong Kacper Cartlidge, MD PhD Stroke Neurology 08/13/2018 5:07 PM  I spent  35 minutes in total face-to-face time with the patient, more than 50% of which was spent in counseling and coordination of care, reviewing test results, images and medication, and discussing the diagnosis of new onset A. fib, A. fib RVR, right MCA stroke, treatment plan and potential prognosis. This patient's care requiresreview of multiple databases, neurological assessment, discussion with family, other specialists and medical decision making of high complexity. I had long discussion with daughter and son-in-law at bedside, updated pt current condition, treatment plan and potential prognosis. They expressed understanding and appreciation.   To contact Stroke Continuity provider, please refer to [email or web address]. After hours, contact General Neurology

## 2018-08-12 NOTE — Evaluation (Signed)
Occupational Therapy Evaluation Patient Details Name: Leah Figueroa MRN: [id number] DOB: 27-Jun-1923 Today's Date: 08/12/2018    History of Present Illness Pt is a 84 y/o female admitted secondary to dizziness and L sided weakness. MRI revealed  large right temporal and parietal lobe ischemic infarction. PMH including but not limited to HTN, CAD, prior stroke with L sided weakness and dementia.    Clinical Impression   PTA Pt min A for dressing (fine motor - buttons/hooks) and bathing (for safety), mod I with RW for mobility - Pt was able to toilet independently. Presents with deficits below (see OT problem list). Currently she is demonstrating L inattention and trouble identifying functional use of ADL tools. Pt has excellent home support and will benefit from skilled OT in the acute setting and afterwards at the CIR level to maximize safety and independence in ADL and functional transfers to return to PLOF. Next session to work on transfers, look at vision more closely, and L inattention for functional tasks.     Follow Up Recommendations  CIR;Supervision/Assistance - 24 hour    Equipment Recommendations  Other (comment)(defer to next venue)    Recommendations for Other Services       Precautions / Restrictions Precautions Precautions: Fall Precaution Comments: L sided inattention Restrictions Weight Bearing Restrictions: No      Mobility Bed Mobility Overal bed mobility: Needs Assistance Bed Mobility: Supine to Sit;Sit to Supine     Supine to sit: Min guard Sit to supine: Min assist   General bed mobility comments: min guard for safety, min A for BLE back into bed  Transfers                      Balance Overall balance assessment: Needs assistance Sitting-balance support: Feet supported Sitting balance-[name] Scale: Fair                                     ADL either performed or assessed with clinical judgement   ADL Overall ADL's : Needs  assistance/impaired Eating/Feeding: Set up   Grooming: Brushing hair;Wash/dry face;Sitting;Moderate assistance Grooming Details (indicate cue type and reason): items placed on the left side of Pt, cues to find. Pt was able to pick up and then required cues to perform grooming activity (did not automatically brush hair etc) Upper Body Bathing: Moderate assistance;Sitting   Lower Body Bathing: Moderate assistance;Sitting/lateral leans   Upper Body Dressing : Minimal assistance;Sitting Upper Body Dressing Details (indicate cue type and reason): struggled with buttons and hooks PTA Lower Body Dressing: Moderate assistance;Sitting/lateral leans Lower Body Dressing Details (indicate cue type and reason): unable to don/doff socks independently at this time Toilet Transfer: Min guard;Minimal assistance   Toileting- ArchitectClothing Manipulation and Hygiene: Min guard;Minimal assistance       Functional mobility during ADLs: (deferred) General ADL Comments: requires cues for use of grooming tools, left inattention, no family present to determine baseline     Vision Baseline Vision/History: Wears glasses Wears Glasses: At all times Vision Assessment?: Vision impaired- to be further tested in functional context     Perception Perception Perception Tested?: Yes Perception Deficits: Inattention/neglect;Object recognition Inattention/Neglect: Does not attend to left visual field;Impaired- to be further tested in functional context Spatial deficits: unable to identify function of grooming tasks - required cues   Praxis      Pertinent Vitals/Pain Pain Assessment: Faces Faces Pain Scale: No hurt Pain  Intervention(s): Monitored during session     Hand Dominance Right   Extremity/Trunk Assessment Upper Extremity Assessment Upper Extremity Assessment: LUE deficits/detail;Generalized weakness LUE Deficits / Details: grossly 4/5 strength, reports sensation intact - but slow responses to  questions LUE Sensation: decreased proprioception LUE Coordination: decreased fine motor;decreased gross motor(unsure of baseline as no family present)   Lower Extremity Assessment Lower Extremity Assessment: Defer to PT evaluation   Cervical / Trunk Assessment Cervical / Trunk Assessment: Kyphotic   Communication Communication Communication: No difficulties   Cognition Arousal/Alertness: Awake/alert Behavior During Therapy: Impulsive Overall Cognitive Status: Impaired/Different from baseline Area of Impairment: Memory;Following commands;Safety/judgement;Problem solving                     Memory: Decreased short-term memory;Decreased recall of precautions Following Commands: Follows one step commands with increased time;Follows one step commands consistently Safety/Judgement: Decreased awareness of deficits;Decreased awareness of safety   Problem Solving: Difficulty sequencing;Requires verbal cues;Requires tactile cues General Comments: Pt very pleasant but did not always respond to questions appropriately, did not recall why she was in the hospital but could tell me she was in the hospital. She did perseverate on topics    General Comments       Exercises     Shoulder Instructions      Home Living Family/patient expects to be discharged to:: Private residence Living Arrangements: Children(daughter) Available Help at Discharge: Family;Personal care attendant;Available 24 hours/day Type of Home: House Home Access: Stairs to enter Enter[employer] CorporationEntrance Stairs-Number of Steps: 1   Home Layout: One level     Bathroom Shower/Tub: Tub/shower unit;Walk-in shower   Bathroom Toilet: Standard Bathroom Accessibility: Yes How Accessible: Accessible via walker Home Equipment: Walker - 2 wheels;Shower seat;Hand held shower head      Lives With: Daughter    Prior Functioning/Environment Level of Independence: Needs assistance  Gait / Transfers Assistance Needed: pt ambulated with  use of RW ADL's / Homemaking Assistance Needed: required some assistance from daughter for bathing and dressing (needed help with buttons/zippers)            OT Problem List: Decreased strength;Decreased activity tolerance;Impaired balance (sitting and/or standing);Impaired vision/perception;Decreased cognition;Decreased safety awareness;Cardiopulmonary status limiting activity      OT Treatment/Interventions: Behring-care/ADL training;Neuromuscular education;Therapeutic exercise;Modalities;Therapeutic activities;Visual/perceptual remediation/compensation;Patient/family education;Balance training    OT Goals(Current goals can be found in the care plan section) Acute Rehab OT Goals Patient Stated Goal: to return to PLOF OT Goal Formulation: With patient Time For Goal Achievement: 08/26/18 Potential to Achieve Goals: Good ADL Goals Pt Will Perform Grooming: with modified independence;sitting Pt Will Perform Upper Body Dressing: with min assist;with caregiver independent in assisting;sitting Pt Will Perform Lower Body Dressing: with supervision;sit to/from stand Pt Will Transfer to Toilet: with modified independence;ambulating Pt Will Perform Toileting - Clothing Manipulation and hygiene: with modified independence;sit to/from stand Additional ADL Goal #1: Pt will find objects on the left side of the sink during grooming task with 2 or less verbal cues  OT Frequency: Min 3X/week   Barriers to D/C:            Co-evaluation              AM-PAC OT "6 Clicks" Daily Activity     Outcome Measure Help from another person eating meals?: A Little Help from another person taking care of personal grooming?: A Little Help from another person toileting, which includes using toliet, bedpan, or urinal?: A Little Help from another person bathing (including washing, rinsing, drying)?:  A Little Help from another person to put on and taking off regular upper body clothing?: A Little Help from  another person to put on and taking off regular lower body clothing?: A Little 6 Click Score: 18   End of Session Nurse Communication: Mobility status  Activity Tolerance: Patient tolerated treatment well Patient left: in bed;with call bell/phone within reach;with bed alarm set  OT Visit Diagnosis: Unsteadiness on feet (R26.81);Other abnormalities of gait and mobility (R26.89);Muscle weakness (generalized) (M62.81);Other symptoms and signs involving the nervous system (R29.898);Other symptoms and signs involving cognitive function                Time: 1451-1516 OT Time Calculation (min): 25 min Charges:  OT General Charges $OT Visit: 1 Visit OT Evaluation $OT Eval Moderate Complexity: 1 Mod OT Treatments $Polan Care/Home Management : 8-22 mins  Sherryl Manges OTR/L Acute Rehabilitation Services Pager: [phone] Office: [phone]  Evern Bio Adriaan Maltese 08/12/2018, 5:37 PM

## 2018-08-12 NOTE — Progress Notes (Signed)
VASCULAR LAB PRELIMINARY  PRELIMINARY  PRELIMINARY  PRELIMINARY  Carotid duplex completed.    Preliminary report:  1-39% ICA plaquing. Vertebral artery flow is antegrade.  Leah Figueroa, RVT 08/12/2018, 10:21 AM

## 2018-08-12 NOTE — Evaluation (Signed)
Physical Therapy Evaluation Patient Details Name: Leah FillerMabel E Figueroa MRN: [id number] DOB: Apr 28, 1923 Today's Date: 08/12/2018   History of Present Illness  Pt is a 84 y/o female admitted secondary to dizziness and L sided weakness. MRI revealed  large right temporal and parietal lobe ischemic infarction. PMH including but not limited to HTN, CAD, prior stroke with L sided weakness and dementia.     Clinical Impression  Pt presented supine in bed with HOB elevated, awake and willing to participate in therapy session. Pt's daughter present throughout session and provided history information. Prior to admission, pt ambulated with use of RW and required minimal assistance with ADLs from daughter. Pt lives with her daughter in a single level home with one step to enter. Her family provides 24/7 supervision/assistance for pt. Pt currently able to perform bed mobility with min guard and transfers with min guard to min A for stability and safety. Pt limited to transfers only this session secondary to tachycardia (HR elevating to as high as 160 bpm with minimal activity). Pt's RN in room throughout evaluation and aware. Pt comfortably seated in recliner chair at end of session with HR returning to low 100's. Pt is an excellent candidate for further intensive therapy services to maximize her independence with functional mobility prior to returning home with family support. PT will continue to follow acutely to progress mobility as tolerated.     Follow Up Recommendations CIR    Equipment Recommendations  None recommended by PT    Recommendations for Other Services Rehab consult     Precautions / Restrictions Precautions Precautions: Fall Precaution Comments: L sided inattention Restrictions Weight Bearing Restrictions: No      Mobility  Bed Mobility Overal bed mobility: Needs Assistance Bed Mobility: Supine to Sit     Supine to sit: Min guard     General bed mobility comments: min guard for  safety  Transfers Overall transfer level: Needs assistance Equipment used: 1 person hand held assist Transfers: Sit to/from [employer] CorporationStand;Stand Pivot Transfers Sit to Stand: Min guard Stand pivot transfers: Min assist       General transfer comment: min guard for safety with transition into standing from EOB; min A for stability with pivot to recliner chair towards pt's R side. Limited as pt's HR increasing to as high as 160 bpm - RN in room and aware  Ambulation/Gait             General Gait Details: deferred secondary to tachycardia (HR increasing to as high as 160 bpm with sitting EOB and transfers)  Stairs            Wheelchair Mobility    Modified Rankin (Stroke Patients Only) Modified Rankin (Stroke Patients Only) Pre-Morbid Rankin Score: Moderately severe disability Modified Rankin: Moderately severe disability     Balance Overall balance assessment: Needs assistance Sitting-balance support: Feet supported Sitting balance-Leah Figueroa: Fair     Standing balance support: Single extremity supported;Bilateral upper extremity supported Standing balance-Leah Figueroa: Poor                               Pertinent Vitals/Pain Pain Assessment: No/denies pain    Home Living Family/patient expects to be discharged to:: Private residence Living Arrangements: Children(daughter) Available Help at Discharge: Family;Personal care attendant;Available 24 hours/day Type of Home: House Home Access: Stairs to enter   Entergy CorporationEntrance Stairs-Number of Steps: 1 Home Layout: One level Home Equipment: Walker - 2 wheels  Prior Function Level of Independence: Needs assistance   Gait / Transfers Assistance Needed: pt ambulated with use of RW  ADL's / Homemaking Assistance Needed: required some assistance from daughter for bathing and dressing (needed help with buttons/zippers)        Hand Dominance        Extremity/Trunk Assessment   Upper Extremity  Assessment Upper Extremity Assessment: Defer to OT evaluation;Generalized weakness;Difficult to assess due to impaired cognition    Lower Extremity Assessment Lower Extremity Assessment: Generalized weakness;Difficult to assess due to impaired cognition    Cervical / Trunk Assessment Cervical / Trunk Assessment: Kyphotic  Communication   Communication: No difficulties  Cognition Arousal/Alertness: Awake/alert Behavior During Therapy: Restless;Impulsive Overall Cognitive Status: Impaired/Different from baseline Area of Impairment: Memory;Following commands;Safety/judgement;Problem solving                     Memory: Decreased short-term memory;Decreased recall of precautions Following Commands: Follows one step commands with increased time;Follows one step commands consistently Safety/Judgement: Decreased awareness of deficits;Decreased awareness of safety   Problem Solving: Difficulty sequencing;Requires verbal cues;Requires tactile cues        General Comments      Exercises     Assessment/Plan    PT Assessment Patient needs continued PT services  PT Problem List Decreased strength;Decreased activity tolerance;Decreased mobility;Decreased balance;Decreased coordination;Decreased cognition;Decreased knowledge of use of DME;Decreased safety awareness;Decreased knowledge of precautions;Cardiopulmonary status limiting activity       PT Treatment Interventions DME instruction;Gait training;Stair training;Functional mobility training;Therapeutic activities;Therapeutic exercise;Balance training;Neuromuscular re-education;Cognitive remediation;Patient/family education    PT Goals (Current goals can be found in the Care Plan section)  Acute Rehab PT Goals Patient Stated Goal: to return to PLOF PT Goal Formulation: With patient/family Time For Goal Achievement: 08/26/18 Potential to Achieve Goals: Good    Frequency Min 4X/week   Barriers to discharge         Co-evaluation               AM-PAC PT "6 Clicks" Mobility  Outcome Measure Help needed turning from your back to your side while in a flat bed without using bedrails?: None Help needed moving from lying on your back to sitting on the side of a flat bed without using bedrails?: None Help needed moving to and from a bed to a chair (including a wheelchair)?: A Little Help needed standing up from a chair using your arms (e.g., wheelchair or bedside chair)?: A Little Help needed to walk in hospital room?: A Little Help needed climbing 3-5 steps with a railing? : A Lot 6 Click Score: 19    End of Session Equipment Utilized During Treatment: Gait belt Activity Tolerance: Treatment limited secondary to medical complications (Comment);Other (comment)(limited secondary to tachycardia) Patient left: in chair;with call bell/phone within reach;with chair alarm set;with family/visitor present;Other (comment)(RN and SLP in room) Nurse Communication: Mobility status PT Visit Diagnosis: Other abnormalities of gait and mobility (R26.89)    Time: 9147-82951201-1221 PT Time Calculation (min) (ACUTE ONLY): 20 min   Charges:   PT Evaluation $PT Eval Moderate Complexity: 1 Mod          Leah Figueroa, PT, DPT  Acute Rehabilitation Services Pager [phone] Office [phone]    Leah BevelsJennifer M Aalaysia Figueroa 08/12/2018, 12:51 PM

## 2018-08-12 NOTE — Progress Notes (Signed)
  Echocardiogram 2D Echocardiogram has been performed.  Chiyo Fay L Androw 08/12/2018, 10:08 AM

## 2018-08-12 NOTE — Progress Notes (Signed)
Rehab Admissions Coordinator Note:  Patient was screened by Clois DupesBoyette, Castella Lerner Godwin for appropriateness for an Inpatient Acute Rehab Consult per PT and SLP recs..  At this time, we are recommending Inpatient Rehab consult. Please place an order.  Clois DupesBoyette, Esmirna Ravan Godwin 08/12/2018, 1:59 PM  I can be reached at [phone].

## 2018-08-12 NOTE — Evaluation (Signed)
Speech Language Pathology Evaluation Patient Details Name: Leah Figueroa MRN: [id number] DOB: 25-Aug-1922 Today's Date: 08/12/2018 Time: 4098-11911202-1234 SLP Time Calculation (min) (ACUTE ONLY): 32 min  Problem List:  Patient Active Problem List   Diagnosis Date Noted  . Acute right MCA stroke (HCC) 08/11/2018  . Abdominal pain 08/11/2018  . Hematochezia 05/15/2017  . Coronary artery disease 05/15/2017  . Other and unspecified hyperlipidemia 05/15/2017  . Hypothyroidism 05/15/2017  . HTN (hypertension) 05/15/2017  . TIA (transient ischemic attack) 05/02/2017  . History of stroke 03/07/2017  . Gait abnormality 03/07/2017  . Subacromial impingement of left shoulder 02/09/2016  . Hemiparesis affecting left side as late effect of stroke (HCC) 01/20/2016  . Vertigo 09/03/2015  . Dehydration 09/03/2015  . Acute lower UTI 09/03/2015  . Prolonged Q-T interval on ECG 09/03/2015  . Left-sided neglect 07/01/2015  . History of right MCA stroke 07/01/2015  . Altered sensation due to stroke 04/29/2015  . Cognitive deficit, post-stroke 04/29/2015  . Pseudogout of left wrist 03/28/2015  . Hyperlipidemia LDL goal <70 03/26/2015  . Hyperglycemia 03/26/2015  . 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 Embolic stroke (HCC)   . Left hemiplegia (HCC)   . Homonymous hemianopia   . Chest wall trauma 01/11/2014  . Injury of left lower arm 01/11/2014  . Headache(784.0) 01/04/2013  . Allergic rhinitis, cause unspecified 01/04/2013  . Anemia 04/19/2012  . Epistaxis, recurrent 04/19/2012  . Hypothyroidism 01/01/2008  . Essential hypertension 01/01/2008  . HYPERTENSION 01/01/2008  . MYOCARDIAL INFARCTION, ACUTE, ANTERIOR WALL 01/01/2008  . LEG CRAMPS 01/01/2008  . ADENOCARCINOMA, BREAST, HX OF 01/01/2008  . DIVERTICULOSIS, COLON, HX OF 01/01/2008  . ARTHRITIS, HX OF 01/01/2008  . RADIATION THERAPY, HX OF 01/01/2008  . CATARACT EXTRACTION, HX OF 01/01/2008  .  CHOLECYSTECTOMY, LAPAROSCOPIC, HX OF 01/01/2008   Past Medical History:  Past Medical History:  Diagnosis Date  . Acute myocardial infarction of other anterior wall, episode of care unspecified   . Arthritis   . Coronary artery disease   . Other and unspecified hyperlipidemia   . Personal history of arthritis   . Personal history of malignant neoplasm of breast    s/p lumpectomy  . Stroke [facility](HCC) 03/2015   "left sided issues"  . Unspecified essential hypertension   . Unspecified hypothyroidism    Past Surgical History:  Past Surgical History:  Procedure Laterality Date  . BACK SURGERY    . CATARACT EXTRACTION     left-2006, right 2011  . CHOLECYSTECTOMY    . intrathoracic goiter resection    . lumpectomy for breast cancer Left 80's   radiation therapy  . right thyroid lobectomy     Hx Goiter   HPI:  84 y.o. female with medical history significant for hypertension, CAD, Hx of right MCA stroke, hypothyroidism, and dementia who presents the ED with left-sided weakness, confusion, and dizziness.  [facility] 08[facility]07/2018 for left arm weakness MRI head 08/11/18 indicated Acute ischemic right posterior MCA territory infarct as above. No associated hemorrhage or mass effect. 2. Additional chronic right posterior MCA territory infarct, with multiple chronic bilateral cerebellar infarcts, right greater than left.  Assessment / Plan / Recommendation Clinical Impression   Pt presents with expressive aphasia characterized by perseveration, phonemic paraphasias, language of confusion and decreased awareness of errors.  Cognitive deficits noted with attention, memory (retrieval, decreased new info, and short-term memory), reasoning and problem solving with basic tasks.  Safety  awareness a concern as pt attempting to get out of chair without verbal cues provided during SLE; pt required mod verbal/tactile cues during BSE to maintain attention to task of  eating/drinking and maintaining topic with conversation; Language of confusion noted during simple conversation and pt oriented to [place] only.  Pt is HOH and daughter stated her vision has changed as well.  Graphic expression and reading not evaluated during this assessment; Recommend ST f/u for cognitive/speech/language deficits while in acute setting in conjunction with dysphagia goals for diet tolerance.  Thank you for this consult.    SLP Assessment  SLP Recommendation/Assessment: Patient needs continued Speech Language Pathology Services SLP Visit Diagnosis: Cognitive communication deficit (R41.841);Aphasia (R47.01)    Follow Up Recommendations  Inpatient Rehab    Frequency and Duration min 2x/week  1 week      SLP Evaluation Cognition  Overall Cognitive Status: Impaired/Different from baseline Arousal/Alertness: Awake/alert Orientation Level: Disoriented to place;Disoriented to time;Disoriented to situation;Oriented to person Attention: Sustained Sustained Attention: Impaired Sustained Attention Impairment: Verbal basic;Functional basic Memory: Impaired Memory Impairment: Retrieval deficit;Decreased recall of new information;Decreased short term memory Decreased Short Term Memory: Verbal basic;Functional basic Awareness: Impaired Awareness Impairment: Intellectual impairment Problem Solving: Impaired Problem Solving Impairment: Verbal basic;Functional basic Executive Function: Reasoning;Decision Making Reasoning: Impaired Reasoning Impairment: Verbal basic;Functional basic Decision Making: Impaired Decision Making Impairment: Verbal basic;Functional basic Behaviors: Restless;Perseveration Safety/Judgment: Impaired Comments: (Pt attempting to get out of chair without verbal reminders)       Comprehension  Auditory Comprehension Overall Auditory Comprehension: Impaired Yes/No Questions: Within Functional Limits Commands: Impaired Multistep Basic Commands: 25-49%  accurate Conversation: Simple Other Conversation Comments: (aphasia/language of confusion noted) Interfering Components: Attention;Visual impairments;Hearing;Working Radio broadcast assistantmemory EffectiveTechniques: Extra processing time;Repetition;Visual/Gestural cues;Increased volume Visual Recognition/Discrimination Discrimination: Not tested Reading Comprehension Reading Status: Unable to assess (comment)(visual deficits)    Expression Expression Primary Mode of Expression: Verbal Verbal Expression Overall Verbal Expression: Impaired Level of Generative/Spontaneous Verbalization: Sentence Repetition: Impaired Level of Impairment: Phrase level Naming: Impairment Responsive: 26-50% accurate Confrontation: Impaired Convergent: 0-24% accurate Divergent: 0-24% accurate Other Naming Comments: (perseverations, phonemic paraphasias, language of confusion) Verbal Errors: Phonemic paraphasias;Confabulation;Perseveration;Language of confusion;Not aware of errors Pragmatics: Unable to assess Interfering Components: Attention Non-Verbal Means of Communication: Not applicable Written Expression Dominant Hand: Right Written Expression: Not tested   Oral / Motor  Oral Motor/Sensory Function Overall Oral Motor/Sensory Function: Mild impairment Facial ROM: Within Functional Limits Facial Symmetry: Within Functional Limits Facial Strength: Within Functional Limits Facial Sensation: Reduced left;Other (Comment) Lingual ROM: Other (Comment) Lingual Symmetry: Within Functional Limits Lingual Strength: Within Functional Limits Lingual Sensation: Reduced Motor Speech Overall Motor Speech: Appears within functional limits for tasks assessed Respiration: Within functional limits Phonation: Normal Resonance: Within functional limits Articulation: Within functional limitis Intelligibility: Intelligible Motor Planning: Witnin functional limits Motor Speech Errors: Not applicable Interfering Components: Hearing  loss Effective Techniques: Over-articulate;Increased vocal intensity                      Tressie StalkerPat Adams, M.S., CCC-SLP 08/12/2018, 1:24 PM

## 2018-08-12 NOTE — Evaluation (Signed)
Clinical/Bedside Swallow Evaluation Patient Details  Name: Leah Figueroa MRN: [id number] Date of Birth: 21-Oct-1922  Today's Date: 08/12/2018 Time: SLP Start Time (ACUTE ONLY): 1202 SLP Stop Time (ACUTE ONLY): 1234 SLP Time Calculation (min) (ACUTE ONLY): 32 min  Past Medical History:  Past Medical History:  Diagnosis Date  . Acute myocardial infarction of other anterior wall, episode of care unspecified   . Arthritis   . Coronary artery disease   . Other and unspecified hyperlipidemia   . Personal history of arthritis   . Personal history of malignant neoplasm of breast    s/p lumpectomy  . Stroke [facility](HCC) 03/2015   "left sided issues"  . Unspecified essential hypertension   . Unspecified hypothyroidism    Past Surgical History:  Past Surgical History:  Procedure Laterality Date  . BACK SURGERY    . CATARACT EXTRACTION     left-2006, right 2011  . CHOLECYSTECTOMY    . intrathoracic goiter resection    . lumpectomy for breast cancer Left 80's   radiation therapy  . right thyroid lobectomy     Hx Goiter   HPI:  84 y.o. female with medical history significant for hypertension, CAD, Hx of right MCA stroke, hypothyroidism, and dementia who presents the ED with left-sided weakness, confusion, and dizziness.  Patient [facility] ED [facility] 08/07/2018 for left arm weakness MRI head on 08/11/18 indicated Acute ischemic right posterior MCA territory infarct as above. No associated hemorrhage or mass effect. 2. Additional chronic right posterior MCA territory infarct, with multiple chronic bilateral cerebellar infarcts, right greater than left.  Assessment / Plan / Recommendation Clinical Impression   Pt presents with grossly normal oropharyngeal swallow; she exhibits decreased sustained attention which places her at mild risk for aspiration d/t impulsivity/cognitive-based dysphagia, but pt only consuming small amounts of all consistencies given despite  encouragement/mod verbal cues to increase viscosity/bite size; daughter stated pt c/o difficulty with taking medication and globus sensation; slight left oral sensory deficit noted, but functional for swallowing efficiency.  Recommend Regular/thin liquid diet with general swallowing precautions in place; ST will f/u for diet tolerance and education re: swallowing safety/efficiency during PO consumption. SLP Visit Diagnosis: Dysphagia, unspecified (R13.10)    Aspiration Risk  Mild aspiration risk    Diet Recommendation   Regular/thin liquids  Medication Administration: Whole meds with puree    Other  Recommendations Oral Care Recommendations: Oral care BID   Follow up Recommendations Inpatient Rehab      Frequency and Duration min 2x/week  1 week       Prognosis Prognosis for Safe Diet Advancement: Good Barriers to Reach Goals: Cognitive deficits      Swallow Study   General Date of Onset: 08/11/18 HPI: 84 y.o. female with medical history significant for hypertension, CAD, Hx of right MCA stroke, hypothyroidism, and dementia who presents the ED with left-sided weakness, confusion, and dizziness.  [facility] for left arm weakness Type of Study: Bedside Swallow Evaluation Previous Swallow Assessment: ([place] failed d/t stopping drinking during 3 oz water ) Diet Prior to this Study: NPO Temperature Spikes Noted: No Respiratory Status: Room air History of Recent Intubation: No Behavior/Cognition: Alert;Cooperative;Confused;Requires cueing;Distractible Oral Cavity Assessment: Within Functional Limits Oral Care Completed by SLP: Recent completion by staff Oral Cavity - Dentition: Adequate natural dentition;Missing dentition;Poor condition Vision: Functional for [place]-feeding Sivils-Feeding Abilities: Able to feed Joe;Needs assist Patient Positioning: Upright in chair Baseline Vocal Quality: Normal Volitional  Cough: Strong Volitional  Swallow: Able to elicit    Oral/Motor/Sensory Function Overall Oral Motor/Sensory Function: Mild impairment Facial ROM: Within Functional Limits Facial Symmetry: Within Functional Limits Facial Strength: Within Functional Limits Facial Sensation: Reduced left;Other (Comment)(slight) Lingual ROM: Other (Comment)(DTA) Lingual Symmetry: Within Functional Limits Lingual Strength: Within Functional Limits Lingual Sensation: Reduced   Ice Chips Ice chips: Within functional limits Presentation: Spoon   Thin Liquid Thin Liquid: (Pt would not swallow more than small amount with sip/straw) Presentation: Cup;Straw;Spoon    Nectar Thick Nectar Thick Liquid: Not tested   Honey Thick Honey Thick Liquid: Not tested   Puree Puree: Within functional limits Presentation: Spoon   Solid     Solid: Within functional limits Presentation: [place] Fed Other Comments: (Pt prefers small amounts only; refused larger amounts )      Tressie StalkerPat Adams, M.S., CCC-SLP 08/12/2018,1:06 PM

## 2018-08-12 NOTE — Progress Notes (Addendum)
Initial Nutrition Assessment  DOCUMENTATION CODES:   Not applicable  INTERVENTION:    Ensure Enlive po BID, each supplement provides 350 kcal and 20 grams of protein  NUTRITION DIAGNOSIS:   Inadequate oral intake related to (confusion) as evidenced by meal completion < 25%  GOAL:   Patient will meet greater than or equal to 90% of their needs  MONITOR:   PO intake, Supplement acceptance, Labs, Skin, Weight trends  REASON FOR ASSESSMENT:   Malnutrition Screening Tool  ASSESSMENT:   84 y.o. Female with medical history significant for hypertension, CAD, Hx of right MCA stroke, hypothyroidism, and dementia who presents the ED with left-sided weakness, confusion, and dizziness.  RD unable to obtain nutrition history. Speech Path evaluating pt at time of visit.  S/p bedside swallow; SLP rec Regular, thin liquids. Spoke with RN in afternoon; reported pt refused lunch. Pt remains very confused at this time.  Would benefit from nutrition supplements. PT/SLP recommending acute rehab. Labs & medications reviewed.  NUTRITION - FOCUSED PHYSICAL EXAM:  Unable to complete at this time.  Diet Order:   Diet Order            Diet regular Room service appropriate? Yes; Fluid consistency: Thin  Diet effective now             EDUCATION NEEDS:   Not appropriate for education at this time  Skin:  Skin Assessment: Reviewed RN Assessment  Last BM:  12/26  Height:   Ht Readings from Last 1 Encounters:  08/12/18 4\' 5"  (1.346 m)   Weight:   Wt Readings from Last 1 Encounters:  08/12/18 40.3 kg   BMI:  Body mass index is 22.24 kg/m.  Estimated Nutritional Needs:   Kcal:  1000-1200  Protein:  50-65 gm  Fluid:  >/= 1.5 L  Maureen ChattersKatie Khyson Sebesta, RD, LDN Pager #: [phone] After-Hours Pager #: [phone]

## 2018-08-13 DIAGNOSIS — E785 Hyperlipidemia, unspecified: Secondary | ICD-10-CM

## 2018-08-13 DIAGNOSIS — I480 Paroxysmal atrial fibrillation: Secondary | ICD-10-CM

## 2018-08-13 LAB — HEMOGLOBIN A1C
Hgb A1c MFr Bld: 4.8 % (ref 4.8–5.6)
Mean Plasma Glucose: 91.06 mg/dL

## 2018-08-13 LAB — CBC WITH DIFFERENTIAL/PLATELET
Abs Immature Granulocytes: 0.05 10*3/uL (ref 0.00–0.07)
BASOS ABS: 0 10*3/uL (ref 0.0–0.1)
Basophils Relative: 0 %
Eosinophils Absolute: 0 10*3/uL (ref 0.0–0.5)
Eosinophils Relative: 0 %
HCT: 42.1 % (ref 36.0–46.0)
Hemoglobin: 14.1 g/dL (ref 12.0–15.0)
IMMATURE GRANULOCYTES: 1 %
Lymphocytes Relative: 14 %
Lymphs Abs: 1.4 10*3/uL (ref 0.7–4.0)
MCH: 30.7 pg (ref 26.0–34.0)
MCHC: 33.5 g/dL (ref 30.0–36.0)
MCV: 91.5 fL (ref 80.0–100.0)
Monocytes Absolute: 1 10*3/uL (ref 0.1–1.0)
Monocytes Relative: 10 %
NRBC: 0 % (ref 0.0–0.2)
Neutro Abs: 7.5 10*3/uL (ref 1.7–7.7)
Neutrophils Relative %: 75 %
PLATELETS: 257 10*3/uL (ref 150–400)
RBC: 4.6 MIL/uL (ref 3.87–5.11)
RDW: 12.7 % (ref 11.5–15.5)
WBC: 9.9 10*3/uL (ref 4.0–10.5)

## 2018-08-13 LAB — BASIC METABOLIC PANEL
Anion gap: 8 (ref 5–15)
BUN: 6 mg/dL — ABNORMAL LOW (ref 8–23)
CO2: 24 mmol/L (ref 22–32)
Calcium: 8.9 mg/dL (ref 8.9–10.3)
Chloride: 105 mmol/L (ref 98–111)
Creatinine, Ser: 0.41 mg/dL — ABNORMAL LOW (ref 0.44–1.00)
GFR calc non Af Amer: >60 mL/min (ref >60)
Glucose, Bld: 103 mg/dL — ABNORMAL HIGH (ref 70–99)
Potassium: 3.8 mmol/L (ref 3.5–5.1)
Sodium: 137 mmol/L (ref 135–145)

## 2018-08-13 LAB — MAGNESIUM: Magnesium: 2 mg/dL (ref 1.7–2.4)

## 2018-08-13 LAB — TROPONIN I: Troponin I: <0.03 ng/mL (ref <0.03)

## 2018-08-13 MED ORDER — ENOXAPARIN SODIUM 30 MG/0.3ML ~~LOC~~ SOLN
30.0000 mg | SUBCUTANEOUS | Status: DC
  Administered 2018-08-14 – 2018-08-15 (×2): 30 mg via SUBCUTANEOUS
  Filled 2018-08-13 (×2): qty 0.3

## 2018-08-13 MED ORDER — ATORVASTATIN CALCIUM 40 MG PO TABS
40.0000 mg | ORAL_TABLET | Freq: Every day | ORAL | Status: DC
  Administered 2018-08-13 – 2018-08-14 (×2): 40 mg via ORAL
  Filled 2018-08-13 (×2): qty 1

## 2018-08-13 NOTE — Progress Notes (Signed)
PROGRESS NOTE    Leah Figueroa  ZOX:[id number] DOB: 1923-06-02 DOA: 08/11/2018 PCP: Merlene Laughter, MD   Brief Narrative: Patient is a 84 year old female with past medical history of hypertension, coronary disease, history of right MCA stroke, hypothyroidism, dementia who was admitted on 08/11/2018 when she presented from home with complaints of left-sided weakness.  Found to have subacute CVA.  Neurology following.  Plan is to discharge her to CIR when she is ready.  Assessment & Plan:   Principal Problem:   Acute right MCA stroke (HCC) Active Problems:   Hypothyroidism   Essential hypertension   Coronary artery disease   Abdominal pain  Acute to subacute right MCA ischemic infarct : MRI showed acute right posterior MCA territory infarct without hemorrhage or mass-effect.  She has chronic right posterior MCA infarct as well as cerebral infarct.  MRA did not show any large vessel occlusion.  She presented with left-sided weakness. Echocardiogram showed preserved ejection fraction.  Carotid Doppler did not show any significant carotid artery stenosis. She was on Plavix at home.  Currently she is on aspirin.  She might likely need dual antiplatelet regimen, will defer to neurology. PT/OT evaluation done.  Recommended CIR. Hemoglobin A1c and lipid panel.  Continue statin at current dose for now.  Hypertension: Slightly hypertensive this morning.  Home medications on hold for allowing permissive hypertension.  We will continue to monitor her blood pressure.  Consider restarting her blood pressure medications tomorrow if she remains hypertensive.  Paroxysmal A. fib with RVR: Currently on normal sinus rhythm.  Transient episode.  Echocardiogram shows preserved ejection fraction.  Continue to monitor on telemetry.  Hypothyroidism: Continue Synthyroid  Abdomen pain: Currently resolved.  Continue MiraLAX.  KUB did not show any bowel obstruction.  Dementia: Patient is alert but not oriented.   Continue supportive care.  She is a DNR.  Debility/deconditioning: Patient evaluated by PT and recommended CIR.  Consult placed    Nutrition Problem: Inadequate oral intake Etiology: (confusion)      DVT prophylaxis: Lovenox Code Status: Full code Family Communication: None present at the bedside Disposition Plan: Likely CIR after neurology clearance   Consultants: Neurology  Procedures: MRI  Antimicrobials: None  Subjective: Patient seen and examined at the bedside this morning.  Found to be mildly hypertensive.  Looks comfortable.  Denies any chest pain, shortness of breath, headache.  She is quite demented.  Very hard on hearing.  Rarely follows commands.  Objective: Vitals:   08/12/18 2015 08/12/18 2300 08/13/18 0459 08/13/18 0750  BP: (!) 159/78 (!) 160/82 (!) 166/84   Pulse:   74   Resp:      Temp: 98 F (36.7 C) 98.1 F (36.7 C) (!) 97.5 F (36.4 C) 97.7 F (36.5 C)  TempSrc: Axillary Axillary Axillary Axillary  SpO2:   93%   Weight:      Height:        Intake/Output Summary (Last 24 hours) at 08/13/2018 8119 Last data filed at 08/13/2018 1478 Gross per 24 hour  Intake 1239.73 ml  Output 1501 ml  Net -261.27 ml   Filed Weights   08/12/18 0005  Weight: 40.3 kg    Examination:  General exam: Appears calm and comfortable ,Not in distress,thin elderly female  HEENT:PERRL,Oral mucosa moist, Ear/Nose normal on gross exam Respiratory system: Bilateral equal air entry, normal vesicular breath sounds, no wheezes or crackles  Cardiovascular system: S1 & S2 heard, RRR. No JVD, murmurs, rubs, gallops or clicks. No pedal edema. Gastrointestinal  system: Abdomen is nondistended, soft and nontender. No organomegaly or masses felt. Normal bowel sounds heard. Central nervous system: Alert but not oriented.  Neurological examination difficult because she does not follow commands.  Apparently looks weak on the left side with power of  3+/5 on the upper and lower  extremities  extremities: No edema, no clubbing ,no cyanosis, distal peripheral pulses palpable. Skin: No rashes, lesions or ulcers,no icterus ,no pallor,senile purpura Psychiatry: Judgement and insight appear impaired   Data Reviewed: I have personally reviewed following labs and imaging studies  CBC: Recent Labs  Lab 08/07/18 2113 08/11/18 1826 08/12/18 1435 08/13/18 0537  WBC 6.4 8.2 9.6 9.9  NEUTROABS 4.3  --  7.7 7.5  HGB 12.3 14.0 13.0 14.1  HCT 38.2 42.1 39.8 42.1  MCV 97.4 95.7 92.3 91.5  PLT 245 243 248 257   Basic Metabolic Panel: Recent Labs  Lab 08/07/18 2113 08/11/18 1826 08/12/18 1435 08/13/18 0202  NA 140 140 140 137  K 4.0 3.8 3.4* 3.8  CL 106 104 106 105  CO2 23 22 19* 24  GLUCOSE 183* 133* 111* 103*  BUN 13 13 6* 6*  CREATININE 0.73 0.59 0.61 0.41*  CALCIUM 8.9 9.1 9.1 8.9  MG  --   --  1.6* 2.0   GFR: Estimated Creatinine Clearance: 23.4 mL/min (A) (by C-G formula based on SCr of 0.41 mg/dL (L)). Liver Function Tests: Recent Labs  Lab 08/07/18 2113 08/11/18 1826 08/12/18 1435  AST 28 28 26   ALT 16 16 16   ALKPHOS 59 63 57  BILITOT 0.6 1.5* 1.2  PROT 6.7 7.8 6.8  ALBUMIN 3.6 4.1 3.6   No results for input(s): LIPASE, AMYLASE in the last 168 hours. No results for input(s): AMMONIA in the last 168 hours. Coagulation Profile: No results for input(s): INR, PROTIME in the last 168 hours. Cardiac Enzymes: Recent Labs  Lab 08/12/18 1435 08/12/18 2001 08/13/18 0202  TROPONINI <0.03 <0.03 <0.03   BNP (last 3 results) No results for input(s): PROBNP in the last 8760 hours. HbA1C: No results for input(s): HGBA1C in the last 72 hours. CBG: No results for input(s): GLUCAP in the last 168 hours. Lipid Profile: No results for input(s): CHOL, HDL, LDLCALC, TRIG, CHOLHDL, LDLDIRECT in the last 72 hours. Thyroid Function Tests: Recent Labs    08/12/18 1435  TSH 0.525  FREET4 1.42   Anemia Panel: No results for input(s): VITAMINB12,  FOLATE, FERRITIN, TIBC, IRON, RETICCTPCT in the last 72 hours. Sepsis Labs: No results for input(s): PROCALCITON, LATICACIDVEN in the last 168 hours.  Recent Results (from the past 240 hour(s))  Urine culture     Status: Abnormal   Collection Time: 08/07/18  3:44 AM  Result Value Ref Range Status   Specimen Description URINE, RANDOM  Final   Special Requests NONE  Final   Culture (A)  Final    <10,000 COLONIES/mL INSIGNIFICANT GROWTH Performed at [facility], [address], [place], [place] [address]    Report Status 08/09/2018 FINAL  Final         Radiology Studies: Dg Abd 1 View  Result Date: 08/11/2018 CLINICAL DATA:  Nausea. EXAM: ABDOMEN - 1 VIEW COMPARISON:  None. FINDINGS: The bowel gas pattern is normal. Status post cholecystectomy. No radio-opaque calculi or other significant radiographic abnormality are seen. IMPRESSION: No evidence of bowel obstruction or ileus. Electronically Signed   By: Lupita Raider, M.D.   On: 08/11/2018 21:15   Ct Head Wo Contrast  Result Date: 08/11/2018 CLINICAL DATA:  Nausea and dizziness, worse with movement. EXAM: CT HEAD WITHOUT CONTRAST TECHNIQUE: Contiguous axial images were obtained from the base of the skull through the vertex without intravenous contrast. COMPARISON:  08/07/2018 FINDINGS: Brain: Interval development of right nonhemorrhagic MCA territory infarct with loss of gray-white matter distinction involving the right temporal and parietal lobes. This is superimposed upon chronic moderate microvascular ischemic disease periventricular, deep and subcortical white matter. No hydrocephalus midline shift. Chronic remote right cerebellar infarct. Midline fourth ventricle and basal cisterns. No intra-axial mass nor extra-axial fluid. Vascular: Atherosclerosis of the carotid siphons. No hyperdense vessels noted. Skull: No acute skull fracture. No suspicious osseous lesions. Sinuses/Orbits: No acute finding. Other: None. IMPRESSION: 1.  Late acute to early subacute right MCA territory infarct with loss of gray-white matter distinction involving the right temporal and parietal lobes. No hemorrhage noted. These results were called by telephone at the time of interpretation on 08/11/2018 at 7:31 pm to Dr. Margarita GrizzleANIELLE RAY , who verbally acknowledged these results. 2. Chronic moderate small vessel ischemic disease of periventricular, deep and subcortical white matter. 3. Chronic right cerebellar infarct. Electronically Signed   By: Tollie Ethavid  Kwon M.D.   On: 08/11/2018 19:29   Mr Brain Wo Contrast  Result Date: 08/11/2018 CLINICAL DATA:  Initial evaluation for acute stroke. EXAM: MRI HEAD WITHOUT CONTRAST MRA HEAD WITHOUT CONTRAST TECHNIQUE: Multiplanar, multiecho pulse sequences of the brain and surrounding structures were obtained without intravenous contrast. Angiographic images of the head were obtained using MRA technique without contrast. COMPARISON:  Prior CT from earlier the same day. FINDINGS: MRI HEAD FINDINGS Brain: Generalized age-related cerebral atrophy. Confluent T2/FLAIR hyperintensity within the periventricular deep white matter both cerebral hemispheres most consistent with chronic microvascular ischemic disease, advanced in nature. Encephalomalacia with gliosis within the right parietal region consistent with remote right posterior MCA territory infarct. Multiple additional scattered remote bilateral cerebellar infarcts, right greater than left. Confluent restricted diffusion seen involving the posterior right parietal temporal and occipital lobes, consistent with acute posterior right MCA territory infarct. This is positioned immediately adjacent to the chronic right MCA territory infarct. No associated hemorrhage or mass effect. No other areas of acute or subacute infarction. Gray-white matter differentiation otherwise maintained. Multiple chronic micro hemorrhages noted, predominantly localized to the cerebellum, most like related to  poorly controlled hypertension. No mass lesion, midline shift or mass effect. No hydrocephalus. No extra-axial fluid collection. Pituitary gland within normal limits. Vascular: Major intracranial vascular flow voids maintained. Skull and upper cervical spine: Craniocervical junction within normal limits. Visualized upper cervical spine within normal limits. Bone marrow signal intensity normal. No scalp soft tissue abnormality. Sinuses/Orbits: Patient status post bilateral ocular lens replacement. Paranasal sinuses are clear. No mastoid effusion. Inner ear structures grossly normal. Other: None. MRA HEAD FINDINGS ANTERIOR CIRCULATION: Examination degraded by motion artifact. Distal cervical segments of the internal carotid arteries are patent with symmetric antegrade flow. Petrous segments widely patent bilaterally. Cavernous/supraclinoid ICAs demonstrate mild multifocal atheromatous irregularity but are patent without hemodynamically significant stenosis. Right ICA terminus mildly ectatic. A1 segments widely patent bilaterally. Anterior communicating artery not well assessed. Anterior cerebral arteries patent to their distal aspects without proximal high-grade stenosis. M1 segments mildly irregular but are patent to their distal aspects without high-grade stenosis or occlusion. Normal MCA bifurcations. No proximal M2 occlusion. Distal right MCA branches attenuated as compared to the left, like related to chronic right MCA territory infarction. Distal small vessel atheromatous irregularity noted bilaterally. POSTERIOR CIRCULATION: Dominant left vertebral  artery patent to the vertebrobasilar junction without stenosis. Right vertebral artery somewhat diminutive with multifocal atheromatous irregularity without high-grade stenosis. Left vertebral artery dominant. Left PICA patent proximally. Right PICA not well visualized. Basilar mildly tortuous with superimposed short-segment mild stenosis at its distal aspect (series  604, image 1). Superior cerebral arteries patent bilaterally. Left PCA supplied via the basilar. Right PCA supplied via the basilar as well as a prominent right posterior communicating artery. PCAs patent to their distal aspects without flow-limiting stenosis. Distal small vessel atheromatous irregularity. No intracranial aneurysm. IMPRESSION: MRI HEAD IMPRESSION: 1. Acute ischemic right posterior MCA territory infarct as above. No associated hemorrhage or mass effect. 2. Additional chronic right posterior MCA territory infarct, with multiple chronic bilateral cerebellar infarcts, right greater than left. 3. Multiple chronic micro hemorrhages seen scattered throughout the cerebellum, most consistent with chronic poorly controlled hypertension. 4. Underlying age-related cerebral atrophy with advanced chronic microvascular ischemic disease. MRA HEAD IMPRESSION: 1. Negative intracranial MRA for large vessel occlusion. 2. Attenuation of the right MCA branches as compared to the left, likely related to the patient's chronic right MCA territory infarction. 3. Moderate small vessel atheromatous irregularity throughout the intracranial circulation. No proximal high-grade or correctable stenosis identified. Electronically Signed   By: Rise Mu M.D.   On: 08/11/2018 22:33   Dg Chest Port 1 View  Result Date: 08/11/2018 CLINICAL DATA:  Altered mental status. Weakness. Nausea and dizziness. EXAM: PORTABLE CHEST 1 VIEW COMPARISON:  08/07/2018 FINDINGS: The cardiomediastinal silhouette is unchanged with borderline cardiomegaly and extensive mitral annular calcification noted. There is aortic atherosclerosis. Lung volumes are low with mild elevation of the right hemidiaphragm. Medial left retrocardiac density corresponds to a known hiatal hernia. No definite airspace consolidation, sizeable pleural effusion, or pneumothorax is identified. Surgical clips are noted in the lower neck, left axilla, and right upper  abdomen. No acute osseous abnormality is seen. IMPRESSION: Low lung volumes without evidence of acute airspace disease. Electronically Signed   By: Sebastian Ache M.D.   On: 08/11/2018 19:11   Mr Maxine Glenn Head Wo Contrast  Result Date: 08/11/2018 CLINICAL DATA:  Initial evaluation for acute stroke. EXAM: MRI HEAD WITHOUT CONTRAST MRA HEAD WITHOUT CONTRAST TECHNIQUE: Multiplanar, multiecho pulse sequences of the brain and surrounding structures were obtained without intravenous contrast. Angiographic images of the head were obtained using MRA technique without contrast. COMPARISON:  Prior CT from earlier the same day. FINDINGS: MRI HEAD FINDINGS Brain: Generalized age-related cerebral atrophy. Confluent T2/FLAIR hyperintensity within the periventricular deep white matter both cerebral hemispheres most consistent with chronic microvascular ischemic disease, advanced in nature. Encephalomalacia with gliosis within the right parietal region consistent with remote right posterior MCA territory infarct. Multiple additional scattered remote bilateral cerebellar infarcts, right greater than left. Confluent restricted diffusion seen involving the posterior right parietal temporal and occipital lobes, consistent with acute posterior right MCA territory infarct. This is positioned immediately adjacent to the chronic right MCA territory infarct. No associated hemorrhage or mass effect. No other areas of acute or subacute infarction. Gray-white matter differentiation otherwise maintained. Multiple chronic micro hemorrhages noted, predominantly localized to the cerebellum, most like related to poorly controlled hypertension. No mass lesion, midline shift or mass effect. No hydrocephalus. No extra-axial fluid collection. Pituitary gland within normal limits. Vascular: Major intracranial vascular flow voids maintained. Skull and upper cervical spine: Craniocervical junction within normal limits. Visualized upper cervical spine within  normal limits. Bone marrow signal intensity normal. No scalp soft tissue abnormality. Sinuses/Orbits: Patient status post bilateral ocular  lens replacement. Paranasal sinuses are clear. No mastoid effusion. Inner ear structures grossly normal. Other: None. MRA HEAD FINDINGS ANTERIOR CIRCULATION: Examination degraded by motion artifact. Distal cervical segments of the internal carotid arteries are patent with symmetric antegrade flow. Petrous segments widely patent bilaterally. Cavernous/supraclinoid ICAs demonstrate mild multifocal atheromatous irregularity but are patent without hemodynamically significant stenosis. Right ICA terminus mildly ectatic. A1 segments widely patent bilaterally. Anterior communicating artery not well assessed. Anterior cerebral arteries patent to their distal aspects without proximal high-grade stenosis. M1 segments mildly irregular but are patent to their distal aspects without high-grade stenosis or occlusion. Normal MCA bifurcations. No proximal M2 occlusion. Distal right MCA branches attenuated as compared to the left, like related to chronic right MCA territory infarction. Distal small vessel atheromatous irregularity noted bilaterally. POSTERIOR CIRCULATION: Dominant left vertebral artery patent to the vertebrobasilar junction without stenosis. Right vertebral artery somewhat diminutive with multifocal atheromatous irregularity without high-grade stenosis. Left vertebral artery dominant. Left PICA patent proximally. Right PICA not well visualized. Basilar mildly tortuous with superimposed short-segment mild stenosis at its distal aspect (series 604, image 1). Superior cerebral arteries patent bilaterally. Left PCA supplied via the basilar. Right PCA supplied via the basilar as well as a prominent right posterior communicating artery. PCAs patent to their distal aspects without flow-limiting stenosis. Distal small vessel atheromatous irregularity. No intracranial aneurysm.  IMPRESSION: MRI HEAD IMPRESSION: 1. Acute ischemic right posterior MCA territory infarct as above. No associated hemorrhage or mass effect. 2. Additional chronic right posterior MCA territory infarct, with multiple chronic bilateral cerebellar infarcts, right greater than left. 3. Multiple chronic micro hemorrhages seen scattered throughout the cerebellum, most consistent with chronic poorly controlled hypertension. 4. Underlying age-related cerebral atrophy with advanced chronic microvascular ischemic disease. MRA HEAD IMPRESSION: 1. Negative intracranial MRA for large vessel occlusion. 2. Attenuation of the right MCA branches as compared to the left, likely related to the patient's chronic right MCA territory infarction. 3. Moderate small vessel atheromatous irregularity throughout the intracranial circulation. No proximal high-grade or correctable stenosis identified. Electronically Signed   By: Rise MuBenjamin  McClintock M.D.   On: 08/11/2018 22:33   [facility] [place] Carotid [facility]  Result Date: 08/13/2018 Carotid Arterial Duplex Study Indications:       CVA and Weakness. Risk Factors:      Hypertension, coronary artery disease. Other Factors:     Dementia. Comparison Study:  Prior study on file from 05/03/17 for comparison Performing Technologist: Sherren Kernsandace Kanady RVS  Examination Guidelines: A complete evaluation includes B-mode imaging, spectral Doppler, color Doppler, and power Doppler as needed of all accessible portions of each vessel. Bilateral testing is considered an integral part of a complete examination. Limited examinations for reoccurring indications may be performed as noted.  Right Carotid Findings: +----------+--------+--------+--------+--------+------------------+           PSV cm/sEDV cm/sStenosisDescribeComments           +----------+--------+--------+--------+--------+------------------+ CCA Prox  57      11                      intimal thickening  +----------+--------+--------+--------+--------+------------------+ CCA Distal60      18                      intimal thickening +----------+--------+--------+--------+--------+------------------+ ICA Prox  70      15                                         +----------+--------+--------+--------+--------+------------------+  ICA Distal83      18                                         +----------+--------+--------+--------+--------+------------------+ ECA       86      14                                         +----------+--------+--------+--------+--------+------------------+ +----------+--------+-------+--------+-------------------+           PSV cm/sEDV cmsDescribeArm Pressure (mmHG) +----------+--------+-------+--------+-------------------+ [id number]                                        +----------+--------+-------+--------+-------------------+ +---------+--------+--+--------+-+ VertebralPSV cm/s27EDV cm/s3 +---------+--------+--+--------+-+  Left Carotid Findings: +----------+--------+--------+--------+------------+------------------+           PSV cm/sEDV cm/sStenosisDescribe    Comments           +----------+--------+--------+--------+------------+------------------+ CCA Prox  67      18                          intimal thickening +----------+--------+--------+--------+------------+------------------+ CCA Distal75      17                          intimal thickening +----------+--------+--------+--------+------------+------------------+ ICA Prox  44      10              heterogenous                   +----------+--------+--------+--------+------------+------------------+ ICA Distal63      14                                             +----------+--------+--------+--------+------------+------------------+ ECA       94      8                                               +----------+--------+--------+--------+------------+------------------+ +----------+--------+--------+--------+-------------------+ SubclavianPSV cm/sEDV cm/sDescribeArm Pressure (mmHG) +----------+--------+--------+--------+-------------------+           121                                         +----------+--------+--------+--------+-------------------+ +---------+--------+--+--------+--+ VertebralPSV cm/s69EDV cm/s22 +---------+--------+--+--------+--+  Summary: Right Carotid: The extracranial vessels were near-normal with only minimal wall                thickening or plaque. Left Carotid: Velocities in the left ICA are consistent with a 1-39% stenosis. Vertebrals:  Bilateral vertebral arteries demonstrate antegrade flow. Subclavians: Normal flow hemodynamics were seen in bilateral subclavian              arteries. *See table(s) above for measurements and observations.  Electronically signed by Gretta Began MD on 08/13/2018 at 9:04:21 AM.    Final  Scheduled Meds: . ALPRAZolam  0.25 mg Oral QHS  . aspirin  300 mg Rectal Daily   Or  . aspirin  325 mg Oral Daily  . atorvastatin  40 mg Oral QHS  . enoxaparin (LOVENOX) injection  40 mg Subcutaneous Q24H  . feeding supplement (ENSURE ENLIVE)  237 mL Oral BID BM  . levothyroxine  25 mcg Oral Q0600  . metoprolol tartrate  50 mg Oral BID   Continuous Infusions:   LOS: 2 days    Time spent: 35 mins.More than 50% of that time was spent in counseling and/or coordination of care.      Burnadette Pop, MD [facility] Pager [phone]  If 7PM-7AM, please contact night-coverage [email or web address] Password [facility] 08/13/2018, 9:33 AM

## 2018-08-14 DIAGNOSIS — L8990 Pressure ulcer of unspecified site, unspecified stage: Secondary | ICD-10-CM

## 2018-08-14 DIAGNOSIS — I63511 Cerebral infarction due to unspecified occlusion or stenosis of right middle cerebral artery: Secondary | ICD-10-CM

## 2018-08-14 DIAGNOSIS — I5189 Other ill-defined heart diseases: Secondary | ICD-10-CM

## 2018-08-14 DIAGNOSIS — I2510 Atherosclerotic heart disease of native coronary artery without angina pectoris: Secondary | ICD-10-CM

## 2018-08-14 DIAGNOSIS — Z8673 Personal history of transient ischemic attack (TIA), and cerebral infarction without residual deficits: Secondary | ICD-10-CM

## 2018-08-14 DIAGNOSIS — F0390 Unspecified dementia without behavioral disturbance: Secondary | ICD-10-CM

## 2018-08-14 DIAGNOSIS — I639 Cerebral infarction, unspecified: Secondary | ICD-10-CM

## 2018-08-14 DIAGNOSIS — R0682 Tachypnea, not elsewhere classified: Secondary | ICD-10-CM

## 2018-08-14 DIAGNOSIS — I10 Essential (primary) hypertension: Secondary | ICD-10-CM

## 2018-08-14 LAB — LIPID PANEL
Cholesterol: 115 mg/dL (ref 0–200)
HDL: 35 mg/dL — ABNORMAL LOW (ref >40)
LDL Cholesterol: 63 mg/dL (ref 0–99)
Total CHOL/HDL Ratio: 3.3 RATIO
Triglycerides: 85 mg/dL (ref <150)
VLDL: 17 mg/dL (ref 0–40)

## 2018-08-14 NOTE — Progress Notes (Signed)
  Pt was converted into A.fib with RVR. Heart rate in 200. EKG done. MD paged.   1433 MD at bedside. Lopressor IV and NS bolus given. Pt converted back to sinus rhythm. Will continue to monitor pt.

## 2018-08-14 NOTE — Progress Notes (Signed)
Physical Therapy Treatment Patient Details Name: Leah FillerMabel E Hata MRN: [id number] DOB: 1923/04/19 Today's Date: 08/14/2018    History of Present Illness Pt is a 84 y/o female admitted secondary to dizziness and L sided weakness. MRI revealed  large right temporal and parietal lobe ischemic infarction. PMH including but not limited to HTN, CAD, prior stroke with L sided weakness and dementia.     PT Comments    Patient seen for mobility progression. Does require consistent cueing to complete each tasks successfully. Patient ambulating in hallway today for 5350' with RW with Min A +2 - noted poor awareness of L side with cueing to bring attention as well as to turn towards this side. Poor safety awareness and obstacle navigation also noted with Min A for RW management. Will continue to recommend CIR at discharge to continue to progress safe functional mobility prior to return home. PT to continue to follow.     Follow Up Recommendations  CIR     Equipment Recommendations  None recommended by PT    Recommendations for Other Services Rehab consult     Precautions / Restrictions Precautions Precautions: Fall Precaution Comments: L sided inattention Restrictions Weight Bearing Restrictions: No    Mobility  Bed Mobility Overal bed mobility: Needs Assistance Bed Mobility: Supine to Sit     Supine to sit: Min assist     General bed mobility comments: Light Min A to achieve full upright sitting posture  Transfers Overall transfer level: Needs assistance Equipment used: Rolling walker (2 wheeled) Transfers: Sit to/from [employer] CorporationStand;Stand Pivot Transfers Sit to Stand: Min assist;Min guard Stand pivot transfers: Min guard       General transfer comment: light Min A to power up from bedside; cueing for hand placement and sequencing  Ambulation/Gait Ambulation/Gait assistance: Min assist;+2 safety/equipment Gait Distance (Feet): 50 Feet Assistive device: Rolling walker (2 wheeled) Gait  Pattern/deviations: Step-through pattern;Decreased stride length;Drifts right/left;Trunk flexed Gait velocity: decreased   General Gait Details: trunk flexion wtih tendency to drift towards R - difficulty locating objects on L side of hallway; requires assist to turn RW towards L; mild unsteadiness throughout with Min A for balance and safety   Stairs             Wheelchair Mobility    Modified Rankin (Stroke Patients Only) Modified Rankin (Stroke Patients Only) Pre-Morbid Rankin Score: Moderately severe disability Modified Rankin: Moderately severe disability     Balance Overall balance assessment: Needs assistance Sitting-balance support: No upper extremity supported;Feet supported Sitting balance-[name] Scale: Fair     Standing balance support: Bilateral upper extremity supported;During functional activity Standing balance-[name] Scale: Poor Standing balance comment: reliant on external support                            Cognition Arousal/Alertness: Awake/alert Behavior During Therapy: Impulsive Overall Cognitive Status: Impaired/Different from baseline Area of Impairment: Memory;Following commands;Safety/judgement;Problem solving                     Memory: Decreased short-term memory;Decreased recall of precautions Following Commands: Follows one step commands consistently;Follows one step commands with increased time Safety/Judgement: Decreased awareness of deficits;Decreased awareness of safety   Problem Solving: Difficulty sequencing;Requires verbal cues;Requires tactile cues General Comments: requires consistent verbal and tactile cueing to complete tasks successfully; once the RW was sat in front of her she immediately knew what to do with it      Exercises  General Comments        Pertinent Vitals/Pain Pain Assessment: No/denies pain    Home Living                      Prior Function            PT Goals  (current goals can now be found in the care plan section) Acute Rehab PT Goals Patient Stated Goal: to return to PLOF PT Goal Formulation: With patient/family Time For Goal Achievement: 08/26/18 Potential to Achieve Goals: Good Progress towards PT goals: Progressing toward goals    Frequency    Min 4X/week      PT Plan Current plan remains appropriate    Co-evaluation              AM-PAC PT "6 Clicks" Mobility   Outcome Measure  Help needed turning from your back to your side while in a flat bed without using bedrails?: None Help needed moving from lying on your back to sitting on the side of a flat bed without using bedrails?: A Little Help needed moving to and from a bed to a chair (including a wheelchair)?: A Little Help needed standing up from a chair using your arms (e.g., wheelchair or bedside chair)?: A Little Help needed to walk in hospital room?: A Little Help needed climbing 3-5 steps with a railing? : A Lot 6 Click Score: 18    End of Session Equipment Utilized During Treatment: Gait belt Activity Tolerance: Patient tolerated treatment well Patient left: in chair;with call bell/phone within reach;with chair alarm set;with family/visitor present Nurse Communication: Mobility status PT Visit Diagnosis: Other abnormalities of gait and mobility (R26.89)     Time: [id number] PT Time Calculation (min) (ACUTE ONLY): 20 min  Charges:  $Gait Training: 8-22 mins                     Kipp LaurenceStephanie R Tamora Huneke, PT, DPT Supplemental Physical Therapist 08/14/18 2:57 PM Pager: [phone] Office: [phone]

## 2018-08-14 NOTE — Progress Notes (Signed)
Inpatient Rehabilitation Admissions Coordinator  I met with patient and her daughter at bedside with therapy. I observed patient up with therapy and she did well. I will follow up with Rehab MD tomorrow. Pt's daughter prefers an inpt rehab admit rather than SNF. Daughter can provide 24/7 supervision at d/c as she was doing before.  Danne Baxter, RN, MSN Rehab Admissions Coordinator [phone] 08/14/2018 2:22 PM

## 2018-08-14 NOTE — NC FL2 (Signed)
MEDICAID FL2 LEVEL OF CARE SCREENING TOOL     IDENTIFICATION  Patient Name: Leah Figueroa Birthdate: 1923/03/05 Sex: female Admission Date (Current Location): 08/11/2018  [facility] [place]   Facility and Address:  [facility] [place], [address], [place], [place] [address]      Provider Number: [id number]  Attending Physician Name and Address:  Rolly SalterPatel, Pranav M, MD  Relative Name and Phone Number:       Current Level of Care: Hospital Recommended Level of Care: Skilled Nursing Facility Prior Approval Number:    Date Approved/Denied:   PASRR Number: [id number]  Discharge Plan: SNF    Current Diagnoses: Patient Active Problem List   Diagnosis Date Noted  . Cerebrovascular accident (CVA) (HCC)   . Diastolic dysfunction   . History of CVA (cerebrovascular accident)   . Dementia without behavioral disturbance (HCC)   . Tachypnea   . Acute right MCA stroke (HCC) 08/11/2018  . Abdominal pain 08/11/2018  . Hematochezia 05/15/2017  . Coronary artery disease 05/15/2017  . Other and unspecified hyperlipidemia 05/15/2017  . Hypothyroidism 05/15/2017  . HTN (hypertension) 05/15/2017  . TIA (transient ischemic attack) 05/02/2017  . History of stroke 03/07/2017  . Gait abnormality 03/07/2017  . Subacromial impingement of left shoulder 02/09/2016  . Hemiparesis affecting left side as late effect of stroke (HCC) 01/20/2016  . Vertigo 09/03/2015  . Dehydration 09/03/2015  . Acute lower UTI 09/03/2015  . Prolonged Q-T interval on ECG 09/03/2015  . Left-sided neglect 07/01/2015  . History of right MCA stroke 07/01/2015  . Altered sensation due to stroke 04/29/2015  . Cognitive deficit, post-stroke 04/29/2015  . Pseudogout of left wrist 03/28/2015  . Hyperlipidemia LDL goal <70 03/26/2015  . Hyperglycemia 03/26/2015  . 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 Embolic stroke (HCC)   . Left  hemiplegia (HCC)   . Homonymous hemianopia   . Chest wall trauma 01/11/2014  . Injury of left lower arm 01/11/2014  . Headache(784.0) 01/04/2013  . Allergic rhinitis, cause unspecified 01/04/2013  . Anemia 04/19/2012  . Epistaxis, recurrent 04/19/2012  . Hypothyroidism 01/01/2008  . Essential hypertension 01/01/2008  . HYPERTENSION 01/01/2008  . MYOCARDIAL INFARCTION, ACUTE, ANTERIOR WALL 01/01/2008  . LEG CRAMPS 01/01/2008  . ADENOCARCINOMA, BREAST, HX OF 01/01/2008  . DIVERTICULOSIS, COLON, HX OF 01/01/2008  . ARTHRITIS, HX OF 01/01/2008  . RADIATION THERAPY, HX OF 01/01/2008  . CATARACT EXTRACTION, HX OF 01/01/2008  . CHOLECYSTECTOMY, LAPAROSCOPIC, HX OF 01/01/2008    Orientation RESPIRATION BLADDER Height & Weight     [place]  Normal Incontinent Weight: 88 lb 13.5 oz (40.3 kg) Height:  4\' 5"  (134.6 cm)  BEHAVIORAL SYMPTOMS/MOOD NEUROLOGICAL BOWEL NUTRITION STATUS      Incontinent Diet(regular)  AMBULATORY STATUS COMMUNICATION OF NEEDS Skin   Limited Assist Verbally Normal                       Personal Care Assistance Level of Assistance  Bathing, Feeding, Dressing Bathing Assistance: Limited assistance Feeding assistance: Limited assistance Dressing Assistance: Limited assistance     Functional Limitations Info  Sight, Hearing, Speech Sight Info: Adequate Hearing Info: Adequate Speech Info: Adequate    SPECIAL CARE FACTORS FREQUENCY  PT (By licensed PT), OT (By licensed OT)     PT Frequency: 5x/wk OT Frequency: 5x/wk            Contractures Contractures Info: Not present  Additional Factors Info  Code Status, Allergies, Psychotropic Code Status Info: DNR Allergies Info: NKA Psychotropic Info: Xanax 0.25mg  daily at bed         Current Medications (08/14/2018):  This is the current hospital active medication list Current Facility-Administered Medications  Medication Dose Route Frequency Provider Last Rate Last Dose  . acetaminophen  (TYLENOL) tablet 650 mg  650 mg Oral Q4H PRN Charlsie QuestPatel, Vishal R, MD       Or  . acetaminophen (TYLENOL) solution 650 mg  650 mg Per Tube Q4H PRN Charlsie QuestPatel, Vishal R, MD       Or  . acetaminophen (TYLENOL) suppository 650 mg  650 mg Rectal Q4H PRN Charlsie QuestPatel, Vishal R, MD      . ALPRAZolam Prudy Feeler(XANAX) tablet 0.25 mg  0.25 mg Oral QHS Darreld McleanPatel, Vishal R, MD   0.25 mg at 08/13/18 2210  . aspirin suppository 300 mg  300 mg Rectal Daily Charlsie QuestPatel, Vishal R, MD       Or  . aspirin tablet 325 mg  325 mg Oral Daily Charlsie QuestPatel, Vishal R, MD   325 mg at 08/14/18 0933  . atorvastatin (LIPITOR) tablet 40 mg  40 mg Oral QHS Burnadette PopAdhikari, Amrit, MD   40 mg at 08/13/18 2209  . enoxaparin (LOVENOX) injection 30 mg  30 mg Subcutaneous Q24H Burnadette PopAdhikari, Amrit, MD   30 mg at 08/14/18 0933  . feeding supplement (ENSURE ENLIVE) (ENSURE ENLIVE) liquid 237 mL  237 mL Oral BID BM Rolly SalterPatel, Pranav M, MD   237 mL at 08/14/18 1319  . levothyroxine (SYNTHROID, LEVOTHROID) tablet 25 mcg  25 mcg Oral Q0600 Rolly SalterPatel, Pranav M, MD   25 mcg at 08/14/18 [id number]  . metoprolol tartrate (LOPRESSOR) injection 5 mg  5 mg Intravenous Q4H PRN Rolly SalterPatel, Pranav M, MD      . metoprolol tartrate (LOPRESSOR) tablet 50 mg  50 mg Oral BID Rolly SalterPatel, Pranav M, MD   50 mg at 08/14/18 [id number]  . senna-docusate (Senokot-S) tablet 1 tablet  1 tablet Oral QHS PRN Charlsie QuestPatel, Vishal R, MD         Discharge Medications: Please see discharge summary for a list of discharge medications.  Relevant Imaging Results:  Relevant Lab Results:   Additional Information SS#: [id number]  Baldemar LenisElizabeth M Kiyanna Biegler, LCSW

## 2018-08-14 NOTE — H&P (Signed)
Physical Medicine and Rehabilitation Admission H&P    Chief Complaint  Patient presents with  . Nausea  : HPI: Leah Figueroa is a 84 year old right-handed female history of hypertension, CAD, right MCA infarction in the past maintained on Plavix and received inpatient rehabilitation services 2016, dementia. Per chart review and daughter, patient lives with daughter. One level home with one step to entry. She also has a personal care attendant. Patient ambulated with the use of rolling walker prior to admission. Required some assistance for bathing and dressing. Presented 08/12/2018 with increasing left-sided weakness. CT of the head reviewed, showing right MCA infarction. MRI/MRA follow-up notes acute ischemic right posterior MCA territory infarction no associated hemorrhage. Multiple chronic microhemorrhage is seen scattered throughout the cerebellum. No large vessel occlusion. Echocardiogram with ejection fraction of 60% grade 2 diastolic dysfunction. Carotid Dopplers with no ICA stenosis. Maintain on aspirin for CVA prophylaxis. Subcutaneous Lovenox for DVT prophylaxis. Tolerating a regular diet. Therapy evaluations completed with recommendations of physical medicine rehabilitation consult. Patient was admitted for a compress rehabilitation program.  Review of Systems  Constitutional: Negative for chills and fever.  HENT: Positive for hearing loss.   Eyes: Negative for blurred vision and double vision.  Respiratory: Negative for cough and shortness of breath.   Cardiovascular: Positive for leg swelling. Negative for chest pain and palpitations.  Gastrointestinal: Positive for constipation. Negative for nausea and vomiting.  Genitourinary: Negative for dysuria, flank pain and hematuria.  Musculoskeletal: Positive for joint pain and myalgias.  Neurological: Positive for focal weakness.  Psychiatric/Behavioral: Positive for memory loss.  All other systems reviewed and are negative.  Past  Medical History:  Diagnosis Date  . Acute myocardial infarction of other anterior wall, episode of care unspecified   . Arthritis   . Coronary artery disease   . Other and unspecified hyperlipidemia   . Personal history of arthritis   . Personal history of malignant neoplasm of breast    s/p lumpectomy  . Stroke [facility](HCC) 03/2015   "left sided issues"  . Unspecified essential hypertension   . Unspecified hypothyroidism    Past Surgical History:  Procedure Laterality Date  . BACK SURGERY    . CATARACT EXTRACTION     left-2006, right 2011  . CHOLECYSTECTOMY    . intrathoracic goiter resection    . lumpectomy for breast cancer Left 80's   radiation therapy  . right thyroid lobectomy     Hx Goiter   Family History  Problem Relation Age of Onset  . Colon cancer Mother   . Breast cancer Mother   . Coronary artery disease Brother   . Dementia Sister    Social History:  reports that she has never smoked. She has never used smokeless tobacco. She reports that she does not drink alcohol or use drugs. Allergies: No Known Allergies Medications Prior to Admission  Medication Sig Dispense Refill  . ALPRAZolam (XANAX) 0.25 MG tablet Take 0.25 mg by mouth every morning.   0  . amLODipine (NORVASC) 2.5 MG tablet Take 7.5 mg by mouth daily.   6  . atorvastatin (LIPITOR) 10 MG tablet Take 10 mg by mouth at bedtime.     . clopidogrel (PLAVIX) 75 MG tablet Take 1 tablet (75 mg total) by mouth daily.    Marland Kitchen. levothyroxine (SYNTHROID, LEVOTHROID) 25 MCG tablet Take 1 tablet by mouth  daily (Patient taking differently: Take 25 mcg by mouth daily. ) 90 tablet 0  . Melatonin 10 MG TABS Take 10  mg by mouth at bedtime.     . metoprolol (LOPRESSOR) 50 MG tablet Take 50 mg by mouth 2 (two) times daily.  3  . acetaminophen (TYLENOL) 325 MG tablet Take 2 tablets (650 mg total) by mouth every 4 (four) hours as needed for mild pain. (Patient not taking: Reported on 08/07/2018)    . bisacodyl (DULCOLAX) 5 MG EC  tablet Take 1 tablet (5 mg total) by mouth daily as needed for moderate constipation. (Patient not taking: Reported on 08/07/2018) 30 tablet 0  . meclizine (ANTIVERT) 25 MG tablet Take 1 tablet (25 mg total) by mouth 3 (three) times daily. (Patient not taking: Reported on 08/07/2018) 30 tablet 0  . ondansetron (ZOFRAN) 8 MG tablet Take 1 tablet (8 mg total) by mouth every 8 (eight) hours as needed for nausea or vomiting. (Patient not taking: Reported on 08/07/2018) 10 tablet 0  . polyethylene glycol (MIRALAX / GLYCOLAX) packet Take 17 g by mouth at bedtime. (Patient not taking: Reported on 08/07/2018) 14 each 0    Drug Regimen Review Drug regimen was reviewed and remains appropriate with no significant issues identified  Home: Home Living Family/patient expects to be discharged to:: Private residence Living Arrangements: Children(daughter) Available Help at Discharge: Family, Personal care attendant, Available 24 hours/day Type of Home: House Home Access: Stairs to enter Secretary/administrator of Steps: 1 Home Layout: One level Bathroom Shower/Tub: Tub/shower unit, Health visitor: Standard Bathroom Accessibility: Yes Home Equipment: Environmental consultant - 2 wheels, Shower seat, Hand held shower head  Lives With: Daughter   Functional History: Prior Function Level of Independence: Needs assistance Gait / Transfers Assistance Needed: pt ambulated with use of RW ADL's / Homemaking Assistance Needed: required some assistance from daughter for bathing and dressing (needed help with buttons/zippers)  Functional Status:  Mobility: Bed Mobility Overal bed mobility: Needs Assistance Bed Mobility: Supine to Sit Supine to sit: Min assist Sit to supine: Min assist General bed mobility comments: Light Min A to achieve full upright sitting posture Transfers Overall transfer level: Needs assistance Equipment used: Rolling walker (2 wheeled) Transfers: Sit to/from Stand, Dispensing optician Sit to Stand: Min assist, Min guard Stand pivot transfers: Min guard General transfer comment: light Min A to power up from bedside; cueing for hand placement and sequencing Ambulation/Gait Ambulation/Gait assistance: Min assist, +2 safety/equipment Gait Distance (Feet): 50 Feet Assistive device: Rolling walker (2 wheeled) Gait Pattern/deviations: Step-through pattern, Decreased stride length, Drifts right/left, Trunk flexed General Gait Details: trunk flexion wtih tendency to drift towards R - difficulty locating objects on L side of hallway; requires assist to turn RW towards L; mild unsteadiness throughout with Min A for balance and safety Gait velocity: decreased    ADL: ADL Overall ADL's : Needs assistance/impaired Eating/Feeding: Set up Grooming: Brushing hair, Wash/dry face, Sitting, Moderate assistance Grooming Details (indicate cue type and reason): items placed on the left side of Pt, cues to find. Pt was able to pick up and then required cues to perform grooming activity (did not automatically brush hair etc) Upper Body Bathing: Moderate assistance, Sitting Lower Body Bathing: Moderate assistance, Sitting/lateral leans Upper Body Dressing : Minimal assistance, Sitting Upper Body Dressing Details (indicate cue type and reason): struggled with buttons and hooks PTA Lower Body Dressing: Moderate assistance, Sitting/lateral leans Lower Body Dressing Details (indicate cue type and reason): unable to don/doff socks independently at this time Toilet Transfer: Min guard, Minimal assistance Toileting- Clothing Manipulation and Hygiene: Min guard, Minimal assistance Functional mobility during  ADLs: (deferred) General ADL Comments: requires cues for use of grooming tools, left inattention, no family present to determine baseline  Cognition: Cognition Overall Cognitive Status: Impaired/Different from baseline Arousal/Alertness: Awake/alert Orientation Level: Oriented to  person Attention: Sustained Sustained Attention: Impaired Sustained Attention Impairment: Verbal basic, Functional basic Memory: Impaired Memory Impairment: Retrieval deficit, Decreased recall of new information, Decreased short term memory Decreased Short Term Memory: Verbal basic, Functional basic Awareness: Impaired Awareness Impairment: Intellectual impairment Problem Solving: Impaired Problem Solving Impairment: Verbal basic, Functional basic Executive Function: Reasoning, Decision Making Reasoning: Impaired Reasoning Impairment: Verbal basic, Functional basic Decision Making: Impaired Decision Making Impairment: Verbal basic, Functional basic Behaviors: Restless, Perseveration Safety/Judgment: Impaired Comments: (Pt attempting to get out of chair without verbal reminders) Cognition Arousal/Alertness: Awake/alert Behavior During Therapy: Impulsive Overall Cognitive Status: Impaired/Different from baseline Area of Impairment: Memory, Following commands, Safety/judgement, Problem solving Memory: Decreased short-term memory, Decreased recall of precautions Following Commands: Follows one step commands consistently, Follows one step commands with increased time Safety/Judgement: Decreased awareness of deficits, Decreased awareness of safety Problem Solving: Difficulty sequencing, Requires verbal cues, Requires tactile cues General Comments: requires consistent verbal and tactile cueing to complete tasks successfully; once the RW was sat in front of her she immediately knew what to do with it  Physical Exam: Blood pressure 129/70, pulse 67, temperature 98.6 F (37 C), temperature source Oral, resp. rate 18, height 4\' 5"  (1.346 m), weight 40.3 kg, SpO2 94 %. Physical Exam  Constitutional: No distress.  HENT:  Head: Normocephalic.  Eyes: Pupils are equal, round, and reactive to light. EOM are normal.  Neck: Normal range of motion. No tracheal deviation present. No thyromegaly  present.  Cardiovascular: Normal rate. Exam reveals no friction rub.  No murmur heard. Respiratory: Effort normal. No respiratory distress. She has no wheezes.  GI: Soft. She exhibits no distension. There is no abdominal tenderness.  Musculoskeletal:        General: No edema.  Neurological:  Pleasant confused (HOH) elderly female in no acute distress. Makes good eye contact with examiner.She follows simple commands. Provides her name. Mild left pronator drift. Attends fairly well to left. Motor: 4/5 bilateral UE and 4- to 4/5 bilateral LE's. No sensory findings.   Skin: Skin is warm. She is not diaphoretic.  Psychiatric: She has a normal mood and affect. Her behavior is normal.    Results for orders placed or performed during the hospital encounter of 08/11/18 (from the past 48 hour(s))  Lipid panel     Status: Abnormal   Collection Time: 08/14/18  5:26 AM  Result Value Ref Range   Cholesterol 115 0 - 200 mg/dL   Triglycerides 85 <161<150 mg/dL   HDL 35 (L) >09>40 mg/dL   Total CHOL/HDL Ratio 3.3 RATIO   VLDL 17 0 - 40 mg/dL   LDL Cholesterol 63 0 - 99 mg/dL    Comment:        Total Cholesterol/HDL:CHD Risk Coronary Heart Disease Risk Table                     Men   Women  1/2 Average Risk   3.4   3.3  Average Risk       5.0   4.4  2 X Average Risk   9.6   7.1  3 X Average Risk  23.4   11.0        Use the calculated Patient Ratio above and the CHD Risk Table to determine the patient's CHD  Risk.        ATP III CLASSIFICATION (LDL):  <100     mg/dL   Optimal  914-782  mg/dL   Near or Above                    Optimal  130-159  mg/dL   Borderline  956-213  mg/dL   High  >086     mg/dL   Very High Performed at [facility], [address] [place], [place] [address]    No results found.     Medical Problem List and Plan: 1.  Left side weakness secondary to right posterior MCA territory infarction as well as history of thrombotic stroke involving right MCA artery  2016.  -admit to inpatient rehab  -Hard of hearing 2.  DVT Prophylaxis/Anticoagulation: subcutaneous Lovenox. Monitor for any bleeding episodes 3. Pain Management:  Tylenol as needed 4. Mood:  Xanax 0.25 mg daily at bedtime 5. Neuropsych: This patient is capable of making decisions on her own behalf. 6. Skin/Wound Care:  Routine skin checks 7. Fluids/Electrolytes/Nutrition:  Routine in and out's with follow-up chemistries  -encourage PO 8. Hypertension. Lopressor 50 mg twice a day 9. Hyperlipidemia. Lipitor 10. Hypothyroidism. Synthroid 11. Constipation. Laxative assistance    Post Admission Physician Evaluation: 1. Functional deficits secondary  to right MCA infarct. 2. Patient is admitted to receive collaborative, interdisciplinary care between the physiatrist, rehab nursing staff, and therapy team. 3. Patient's level of medical complexity and substantial therapy needs in context of that medical necessity cannot be provided at a lesser intensity of care such as a SNF. 4. Patient has experienced substantial functional loss from his/her baseline which was documented above under the "Functional History" and "Functional Status" headings.  Judging by the patient's diagnosis, physical exam, and functional history, the patient has potential for functional progress which will result in measurable gains while on inpatient rehab.  These gains will be of substantial and practical use upon discharge  in facilitating mobility and Matte-care at the household level. 5. Physiatrist will provide 24 hour management of medical needs as well as oversight of the therapy plan/treatment and provide guidance as appropriate regarding the interaction of the two. 6. The Preadmission Screening has been reviewed and patient status is unchanged unless otherwise stated above. 7. 24 hour rehab nursing will assist with bladder management, bowel management, safety, skin/wound care, disease management, medication  administration, pain management and patient education  and help integrate therapy concepts, techniques,education, etc. 8. PT will assess and treat for/with: Lower extremity strength, range of motion, stamina, balance, functional mobility, safety, adaptive techniques and equipment, NMR, visual-spatial awareness, family ed.   Goals are: supervision. 9. OT will assess and treat for/with: ADL's, functional mobility, safety, upper extremity strength, adaptive techniques and equipment, NMR, visual-spatial awareness, family ed.   Goals are: supervision. Therapy may proceed with showering this patient. 10. SLP will assess and treat for/with: n/a.  Goals are: n/a. 11. Case Management and Social Worker will assess and treat for psychological issues and discharge planning. 12. Team conference will be held weekly to assess progress toward goals and to determine barriers to discharge. 13. Patient will receive at least 3 hours of therapy per day at least 5 days per week. 14. ELOS: 5-8 days       15. Prognosis:  excellent   I have personally performed a face to face diagnostic evaluation of this patient and formulated the key components of the plan.  Additionally, I  have personally reviewed laboratory data, imaging studies, as well as relevant notes and concur with the physician assistant's documentation above.  Ranelle Oyster, MD, FAAPMR    Mcarthur Rossetti Angiulli, PA-C 08/15/2018

## 2018-08-14 NOTE — Progress Notes (Signed)
PROGRESS NOTE    Leah Figueroa  HYQ:[id number] DOB: 1923-06-03 DOA: 08/11/2018 PCP: Merlene LaughterStoneking, Hal, MD   Brief Narrative: Patient is a 84 year old female with past medical history of hypertension, coronary disease, history of right MCA stroke, hypothyroidism, dementia who was admitted on 08/11/2018 when she presented from home with complaints of left-sided weakness.  Found to have subacute CVA.  Neurology following.  Plan is to discharge her to CIR when she is ready.  Assessment & Plan:   Principal Problem:   Acute right MCA stroke [facility](HCC) Active Problems:   Hypothyroidism   Essential hypertension   Coronary artery disease   Abdominal pain   Cerebrovascular accident (CVA) (HCC)   Diastolic dysfunction   History of CVA (cerebrovascular accident)   Dementia without behavioral disturbance (HCC)   Tachypnea  Acute to subacute right MCA ischemic infarct : MRI showed acute right posterior MCA territory infarct without hemorrhage or mass-effect.  She has chronic right posterior MCA infarct as well as cerebral infarct.  MRA did not show any large vessel occlusion.  She presented with left-sided weakness. Echocardiogram showed preserved ejection fraction.  Carotid Doppler did not show any significant carotid artery stenosis. She was on Plavix at home.  Currently she is on aspirin.  She might likely need dual antiplatelet regimen, will defer to neurology. PT/OT evaluation done.  Recommended CIR. CIR was consulted who recommends SNF. Continue statin at current dose for now.  Hypertension:  Blood pressure at present significantly well controlled. Continuing Lopressor 50 mg twice daily. Holding other home medications for allowing permissive hypertension.    Paroxysmal A. fib with RVR. Wide-complex tachycardia, sustained V. tach with pulse. Patient initially had some sinus tachycardia which converted into A. fib with RVR. From occasional PVCs patient started exhibiting frequent wide-complex  tachycardia although initially suspected to be A. fib with aberrancy, it appears to be a V. Tach. Heart rate 200.  Echocardiogram shows preserved EF. Potassium and magnesium was low which was replaced. Patient was given IV Lopressor with which the pt converted to sinus rhythm  Continue lopressor and IV lopressor PRN.  Patient was deemed not a candidate for anticoagulation in the past.  We will follow-up with PCP outpatient.  Hypothyroidism: Continue Synthyroid  Abdomen pain: Currently resolved.  Continue MiraLAX.  KUB did not show any bowel obstruction.  Dementia: Patient is alert but not oriented.  Continue supportive care.  She is a DNR.  Debility/deconditioning: Patient evaluated by PT and recommended CIR, CIR recommended SNF.  Social worker consulted.  DVT prophylaxis: Lovenox Code Status: Full code Family Communication: None present at the bedside Disposition Plan: Likely CIR after neurology clearance   Consultants: Neurology  Procedures: MRI  Antimicrobials: None  Subjective: Patient seen and examined at the bedside this morning.  Found to be mildly hypertensive.  Looks comfortable.  Denies any chest pain, shortness of breath, headache.  She is quite demented.  Very hard on hearing.  Rarely follows commands.  Objective: Vitals:   08/14/18 0415 08/14/18 0717 08/14/18 1145 08/14/18 1612  BP: 108/61 (!) 149/78 103/80 101/60  Pulse: 62 68 89 63  Resp: 20 (!) 22 17 (!) 21  Temp: 98.2 F (36.8 C) 98.4 F (36.9 C) 98.1 F (36.7 C) 97.8 F (36.6 C)  TempSrc: Oral Axillary Oral Oral  SpO2: 95% 94% 94% 95%  Weight:      Height:        Intake/Output Summary (Last 24 hours) at 08/14/2018 1743 Last data filed at 08/14/2018 1308  Gross per 24 hour  Intake 395 ml  Output 1200 ml  Net -805 ml   Filed Weights   08/12/18 0005  Weight: 40.3 kg    Examination:  General exam: Appears calm and comfortable ,Not in distress,thin elderly female  HEENT:PERRL,Oral mucosa  moist, Ear/Nose normal on gross exam Respiratory system: Bilateral equal air entry, normal vesicular breath sounds, no wheezes or crackles  Cardiovascular system: S1 & S2 heard, RRR. No JVD, murmurs, rubs, gallops or clicks. No pedal edema. Gastrointestinal system: Abdomen is nondistended, soft and nontender. No organomegaly or masses felt. Normal bowel sounds heard. Central nervous system: Alert but not oriented.  Neurological examination difficult because she does not follow commands.  Apparently looks weak on the left side with power of  3+/5 on the upper and lower extremities  extremities: No edema, no clubbing ,no cyanosis, distal peripheral pulses palpable. Skin: No rashes, lesions or ulcers,no icterus ,no pallor,senile purpura Psychiatry: Judgement and insight appear impaired   Data Reviewed: I have personally reviewed following labs and imaging studies  CBC: Recent Labs  Lab 08/07/18 2113 08/11/18 1826 08/12/18 1435 08/13/18 0537  WBC 6.4 8.2 9.6 9.9  NEUTROABS 4.3  --  7.7 7.5  HGB 12.3 14.0 13.0 14.1  HCT 38.2 42.1 39.8 42.1  MCV 97.4 95.7 92.3 91.5  PLT 245 243 248 257   Basic Metabolic Panel: Recent Labs  Lab 08/07/18 2113 08/11/18 1826 08/12/18 1435 08/13/18 0202  NA 140 140 140 137  K 4.0 3.8 3.4* 3.8  CL 106 104 106 105  CO2 23 22 19* 24  GLUCOSE 183* 133* 111* 103*  BUN 13 13 6* 6*  CREATININE 0.73 0.59 0.61 0.41*  CALCIUM 8.9 9.1 9.1 8.9  MG  --   --  1.6* 2.0   GFR: Estimated Creatinine Clearance: 23.4 mL/min (A) (by C-G formula based on SCr of 0.41 mg/dL (L)). Liver Function Tests: Recent Labs  Lab 08/07/18 2113 08/11/18 1826 08/12/18 1435  AST 28 28 26   ALT 16 16 16   ALKPHOS 59 63 57  BILITOT 0.6 1.5* 1.2  PROT 6.7 7.8 6.8  ALBUMIN 3.6 4.1 3.6   No results for input(s): LIPASE, AMYLASE in the last 168 hours. No results for input(s): AMMONIA in the last 168 hours. Coagulation Profile: No results for input(s): INR, PROTIME in the last 168  hours. Cardiac Enzymes: Recent Labs  Lab 08/12/18 1435 08/12/18 2001 08/13/18 0202  TROPONINI <0.03 <0.03 <0.03   BNP (last 3 results) No results for input(s): PROBNP in the last 8760 hours. HbA1C: Recent Labs    08/13/18 0537  HGBA1C 4.8   CBG: No results for input(s): GLUCAP in the last 168 hours. Lipid Profile: Recent Labs    08/14/18 0526  CHOL 115  HDL 35*  LDLCALC 63  TRIG 85  CHOLHDL 3.3   Thyroid Function Tests: Recent Labs    08/12/18 1435  TSH 0.525  FREET4 1.42   Anemia Panel: No results for input(s): VITAMINB12, FOLATE, FERRITIN, TIBC, IRON, RETICCTPCT in the last 72 hours. Sepsis Labs: No results for input(s): PROCALCITON, LATICACIDVEN in the last 168 hours.  Recent Results (from the past 240 hour(s))  Urine culture     Status: Abnormal   Collection Time: 08/07/18  3:44 AM  Result Value Ref Range Status   Specimen Description URINE, RANDOM  Final   Special Requests NONE  Final   Culture (A)  Final    <10,000 COLONIES/mL INSIGNIFICANT GROWTH Performed at [facility], [address] [place], [place] [address]    Report Status 08/09/2018 FINAL  Final         Radiology Studies: No results found.      Scheduled Meds: . ALPRAZolam  0.25 mg Oral QHS  . aspirin  300 mg Rectal Daily   Or  . aspirin  325 mg Oral Daily  . atorvastatin  40 mg Oral QHS  . enoxaparin (LOVENOX) injection  30 mg Subcutaneous Q24H  . feeding supplement (ENSURE ENLIVE)  237 mL Oral BID BM  . levothyroxine  25 mcg Oral Q0600  . metoprolol tartrate  50 mg Oral BID   Continuous Infusions:   LOS: 3 days    Time spent: 35 mins.More than 50% of that time was spent in counseling and/or coordination of care.     Author:  Lynden Oxford, MD [facility] Pager: [phone] 08/14/2018 5:46 PM     If 7PM-7AM, please contact night-coverage [email or web address] Password [facility] 08/14/2018, 5:43 PM

## 2018-08-14 NOTE — Consult Note (Signed)
[facility] CM Primary Care Navigator  08/14/2018  Leah Figueroa March 20, 1923 [id number]   Met with patient and daughter Leah Figueroa) atthe bedsideto identify possible discharge needs.  Daughter reports that patient"was acting funny/ different from usual, has nausea/ vomiting, dizziness, with left sided-weakness and visual distortion" that resulted to this admission. 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 Lajean Manes with [facility] at [facility] primary care provider.   [place] on [employer] and [facility] Mail Order service to obtain medications without difficulty.  Patient's daughter verbalized that she hasbeenmanaging medications for patientwith use of "pill box" system filled once a week.  Daughter has been driving and providing transportation to her doctors' appointments.  Daughter stays with patient at home all day and serves as the primary caregiver for her. Patient also has a CNA that stays with her at nights.  Anticipated plan for discharge [place] per therapy recommendation.  Patient's daughtervoiced understandingto callprimary care provider's office whenpatientreturns backhomefor a post discharge follow-upvisit within1- 2 weeksor sooner if needs arise.Patient letter (with PCP's contact number) was provided as areminder.   Explained topatient/ daughterabout[facility] CM services available for health management andresourcesat homebut denied any current needs or concerns for now. Patient's daughter verbalizedunderstanding todiscuss with primary care provider onpatient's next visit,aboutfurther needsand assistance inmanaging current healthconditions once patient gets back home. Patient's daughterexpressedunderstandingto seekreferral from primary care provider to [facility] care management asdeemed  necessary and appropriatefor servicesin the nearfuture- when patient returns tohome.   [facility] care management information was provided for future needs thatpatientmay have.  Primary care provider's office is listed as providing transition of care (TOC) follow-up.  Noted EMMI stroke calls in place to follow-up patient's recovery and daughter was made aware.    For additional questions please contact:  Edwena Felty A. Chanette Demo, BSN, RN-BC [facility] PRIMARY CARE Navigator Cell: [phone]

## 2018-08-14 NOTE — Care Management Important Message (Signed)
Important Message  Patient Details  Name: Leah Figueroa MRN: [id number] Date of Birth: [birth year]   Medicare Important Message Given:  Yes    [name] 08/14/2018, 4:00 PM

## 2018-08-14 NOTE — Consult Note (Signed)
Physical Medicine and Rehabilitation Consult Reason for Consult: Left side weakness Referring Physician: [facility]   HPI: Leah Figueroa is a 84 y.o.right handed female with history of hypertension, CAD, right MCA infarction in the past maintained on Plavix, dementia. Per chart review and daughter, patient lives with daughter. One level home with one step to entry. She also has a personal care attendant. Patient ambulated with the use of a rolling walker prior to admission. Required some assistance for bathing and dressing. Presented 08/12/2018 with increasing left side weakness. CT of the head reviewed, showing right MCA infarct.  MRI/MRA follow-up notes acute ischemic right posterior MCA territory infarction. No associated hemorrhage. Multiple chronic microhemorrhages seen scattered throughout the cerebellum. No large vessel occlusion. Echocardiogram with ejection fraction of 60% grade 2 diastolic dysfunction. Carotid Dopplers in no ICA stenosis. Currently maintained on aspirin for CVA prophylaxis. Subcutaneous Lovenox for DVT prophylaxis. Tolerating a regular diet. Therapy evaluation completed with recommendations of physical medicine rehabilitation consult.  Review of Systems  Constitutional: Negative for chills and fever.  HENT: Positive for hearing loss.   Eyes: Negative for blurred vision and double vision.  Respiratory: Negative for cough and shortness of breath.   Cardiovascular: Negative for chest pain and palpitations.  Gastrointestinal: Positive for constipation. Negative for nausea and vomiting.  Genitourinary: Negative for dysuria, flank pain and hematuria.  Musculoskeletal: Positive for joint pain and myalgias.  Skin: Negative for rash.  Neurological: Positive for focal weakness. Negative for sensory change.  Psychiatric/Behavioral: Positive for memory loss.  All other systems reviewed and are negative.  Past Medical History:  Diagnosis Date  . Acute myocardial infarction of  other anterior wall, episode of care unspecified   . Arthritis   . Coronary artery disease   . Other and unspecified hyperlipidemia   . Personal history of arthritis   . Personal history of malignant neoplasm of breast    s/p lumpectomy  . Stroke [facility](HCC) 03/2015   "left sided issues"  . Unspecified essential hypertension   . Unspecified hypothyroidism    Past Surgical History:  Procedure Laterality Date  . BACK SURGERY    . CATARACT EXTRACTION     left-2006, right 2011  . CHOLECYSTECTOMY    . intrathoracic goiter resection    . lumpectomy for breast cancer Left 80's   radiation therapy  . right thyroid lobectomy     Hx Goiter   Family History  Problem Relation Age of Onset  . Colon cancer Mother   . Breast cancer Mother   . Coronary artery disease Brother   . Dementia Sister    Social History:  reports that she has never smoked. She has never used smokeless tobacco. She reports that she does not drink alcohol or use drugs. Allergies: No Known Allergies Medications Prior to Admission  Medication Sig Dispense Refill  . ALPRAZolam (XANAX) 0.25 MG tablet Take 0.25 mg by mouth every morning.   0  . amLODipine (NORVASC) 2.5 MG tablet Take 7.5 mg by mouth daily.   6  . atorvastatin (LIPITOR) 10 MG tablet Take 10 mg by mouth at bedtime.     . clopidogrel (PLAVIX) 75 MG tablet Take 1 tablet (75 mg total) by mouth daily.    Marland Kitchen. levothyroxine (SYNTHROID, LEVOTHROID) 25 MCG tablet Take 1 tablet by mouth  daily (Patient taking differently: Take 25 mcg by mouth daily. ) 90 tablet 0  . Melatonin 10 MG TABS Take 10 mg by mouth at bedtime.     .Marland Kitchen  metoprolol (LOPRESSOR) 50 MG tablet Take 50 mg by mouth 2 (two) times daily.  3  . acetaminophen (TYLENOL) 325 MG tablet Take 2 tablets (650 mg total) by mouth every 4 (four) hours as needed for mild pain. (Patient not taking: Reported on 08/07/2018)    . bisacodyl (DULCOLAX) 5 MG EC tablet Take 1 tablet (5 mg total) by mouth daily as needed for moderate  constipation. (Patient not taking: Reported on 08/07/2018) 30 tablet 0  . meclizine (ANTIVERT) 25 MG tablet Take 1 tablet (25 mg total) by mouth 3 (three) times daily. (Patient not taking: Reported on 08/07/2018) 30 tablet 0  . ondansetron (ZOFRAN) 8 MG tablet Take 1 tablet (8 mg total) by mouth every 8 (eight) hours as needed for nausea or vomiting. (Patient not taking: Reported on 08/07/2018) 10 tablet 0  . polyethylene glycol (MIRALAX / GLYCOLAX) packet Take 17 g by mouth at bedtime. (Patient not taking: Reported on 08/07/2018) 14 each 0    Home: Home Living Family/patient expects to be discharged to:: Private residence Living Arrangements: Children(daughter) Available Help at Discharge: Family, Personal care attendant, Available 24 hours/day Type of Home: House Home Access: Stairs to enter Secretary/administrator of Steps: 1 Home Layout: One level Bathroom Shower/Tub: Tub/shower unit, Health visitor: Standard Bathroom Accessibility: Yes Home Equipment: Environmental consultant - 2 wheels, Shower seat, Hand held shower head  Lives With: Daughter  Functional History: Prior Function Level of Independence: Needs assistance Gait / Transfers Assistance Needed: pt ambulated with use of RW ADL's / Homemaking Assistance Needed: required some assistance from daughter for bathing and dressing (needed help with buttons/zippers) Functional Status:  Mobility: Bed Mobility Overal bed mobility: Needs Assistance Bed Mobility: Supine to Sit, Sit to Supine Supine to sit: Min guard Sit to supine: Min assist General bed mobility comments: min guard for safety, min A for BLE back into bed Transfers Overall transfer level: Needs assistance Equipment used: 1 person hand held assist Transfers: Sit to/from Stand, [employer] Transfers Sit to Stand: Min guard Stand pivot transfers: Min assist General transfer comment: min guard for safety with transition into standing from EOB; min A for stability with  pivot to recliner chair towards pt's R side. Limited as pt's HR increasing to as high as 160 bpm - RN in room and aware Ambulation/Gait General Gait Details: deferred secondary to tachycardia (HR increasing to as high as 160 bpm with sitting EOB and transfers)    ADL: ADL Overall ADL's : Needs assistance/impaired Eating/Feeding: Set up Grooming: Brushing hair, Wash/dry face, Sitting, Moderate assistance Grooming Details (indicate cue type and reason): items placed on the left side of Pt, cues to find. Pt was able to pick up and then required cues to perform grooming activity (did not automatically brush hair etc) Upper Body Bathing: Moderate assistance, Sitting Lower Body Bathing: Moderate assistance, Sitting/lateral leans Upper Body Dressing : Minimal assistance, Sitting Upper Body Dressing Details (indicate cue type and reason): struggled with buttons and hooks PTA Lower Body Dressing: Moderate assistance, Sitting/lateral leans Lower Body Dressing Details (indicate cue type and reason): unable to don/doff socks independently at this time Toilet Transfer: Min guard, Minimal assistance Toileting- Clothing Manipulation and Hygiene: Min guard, Minimal assistance Functional mobility during ADLs: (deferred) General ADL Comments: requires cues for use of grooming tools, left inattention, no family present to determine baseline  Cognition: Cognition Overall Cognitive Status: Impaired/Different from baseline Arousal/Alertness: Awake/alert Orientation Level: Oriented X4 Attention: Sustained Sustained Attention: Impaired Sustained Attention Impairment: Verbal basic, Functional  basic Memory: Impaired Memory Impairment: Retrieval deficit, Decreased recall of new information, Decreased short term memory Decreased Short Term Memory: Verbal basic, Functional basic Awareness: Impaired Awareness Impairment: Intellectual impairment Problem Solving: Impaired Problem Solving Impairment: Verbal  basic, Functional basic Executive Function: Reasoning, Decision Making Reasoning: Impaired Reasoning Impairment: Verbal basic, Functional basic Decision Making: Impaired Decision Making Impairment: Verbal basic, Functional basic Behaviors: Restless, Perseveration Safety/Judgment: Impaired Comments: (Pt attempting to get out of chair without verbal reminders) Cognition Arousal/Alertness: Awake/alert Behavior During Therapy: Impulsive Overall Cognitive Status: Impaired/Different from baseline Area of Impairment: Memory, Following commands, Safety/judgement, Problem solving Memory: Decreased short-term memory, Decreased recall of precautions Following Commands: Follows one step commands with increased time, Follows one step commands consistently Safety/Judgement: Decreased awareness of deficits, Decreased awareness of safety Problem Solving: Difficulty sequencing, Requires verbal cues, Requires tactile cues General Comments: Pt very pleasant but did not always respond to questions appropriately, did not recall why she was in the hospital but could tell me she was in the hospital. She did perseverate on topics   Blood pressure 108/61, pulse 62, temperature 98.2 F (36.8 C), temperature source Oral, resp. rate 20, height 4\' 5"  (1.346 m), weight 40.3 kg, SpO2 95 %. Physical Exam  Vitals reviewed. Constitutional: She appears well-developed.  Frail  HENT:  Head: Normocephalic and atraumatic.  Eyes: EOM are normal. Right eye exhibits no discharge. Left eye exhibits no discharge.  Neck: Normal range of motion. Neck supple. No thyromegaly present.  Cardiovascular: Normal rate and regular rhythm.  Cardiac rate controlled  Respiratory: Effort normal and breath sounds normal. No respiratory distress.  GI: Soft. Bowel sounds are normal. She exhibits no distension.  Musculoskeletal:     Comments: No edema or tenderness in extremities  Neurological: She is alert.  Patient is alert.  She is very  hard of hearing.  Follow simple commands.  She provides her name and age. Left gaze preference, able to turn to right with cues.   Motor: RUE: 4+/5 proximal to distal LUE: 4/5 proximal to distal RLE: 4+/5 proximal to distal LLE: 4+/5 proximal to distal Sensation intact to light touch  Skin: Skin is warm and dry.  Psychiatric: Her affect is blunt. Her speech is delayed. She is slowed.  Distracted    No results found for this or any previous visit (from the past 24 hour(s)). Vas US Carotid (at [facility])  Result Date: 08/13/2018 Carotid Arterial Duplex Study Indications:       CVA and Weakness. Risk Factors:      Hypertension, coronary artery disease. Other Factors:     Dementia. Comparison Study:  Prior study on file from 05/03/17 for comparison Performing Technologist: Sherren Kerns RVS  Examination Guidelines: A complete evaluation includes B-mode imaging, spectral Doppler, color Doppler, and power Doppler as needed of all accessible portions of each vessel. Bilateral testing is considered an integral part of a complete examination. Limited examinations for reoccurring indications may be performed as noted.  Right Carotid Findings: +----------+--------+--------+--------+--------+------------------+           PSV cm/sEDV cm/sStenosisDescribeComments           +----------+--------+--------+--------+--------+------------------+ CCA Prox  57      11                      intimal thickening +----------+--------+--------+--------+--------+------------------+ CCA Distal60      18  intimal thickening +----------+--------+--------+--------+--------+------------------+ ICA Prox  70      15                                         +----------+--------+--------+--------+--------+------------------+ ICA Distal83      18                                         +----------+--------+--------+--------+--------+------------------+ ECA       86      14                                          +----------+--------+--------+--------+--------+------------------+ +----------+--------+-------+--------+-------------------+           PSV cm/sEDV cmsDescribeArm Pressure (mmHG) +----------+--------+-------+--------+-------------------+ [id number]                                        +----------+--------+-------+--------+-------------------+ +---------+--------+--+--------+-+ VertebralPSV cm/s27EDV cm/s3 +---------+--------+--+--------+-+  Left Carotid Findings: +----------+--------+--------+--------+------------+------------------+           PSV cm/sEDV cm/sStenosisDescribe    Comments           +----------+--------+--------+--------+------------+------------------+ CCA Prox  67      18                          intimal thickening +----------+--------+--------+--------+------------+------------------+ CCA Distal75      17                          intimal thickening +----------+--------+--------+--------+------------+------------------+ ICA Prox  44      10              heterogenous                   +----------+--------+--------+--------+------------+------------------+ ICA Distal63      14                                             +----------+--------+--------+--------+------------+------------------+ ECA       94      8                                              +----------+--------+--------+--------+------------+------------------+ +----------+--------+--------+--------+-------------------+ SubclavianPSV cm/sEDV cm/sDescribeArm Pressure (mmHG) +----------+--------+--------+--------+-------------------+           121                                         +----------+--------+--------+--------+-------------------+ +---------+--------+--+--------+--+ VertebralPSV cm/s69EDV cm/s22 +---------+--------+--+--------+--+  Summary: Right Carotid: The extracranial vessels were near-normal with  only minimal wall                thickening or plaque. Left Carotid: Velocities in the left ICA are consistent with a 1-39% stenosis. Vertebrals:  Bilateral vertebral arteries demonstrate  antegrade flow. Subclavians: Normal flow hemodynamics were seen in bilateral subclavian              arteries. *See table(s) above for measurements and observations.  Electronically signed by Gretta Beganodd Early MD on 08/13/2018 at 9:04:21 AM.    Final     Assessment/Plan: Diagnosis: right posterior MCA territory infarction.  Labs and images (see above) independently reviewed.  Records reviewed and summated above.  1. Does the need for close, 24 hr/day medical supervision in concert with the patient's rehab needs make it unreasonable for this patient to be served in a less intensive setting? Potentially  2. Co-Morbidities requiring supervision/potential complications: grade 2 diastolic (monitor for signs/symptoms of fluid overload), HTN (monitor and provide prns in accordance with increased physical exertion and pain), CAD, right MCA infarction in the past, dementia, tachypnea (monitor RR and O2 Sats with increased physical exertion).  3. Due to safety, disease management, medication administration and patient education, does the patient require 24 hr/day rehab nursing? Potentially 4. Does the patient require coordinated care of a physician, rehab nurse, PT (1-2 hrs/day, 5 days/week) and OT (1-2 hrs/day, 5 days/week) to address physical and functional deficits in the context of the above medical diagnosis(es)? Potentially Addressing deficits in the following areas: balance, endurance, locomotion, strength, transferring, bathing, dressing, toileting, cognition and psychosocial support 5. Can the patient actively participate in an intensive therapy program of at least 3 hrs of therapy per day at least 5 days per week? No 6. The potential for patient to make measurable gains while on inpatient rehab is fair 7. Anticipated  functional outcomes upon discharge from inpatient rehab are supervision  with PT, supervision with OT, at baseline per daughter with SLP. 8. Estimated rehab length of stay to reach the above functional goals is: 5-8 days. 9. Anticipated D/C setting: Home 10. Anticipated post D/C treatments: HH therapy and Home excercise program 11. Overall Rehab/Functional Prognosis: good  RECOMMENDATIONS: This patient's condition is appropriate for continued rehabilitative care in the following setting: Patient unable to participate in 3 hours/day at present; otherwise doing relatively well functionally. Given age and comorbidities, at this time, recommend SNF for less intense, prolonged therapies. Patient has agreed to participate in recommended program. Potentially Note that insurance prior authorization may be required for reimbursement for recommended care.  Comment: Rehab Admissions Coordinator to follow up.   I have personally performed a face to face diagnostic evaluation, including, but not limited to relevant history and physical exam findings, of this patient and developed relevant assessment and plan.  Additionally, I have reviewed and concur with the physician assistant's documentation above.   Maryla MorrowAnkit Patel, MD, ABPMR [place]arthur Rossettianiel J Angiulli, PA-C 08/14/2018

## 2018-08-15 ENCOUNTER — Encounter (HOSPITAL_COMMUNITY): Payer: Self-pay

## 2018-08-15 ENCOUNTER — Inpatient Hospital Stay (HOSPITAL_COMMUNITY)
Admission: RE | Admit: 2018-08-15 | Discharge: 2018-08-23 | DRG: 057 | Disposition: A | Discharge status: Home Health | Payer: Medicare Other | Source: Intra-hospital | Attending: Physical Medicine & Rehabilitation | Admitting: Physical Medicine & Rehabilitation

## 2018-08-15 DIAGNOSIS — Z8249 Family history of ischemic heart disease and other diseases of the circulatory system: Secondary | ICD-10-CM | POA: Diagnosis not present

## 2018-08-15 DIAGNOSIS — F0390 Unspecified dementia without behavioral disturbance: Secondary | ICD-10-CM | POA: Diagnosis present

## 2018-08-15 DIAGNOSIS — H543 Unqualified visual loss, both eyes: Secondary | ICD-10-CM | POA: Diagnosis not present

## 2018-08-15 DIAGNOSIS — Z9049 Acquired absence of other specified parts of digestive tract: Secondary | ICD-10-CM | POA: Diagnosis not present

## 2018-08-15 DIAGNOSIS — E876 Hypokalemia: Secondary | ICD-10-CM

## 2018-08-15 DIAGNOSIS — Z800 Family history of malignant neoplasm of digestive organs: Secondary | ICD-10-CM | POA: Diagnosis not present

## 2018-08-15 DIAGNOSIS — R413 Other amnesia: Secondary | ICD-10-CM | POA: Diagnosis not present

## 2018-08-15 DIAGNOSIS — Z79890 Hormone replacement therapy (postmenopausal): Secondary | ICD-10-CM

## 2018-08-15 DIAGNOSIS — Z853 Personal history of malignant neoplasm of breast: Secondary | ICD-10-CM | POA: Diagnosis not present

## 2018-08-15 DIAGNOSIS — E8809 Other disorders of plasma-protein metabolism, not elsewhere classified: Secondary | ICD-10-CM | POA: Diagnosis present

## 2018-08-15 DIAGNOSIS — Z79899 Other long term (current) drug therapy: Secondary | ICD-10-CM | POA: Diagnosis not present

## 2018-08-15 DIAGNOSIS — Z7902 Long term (current) use of antithrombotics/antiplatelets: Secondary | ICD-10-CM

## 2018-08-15 DIAGNOSIS — Z9841 Cataract extraction status, right eye: Secondary | ICD-10-CM

## 2018-08-15 DIAGNOSIS — I69359 Hemiplegia and hemiparesis following cerebral infarction affecting unspecified side: Secondary | ICD-10-CM | POA: Diagnosis not present

## 2018-08-15 DIAGNOSIS — S61412A Laceration without foreign body of left hand, initial encounter: Secondary | ICD-10-CM | POA: Diagnosis present

## 2018-08-15 DIAGNOSIS — K5900 Constipation, unspecified: Secondary | ICD-10-CM | POA: Diagnosis present

## 2018-08-15 DIAGNOSIS — I10 Essential (primary) hypertension: Secondary | ICD-10-CM | POA: Diagnosis not present

## 2018-08-15 DIAGNOSIS — L910 Hypertrophic scar: Secondary | ICD-10-CM | POA: Diagnosis present

## 2018-08-15 DIAGNOSIS — M1990 Unspecified osteoarthritis, unspecified site: Secondary | ICD-10-CM | POA: Diagnosis present

## 2018-08-15 DIAGNOSIS — H9193 Unspecified hearing loss, bilateral: Secondary | ICD-10-CM | POA: Diagnosis not present

## 2018-08-15 DIAGNOSIS — I69354 Hemiplegia and hemiparesis following cerebral infarction affecting left non-dominant side: Secondary | ICD-10-CM | POA: Diagnosis present

## 2018-08-15 DIAGNOSIS — S80812A Abrasion, left lower leg, initial encounter: Secondary | ICD-10-CM | POA: Diagnosis present

## 2018-08-15 DIAGNOSIS — I252 Old myocardial infarction: Secondary | ICD-10-CM | POA: Diagnosis not present

## 2018-08-15 DIAGNOSIS — X58XXXA Exposure to other specified factors, initial encounter: Secondary | ICD-10-CM | POA: Diagnosis present

## 2018-08-15 DIAGNOSIS — Z923 Personal history of irradiation: Secondary | ICD-10-CM | POA: Diagnosis not present

## 2018-08-15 DIAGNOSIS — Z9842 Cataract extraction status, left eye: Secondary | ICD-10-CM | POA: Diagnosis not present

## 2018-08-15 DIAGNOSIS — R414 Neurologic neglect syndrome: Secondary | ICD-10-CM | POA: Diagnosis not present

## 2018-08-15 DIAGNOSIS — I63511 Cerebral infarction due to unspecified occlusion or stenosis of right middle cerebral artery: Secondary | ICD-10-CM | POA: Diagnosis not present

## 2018-08-15 DIAGNOSIS — E039 Hypothyroidism, unspecified: Secondary | ICD-10-CM | POA: Diagnosis present

## 2018-08-15 DIAGNOSIS — H9190 Unspecified hearing loss, unspecified ear: Secondary | ICD-10-CM | POA: Diagnosis present

## 2018-08-15 DIAGNOSIS — E46 Unspecified protein-calorie malnutrition: Secondary | ICD-10-CM | POA: Diagnosis not present

## 2018-08-15 DIAGNOSIS — R531 Weakness: Secondary | ICD-10-CM | POA: Diagnosis not present

## 2018-08-15 DIAGNOSIS — Z803 Family history of malignant neoplasm of breast: Secondary | ICD-10-CM

## 2018-08-15 DIAGNOSIS — G459 Transient cerebral ischemic attack, unspecified: Secondary | ICD-10-CM | POA: Diagnosis not present

## 2018-08-15 DIAGNOSIS — K5901 Slow transit constipation: Secondary | ICD-10-CM

## 2018-08-15 DIAGNOSIS — I2510 Atherosclerotic heart disease of native coronary artery without angina pectoris: Secondary | ICD-10-CM | POA: Diagnosis present

## 2018-08-15 MED ORDER — ASPIRIN 325 MG PO TABS: 325.0000 mg | ORAL_TABLET | Freq: Every day | ORAL | Status: AC

## 2018-08-15 MED ORDER — SENNOSIDES-DOCUSATE SODIUM 8.6-50 MG PO TABS
1.0000 | ORAL_TABLET | Freq: Every evening | ORAL | Status: DC | PRN
  Filled 2018-08-15: qty 1

## 2018-08-15 MED ORDER — SORBITOL 70 % SOLN
30.0000 mL | Freq: Every day | Status: DC | PRN
  Administered 2018-08-19: 30 mL via ORAL
  Filled 2018-08-15: qty 30

## 2018-08-15 MED ORDER — ACETAMINOPHEN 160 MG/5ML PO SOLN: 650.0000 mg | ORAL | Status: DC | PRN

## 2018-08-15 MED ORDER — ENOXAPARIN SODIUM 30 MG/0.3ML ~~LOC~~ SOLN: 30.0000 mg | SUBCUTANEOUS | Status: DC

## 2018-08-15 MED ORDER — ASPIRIN 300 MG RE SUPP
300.0000 mg | Freq: Every day | RECTAL | Status: DC
  Filled 2018-08-15: qty 1

## 2018-08-15 MED ORDER — METOPROLOL TARTRATE 50 MG PO TABS
50.0000 mg | ORAL_TABLET | Freq: Two times a day (BID) | ORAL | Status: DC
  Administered 2018-08-15 – 2018-08-23 (×16): 50 mg via ORAL
  Filled 2018-08-15 (×16): qty 1

## 2018-08-15 MED ORDER — ENSURE ENLIVE PO LIQD: 237.0000 mL | Freq: Two times a day (BID) | ORAL | Status: DC

## 2018-08-15 MED ORDER — ACETAMINOPHEN 325 MG PO TABS: 650.0000 mg | ORAL_TABLET | ORAL | Status: DC | PRN

## 2018-08-15 MED ORDER — ATORVASTATIN CALCIUM 40 MG PO TABS
40.0000 mg | ORAL_TABLET | Freq: Every day | ORAL | Status: DC
  Administered 2018-08-15 – 2018-08-22 (×8): 40 mg via ORAL
  Filled 2018-08-15 (×8): qty 1

## 2018-08-15 MED ORDER — ENSURE ENLIVE PO LIQD
237.0000 mL | Freq: Two times a day (BID) | ORAL | Status: DC
  Administered 2018-08-16 – 2018-08-22 (×9): 237 mL via ORAL
  Filled 2018-08-15 (×2): qty 237

## 2018-08-15 MED ORDER — ENOXAPARIN SODIUM 30 MG/0.3ML ~~LOC~~ SOLN
30.0000 mg | SUBCUTANEOUS | Status: DC
  Administered 2018-08-16 – 2018-08-23 (×8): 30 mg via SUBCUTANEOUS
  Filled 2018-08-15 (×8): qty 0.3

## 2018-08-15 MED ORDER — ASPIRIN 325 MG PO TABS
325.0000 mg | ORAL_TABLET | Freq: Every day | ORAL | Status: DC
  Administered 2018-08-16 – 2018-08-23 (×8): 325 mg via ORAL
  Filled 2018-08-15 (×8): qty 1

## 2018-08-15 MED ORDER — ACETAMINOPHEN 650 MG RE SUPP: 650.0000 mg | RECTAL | Status: DC | PRN

## 2018-08-15 MED ORDER — ALPRAZOLAM 0.25 MG PO TABS
0.2500 mg | ORAL_TABLET | Freq: Every day | ORAL | Status: DC
  Administered 2018-08-15 – 2018-08-22 (×8): 0.25 mg via ORAL
  Filled 2018-08-15 (×8): qty 1

## 2018-08-15 MED ORDER — LEVOTHYROXINE SODIUM 25 MCG PO TABS
25.0000 ug | ORAL_TABLET | Freq: Every day | ORAL | Status: DC
  Administered 2018-08-16 – 2018-08-23 (×8): 25 ug via ORAL
  Filled 2018-08-15 (×8): qty 1

## 2018-08-15 NOTE — Progress Notes (Signed)
Marcello FennelPatel, Ankit Anil, MD  Physician  Physical Medicine and Rehabilitation  Consult Note  Signed  Date of Service:  08/14/2018 5:40 AM       Related encounter: ED to Hosp-Admission (Current) from 08/11/2018 in [facility]      Signed      Expand All Collapse All    Show:Clear all [x] Manual[x] Template[] Copied  Added by: [x] Angiulli, Mcarthur Rossettianiel J, PA-C[x] Allena KatzPatel, Maryln GottronAnkit Anil, MD  [] Hover for details      Physical Medicine and Rehabilitation Consult Reason for Consult: Left side weakness Referring Physician: [facility]   HPI: Leah Figueroa is a 84 y.o.right handed female with history of hypertension, CAD, right MCA infarction in the past maintained on Plavix, dementia. Per chart review and daughter, patient lives with daughter. One level home with one step to entry. She also has a personal care attendant. Patient ambulated with the use of a rolling walker prior to admission. Required some assistance for bathing and dressing. Presented 08/12/2018 with increasing left side weakness. CT of the head reviewed, showing right MCA infarct.  MRI/MRA follow-up notes acute ischemic right posterior MCA territory infarction. No associated hemorrhage. Multiple chronic microhemorrhages seen scattered throughout the cerebellum. No large vessel occlusion. Echocardiogram with ejection fraction of 60% grade 2 diastolic dysfunction. Carotid Dopplers in no ICA stenosis. Currently maintained on aspirin for CVA prophylaxis. Subcutaneous Lovenox for DVT prophylaxis. Tolerating a regular diet. Therapy evaluation completed with recommendations of physical medicine rehabilitation consult.  Review of Systems  Constitutional: Negative for chills and fever.  HENT: Positive for hearing loss.   Eyes: Negative for blurred vision and double vision.  Respiratory: Negative for cough and shortness of breath.   Cardiovascular: Negative for chest pain and palpitations.  Gastrointestinal: Positive for  constipation. Negative for nausea and vomiting.  Genitourinary: Negative for dysuria, flank pain and hematuria.  Musculoskeletal: Positive for joint pain and myalgias.  Skin: Negative for rash.  Neurological: Positive for focal weakness. Negative for sensory change.  Psychiatric/Behavioral: Positive for memory loss.  All other systems reviewed and are negative.      Past Medical History:  Diagnosis Date  . Acute myocardial infarction of other anterior wall, episode of care unspecified   . Arthritis   . Coronary artery disease   . Other and unspecified hyperlipidemia   . Personal history of arthritis   . Personal history of malignant neoplasm of breast    s/p lumpectomy  . Stroke [facility](HCC) 03/2015   "left sided issues"  . Unspecified essential hypertension   . Unspecified hypothyroidism         Past Surgical History:  Procedure Laterality Date  . BACK SURGERY    . CATARACT EXTRACTION     left-2006, right 2011  . CHOLECYSTECTOMY    . intrathoracic goiter resection    . lumpectomy for breast cancer Left 80's   radiation therapy  . right thyroid lobectomy     Hx Goiter   Family History  Problem Relation Age of Onset  . Colon cancer Mother   . Breast cancer Mother   . Coronary artery disease Brother   . Dementia Sister    Social History:  reports that she has never smoked. She has never used smokeless tobacco. She reports that she does not drink alcohol or use drugs. Allergies: No Known Allergies       Medications Prior to Admission  Medication Sig Dispense Refill  . ALPRAZolam (XANAX) 0.25 MG tablet Take 0.25 mg by mouth every morning.  0  . amLODipine (NORVASC) 2.5 MG tablet Take 7.5 mg by mouth daily.   6  . atorvastatin (LIPITOR) 10 MG tablet Take 10 mg by mouth at bedtime.     . clopidogrel (PLAVIX) 75 MG tablet Take 1 tablet (75 mg total) by mouth daily.    Marland Kitchen. levothyroxine (SYNTHROID, LEVOTHROID) 25 MCG tablet Take 1 tablet by  mouth  daily (Patient taking differently: Take 25 mcg by mouth daily. ) 90 tablet 0  . Melatonin 10 MG TABS Take 10 mg by mouth at bedtime.     . metoprolol (LOPRESSOR) 50 MG tablet Take 50 mg by mouth 2 (two) times daily.  3  . acetaminophen (TYLENOL) 325 MG tablet Take 2 tablets (650 mg total) by mouth every 4 (four) hours as needed for mild pain. (Patient not taking: Reported on 08/07/2018)    . bisacodyl (DULCOLAX) 5 MG EC tablet Take 1 tablet (5 mg total) by mouth daily as needed for moderate constipation. (Patient not taking: Reported on 08/07/2018) 30 tablet 0  . meclizine (ANTIVERT) 25 MG tablet Take 1 tablet (25 mg total) by mouth 3 (three) times daily. (Patient not taking: Reported on 08/07/2018) 30 tablet 0  . ondansetron (ZOFRAN) 8 MG tablet Take 1 tablet (8 mg total) by mouth every 8 (eight) hours as needed for nausea or vomiting. (Patient not taking: Reported on 08/07/2018) 10 tablet 0  . polyethylene glycol (MIRALAX / GLYCOLAX) packet Take 17 g by mouth at bedtime. (Patient not taking: Reported on 08/07/2018) 14 each 0    Home: Home Living Family/patient expects to be discharged to:: Private residence Living Arrangements: Children(daughter) Available Help at Discharge: Family, Personal care attendant, Available 24 hours/day Type of Home: House Home Access: Stairs to enter Secretary/administratorntrance Stairs-Number of Steps: 1 Home Layout: One level Bathroom Shower/Tub: Tub/shower unit, Health visitorWalk-in shower Bathroom Toilet: Standard Bathroom Accessibility: Yes Home Equipment: Environmental consultantWalker - 2 wheels, Shower seat, Hand held shower head  Lives With: Daughter  Functional History: Prior Function Level of Independence: Needs assistance Gait / Transfers Assistance Needed: pt ambulated with use of RW ADL's / Homemaking Assistance Needed: required some assistance from daughter for bathing and dressing (needed help with buttons/zippers) Functional Status:  Mobility: Bed Mobility Overal bed mobility:  Needs Assistance Bed Mobility: Supine to Sit, Sit to Supine Supine to sit: Min guard Sit to supine: Min assist General bed mobility comments: min guard for safety, min A for BLE back into bed Transfers Overall transfer level: Needs assistance Equipment used: 1 person hand held assist Transfers: Sit to/from Stand, [employer]adark[employer] Petroleum CorporationStand Pivot Transfers Sit to Stand: Min guard Stand pivot transfers: Min assist General transfer comment: min guard for safety with transition into standing from EOB; min A for stability with pivot to recliner chair towards pt's R side. Limited as pt's HR increasing to as high as 160 bpm - RN in room and aware Ambulation/Gait General Gait Details: deferred secondary to tachycardia (HR increasing to as high as 160 bpm with sitting EOB and transfers)  ADL: ADL Overall ADL's : Needs assistance/impaired Eating/Feeding: Set up Grooming: Brushing hair, Wash/dry face, Sitting, Moderate assistance Grooming Details (indicate cue type and reason): items placed on the left side of Pt, cues to find. Pt was able to pick up and then required cues to perform grooming activity (did not automatically brush hair etc) Upper Body Bathing: Moderate assistance, Sitting Lower Body Bathing: Moderate assistance, Sitting/lateral leans Upper Body Dressing : Minimal assistance, Sitting Upper Body Dressing Details (indicate cue type  and reason): struggled with buttons and hooks PTA Lower Body Dressing: Moderate assistance, Sitting/lateral leans Lower Body Dressing Details (indicate cue type and reason): unable to don/doff socks independently at this time Toilet Transfer: Min guard, Minimal assistance Toileting- Clothing Manipulation and Hygiene: Min guard, Minimal assistance Functional mobility during ADLs: (deferred) General ADL Comments: requires cues for use of grooming tools, left inattention, no family present to determine baseline  Cognition: Cognition Overall Cognitive Status:  Impaired/Different from baseline Arousal/Alertness: Awake/alert Orientation Level: Oriented X4 Attention: Sustained Sustained Attention: Impaired Sustained Attention Impairment: Verbal basic, Functional basic Memory: Impaired Memory Impairment: Retrieval deficit, Decreased recall of new information, Decreased short term memory Decreased Short Term Memory: Verbal basic, Functional basic Awareness: Impaired Awareness Impairment: Intellectual impairment Problem Solving: Impaired Problem Solving Impairment: Verbal basic, Functional basic Executive Function: Reasoning, Decision Making Reasoning: Impaired Reasoning Impairment: Verbal basic, Functional basic Decision Making: Impaired Decision Making Impairment: Verbal basic, Functional basic Behaviors: Restless, Perseveration Safety/Judgment: Impaired Comments: (Pt attempting to get out of chair without verbal reminders) Cognition Arousal/Alertness: Awake/alert Behavior During Therapy: Impulsive Overall Cognitive Status: Impaired/Different from baseline Area of Impairment: Memory, Following commands, Safety/judgement, Problem solving Memory: Decreased short-term memory, Decreased recall of precautions Following Commands: Follows one step commands with increased time, Follows one step commands consistently Safety/Judgement: Decreased awareness of deficits, Decreased awareness of safety Problem Solving: Difficulty sequencing, Requires verbal cues, Requires tactile cues General Comments: Pt very pleasant but did not always respond to questions appropriately, did not recall why she was in the hospital but could tell me she was in the hospital. She did perseverate on topics   Blood pressure 108/61, pulse 62, temperature 98.2 F (36.8 C), temperature source Oral, resp. rate 20, height 4\' 5"  (1.346 m), weight 40.3 kg, SpO2 95 %. Physical Exam  Vitals reviewed. Constitutional: She appears well-developed.  Frail  HENT:  Head: Normocephalic  and atraumatic.  Eyes: EOM are normal. Right eye exhibits no discharge. Left eye exhibits no discharge.  Neck: Normal range of motion. Neck supple. No thyromegaly present.  Cardiovascular: Normal rate and regular rhythm.  Cardiac rate controlled  Respiratory: Effort normal and breath sounds normal. No respiratory distress.  GI: Soft. Bowel sounds are normal. She exhibits no distension.  Musculoskeletal:     Comments: No edema or tenderness in extremities  Neurological: She is alert.  Patient is alert.  She is very hard of hearing.  Follow simple commands.  She provides her name and age. Left gaze preference, able to turn to right with cues.   Motor: RUE: 4+/5 proximal to distal LUE: 4/5 proximal to distal RLE: 4+/5 proximal to distal LLE: 4+/5 proximal to distal Sensation intact to light touch  Skin: Skin is warm and dry.  Psychiatric: Her affect is blunt. Her speech is delayed. She is slowed.  Distracted    LabResultsLast24Hours  No results found for this or any previous visit (from the past 24 hour(s)).    ImagingResults(Last48hours)  Vas US Carotid (at [facility])  Result Date: 08/13/2018 Carotid Arterial Duplex Study Indications:       CVA and Weakness. Risk Factors:      Hypertension, coronary artery disease. Other Factors:     Dementia. Comparison Study:  Prior study on file from 05/03/17 for comparison Performing Technologist: Sherren Kerns RVS  Examination Guidelines: A complete evaluation includes B-mode imaging, spectral Doppler, color Doppler, and power Doppler as needed of all accessible portions of each vessel. Bilateral testing is considered an integral part of a  complete examination. Limited examinations for reoccurring indications may be performed as noted.  Right Carotid Findings: +----------+--------+--------+--------+--------+------------------+           PSV cm/sEDV cm/sStenosisDescribeComments            +----------+--------+--------+--------+--------+------------------+ CCA Prox  57      11                      intimal thickening +----------+--------+--------+--------+--------+------------------+ CCA Distal60      18                      intimal thickening +----------+--------+--------+--------+--------+------------------+ ICA Prox  70      15                                         +----------+--------+--------+--------+--------+------------------+ ICA Distal83      18                                         +----------+--------+--------+--------+--------+------------------+ ECA       86      14                                         +----------+--------+--------+--------+--------+------------------+ +----------+--------+-------+--------+-------------------+           PSV cm/sEDV cmsDescribeArm Pressure (mmHG) +----------+--------+-------+--------+-------------------+ [id number]                                        +----------+--------+-------+--------+-------------------+ +---------+--------+--+--------+-+ VertebralPSV cm/s27EDV cm/s3 +---------+--------+--+--------+-+  Left Carotid Findings: +----------+--------+--------+--------+------------+------------------+           PSV cm/sEDV cm/sStenosisDescribe    Comments           +----------+--------+--------+--------+------------+------------------+ CCA Prox  67      18                          intimal thickening +----------+--------+--------+--------+------------+------------------+ CCA Distal75      17                          intimal thickening +----------+--------+--------+--------+------------+------------------+ ICA Prox  44      10              heterogenous                   +----------+--------+--------+--------+------------+------------------+ ICA Distal63      14                                              +----------+--------+--------+--------+------------+------------------+ ECA       94      8                                              +----------+--------+--------+--------+------------+------------------+ +----------+--------+--------+--------+-------------------+ SubclavianPSV cm/sEDV cm/sDescribeArm Pressure (mmHG) +----------+--------+--------+--------+-------------------+  121                                         +----------+--------+--------+--------+-------------------+ +---------+--------+--+--------+--+ VertebralPSV cm/s69EDV cm/s22 +---------+--------+--+--------+--+  Summary: Right Carotid: The extracranial vessels were near-normal with only minimal wall                thickening or plaque. Left Carotid: Velocities in the left ICA are consistent with a 1-39% stenosis. Vertebrals:  Bilateral vertebral arteries demonstrate antegrade flow. Subclavians: Normal flow hemodynamics were seen in bilateral subclavian              arteries. *See table(s) above for measurements and observations.  Electronically signed by Gretta Began MD on 08/13/2018 at 9:04:21 AM.    Final      Assessment/Plan: Diagnosis: right posterior MCA territory infarction.  Labs and images (see above) independently reviewed.  Records reviewed and summated above.  1. Does the need for close, 24 hr/day medical supervision in concert with the patient's rehab needs make it unreasonable for this patient to be served in a less intensive setting? Potentially  2. Co-Morbidities requiring supervision/potential complications: grade 2 diastolic (monitor for signs/symptoms of fluid overload), HTN (monitor and provide prns in accordance with increased physical exertion and pain), CAD, right MCA infarction in the past, dementia, tachypnea (monitor RR and O2 Sats with increased physical exertion).  3. Due to safety, disease management, medication administration and patient education, does the patient require  24 hr/day rehab nursing? Potentially 4. Does the patient require coordinated care of a physician, rehab nurse, PT (1-2 hrs/day, 5 days/week) and OT (1-2 hrs/day, 5 days/week) to address physical and functional deficits in the context of the above medical diagnosis(es)? Potentially Addressing deficits in the following areas: balance, endurance, locomotion, strength, transferring, bathing, dressing, toileting, cognition and psychosocial support 5. Can the patient actively participate in an intensive therapy program of at least 3 hrs of therapy per day at least 5 days per week? No 6. The potential for patient to make measurable gains while on inpatient rehab is fair 7. Anticipated functional outcomes upon discharge from inpatient rehab are supervision  with PT, supervision with OT, at baseline per daughter with SLP. 8. Estimated rehab length of stay to reach the above functional goals is: 5-8 days. 9. Anticipated D/C setting: Home 10. Anticipated post D/C treatments: HH therapy and Home excercise program 11. Overall Rehab/Functional Prognosis: good  RECOMMENDATIONS: This patient's condition is appropriate for continued rehabilitative care in the following setting: Patient unable to participate in 3 hours/day at present; otherwise doing relatively well functionally. Given age and comorbidities, at this time, recommend SNF for less intense, prolonged therapies. Patient has agreed to participate in recommended program. Potentially Note that insurance prior authorization may be required for reimbursement for recommended care.  Comment: Rehab Admissions Coordinator to follow up.   I have personally performed a face to face diagnostic evaluation, including, but not limited to relevant history and physical exam findings, of this patient and developed relevant assessment and plan.  Additionally, I have reviewed and concur with the physician assistant's documentation above.   Maryla Morrow, MD,  ABPMR Mcarthur Rossetti Angiulli, PA-C 08/14/2018        Revision History                        Routing History

## 2018-08-15 NOTE — Clinical Social Work Note (Signed)
Clinical Social Work Assessment  Patient Details  Name: Leah Figueroa MRN: [id number] Date of Birth: 11/20/1922  Date of referral:  08/14/18               Reason for consult:  Facility Placement                Permission sought to share information with:  Facility Sport and exercise psychologist, Family Supports Permission granted to share information::  Yes, Verbal Permission Granted  Name::     Emergency planning/management officer::  SNF  Relationship::  Daughter  Contact Information:     Housing/Transportation Living arrangements for the past 2 months:  [place] of Information:  Medical Team, Adult Children Patient Interpreter Needed:  None Criminal Activity/Legal Involvement Pertinent to Current Situation/Hospitalization:  No - Comment as needed Significant Relationships:  Adult Children Lives with:  [place], Adult Children Do you feel safe going back to the place where you live?  Yes Need for family participation in patient care:  Yes (Comment)  Care giving concerns:  Patient from home with daughter but will need short term rehab at discharge. Patient's daughter hopeful that they will be able to keep her here for CIR, but agreeable to SNF if not.   Social Worker assessment / plan:  CSW met with patient's daughter at bedside, patient was asleep during discussion. Patient's daughter discussed how she had been doing really well with therapies this afternoon and she wants CIR to try to talk to the doctor again to see if she can stay. Patient's daughter discussed how the patient does well when she's pushed and engaged in therapies. CSW discussed back-up options, and patient's daughter agreeable to SNF, with preference for a facility that's closer to [employer].   Employment status:  Retired Forensic scientist:  Medicare PT Recommendations:  Inpatient [place] / Referral to community resources:  [place]  Patient/Family's Response to care:  Patient's daughter  hopeful for CIR but agreeable to SNF.  Patient/Family's Understanding of and Emotional Response to Diagnosis, Current Treatment, and Prognosis:  Patient's daughter aware of the patient's current deficits but hopeful that she'll get better. Patient's daughter believes that the patient could participate in the therapy and make gains before returning home.  Emotional Assessment Appearance:  Appears stated age Attitude/Demeanor/Rapport:  Unable to Assess Affect (typically observed):  Unable to Assess Orientation:  Oriented to [place] Alcohol / Substance use:  Not Applicable Psych involvement (Current and /or in the community):  No (Comment)  Discharge Needs  Concerns to be addressed:  Care Coordination Readmission within the last 30 days:  No Current discharge risk:  Physical Impairment, Dependent with Mobility Barriers to Discharge:  Continued Medical Work up   [employer], [place] 08/15/2018, 12:54 PM

## 2018-08-15 NOTE — Discharge Summary (Signed)
Physician Discharge Summary  Leah Figueroa ZOX:[id number] DOB: 1923/02/12 DOA: 08/11/2018  PCP: Merlene LaughterStoneking, Hal, MD  Admit date: 08/11/2018 Discharge date: 08/15/2018  Time spent: 45 minutes  Recommendations for Outpatient Follow-up:  Patient will be discharged to inpatient rehab- continue physical, occupational, speech therapy.  Patient will need to follow up with primary care provider within one week of discharge.  Follow up with neurology in 4 weeks. Patient should continue medications as prescribed.  Patient should follow a regular diet.   Discharge Diagnoses:  Acute to subacute right MCA ischemic infarct Hypertension Paroxysmal A. fib with RVR Wide-complex tachycardia, sustained V. tach with pulse Hypothyroidism Abdomen pain Dementia Debility/deconditioning  Discharge Condition: Stable   Diet recommendation: as above  Filed Weights   08/12/18 0005  Weight: 40.3 kg    History of present illness:  Leah Figueroa is a 84 y.o. female with medical history significant for hypertension, CAD, Hx of right MCA stroke, hypothyroidism, and dementia who presents the ED with left-sided weakness, confusion, and dizziness.  [facility] ED on 08/07/2018 for left arm weakness.  CT head at that time showed chronic moderate small vessel ischemic disease and chronic right posterior parietal and right cerebellar infarcts without acute infarct.  The case was discussed with on-call neurology and did not consider she would be a good candidate for further stroke intervention even if MRI imaging was obtained and showed an acute infarct.  The options for further evaluation or discussed with the patient's daughter who elected to return home even if she could have had a stroke or TIA.  Since then she has become increasingly weak on her left side with worsening dizziness and confusion.  She has had poor oral intake over the last 2 days.  Hospital Course:  Acute to  subacute right MCA ischemic infarct  -CT head showed chronic moderate small vessel ischemic disease  -MRI brain showed acute ischemic right posterior MCA territory infarct.  Multiple old infarcts.  Extensive bilateral cerebellar CMB's -MRA did not show any large vessel occlusion. -RA head unremarkable for large vessel occlusion  -She presented with left-sided weakness. -Echocardiogram EF 55 to 60% -Carotid Doppler did not show any significant carotid artery stenosis. -LDL63, hemoglobin A1c 4.8 -Was on Plavix at home, currently on aspirin -Neurology recommending continuing aspirin on discharge, does not feel patient is a good candidate for anticoagulant-patient will need follow-up with Dr. Pearlean BrownieSethi, neurology, to discuss further.   -Currently on Lipitor 10 mg daily. -PT/OT recommending CIR-CIR initially recommended SNF however now excepting patient  Hypertension Blood pressure at present significantly well controlled. Continuing Lopressor 50 mg twice daily. Holding other home medications for allowing permissive hypertension.    Paroxysmal A. fib with RVR/Wide-complex tachycardia, sustained V. tach with pulse -EKG showed A. fib with RVR -Currently patient is rate controlled and in sinus rhythm with occasional PVCs -Continue metoprolol -Dr. Pearlean BrownieSethi has not recommended anticoagulation in the past that she is not a good candidate.    Hypothyroidism -Continue Synthyroid  Abdomen pain -Currently resolved.   -Continue MiraLAX.   -KUB did not show any bowel obstruction.  Dementia -Patient is alert but not oriented.  Continue supportive care.  She is a DNR.  Debility/deconditioning -Patient evaluated by PT and recommended CIR, CIR   Procedures: Echocardiogram Carotid Doppler  Consultations: Neurology Inpatient rehab  Discharge Exam: Vitals:   08/15/18 0417 08/15/18 0807  BP: 129/70 110/63  Pulse: 67 70  Resp: 18 18  Temp: 98.6 F (  37 C) 98.4 F (36.9 C)  SpO2: 94% 95%      General: Well developed, well nourished, early, NAD  HEENT: NCAT, mucous membranes moist.  Cardiovascular: S1 S2 auscultated, RRR  Respiratory: Clear to auscultation bilaterally   Abdomen: Soft, nontender, nondistended, + bowel sounds  Extremities: warm dry without cyanosis clubbing or edema  Neuro: AAOx1 (Zylstra), with history of dementia, very hard of hearing  Psych: Appropriate and pleasant  Discharge Instructions Discharge Instructions    Ambulatory referral to Neurology   Complete by:  As directed    Follow up with Dr. Pearlean Brownie at [facility] in 4 weeks. Pt is Dr. Pearlean Brownie pt in the past. Also Too complicated for RN to follow. Thanks.     Allergies as of 08/15/2018   No Known Allergies     Medication List    STOP taking these medications   acetaminophen 325 MG tablet Commonly known as:  TYLENOL   amLODipine 2.5 MG tablet Commonly known as:  NORVASC   clopidogrel 75 MG tablet Commonly known as:  PLAVIX   meclizine 25 MG tablet Commonly known as:  ANTIVERT   ondansetron 8 MG tablet Commonly known as:  ZOFRAN   polyethylene glycol packet Commonly known as:  MIRALAX / GLYCOLAX     TAKE these medications   ALPRAZolam 0.25 MG tablet Commonly known as:  XANAX Take 0.25 mg by mouth every morning.   aspirin 325 MG tablet Take 1 tablet (325 mg total) by mouth daily. Start taking on:  August 16, 2018   atorvastatin 10 MG tablet Commonly known as:  LIPITOR Take 10 mg by mouth at bedtime.   bisacodyl 5 MG EC tablet Commonly known as:  DULCOLAX Take 1 tablet (5 mg total) by mouth daily as needed for moderate constipation.   feeding supplement (ENSURE ENLIVE) Liqd Take 237 mLs by mouth 2 (two) times daily between meals.   levothyroxine 25 MCG tablet Commonly known as:  SYNTHROID, LEVOTHROID Take 1 tablet by mouth  daily   Melatonin 10 MG Tabs Take 10 mg by mouth at bedtime.   metoprolol tartrate 50 MG tablet Commonly known as:  LOPRESSOR Take 50 mg by mouth  2 (two) times daily.      No Known Allergies Follow-up Information    Micki Riley, MD. Schedule an appointment as soon as possible for a visit in 4 week(s).   Specialties:  Neurology, Radiology Contact information: [address] [place] [address] [phone]        Merlene Laughter, MD. Schedule an appointment as soon as possible for a visit in 1 week(s).   Specialty:  Internal Medicine Why:  Hospital follow up Contact information: [address] [place] [address] [phone]            The results of significant diagnostics from this hospitalization (including imaging, microbiology, ancillary and laboratory) are listed below for reference.    Significant Diagnostic Studies: Dg Chest 2 View  Result Date: 08/07/2018 CLINICAL DATA:  Cough EXAM: CHEST - 2 VIEW COMPARISON:  08/17/2017 FINDINGS: There is calcific aortic atherosclerosis and bulky valvular calcification. No focal airspace consolidation or pulmonary edema. Medium-sized hiatal hernia. IMPRESSION: 1. No active cardiopulmonary disease. 2. Medium-sized hiatal hernia. Electronically Signed   By: Deatra Robinson M.D.   On: 08/07/2018 21:43   Dg Abd 1 View  Result Date: 08/11/2018 CLINICAL DATA:  Nausea. EXAM: ABDOMEN - 1 VIEW COMPARISON:  None. FINDINGS: The bowel gas pattern is  normal. Status post cholecystectomy. No radio-opaque calculi or other significant radiographic abnormality are seen. IMPRESSION: No evidence of bowel obstruction or ileus. Electronically Signed   By: Lupita Raider, M.D.   On: 08/11/2018 21:15   Ct Head Wo Contrast  Result Date: 08/11/2018 CLINICAL DATA:  Nausea and dizziness, worse with movement. EXAM: CT HEAD WITHOUT CONTRAST TECHNIQUE: Contiguous axial images were obtained from the base of the skull through the vertex without intravenous contrast. COMPARISON:  08/07/2018 FINDINGS: Brain: Interval development of right nonhemorrhagic MCA territory infarct  with loss of gray-white matter distinction involving the right temporal and parietal lobes. This is superimposed upon chronic moderate microvascular ischemic disease periventricular, deep and subcortical white matter. No hydrocephalus midline shift. Chronic remote right cerebellar infarct. Midline fourth ventricle and basal cisterns. No intra-axial mass nor extra-axial fluid. Vascular: Atherosclerosis of the carotid siphons. No hyperdense vessels noted. Skull: No acute skull fracture. No suspicious osseous lesions. Sinuses/Orbits: No acute finding. Other: None. IMPRESSION: 1. Late acute to early subacute right MCA territory infarct with loss of gray-white matter distinction involving the right temporal and parietal lobes. No hemorrhage noted. These results were called by telephone at the time of interpretation on 08/11/2018 at 7:31 pm to Dr. Margarita Grizzle , who verbally acknowledged these results. 2. Chronic moderate small vessel ischemic disease of periventricular, deep and subcortical white matter. 3. Chronic right cerebellar infarct. Electronically Signed   By: Tollie Eth M.D.   On: 08/11/2018 19:29   Ct Head Wo Contrast  Result Date: 08/07/2018 CLINICAL DATA:  Increase in confusion EXAM: CT HEAD WITHOUT CONTRAST TECHNIQUE: Contiguous axial images were obtained from the base of the skull through the vertex without intravenous contrast. COMPARISON:  05/03/2017 MRI, 917 18 head CT FINDINGS: Brain: Chronic involutional changes of the brain with sulcal and ventricular prominence. Moderate degree of chronic microvascular ischemia of the periventricular and subcortical white matter. Chronic right posterior parietal and right cerebellar infarcts. No acute intracranial hemorrhage, midline shift or edema. No acute large vascular territory infarct. No intra-axial mass nor extra-axial fluid. Midline fourth ventricle and basal cisterns. Vascular: Atherosclerosis at the skull base. No hyperdense vessel sign. Skull:  Intact Sinuses/Orbits: Bilateral lens replacements. Clear paranasal sinuses. Other: None IMPRESSION: 1. Chronic moderate small vessel ischemic disease of periventricular and subcortical white matter. 2. Chronic right posterior parietal and right cerebellar infarcts. 3. No acute intracranial abnormality. Electronically Signed   By: Tollie Eth M.D.   On: 08/07/2018 22:07   Mr Brain Wo Contrast  Result Date: 08/11/2018 CLINICAL DATA:  Initial evaluation for acute stroke. EXAM: MRI HEAD WITHOUT CONTRAST MRA HEAD WITHOUT CONTRAST TECHNIQUE: Multiplanar, multiecho pulse sequences of the brain and surrounding structures were obtained without intravenous contrast. Angiographic images of the head were obtained using MRA technique without contrast. COMPARISON:  Prior CT from earlier the same day. FINDINGS: MRI HEAD FINDINGS Brain: Generalized age-related cerebral atrophy. Confluent T2/FLAIR hyperintensity within the periventricular deep white matter both cerebral hemispheres most consistent with chronic microvascular ischemic disease, advanced in nature. Encephalomalacia with gliosis within the right parietal region consistent with remote right posterior MCA territory infarct. Multiple additional scattered remote bilateral cerebellar infarcts, right greater than left. Confluent restricted diffusion seen involving the posterior right parietal temporal and occipital lobes, consistent with acute posterior right MCA territory infarct. This is positioned immediately adjacent to the chronic right MCA territory infarct. No associated hemorrhage or mass effect. No other areas of acute or subacute infarction. Gray-white matter differentiation otherwise maintained. Multiple chronic  micro hemorrhages noted, predominantly localized to the cerebellum, most like related to poorly controlled hypertension. No mass lesion, midline shift or mass effect. No hydrocephalus. No extra-axial fluid collection. Pituitary gland within normal  limits. Vascular: Major intracranial vascular flow voids maintained. Skull and upper cervical spine: Craniocervical junction within normal limits. Visualized upper cervical spine within normal limits. Bone marrow signal intensity normal. No scalp soft tissue abnormality. Sinuses/Orbits: Patient status post bilateral ocular lens replacement. Paranasal sinuses are clear. No mastoid effusion. Inner ear structures grossly normal. Other: None. MRA HEAD FINDINGS ANTERIOR CIRCULATION: Examination degraded by motion artifact. Distal cervical segments of the internal carotid arteries are patent with symmetric antegrade flow. Petrous segments widely patent bilaterally. Cavernous/supraclinoid ICAs demonstrate mild multifocal atheromatous irregularity but are patent without hemodynamically significant stenosis. Right ICA terminus mildly ectatic. A1 segments widely patent bilaterally. Anterior communicating artery not well assessed. Anterior cerebral arteries patent to their distal aspects without proximal high-grade stenosis. M1 segments mildly irregular but are patent to their distal aspects without high-grade stenosis or occlusion. Normal MCA bifurcations. No proximal M2 occlusion. Distal right MCA branches attenuated as compared to the left, like related to chronic right MCA territory infarction. Distal small vessel atheromatous irregularity noted bilaterally. POSTERIOR CIRCULATION: Dominant left vertebral artery patent to the vertebrobasilar junction without stenosis. Right vertebral artery somewhat diminutive with multifocal atheromatous irregularity without high-grade stenosis. Left vertebral artery dominant. Left PICA patent proximally. Right PICA not well visualized. Basilar mildly tortuous with superimposed short-segment mild stenosis at its distal aspect (series 604, image 1). Superior cerebral arteries patent bilaterally. Left PCA supplied via the basilar. Right PCA supplied via the basilar as well as a prominent  right posterior communicating artery. PCAs patent to their distal aspects without flow-limiting stenosis. Distal small vessel atheromatous irregularity. No intracranial aneurysm. IMPRESSION: MRI HEAD IMPRESSION: 1. Acute ischemic right posterior MCA territory infarct as above. No associated hemorrhage or mass effect. 2. Additional chronic right posterior MCA territory infarct, with multiple chronic bilateral cerebellar infarcts, right greater than left. 3. Multiple chronic micro hemorrhages seen scattered throughout the cerebellum, most consistent with chronic poorly controlled hypertension. 4. Underlying age-related cerebral atrophy with advanced chronic microvascular ischemic disease. MRA HEAD IMPRESSION: 1. Negative intracranial MRA for large vessel occlusion. 2. Attenuation of the right MCA branches as compared to the left, likely related to the patient's chronic right MCA territory infarction. 3. Moderate small vessel atheromatous irregularity throughout the intracranial circulation. No proximal high-grade or correctable stenosis identified. Electronically Signed   By: Rise Mu M.D.   On: 08/11/2018 22:33   Dg Chest Port 1 View  Result Date: 08/11/2018 CLINICAL DATA:  Altered mental status. Weakness. Nausea and dizziness. EXAM: PORTABLE CHEST 1 VIEW COMPARISON:  08/07/2018 FINDINGS: The cardiomediastinal silhouette is unchanged with borderline cardiomegaly and extensive mitral annular calcification noted. There is aortic atherosclerosis. Lung volumes are low with mild elevation of the right hemidiaphragm. Medial left retrocardiac density corresponds to a known hiatal hernia. No definite airspace consolidation, sizeable pleural effusion, or pneumothorax is identified. Surgical clips are noted in the lower neck, left axilla, and right upper abdomen. No acute osseous abnormality is seen. IMPRESSION: Low lung volumes without evidence of acute airspace disease. Electronically Signed   By: Sebastian Ache M.D.   On: 08/11/2018 19:11   Mr Maxine Glenn Head Wo Contrast  Result Date: 08/11/2018 CLINICAL DATA:  Initial evaluation for acute stroke. EXAM: MRI HEAD WITHOUT CONTRAST MRA HEAD WITHOUT CONTRAST TECHNIQUE: Multiplanar, multiecho pulse sequences of the brain and  surrounding structures were obtained without intravenous contrast. Angiographic images of the head were obtained using MRA technique without contrast. COMPARISON:  Prior CT from earlier the same day. FINDINGS: MRI HEAD FINDINGS Brain: Generalized age-related cerebral atrophy. Confluent T2/FLAIR hyperintensity within the periventricular deep white matter both cerebral hemispheres most consistent with chronic microvascular ischemic disease, advanced in nature. Encephalomalacia with gliosis within the right parietal region consistent with remote right posterior MCA territory infarct. Multiple additional scattered remote bilateral cerebellar infarcts, right greater than left. Confluent restricted diffusion seen involving the posterior right parietal temporal and occipital lobes, consistent with acute posterior right MCA territory infarct. This is positioned immediately adjacent to the chronic right MCA territory infarct. No associated hemorrhage or mass effect. No other areas of acute or subacute infarction. Gray-white matter differentiation otherwise maintained. Multiple chronic micro hemorrhages noted, predominantly localized to the cerebellum, most like related to poorly controlled hypertension. No mass lesion, midline shift or mass effect. No hydrocephalus. No extra-axial fluid collection. Pituitary gland within normal limits. Vascular: Major intracranial vascular flow voids maintained. Skull and upper cervical spine: Craniocervical junction within normal limits. Visualized upper cervical spine within normal limits. Bone marrow signal intensity normal. No scalp soft tissue abnormality. Sinuses/Orbits: Patient status post bilateral ocular lens  replacement. Paranasal sinuses are clear. No mastoid effusion. Inner ear structures grossly normal. Other: None. MRA HEAD FINDINGS ANTERIOR CIRCULATION: Examination degraded by motion artifact. Distal cervical segments of the internal carotid arteries are patent with symmetric antegrade flow. Petrous segments widely patent bilaterally. Cavernous/supraclinoid ICAs demonstrate mild multifocal atheromatous irregularity but are patent without hemodynamically significant stenosis. Right ICA terminus mildly ectatic. A1 segments widely patent bilaterally. Anterior communicating artery not well assessed. Anterior cerebral arteries patent to their distal aspects without proximal high-grade stenosis. M1 segments mildly irregular but are patent to their distal aspects without high-grade stenosis or occlusion. Normal MCA bifurcations. No proximal M2 occlusion. Distal right MCA branches attenuated as compared to the left, like related to chronic right MCA territory infarction. Distal small vessel atheromatous irregularity noted bilaterally. POSTERIOR CIRCULATION: Dominant left vertebral artery patent to the vertebrobasilar junction without stenosis. Right vertebral artery somewhat diminutive with multifocal atheromatous irregularity without high-grade stenosis. Left vertebral artery dominant. Left PICA patent proximally. Right PICA not well visualized. Basilar mildly tortuous with superimposed short-segment mild stenosis at its distal aspect (series 604, image 1). Superior cerebral arteries patent bilaterally. Left PCA supplied via the basilar. Right PCA supplied via the basilar as well as a prominent right posterior communicating artery. PCAs patent to their distal aspects without flow-limiting stenosis. Distal small vessel atheromatous irregularity. No intracranial aneurysm. IMPRESSION: MRI HEAD IMPRESSION: 1. Acute ischemic right posterior MCA territory infarct as above. No associated hemorrhage or mass effect. 2. Additional  chronic right posterior MCA territory infarct, with multiple chronic bilateral cerebellar infarcts, right greater than left. 3. Multiple chronic micro hemorrhages seen scattered throughout the cerebellum, most consistent with chronic poorly controlled hypertension. 4. Underlying age-related cerebral atrophy with advanced chronic microvascular ischemic disease. MRA HEAD IMPRESSION: 1. Negative intracranial MRA for large vessel occlusion. 2. Attenuation of the right MCA branches as compared to the left, likely related to the patient's chronic right MCA territory infarction. 3. Moderate small vessel atheromatous irregularity throughout the intracranial circulation. No proximal high-grade or correctable stenosis identified. Electronically Signed   By: Rise Mu M.D.   On: 08/11/2018 22:33   Vas US Carotid (at [facility] Only)  Result Date: 08/13/2018 Carotid Arterial Duplex Study Indications:  CVA and Weakness. Risk Factors:      Hypertension, coronary artery disease. Other Factors:     Dementia. Comparison Study:  Prior study on file from 05/03/17 for comparison Performing Technologist: Sherren Kerns RVS  Examination Guidelines: A complete evaluation includes B-mode imaging, spectral Doppler, color Doppler, and power Doppler as needed of all accessible portions of each vessel. Bilateral testing is considered an integral part of a complete examination. Limited examinations for reoccurring indications may be performed as noted.  Right Carotid Findings: +----------+--------+--------+--------+--------+------------------+           PSV cm/sEDV cm/sStenosisDescribeComments           +----------+--------+--------+--------+--------+------------------+ CCA Prox  57      11                      intimal thickening +----------+--------+--------+--------+--------+------------------+ CCA Distal60      18                      intimal thickening  +----------+--------+--------+--------+--------+------------------+ ICA Prox  70      15                                         +----------+--------+--------+--------+--------+------------------+ ICA Distal83      18                                         +----------+--------+--------+--------+--------+------------------+ ECA       86      14                                         +----------+--------+--------+--------+--------+------------------+ +----------+--------+-------+--------+-------------------+           PSV cm/sEDV cmsDescribeArm Pressure (mmHG) +----------+--------+-------+--------+-------------------+ [id number]                                        +----------+--------+-------+--------+-------------------+ +---------+--------+--+--------+-+ VertebralPSV cm/s27EDV cm/s3 +---------+--------+--+--------+-+  Left Carotid Findings: +----------+--------+--------+--------+------------+------------------+           PSV cm/sEDV cm/sStenosisDescribe    Comments           +----------+--------+--------+--------+------------+------------------+ CCA Prox  67      18                          intimal thickening +----------+--------+--------+--------+------------+------------------+ CCA Distal75      17                          intimal thickening +----------+--------+--------+--------+------------+------------------+ ICA Prox  44      10              heterogenous                   +----------+--------+--------+--------+------------+------------------+ ICA Distal63      14                                             +----------+--------+--------+--------+------------+------------------+  ECA       94      8                                              +----------+--------+--------+--------+------------+------------------+ +----------+--------+--------+--------+-------------------+ SubclavianPSV cm/sEDV cm/sDescribeArm Pressure  (mmHG) +----------+--------+--------+--------+-------------------+           121                                         +----------+--------+--------+--------+-------------------+ +---------+--------+--+--------+--+ VertebralPSV cm/s69EDV cm/s22 +---------+--------+--+--------+--+  Summary: Right Carotid: The extracranial vessels were near-normal with only minimal wall                thickening or plaque. Left Carotid: Velocities in the left ICA are consistent with a 1-39% stenosis. Vertebrals:  Bilateral vertebral arteries demonstrate antegrade flow. Subclavians: Normal flow hemodynamics were seen in bilateral subclavian              arteries. *See table(s) above for measurements and observations.  Electronically signed by Gretta Beganodd Early MD on 08/13/2018 at 9:04:21 AM.    Final     Microbiology: Recent Results (from the past 240 hour(s))  Urine culture     Status: Abnormal   Collection Time: 08/07/18  3:44 AM  Result Value Ref Range Status   Specimen Description URINE, RANDOM  Final   Special Requests NONE  Final   Culture (A)  Final    <10,000 COLONIES/mL INSIGNIFICANT GROWTH Performed at [facility], [address] [place], [place] [address]    Report Status 08/09/2018 FINAL  Final     Labs: Basic Metabolic Panel: Recent Labs  Lab 08/11/18 1826 08/12/18 1435 08/13/18 0202  NA 140 140 137  K 3.8 3.4* 3.8  CL 104 106 105  CO2 22 19* 24  GLUCOSE 133* 111* 103*  BUN 13 6* 6*  CREATININE 0.59 0.61 0.41*  CALCIUM 9.1 9.1 8.9  MG  --  1.6* 2.0   Liver Function Tests: Recent Labs  Lab 08/11/18 1826 08/12/18 1435  AST 28 26  ALT 16 16  ALKPHOS 63 57  BILITOT 1.5* 1.2  PROT 7.8 6.8  ALBUMIN 4.1 3.6   No results for input(s): LIPASE, AMYLASE in the last 168 hours. No results for input(s): AMMONIA in the last 168 hours. CBC: Recent Labs  Lab 08/11/18 1826 08/12/18 1435 08/13/18 0537  WBC 8.2 9.6 9.9  NEUTROABS  --  7.7 7.5  HGB 14.0 13.0 14.1  HCT  42.1 39.8 42.1  MCV 95.7 92.3 91.5  PLT 243 248 257   Cardiac Enzymes: Recent Labs  Lab 08/12/18 1435 08/12/18 2001 08/13/18 0202  TROPONINI <0.03 <0.03 <0.03   BNP: BNP (last 3 results) Recent Labs    08/12/18 1435  BNP 418.9*    ProBNP (last 3 results) No results for input(s): PROBNP in the last 8760 hours.  CBG: No results for input(s): GLUCAP in the last 168 hours.     Signed:  Edsel PetrinMaryann Doloros Kwolek  [facility] 08/15/2018, 12:27 PM

## 2018-08-15 NOTE — Progress Notes (Signed)
Inpatient Rehabilitation Admissions Coordinator  I have discussed with rehab MD and will admit pt to CIR today. I met with pt and daughter at bedside and they are in agreement. I will make the arrangements to admit today. I will contact RN CM and Dr. Ree Kida to arrange.  Danne Baxter, RN, MSN Rehab Admissions Coordinator [phone] 08/15/2018 10:49 AM

## 2018-08-15 NOTE — Progress Notes (Signed)
Standley BrookingBoyette, Tyray Proch G, RN  Rehab Admission Coordinator  Physical Medicine and Rehabilitation  PMR Pre-admission  Addendum  Date of Service:  08/15/2018 11:19 AM       Related encounter: ED to Hosp-Admission (Current) [facility]         Show:Clear all [x] Manual[x] Template[x] Copied  Added by: [x] Beckie SaltsBoyette, Tye MarylandBarbara G, RN  [] Hover for details PMR Admission Coordinator Pre-Admission Assessment  Patient: Haywood FillerMabel E Fromme is an 84 y.o., female MRN: [id number] DOB: December 01, 1922 Height: 4\' 5"  (134.6 cm) Weight: 40.3 kg                                                                                                                                                  Insurance Information HMO:    PPO:      PCP:      IPA:      80/20:      OTHER: no HMO PRIMARY: Medicare a and b      Policy#: [id number]   Subscriber: pt Benefits:  Phone #: passport one online     Name: 08/14/2018 Eff. Date: 11/14/1989   Deduct: $1364      Out of Pocket Max: none      Life Max: none CIR: 100%      SNF: 20 full days Outpatient: 80%     Co-Pay: 20% Home Health: 100%      Co-Pay: none DME: 80%     Co-Pay: 20% Providers: pt choice  SECONDARY: TEFL teacherAetna senior supplement      Policy#: [id number]      Subscriber: pt  Daughter reports patient will switch insurance policies 08/16/2018 to [employer] Gold Plus HMO ID [id number]  Medicaid Application Date:       Case Manager:  Disability Application Date:       Case Worker:   Emergency Contact Information         Contact Information    Name Relation Home Work Mobile   Pegram,[name] Daughter [phone]     Pegram,[name] Relative   [phone]     Current Medical History  Patient Admitting Diagnosis: right posterior MCA territory infarction  History of Present Illness: Estrella MyrtleMabel E Jessee is a 84 year old right-handed female history of hypertension, CAD, right MCA infarction in the past maintained on Plavix and received  inpatient rehabilitation services 2016, dementia. Presented 08/12/2018 with increasing left-sided weakness. CT of the head reviewed, showing right MCA infarction. MRI/MRA follow-up notes acute ischemic right posterior MCA territory infarction no associated hemorrhage. Multiple chronic micro hemorrhage is seen scattered throughout the cerebellum. No large vessel occlusion. Echocardiogram with ejection fraction of 60% grade 2 diastolic dysfunction. Carotid Dopplers with no ICA stenosis. Maintain on aspirin for CVA prophylaxis. Subcutaneous Lovenox for DVT prophylaxis. Afib, new diagnosis. Had Afib with RVR this admit now rate controlled. Potassium and magnesium were replaced and  treated with lopressor.  Tolerating a regular diet. Patient is a DNR in acute hospital.  Patient was deemed by Dr. Pearlean BrownieSethi in the past not a good [facility], however, given current CVA, Family would like to reconsider and will discuss with Dr. Pearlean BrownieSethi at next clinic appointment.   Complete NIHSS TOTAL: 3  Past Medical History      Past Medical History:  Diagnosis Date  . Acute myocardial infarction of other anterior wall, episode of care unspecified   . Arthritis   . Coronary artery disease   . Other and unspecified hyperlipidemia   . Personal history of arthritis   . Personal history of malignant neoplasm of breast    s/p lumpectomy  . Stroke [facility](HCC) 03/2015   "left sided issues"  . Unspecified essential hypertension   . Unspecified hypothyroidism     Family History  family history includes Breast cancer in her mother; Colon cancer in her mother; Coronary artery disease in her brother; Dementia in her sister.  Prior Rehab/Hospitalizations:  Has the patient had major surgery during 100 days prior to admission?  Previous CIR 2016 and d/c'd to SNF for did not have 24/7 supervision at the time. Currently sees Dr. Wynn BankerKirsteins in outpatient.  Current Medications   Current Facility-Administered  Medications:  .  acetaminophen (TYLENOL) tablet 650 mg, 650 mg, Oral, Q4H PRN **OR** acetaminophen (TYLENOL) solution 650 mg, 650 mg, Per Tube, Q4H PRN **OR** acetaminophen (TYLENOL) suppository 650 mg, 650 mg, Rectal, Q4H PRN, Darreld McleanPatel, Vishal R, MD .  ALPRAZolam Prudy Feeler(XANAX) tablet 0.25 mg, 0.25 mg, Oral, QHS, Patel, Eston EstersVishal R, MD, 0.25 mg at 08/14/18 2022 .  aspirin suppository 300 mg, 300 mg, Rectal, Daily **OR** aspirin tablet 325 mg, 325 mg, Oral, Daily, Darreld McleanPatel, Vishal R, MD, 325 mg at 08/15/18 0910 .  atorvastatin (LIPITOR) tablet 40 mg, 40 mg, Oral, QHS, Adhikari, Amrit, MD, 40 mg at 08/14/18 2022 .  enoxaparin (LOVENOX) injection 30 mg, 30 mg, Subcutaneous, Q24H, Adhikari, Amrit, MD, 30 mg at 08/15/18 0910 .  feeding supplement (ENSURE ENLIVE) (ENSURE ENLIVE) liquid 237 mL, 237 mL, Oral, BID BM, Rolly SalterPatel, Pranav M, MD, 237 mL at 08/15/18 0909 .  levothyroxine (SYNTHROID, LEVOTHROID) tablet 25 mcg, 25 mcg, Oral, Q0600, Rolly SalterPatel, Pranav M, MD, 25 mcg at 08/15/18 0730 .  metoprolol tartrate (LOPRESSOR) injection 5 mg, 5 mg, Intravenous, Q4H PRN, Rolly SalterPatel, Pranav M, MD .  metoprolol tartrate (LOPRESSOR) tablet 50 mg, 50 mg, Oral, BID, Rolly SalterPatel, Pranav M, MD, 50 mg at 08/15/18 0910 .  senna-docusate (Senokot-S) tablet 1 tablet, 1 tablet, Oral, QHS PRN, Charlsie QuestPatel, Vishal R, MD  Patients Current Diet:     Diet Order                  Diet regular Room service appropriate? Yes; Fluid consistency: Thin  Diet effective now               Precautions / Restrictions Precautions Precautions: Fall Precaution Comments: L sided inattention Restrictions Weight Bearing Restrictions: No   Has the patient had 2 or more falls or a fall with injury in the past year?No  Prior Activity Level Limited Community (1-2x/wk): supervision level with RW pta'  Supervision with RW at home. Patient lives in her own home. Daughter provides supervision 730 am until 530 pm daily. Hired caregivers ( three) provide supervision  at night until daughter returns daily. Patient bathes in shower weekly with min assist of daughter. Patient does not like the feeling of the  water from the shower when bathing. Patient wears depends at baseline. Very HOH, does not wear hearing aids due to fit and not working well. Patient follow routines and basic instructions well. Pleasantly confused. Daughter states her cognition is at baseline since this admission.  Home Assistive Devices / Equipment Home Assistive Devices/Equipment: Dentures (specify type), Walker (specify type), Eyeglasses Home Equipment: Walker - 2 wheels, Shower seat, Hand held shower head  Prior Device Use: Indicate devices/aids used by the patient prior to current illness, exacerbation or injury? Walker  Prior Functional Level Prior Function Level of Independence: Needs assistance(supervision with RW) Gait / Transfers Assistance Needed: pt ambulated with use of RW ADL's / Homemaking Assistance Needed: required some assistance from daughter for bathing and dressing (needed help with buttons/zippers) Comments: VERY HOH; no hearing aids  Abate Care: Did the patient need help bathing, dressing, using the toilet or eating?  Needed some help  Indoor Mobility: Did the patient need assistance with walking from room to room (with or without device)? Needed some help  Stairs: Did the patient need assistance with internal or external stairs (with or without device)? Needed some help  Functional Cognition: Did the patient need help planning regular tasks such as shopping or remembering to take medications? Needed some help  Current Functional Level Cognition  Arousal/Alertness: Awake/alert Overall Cognitive Status: Impaired/Different from baseline Orientation Level: Oriented to person, Disoriented to time, Disoriented to place, Oriented to situation Following Commands: Follows one step commands consistently, Follows one step commands with increased  time Safety/Judgement: Decreased awareness of deficits, Decreased awareness of safety General Comments: requires consistent verbal and tactile cueing to complete tasks successfully; once the RW was sat in front of her she immediately knew what to do with it Attention: Sustained Sustained Attention: Impaired Sustained Attention Impairment: Verbal basic, Functional basic Memory: Impaired Memory Impairment: Retrieval deficit, Decreased recall of new information, Decreased short term memory Decreased Short Term Memory: Verbal basic, Functional basic Awareness: Impaired Awareness Impairment: Intellectual impairment Problem Solving: Impaired Problem Solving Impairment: Verbal basic, Functional basic Executive Function: Reasoning, Decision Making Reasoning: Impaired Reasoning Impairment: Verbal basic, Functional basic Decision Making: Impaired Decision Making Impairment: Verbal basic, Functional basic Behaviors: Restless, Perseveration Safety/Judgment: Impaired Comments: (Pt attempting to get out of chair without verbal reminders)    Extremity Assessment (includes Sensation/Coordination)  Upper Extremity Assessment: LUE deficits/detail, Generalized weakness LUE Deficits / Details: grossly 4/5 strength, reports sensation intact - but slow responses to questions LUE Sensation: decreased proprioception LUE Coordination: decreased fine motor, decreased gross motor(unsure of baseline as no family present)  Lower Extremity Assessment: Defer to PT evaluation    ADLs  Overall ADL's : Needs assistance/impaired Eating/Feeding: Set up Grooming: Brushing hair, Wash/dry face, Sitting, Moderate assistance Grooming Details (indicate cue type and reason): items placed on the left side of Pt, cues to find. Pt was able to pick up and then required cues to perform grooming activity (did not automatically brush hair etc) Upper Body Bathing: Moderate assistance, Sitting Lower Body Bathing: Moderate  assistance, Sitting/lateral leans Upper Body Dressing : Minimal assistance, Sitting Upper Body Dressing Details (indicate cue type and reason): struggled with buttons and hooks PTA Lower Body Dressing: Moderate assistance, Sitting/lateral leans Lower Body Dressing Details (indicate cue type and reason): unable to don/doff socks independently at this time Toilet Transfer: Min guard, Minimal assistance Toileting- Clothing Manipulation and Hygiene: Min guard, Minimal assistance Functional mobility during ADLs: (deferred) General ADL Comments: requires cues for use of grooming tools, left inattention, no family  present to determine baseline    Mobility  Overal bed mobility: Needs Assistance Bed Mobility: Supine to Sit Supine to sit: Min assist Sit to supine: Min assist General bed mobility comments: Light Min A to achieve full upright sitting posture    Transfers  Overall transfer level: Needs assistance Equipment used: Rolling walker (2 wheeled) Transfers: Sit to/from Stand, Pharmacologist Sit to Stand: Min assist, Min guard Stand pivot transfers: Min guard General transfer comment: light Min A to power up from bedside; cueing for hand placement and sequencing    Ambulation / Gait / Stairs / Wheelchair Mobility  Ambulation/Gait Ambulation/Gait assistance: Min assist, +2 safety/equipment Gait Distance (Feet): 50 Feet Assistive device: Rolling walker (2 wheeled) Gait Pattern/deviations: Step-through pattern, Decreased stride length, Drifts right/left, Trunk flexed General Gait Details: trunk flexion wtih tendency to drift towards R - difficulty locating objects on L side of hallway; requires assist to turn RW towards L; mild unsteadiness throughout with Min A for balance and safety Gait velocity: decreased    Posture / Balance Balance Overall balance assessment: Needs assistance Sitting-balance support: No upper extremity supported, Feet supported Sitting balance-[name]  Scale: Fair Standing balance support: Bilateral upper extremity supported, During functional activity Standing balance-[name] Scale: Poor Standing balance comment: reliant on external support    Special needs/care consideration BiPAP/CPAP n/a CPM n/a Continuous Drip IV n/a Dialysis n/a Life Vest n/a Oxygen n/a Special Bed n/a Trach Size n/a Wound Vac n/a Skin 08/14/2018 coccyx stage 1 with foam dressing applied Bowel mgmt: LBM 12/30; incontinent Bladder mgmt: incontinent; wears depends Diabetic mgmt n/a Very HOH ; does not wear hearing aids   Previous Home Environment Living Arrangements: (Pateitn lives in her home; daughter there during the day and)  Lives With: Alone(daughter stays at pt's home during the day and caregivers at) Available Help at Discharge: Family, Personal care attendant, Available 24 hours/day Type of Home: House Home Layout: One level Home Access: Stairs to enter Secretary/administrator of Steps: 1 Bathroom Shower/Tub: Hydrographic surveyor, Health visitor: Administrator Accessibility: Yes How Accessible: Accessible via walker Home Care Services: No Additional Comments: Has used [facility] in the past  Discharge Living Setting Plans for Discharge Living Setting: Patient's home(daughter stays with her during teh day and caregivers at nig) Type of Home at Discharge: House Discharge Home Layout: One level Discharge Home Access: Stairs to enter Entrance Stairs-Rails: None Entrance Stairs-Number of Steps: 1 Discharge Bathroom Shower/Tub: Tub/shower unit, Garment/textile technologist: Standard Discharge Bathroom Accessibility: Yes How Accessible: Accessible via walker Does the patient have any problems obtaining your medications?: No  Social/Family/Support Systems Patient Roles: Parent Contact Information: daughter Anticipated Caregiver: daughter and hired caregivers at night Anticipated Industrial/product designer  Information: see above Ability/Limitations of Caregiver: daughter goes home every day 530 pm and returns at 730 am Caregiver Availability: 24/7 Discharge Plan Discussed with Primary Caregiver: Yes Is Caregiver In Agreement with Plan?: Yes Does Caregiver/Family have Issues with Lodging/Transportation while Pt is in Rehab?: No  Daughter stays with the patient in pt's home 730 am until 5:30 pm daily. Has three hired caregivers who provide supervision when daughter not present.  Goals/Additional Needs Patient/Family Goal for Rehab: supervision with PT and OT Expected length of stay: ELOS 5 to 8 days Special Service Needs: Patietn is VERY HOH; does not wear hearing aids Pt/Family Agrees to Admission and willing to participate: Yes Program Orientation Provided & Reviewed with Pt/Caregiver Including Roles  & Responsibilities: Yes Additional Information  Needs: Patietn sees Dr. Wynn Banker in outpt for the past 3 years  Decrease burden of Care through IP rehab admission: n/a  Possible need for SNF placement upon discharge: not anticipated  Patient Condition: This patient's medical and functional status has changed since the consult dated 08/14/2018 in which the Rehabilitation Physician determined and documented that the patient was potentially appropriate for intensive rehabilitative care in an inpatient rehabilitation facility. Patient performed well with Physical Therapy on 12/30 afternoon and very alert and attentive after therapy. Issues have been addressed and update has been discussed with Dr. Riley Kill and patient now appropriate for inpatient rehabilitation. Will admit to inpatient rehab today.   Preadmission Screen Completed By:  Clois Dupes, 08/15/2018 11:19 AM ______________________________________________________________________   Discussed status with Dr. Riley Kill on 08/15/2018 at  1138 and received telephone approval for admission today.  Admission Coordinator:  Clois Dupes, time 6962 Date 08/15/2018       Cosigned by: Ranelle Oyster, MD at 08/15/2018 12:49 PM  Revision History

## 2018-08-15 NOTE — H&P (Signed)
Physical Medicine and Rehabilitation Admission H&P        Chief Complaint  Patient presents with  . Nausea  : HPI: Leah Figueroa is a 84 year old right-handed female history of hypertension, CAD, right MCA infarction in the past maintained on Plavix and received inpatient rehabilitation services 2016, dementia. Per chart review and daughter, patient lives with daughter. One level home with one step to entry. She also has a personal care attendant. Patient ambulated with the use of rolling walker prior to admission. Required some assistance for bathing and dressing. Presented 08/12/2018 with increasing left-sided weakness. CT of the head reviewed, showing right MCA infarction. MRI/MRA follow-up notes acute ischemic right posterior MCA territory infarction no associated hemorrhage. Multiple chronic microhemorrhage is seen scattered throughout the cerebellum. No large vessel occlusion. Echocardiogram with ejection fraction of 60% grade 2 diastolic dysfunction. Carotid Dopplers with no ICA stenosis. Maintain on aspirin for CVA prophylaxis. Subcutaneous Lovenox for DVT prophylaxis. Tolerating a regular diet. Therapy evaluations completed with recommendations of physical medicine rehabilitation consult. Patient was admitted for a compress rehabilitation program.   Review of Systems  Constitutional: Negative for chills and fever.  HENT: Positive for hearing loss.   Eyes: Negative for blurred vision and double vision.  Respiratory: Negative for cough and shortness of breath.   Cardiovascular: Positive for leg swelling. Negative for chest pain and palpitations.  Gastrointestinal: Positive for constipation. Negative for nausea and vomiting.  Genitourinary: Negative for dysuria, flank pain and hematuria.  Musculoskeletal: Positive for joint pain and myalgias.  Neurological: Positive for focal weakness.  Psychiatric/Behavioral: Positive for memory loss.  All other systems reviewed and are negative.       Past Medical History:  Diagnosis Date  . Acute myocardial infarction of other anterior wall, episode of care unspecified    . Arthritis    . Coronary artery disease    . Other and unspecified hyperlipidemia    . Personal history of arthritis    . Personal history of malignant neoplasm of breast      s/p lumpectomy  . Stroke [facility]) 03/2015    "left sided issues"  . Unspecified essential hypertension    . Unspecified hypothyroidism           Past Surgical History:  Procedure Laterality Date  . BACK SURGERY      . CATARACT EXTRACTION        left-2006, right 2011  . CHOLECYSTECTOMY      . intrathoracic goiter resection      . lumpectomy for breast cancer Left 80's    radiation therapy  . right thyroid lobectomy        Hx Goiter         Family History  Problem Relation Age of Onset  . Colon cancer Mother    . Breast cancer Mother    . Coronary artery disease Brother    . Dementia Sister      Social History:  reports that she has never smoked. She has never used smokeless tobacco. She reports that she does not drink alcohol or use drugs. Allergies: No Known Allergies Medications Prior to Admission  Medication Sig Dispense Refill  . ALPRAZolam (XANAX) 0.25 MG tablet Take 0.25 mg by mouth every morning.    0  . amLODipine (NORVASC) 2.5 MG tablet Take 7.5 mg by mouth daily.    6  . atorvastatin (LIPITOR) 10 MG tablet Take 10 mg by mouth at bedtime.       Marland Kitchen  clopidogrel (PLAVIX) 75 MG tablet Take 1 tablet (75 mg total) by mouth daily.      Marland Kitchen. levothyroxine (SYNTHROID, LEVOTHROID) 25 MCG tablet Take 1 tablet by mouth  daily (Patient taking differently: Take 25 mcg by mouth daily. ) 90 tablet 0  . Melatonin 10 MG TABS Take 10 mg by mouth at bedtime.       . metoprolol (LOPRESSOR) 50 MG tablet Take 50 mg by mouth 2 (two) times daily.   3  . acetaminophen (TYLENOL) 325 MG tablet Take 2 tablets (650 mg total) by mouth every 4 (four) hours as needed for mild pain. (Patient not  taking: Reported on 08/07/2018)      . bisacodyl (DULCOLAX) 5 MG EC tablet Take 1 tablet (5 mg total) by mouth daily as needed for moderate constipation. (Patient not taking: Reported on 08/07/2018) 30 tablet 0  . meclizine (ANTIVERT) 25 MG tablet Take 1 tablet (25 mg total) by mouth 3 (three) times daily. (Patient not taking: Reported on 08/07/2018) 30 tablet 0  . ondansetron (ZOFRAN) 8 MG tablet Take 1 tablet (8 mg total) by mouth every 8 (eight) hours as needed for nausea or vomiting. (Patient not taking: Reported on 08/07/2018) 10 tablet 0  . polyethylene glycol (MIRALAX / GLYCOLAX) packet Take 17 g by mouth at bedtime. (Patient not taking: Reported on 08/07/2018) 14 each 0      Drug Regimen Review Drug regimen was reviewed and remains appropriate with no significant issues identified   Home: Home Living Family/patient expects to be discharged to:: Private residence Living Arrangements: Children(daughter) Available Help at Discharge: Family, Personal care attendant, Available 24 hours/day Type of Home: House Home Access: Stairs to enter Secretary/administratorntrance Stairs-Number of Steps: 1 Home Layout: One level Bathroom Shower/Tub: Tub/shower unit, Health visitorWalk-in shower Bathroom Toilet: Standard Bathroom Accessibility: Yes Home Equipment: Environmental consultantWalker - 2 wheels, Shower seat, Hand held shower head  Lives With: Daughter   Functional History: Prior Function Level of Independence: Needs assistance Gait / Transfers Assistance Needed: pt ambulated with use of RW ADL's / Homemaking Assistance Needed: required some assistance from daughter for bathing and dressing (needed help with buttons/zippers)   Functional Status:  Mobility: Bed Mobility Overal bed mobility: Needs Assistance Bed Mobility: Supine to Sit Supine to sit: Min assist Sit to supine: Min assist General bed mobility comments: Light Min A to achieve full upright sitting posture Transfers Overall transfer level: Needs assistance Equipment used:  Rolling walker (2 wheeled) Transfers: Sit to/from Stand, Pharmacologisttand Pivot Transfers Sit to Stand: Min assist, Min guard Stand pivot transfers: Min guard General transfer comment: light Min A to power up from bedside; cueing for hand placement and sequencing Ambulation/Gait Ambulation/Gait assistance: Min assist, +2 safety/equipment Gait Distance (Feet): 50 Feet Assistive device: Rolling walker (2 wheeled) Gait Pattern/deviations: Step-through pattern, Decreased stride length, Drifts right/left, Trunk flexed General Gait Details: trunk flexion wtih tendency to drift towards R - difficulty locating objects on L side of hallway; requires assist to turn RW towards L; mild unsteadiness throughout with Min A for balance and safety Gait velocity: decreased   ADL: ADL Overall ADL's : Needs assistance/impaired Eating/Feeding: Set up Grooming: Brushing hair, Wash/dry face, Sitting, Moderate assistance Grooming Details (indicate cue type and reason): items placed on the left side of Pt, cues to find. Pt was able to pick up and then required cues to perform grooming activity (did not automatically brush hair etc) Upper Body Bathing: Moderate assistance, Sitting Lower Body Bathing: Moderate assistance, Sitting/lateral leans Upper Body  Dressing : Minimal assistance, Sitting Upper Body Dressing Details (indicate cue type and reason): struggled with buttons and hooks PTA Lower Body Dressing: Moderate assistance, Sitting/lateral leans Lower Body Dressing Details (indicate cue type and reason): unable to don/doff socks independently at this time Toilet Transfer: Min guard, Minimal assistance Toileting- Clothing Manipulation and Hygiene: Min guard, Minimal assistance Functional mobility during ADLs: (deferred) General ADL Comments: requires cues for use of grooming tools, left inattention, no family present to determine baseline   Cognition: Cognition Overall Cognitive Status: Impaired/Different from  baseline Arousal/Alertness: Awake/alert Orientation Level: Oriented to person Attention: Sustained Sustained Attention: Impaired Sustained Attention Impairment: Verbal basic, Functional basic Memory: Impaired Memory Impairment: Retrieval deficit, Decreased recall of new information, Decreased short term memory Decreased Short Term Memory: Verbal basic, Functional basic Awareness: Impaired Awareness Impairment: Intellectual impairment Problem Solving: Impaired Problem Solving Impairment: Verbal basic, Functional basic Executive Function: Reasoning, Decision Making Reasoning: Impaired Reasoning Impairment: Verbal basic, Functional basic Decision Making: Impaired Decision Making Impairment: Verbal basic, Functional basic Behaviors: Restless, Perseveration Safety/Judgment: Impaired Comments: (Pt attempting to get out of chair without verbal reminders) Cognition Arousal/Alertness: Awake/alert Behavior During Therapy: Impulsive Overall Cognitive Status: Impaired/Different from baseline Area of Impairment: Memory, Following commands, Safety/judgement, Problem solving Memory: Decreased short-term memory, Decreased recall of precautions Following Commands: Follows one step commands consistently, Follows one step commands with increased time Safety/Judgement: Decreased awareness of deficits, Decreased awareness of safety Problem Solving: Difficulty sequencing, Requires verbal cues, Requires tactile cues General Comments: requires consistent verbal and tactile cueing to complete tasks successfully; once the RW was sat in front of her she immediately knew what to do with it   Physical Exam: Blood pressure 129/70, pulse 67, temperature 98.6 F (37 C), temperature source Oral, resp. rate 18, height 4\' 5"  (1.346 m), weight 40.3 kg, SpO2 94 %. Physical Exam  Constitutional: No distress.  HENT:  Head: Normocephalic.  Eyes: Pupils are equal, round, and reactive to light. EOM are normal.  Neck:  Normal range of motion. No tracheal deviation present. No thyromegaly present.  Cardiovascular: Normal rate. Exam reveals no friction rub.  No murmur heard. Respiratory: Effort normal. No respiratory distress. She has no wheezes.  GI: Soft. She exhibits no distension. There is no abdominal tenderness.  Musculoskeletal:        General: No edema.  Neurological:  Pleasant confused (HOH) elderly female in no acute distress. Makes good eye contact with examiner.She follows simple commands. Provides her name. Mild left pronator drift. Attends fairly well to left. Motor: 4/5 bilateral UE and 4- to 4/5 bilateral LE's. No sensory findings.   Skin: Skin is warm. She is not diaphoretic.  Psychiatric: She has a normal mood and affect. Her behavior is normal.      Lab Results Last 48 Hours  Results for orders placed or performed during the hospital encounter of 08/11/18 (from the past 48 hour(s))  Lipid panel     Status: Abnormal    Collection Time: 08/14/18  5:26 AM  Result Value Ref Range    Cholesterol 115 0 - 200 mg/dL    Triglycerides 85 <027 mg/dL    HDL 35 (L) >25 mg/dL    Total CHOL/HDL Ratio 3.3 RATIO    VLDL 17 0 - 40 mg/dL    LDL Cholesterol 63 0 - 99 mg/dL      Comment:        Total Cholesterol/HDL:CHD Risk Coronary Heart Disease Risk Table  Men   Women  1/2 Average Risk   3.4   3.3  Average Risk       5.0   4.4  2 X Average Risk   9.6   7.1  3 X Average Risk  23.4   11.0        Use the calculated Patient Ratio above and the CHD Risk Table to determine the patient's CHD Risk.        ATP III CLASSIFICATION (LDL):  <100     mg/dL   Optimal  [id number]  mg/dL   Near or Above                    Optimal  130-159  mg/dL   Borderline  [id number]  mg/dL   High  >811>190     mg/dL   Very High Performed at [facility], [address] [place], [place] [address]        Imaging Results (Last 48 hours)  No results found.           Medical Problem List and  Plan: 1.  Left side weakness secondary to right posterior MCA territory infarction as well as history of thrombotic stroke involving right MCA artery 2016.             -admit to inpatient rehab             -Hard of hearing 2.  DVT Prophylaxis/Anticoagulation: subcutaneous Lovenox. Monitor for any bleeding episodes 3. Pain Management:  Tylenol as needed 4. Mood:  Xanax 0.25 mg daily at bedtime 5. Neuropsych: This patient is capable of making decisions on her own behalf. 6. Skin/Wound Care:  Routine skin checks 7. Fluids/Electrolytes/Nutrition:  Routine in and out's with follow-up chemistries             -encourage PO 8. Hypertension. Lopressor 50 mg twice a day 9. Hyperlipidemia. Lipitor 10. Hypothyroidism. Synthroid 11. Constipation. Laxative assistance       Post Admission Physician Evaluation: 1. Functional deficits secondary  to right MCA infarct. 2. Patient is admitted to receive collaborative, interdisciplinary care between the physiatrist, rehab nursing staff, and therapy team. 3. Patient's level of medical complexity and substantial therapy needs in context of that medical necessity cannot be provided at a lesser intensity of care such as a SNF. 4. Patient has experienced substantial functional loss from his/her baseline which was documented above under the "Functional History" and "Functional Status" headings.  Judging by the patient's diagnosis, physical exam, and functional history, the patient has potential for functional progress which will result in measurable gains while on inpatient rehab.  These gains will be of substantial and practical use upon discharge  in facilitating mobility and Wagoner-care at the household level. 5. Physiatrist will provide 24 hour management of medical needs as well as oversight of the therapy plan/treatment and provide guidance as appropriate regarding the interaction of the two. 6. The Preadmission Screening has been reviewed and patient status is  unchanged unless otherwise stated above. 7. 24 hour rehab nursing will assist with bladder management, bowel management, safety, skin/wound care, disease management, medication administration, pain management and patient education  and help integrate therapy concepts, techniques,education, etc. 8. PT will assess and treat for/with: Lower extremity strength, range of motion, stamina, balance, functional mobility, safety, adaptive techniques and equipment, NMR, visual-spatial awareness, family ed.   Goals are: supervision. 9. OT will assess and treat for/with: ADL's, functional mobility, safety, upper extremity strength, adaptive techniques  and equipment, NMR, visual-spatial awareness, family ed.   Goals are: supervision. Therapy may proceed with showering this patient. 10. SLP will assess and treat for/with: n/a.  Goals are: n/a. 11. Case Management and Social Worker will assess and treat for psychological issues and discharge planning. 12. Team conference will be held weekly to assess progress toward goals and to determine barriers to discharge. 13. Patient will receive at least 3 hours of therapy per day at least 5 days per week. 14. ELOS: 5-8 days       15. Prognosis:  excellent     I have personally performed a face to face diagnostic evaluation of this patient and formulated the key components of the plan.  Additionally, I have personally reviewed laboratory data, imaging studies, as well as relevant notes and concur with the physician assistant's documentation above.   Ranelle Oyster, MD, FAAPMR     Mcarthur Rossetti Angiulli, PA-C 08/15/2018  The patient's status has not changed. The original post admission physician evaluation remains appropriate, and any changes from the pre-admission screening or documentation from the acute chart are noted above.  Ranelle Oyster, MD 08/15/2018

## 2018-08-15 NOTE — Care Management Note (Signed)
Case Management Note  Patient Details  Name: Leah Figueroa MRN: [id number] Date of Birth: [birth year]  Subjective/Objective:                    Action/Plan: Pt is discharging to CIR today. CM signing off.   Expected Discharge Date:  08/15/18               Expected Discharge Plan:  IP Rehab Facility  In-House Referral:     Discharge planning Services  CM Consult  Post Acute Care Choice:    Choice offered to:     DME Arranged:    DME Agency:     HH Arranged:    HH Agency:     Status of Service:  Completed, signed off  If discussed at [employer] Meetings, dates discussed:    Additional Comments:  Kermit BaloKelli F Seana Underwood, RN 08/15/2018, 12:44 PM

## 2018-08-15 NOTE — Progress Notes (Signed)
CSW alerted that patient is discharging to CIR today, no further CSW needs.  CSW signing off.  Blenda NicelyElizabeth Tyanne Derocher, [place] Clinical Social Worker [phone]

## 2018-08-15 NOTE — PMR Pre-admission (Addendum)
PMR Admission Coordinator Pre-Admission Assessment  Patient: Leah MyrtleMabel E Altic is an 84 y.o., female MRN: [id number] DOB: 03/07/1923 Height: 4\' 5"  (134.6 cm) Weight: 40.3 kg              Insurance Information HMO:    PPO:      PCP:      IPA:      80/20:      OTHER: no HMO PRIMARY: Medicare a and b      Policy#: [id number]   Subscriber: pt Benefits:  Phone #: passport one online     Name: 08/14/2018 Eff. Date: 11/14/1989   Deduct: $1364      Out of Pocket Max: none      Life Max: none CIR: 100%      SNF: 20 full days Outpatient: 80%     Co-Pay: 20% Home Health: 100%      Co-Pay: none DME: 80%     Co-Pay: 20% Providers: pt choice  SECONDARY: TEFL teacherAetna senior supplement      Policy#: [id number]      Subscriber: pt  Daughter reports patient will switch insurance policies 08/16/2018 to [employer] Gold Plus HMO ID [id number]  Medicaid Application Date:       Case Manager:  Disability Application Date:       Case Worker:   Emergency Contact Information Contact Information    Name Relation Home Work Mobile   Pegram,[name] Daughter [phone]     Pegram,[name] Relative   [phone]     Current Medical History  Patient Admitting Diagnosis: right posterior MCA territory infarction  History of Present Illness:  Leah Figueroa is a 84 year old right-handed female history of hypertension, CAD, right MCA infarction in the past maintained on Plavix and received inpatient rehabilitation services 2016, dementia. Presented 08/12/2018 with increasing left-sided weakness. CT of the head reviewed, showing right MCA infarction. MRI/MRA follow-up notes acute ischemic right posterior MCA territory infarction no associated hemorrhage. Multiple chronic micro hemorrhage is seen scattered throughout the cerebellum. No large vessel occlusion. Echocardiogram with ejection fraction of 60% grade 2 diastolic dysfunction. Carotid Dopplers with no ICA stenosis. Maintain on aspirin for CVA prophylaxis. Subcutaneous Lovenox for DVT  prophylaxis. Afib, new diagnosis. Had Afib with RVR this admit now rate controlled. Potassium and magnesium were replaced and treated with lopressor.  Tolerating a regular diet. Patient is a DNR in acute hospital.  Patient was deemed by Dr. Pearlean BrownieSethi in the past [facility], however, given current CVA, Family would like to reconsider and will discuss with Dr. Pearlean BrownieSethi at next clinic appointment.   Complete NIHSS TOTAL: 3    Past Medical History  Past Medical History:  Diagnosis Date  . Acute myocardial infarction of other anterior wall, episode of care unspecified   . Arthritis   . Coronary artery disease   . Other and unspecified hyperlipidemia   . Personal history of arthritis   . Personal history of malignant neoplasm of breast    s/p lumpectomy  . Stroke [facility]) 03/2015   "left sided issues"  . Unspecified essential hypertension   . Unspecified hypothyroidism     Family History  family history includes Breast cancer in her mother; Colon cancer in her mother; Coronary artery disease in her brother; Dementia in her sister.  Prior Rehab/Hospitalizations:  Has the patient had major surgery during 100 days prior to admission?  Previous CIR 2016 and d/c'd to SNF for did not have 24/7 supervision at the time. Currently sees Dr.  Kirsteins in outpatient.  Current Medications   Current Facility-Administered Medications:  .  acetaminophen (TYLENOL) tablet 650 mg, 650 mg, Oral, Q4H PRN **OR** acetaminophen (TYLENOL) solution 650 mg, 650 mg, Per Tube, Q4H PRN **OR** acetaminophen (TYLENOL) suppository 650 mg, 650 mg, Rectal, Q4H PRN, Darreld Mclean R, MD .  ALPRAZolam Prudy Feeler) tablet 0.25 mg, 0.25 mg, Oral, QHS, Patel, Eston Esters R, MD, 0.25 mg at 08/14/18 2022 .  aspirin suppository 300 mg, 300 mg, Rectal, Daily **OR** aspirin tablet 325 mg, 325 mg, Oral, Daily, Darreld Mclean R, MD, 325 mg at 08/15/18 0910 .  atorvastatin (LIPITOR) tablet 40 mg, 40 mg, Oral, QHS, Adhikari, Amrit, MD, 40  mg at 08/14/18 2022 .  enoxaparin (LOVENOX) injection 30 mg, 30 mg, Subcutaneous, Q24H, Adhikari, Amrit, MD, 30 mg at 08/15/18 0910 .  feeding supplement (ENSURE ENLIVE) (ENSURE ENLIVE) liquid 237 mL, 237 mL, Oral, BID BM, Rolly Salter, MD, 237 mL at 08/15/18 0909 .  levothyroxine (SYNTHROID, LEVOTHROID) tablet 25 mcg, 25 mcg, Oral, Q0600, Rolly Salter, MD, 25 mcg at 08/15/18 0730 .  metoprolol tartrate (LOPRESSOR) injection 5 mg, 5 mg, Intravenous, Q4H PRN, Rolly Salter, MD .  metoprolol tartrate (LOPRESSOR) tablet 50 mg, 50 mg, Oral, BID, Rolly Salter, MD, 50 mg at 08/15/18 0910 .  senna-docusate (Senokot-S) tablet 1 tablet, 1 tablet, Oral, QHS PRN, Charlsie Quest, MD  Patients Current Diet:  Diet Order            Diet regular Room service appropriate? Yes; Fluid consistency: Thin  Diet effective now              Precautions / Restrictions Precautions Precautions: Fall Precaution Comments: L sided inattention Restrictions Weight Bearing Restrictions: No   Has the patient had 2 or more falls or a fall with injury in the past year?No  Prior Activity Level Limited Community (1-2x/wk): supervision level with RW pta'  Supervision with RW at home. Patient lives in her own home. Daughter provides supervision 730 am until 530 pm daily. Hired caregivers ( three) provide supervision at night until daughter returns daily. Patient bathes in shower weekly with min assist of daughter. Patient does not like the feeling of the water from the shower when bathing. Patient wears depends at baseline. Very HOH, does not wear hearing aids due to fit and not working well. Patient follow routines and basic instructions well. Pleasantly confused. Daughter states her cognition is at baseline since this admission.  Home Assistive Devices / Equipment Home Assistive Devices/Equipment: Dentures (specify type), Walker (specify type), Eyeglasses Home Equipment: Walker - 2 wheels, Shower seat, Hand  held shower head  Prior Device Use: Indicate devices/aids used by the patient prior to current illness, exacerbation or injury? Walker  Prior Functional Level Prior Function Level of Independence: Needs assistance(supervision with RW) Gait / Transfers Assistance Needed: pt ambulated with use of RW ADL's / Homemaking Assistance Needed: required some assistance from daughter for bathing and dressing (needed help with buttons/zippers) Comments: VERY HOH; no hearing aids  [place] Care: Did the patient need help bathing, dressing, using the toilet or eating?  Needed some help  Indoor Mobility: Did the patient need assistance with walking from room to room (with or without device)? Needed some help  Stairs: Did the patient need assistance with internal or external stairs (with or without device)? Needed some help  Functional Cognition: Did the patient need help planning regular tasks such as shopping or remembering to take medications? Needed some help  Current Functional Level Cognition  Arousal/Alertness: Awake/alert Overall Cognitive Status: Impaired/Different from baseline Orientation Level: Oriented to person, Disoriented to time, Disoriented to place, Oriented to situation Following Commands: Follows one step commands consistently, Follows one step commands with increased time Safety/Judgement: Decreased awareness of deficits, Decreased awareness of safety General Comments: requires consistent verbal and tactile cueing to complete tasks successfully; once the RW was sat in front of her she immediately knew what to do with it Attention: Sustained Sustained Attention: Impaired Sustained Attention Impairment: Verbal basic, Functional basic Memory: Impaired Memory Impairment: Retrieval deficit, Decreased recall of new information, Decreased short term memory Decreased Short Term Memory: Verbal basic, Functional basic Awareness: Impaired Awareness Impairment: Intellectual  impairment Problem Solving: Impaired Problem Solving Impairment: Verbal basic, Functional basic Executive Function: Reasoning, Decision Making Reasoning: Impaired Reasoning Impairment: Verbal basic, Functional basic Decision Making: Impaired Decision Making Impairment: Verbal basic, Functional basic Behaviors: Restless, Perseveration Safety/Judgment: Impaired Comments: (Pt attempting to get out of chair without verbal reminders)    Extremity Assessment (includes Sensation/Coordination)  Upper Extremity Assessment: LUE deficits/detail, Generalized weakness LUE Deficits / Details: grossly 4/5 strength, reports sensation intact - but slow responses to questions LUE Sensation: decreased proprioception LUE Coordination: decreased fine motor, decreased gross motor(unsure of baseline as no family present)  Lower Extremity Assessment: Defer to PT evaluation    ADLs  Overall ADL's : Needs assistance/impaired Eating/Feeding: Set up Grooming: Brushing hair, Wash/dry face, Sitting, Moderate assistance Grooming Details (indicate cue type and reason): items placed on the left side of Pt, cues to find. Pt was able to pick up and then required cues to perform grooming activity (did not automatically brush hair etc) Upper Body Bathing: Moderate assistance, Sitting Lower Body Bathing: Moderate assistance, Sitting/lateral leans Upper Body Dressing : Minimal assistance, Sitting Upper Body Dressing Details (indicate cue type and reason): struggled with buttons and hooks PTA Lower Body Dressing: Moderate assistance, Sitting/lateral leans Lower Body Dressing Details (indicate cue type and reason): unable to don/doff socks independently at this time Toilet Transfer: Min guard, Minimal assistance Toileting- Clothing Manipulation and Hygiene: Min guard, Minimal assistance Functional mobility during ADLs: (deferred) General ADL Comments: requires cues for use of grooming tools, left inattention, no family  present to determine baseline    Mobility  Overal bed mobility: Needs Assistance Bed Mobility: Supine to Sit Supine to sit: Min assist Sit to supine: Min assist General bed mobility comments: Light Min A to achieve full upright sitting posture    Transfers  Overall transfer level: Needs assistance Equipment used: Rolling walker (2 wheeled) Transfers: Sit to/from Stand, Pharmacologisttand Pivot Transfers Sit to Stand: Min assist, Min guard Stand pivot transfers: Min guard General transfer comment: light Min A to power up from bedside; cueing for hand placement and sequencing    Ambulation / Gait / Stairs / Psychologist, prison and probation servicesWheelchair Mobility  Ambulation/Gait Ambulation/Gait assistance: Min assist, +2 safety/equipment Gait Distance (Feet): 50 Feet Assistive device: Rolling walker (2 wheeled) Gait Pattern/deviations: Step-through pattern, Decreased stride length, Drifts right/left, Trunk flexed General Gait Details: trunk flexion wtih tendency to drift towards R - difficulty locating objects on L side of hallway; requires assist to turn RW towards L; mild unsteadiness throughout with Min A for balance and safety Gait velocity: decreased    Posture / Balance Balance Overall balance assessment: Needs assistance Sitting-balance support: No upper extremity supported, Feet supported Sitting balance-[name] Scale: Fair Standing balance support: Bilateral upper extremity supported, During functional activity Standing balance-[name] Scale: Poor Standing balance comment: reliant on external support  Special needs/care consideration BiPAP/CPAP n/a CPM n/a Continuous Drip IV n/a Dialysis n/a Life Vest n/a Oxygen n/a Special Bed n/a Trach Size n/a Wound Vac n/a Skin 08/14/2018 coccyx stage 1 with foam dressing applied Bowel mgmt: LBM 12/30; incontinent Bladder mgmt: incontinent; wears depends Diabetic mgmt n/a Very HOH ; does not wear hearing aids   Previous Home Environment Living Arrangements: (Pateitn lives  in her home; daughter there during the day and)  Lives With: Alone(daughter stays at pt's home during the day and caregivers at) Available Help at Discharge: Family, Personal care attendant, Available 24 hours/day Type of Home: House Home Layout: One level Home Access: Stairs to enter Secretary/administrator of Steps: 1 Bathroom Shower/Tub: Hydrographic surveyor, Health visitor: Administrator Accessibility: Yes How Accessible: Accessible via walker Home Care Services: No Additional Comments: Has used [facility] in the past  Discharge Living Setting Plans for Discharge Living Setting: Patient's home(daughter stays with her during teh day and caregivers at nig) Type of Home at Discharge: House Discharge Home Layout: One level Discharge Home Access: Stairs to enter Entrance Stairs-Rails: None Entrance Stairs-Number of Steps: 1 Discharge Bathroom Shower/Tub: Tub/shower unit, Garment/textile technologist: Standard Discharge Bathroom Accessibility: Yes How Accessible: Accessible via walker Does the patient have any problems obtaining your medications?: No  Social/Family/Support Systems Patient Roles: Parent Contact Information: daughter Anticipated Caregiver: daughter and hired caregivers at night Anticipated Industrial/product designer Information: see above Ability/Limitations of Caregiver: daughter goes home every day 530 pm and returns at 730 am Caregiver Availability: 24/7 Discharge Plan Discussed with Primary Caregiver: Yes Is Caregiver In Agreement with Plan?: Yes Does Caregiver/Family have Issues with Lodging/Transportation while Pt is in Rehab?: No  Daughter stays with the patient in pt's home 730 am until 5:30 pm daily. Has three hired caregivers who provide supervision when daughter not present.  Goals/Additional Needs Patient/Family Goal for Rehab: supervision with PT and OT Expected length of stay: ELOS 5 to 8 days Special Service Needs:  Patietn is VERY HOH; does not wear hearing aids Pt/Family Agrees to Admission and willing to participate: Yes Program Orientation Provided & Reviewed with Pt/Caregiver Including Roles  & Responsibilities: Yes Additional Information Needs: Patietn sees Dr. Wynn Banker in outpt for the past 3 years  Decrease burden of Care through IP rehab admission: n/a  Possible need for SNF placement upon discharge: not anticipated  Patient Condition: This patient's medical and functional status has changed since the consult dated 08/14/2018 in which the Rehabilitation Physician determined and documented that the patient was potentially appropriate for intensive rehabilitative care in an inpatient rehabilitation facility. Patient performed well with Physical Therapy on 12/30 afternoon and very alert and attentive after therapy. Issues have been addressed and update has been discussed with Dr. Riley Kill and patient now appropriate for inpatient rehabilitation. Will admit to inpatient rehab today.   Preadmission Screen Completed By:  Clois Dupes, 08/15/2018 11:19 AM ______________________________________________________________________   Discussed status with Dr. Riley Kill on 08/15/2018 at  1138 and received telephone approval for admission today.  Admission Coordinator:  Clois Dupes, time 9604 Date 08/15/2018

## 2018-08-15 NOTE — Progress Notes (Signed)
Physical Therapy Treatment Patient Details Name: Leah FillerMabel E Figueroa MRN: [id number] DOB: 1923-06-29 Today's Date: 08/15/2018    History of Present Illness Pt is a 84 y/o female admitted secondary to dizziness and L sided weakness. MRI revealed  large right temporal and parietal lobe ischemic infarction. PMH including but not limited to HTN, CAD, prior stroke with L sided weakness and dementia.     PT Comments    Patient seen for mobility progression. Patient continues to requires up to Min A for balance and stability with gait with poor attention to L side - patient often running into items on R side due to inattention. Patient to continue to benefit from CIR level therapies.     Follow Up Recommendations  CIR     Equipment Recommendations  None recommended by PT    Recommendations for Other Services Rehab consult     Precautions / Restrictions Precautions Precautions: Fall Precaution Comments: L sided inattention Restrictions Weight Bearing Restrictions: No    Mobility  Bed Mobility Overal bed mobility: Needs Assistance Bed Mobility: Supine to Sit;Sit to Supine     Supine to sit: Min assist Sit to supine: Min assist   General bed mobility comments: Light Min A to achieve full upright sitting posture  Transfers Overall transfer level: Needs assistance Equipment used: Rolling walker (2 wheeled) Transfers: Sit to/from UGI CorporationStand;Stand Pivot Transfers Sit to Stand: Min assist;Min guard Stand pivot transfers: Min guard       General transfer comment: light A to power up from bedside; cueing for hand placement and sequencing  Ambulation/Gait Ambulation/Gait assistance: Min assist;+2 safety/equipment Gait Distance (Feet): 60 Feet Assistive device: Rolling walker (2 wheeled) Gait Pattern/deviations: Step-through pattern;Decreased stride length;Drifts right/left;Trunk flexed Gait velocity: decreased   General Gait Details: poor awareness to L side - drifts to R even with  consistent cueing for safety   Stairs             Wheelchair Mobility    Modified Rankin (Stroke Patients Only) Modified Rankin (Stroke Patients Only) Pre-Morbid Rankin Score: Moderately severe disability Modified Rankin: Moderately severe disability     Balance Overall balance assessment: Needs assistance Sitting-balance support: No upper extremity supported;Feet supported Sitting balance-[name] Scale: Fair     Standing balance support: Bilateral upper extremity supported;During functional activity Standing balance-[name] Scale: Poor Standing balance comment: reliant on external support                            Cognition Arousal/Alertness: Awake/alert Behavior During Therapy: WFL for tasks assessed/performed Overall Cognitive Status: Impaired/Different from baseline Area of Impairment: Memory;Following commands;Safety/judgement;Problem solving                     Memory: Decreased short-term memory;Decreased recall of precautions Following Commands: Follows one step commands consistently;Follows one step commands with increased time Safety/Judgement: Decreased awareness of deficits;Decreased awareness of safety   Problem Solving: Difficulty sequencing;Requires verbal cues;Requires tactile cues General Comments: requires consistent verbal and tactile cueing to complete tasks successfully;       Exercises      General Comments        Pertinent Vitals/Pain Pain Assessment: No/denies pain    Home Living   Living Arrangements: (Pateitn lives in her home; daughter there during the day and)             Additional Comments: Has used [facility] in the past    Prior Function Level of Independence:  Needs assistance(supervision with RW)      Comments: VERY HOH; no hearing aids   PT Goals (current goals can now be found in the care plan section) Acute Rehab PT Goals Patient Stated Goal: to return to PLOF PT Goal Formulation:  With patient/family Time For Goal Achievement: 08/26/18 Potential to Achieve Goals: Good Progress towards PT goals: Progressing toward goals    Frequency    Min 4X/week      PT Plan Current plan remains appropriate    Co-evaluation              AM-PAC PT "6 Clicks" Mobility   Outcome Measure  Help needed turning from your back to your side while in a flat bed without using bedrails?: None Help needed moving from lying on your back to sitting on the side of a flat bed without using bedrails?: A Little Help needed moving to and from a bed to a chair (including a wheelchair)?: A Little Help needed standing up from a chair using your arms (e.g., wheelchair or bedside chair)?: A Little Help needed to walk in hospital room?: A Little Help needed climbing 3-5 steps with a railing? : A Lot 6 Click Score: 18    End of Session Equipment Utilized During Treatment: Gait belt Activity Tolerance: Patient tolerated treatment well Patient left: in bed;with family/visitor present Nurse Communication: Mobility status PT Visit Diagnosis: Other abnormalities of gait and mobility (R26.89)     Time: [id number] PT Time Calculation (min) (ACUTE ONLY): 19 min  Charges:  $Gait Training: 8-22 mins                      Kipp LaurenceStephanie R Aaron, PT, DPT Supplemental Physical Therapist 08/15/18 2:34 PM Pager: [phone] Office: [phone]

## 2018-08-16 ENCOUNTER — Inpatient Hospital Stay (HOSPITAL_COMMUNITY): Payer: Medicare Other | Admitting: Occupational Therapy

## 2018-08-16 ENCOUNTER — Inpatient Hospital Stay (HOSPITAL_COMMUNITY): Payer: Medicare Other | Admitting: Physical Therapy

## 2018-08-16 ENCOUNTER — Inpatient Hospital Stay (HOSPITAL_COMMUNITY): Payer: Medicare Other

## 2018-08-16 DIAGNOSIS — E46 Unspecified protein-calorie malnutrition: Secondary | ICD-10-CM

## 2018-08-16 DIAGNOSIS — K5901 Slow transit constipation: Secondary | ICD-10-CM

## 2018-08-16 DIAGNOSIS — H9193 Unspecified hearing loss, bilateral: Secondary | ICD-10-CM

## 2018-08-16 DIAGNOSIS — E876 Hypokalemia: Secondary | ICD-10-CM

## 2018-08-16 LAB — CBC WITH DIFFERENTIAL/PLATELET
Abs Immature Granulocytes: 0.02 10*3/uL (ref 0.00–0.07)
BASOS PCT: 1 %
Basophils Absolute: 0.1 10*3/uL (ref 0.0–0.1)
EOS ABS: 0.1 10*3/uL (ref 0.0–0.5)
Eosinophils Relative: 2 %
HCT: 37.4 % (ref 36.0–46.0)
Hemoglobin: 12.5 g/dL (ref 12.0–15.0)
Immature Granulocytes: 0 %
Lymphocytes Relative: 19 %
Lymphs Abs: 1.4 10*3/uL (ref 0.7–4.0)
MCH: 31.3 pg (ref 26.0–34.0)
MCHC: 33.4 g/dL (ref 30.0–36.0)
MCV: 93.7 fL (ref 80.0–100.0)
Monocytes Absolute: 0.8 10*3/uL (ref 0.1–1.0)
Monocytes Relative: 11 %
Neutro Abs: 4.9 10*3/uL (ref 1.7–7.7)
Neutrophils Relative %: 67 %
Platelets: 226 10*3/uL (ref 150–400)
RBC: 3.99 MIL/uL (ref 3.87–5.11)
RDW: 12.7 % (ref 11.5–15.5)
WBC: 7.3 10*3/uL (ref 4.0–10.5)
nRBC: 0 % (ref 0.0–0.2)

## 2018-08-16 LAB — COMPREHENSIVE METABOLIC PANEL
ALT: 16 U/L (ref 0–44)
AST: 26 U/L (ref 15–41)
Albumin: 3.1 g/dL — ABNORMAL LOW (ref 3.5–5.0)
Alkaline Phosphatase: 51 U/L (ref 38–126)
Anion gap: 5 (ref 5–15)
BUN: 12 mg/dL (ref 8–23)
CO2: 29 mmol/L (ref 22–32)
Calcium: 9 mg/dL (ref 8.9–10.3)
Chloride: 106 mmol/L (ref 98–111)
Creatinine, Ser: 0.56 mg/dL (ref 0.44–1.00)
GFR calc Af Amer: >60 mL/min (ref >60)
GFR calc non Af Amer: >60 mL/min (ref >60)
Glucose, Bld: 97 mg/dL (ref 70–99)
POTASSIUM: 3.2 mmol/L — AB (ref 3.5–5.1)
Sodium: 140 mmol/L (ref 135–145)
Total Bilirubin: 0.7 mg/dL (ref 0.3–1.2)
Total Protein: 6.1 g/dL — ABNORMAL LOW (ref 6.5–8.1)

## 2018-08-16 MED ORDER — POTASSIUM CHLORIDE CRYS ER 20 MEQ PO TBCR
30.0000 meq | EXTENDED_RELEASE_TABLET | Freq: Two times a day (BID) | ORAL | Status: AC
  Administered 2018-08-16 (×2): 30 meq via ORAL
  Filled 2018-08-16 (×2): qty 1

## 2018-08-16 MED ORDER — PRO-STAT SUGAR FREE PO LIQD
30.0000 mL | Freq: Two times a day (BID) | ORAL | Status: DC
  Administered 2018-08-16 – 2018-08-23 (×15): 30 mL via ORAL
  Filled 2018-08-16 (×15): qty 30

## 2018-08-16 NOTE — Progress Notes (Signed)
[place] PHYSICAL MEDICINE & REHABILITATION PROGRESS NOTE  Subjective/Complaints: Patient seen laying in bed this morning.  She states she slept well overnight.  She states she is ready to begin therapies today.  ROS: Denies CP, SOB, nausea, vomiting, diarrhea.  Objective: Vital Signs: Blood pressure 116/61, pulse 71. No results found. Recent Labs    08/16/18 0507  WBC 7.3  HGB 12.5  HCT 37.4  PLT 226   Recent Labs    08/16/18 0507  NA 140  K 3.2*  CL 106  CO2 29  GLUCOSE 97  BUN 12  CREATININE 0.56  CALCIUM 9.0    Physical Exam: BP 116/61   Pulse 71  Constitutional: No distress . Vital signs reviewed. HENT: Normocephalic.  Atraumatic. Eyes: EOMI. No discharge. Cardiovascular: RRR. No JVD. Respiratory: CTA Bilaterally. Normal effort. GI: BS +. Non-distended. Musc: No edema or tenderness in extremities. Neurological:  Alert HOH Makes good eye contact with examiner. She follows simple commands.  Motor: Grossly 4/5 throughout, slightly weaker left upper extremity Skin: Skin is warm. She is not diaphoretic.  Psychiatric: She has a normal mood and affect. Her behavior is normal.    Assessment/Plan: 1. Functional deficits secondary to right MCA infarct which require 3+ hours per day of interdisciplinary therapy in a comprehensive inpatient rehab setting.  Physiatrist is providing close team supervision and 24 hour management of active medical problems listed below.  Physiatrist and rehab team continue to assess barriers to discharge/monitor patient progress toward functional and medical goals  Care Tool:  Bathing              Bathing assist       Upper Body Dressing/Undressing Upper body dressing   What is the patient wearing?: Hospital gown only    Upper body assist      Lower Body Dressing/Undressing Lower body dressing      What is the patient wearing?: Incontinence brief     Lower body assist       Toileting Toileting     Toileting assist       Transfers Chair/bed transfer  Transfers assist           Locomotion Ambulation   Ambulation assist              Walk 10 feet activity   Assist           Walk 50 feet activity   Assist           Walk 150 feet activity   Assist           Walk 10 feet on uneven surface  activity   Assist           Wheelchair     Assist               Wheelchair 50 feet with 2 turns activity    Assist            Wheelchair 150 feet activity     Assist            Medical Problem List and Plan: 1.  Left side weakness secondary to right posterior MCA territory infarction as well as history of thrombotic stroke involving right MCA artery 2016.  Begin CIR  Notes reviewed- lives with daughter with personal care attendant and history of CVA, images reviewed-right MCA infarct, labs reviewed 2.  DVT Prophylaxis/Anticoagulation: subcutaneous Lovenox. Monitor for any bleeding episodes 3. Pain Management:  Tylenol as needed 4. Mood:  Xanax 0.25 mg daily at bedtime 5. Neuropsych: This patient is ?fully capable of making decisions on her own behalf. 6. Skin/Wound Care:  Routine skin checks 7. Fluids/Electrolytes/Nutrition:  Routine in and out's             -encourage PO 8. Hypertension. Lopressor 50 mg twice a day  Monitor with increased mobility 9. Hyperlipidemia. Lipitor 10. Hypothyroidism. Synthroid 11. Constipation. Laxative assistance 12.  Hypokalemia  Potassium 3.2 on 1/1  Supplement initiated x1 day  Continue to monitor 13.  Hypoalbuminemia  Supplement initiated on 1/1   LOS: 1 days A FACE TO FACE EVALUATION WAS PERFORMED   Karis JubaAnil  08/16/2018, 10:14 AM

## 2018-08-16 NOTE — IPOC Note (Signed)
Overall Plan of Care [facility]) Patient Details Name: Leah Figueroa MRN: [id number] DOB: [birth year]  Admitting Diagnosis: Right MCA infarct  Hospital Problems: Active Problems:   Right middle cerebral artery stroke (HCC)   Hypoalbuminemia due to protein-calorie malnutrition (HCC)   Hypokalemia   Slow transit constipation   Bilateral hearing loss     Functional Problem List: Nursing Endurance, Medication Management, Safety, Motor, Skin Integrity, Sensory, Bowel, Bladder  PT Balance, Motor, Safety, Behavior, Nutrition, Perception, Endurance, Sensory  OT Balance, Cognition, Endurance, Motor, Perception, Safety, Sensory, Vision  SLP    TR         Basic ADL's: OT Bathing, Dressing, Toileting, Grooming     Advanced  ADL's: OT       Transfers: PT Bed Mobility, Bed to Chair, Car, Occupational psychologist, Research scientist (life sciences): PT Ambulation, Stairs     Additional Impairments: OT Fuctional Use of Upper Extremity  SLP        TR      Anticipated Outcomes Item Anticipated Outcome  [place] Feeding no goal, pt is already s/u with eating  Swallowing      Basic Fuelling-care  supervision  Toileting  supervision   Bathroom Transfers supervision  Bowel/Bladder  Min assist  Transfers  supervision with LRAD  Locomotion  supervision with LRAD  Communication     Cognition     Pain  < 3  Safety/Judgment  Supervision   Therapy Plan: PT Intensity: Minimum of 1-2 x/day ,45 to 90 minutes PT Frequency: 5 out of 7 days PT Duration Estimated Length of Stay: 6-8 days OT Intensity: Minimum of 1-2 x/day, 45 to 90 minutes OT Frequency: 5 out of 7 days OT Duration/Estimated Length of Stay: 7-8 days      Team Interventions: Nursing Interventions Patient/Family Education, Bladder Management, Bowel Management, Skin Care/Wound Management, Cognitive Remediation/Compensation, Disease Management/Prevention, Medication Management  PT interventions Ambulation/gait training, Cognitive  remediation/compensation, Discharge planning, DME/adaptive equipment instruction, Functional mobility training, Pain management, Psychosocial support, Splinting/orthotics, Therapeutic Activities, UE/LE Strength taining/ROM, Visual/perceptual remediation/compensation, Wheelchair propulsion/positioning, UE/LE Coordination activities, Therapeutic Exercise, Stair training, Patient/family education, Neuromuscular re-education, Functional electrical stimulation, Disease management/prevention, Firefighter, Warden/ranger  OT Interventions Warden/ranger, Discharge planning, Functional mobility training, DME/adaptive equipment instruction, Neuromuscular re-education, Patient/family education, Psychosocial support, Therapeutic Activities, F[place]el Care/advanced ADL retraining, Therapeutic Exercise, UE/LE Strength taining/ROM, UE/LE Coordination activities, Visual/perceptual remediation/compensation  SLP Interventions    TR Interventions    SW/CM Interventions Discharge Planning, Psychosocial Support, Patient/Family Education   Barriers to Discharge MD  Medical stability and Lack of/limited family support  Nursing      PT (baseline cognitive deficits)    OT      SLP      SW       Team Discharge Planning: Destination: PT-Home ,OT- Home , SLP-  Projected Follow-up: PT-Home health PT, 24 hour supervision/assistance, OT-  Home health OT, SLP-  Projected Equipment Needs: PT-(transport w/c, RW), OT-  , SLP-  Equipment Details: PT- , OT-  Patient/family involved in discharge planning: PT- Patient, Patient unable/family or caregiver not available,  OT-Family member/caregiver, SLP-   MD ELOS: 6-9 days. Medical Rehab Prognosis:  Good Assessment: 85 year old right-handed female history of hypertension, CAD, right MCA infarction in the past maintained on Plavix and received inpatient rehabilitation services 2016, dementia. She has a personal care attendant. Required some  assistance for bathing and dressing at baseline. Presented 08/12/2018 with increasing left-sided weakness. CT of the head reviewed, showing right MCA  infarction. MRI/MRA follow-up notes acute ischemic right posterior MCA territory infarction no associated hemorrhage. Multiple chronic microhemorrhage is seen scattered throughout the cerebellum. No large vessel occlusion. Echocardiogram with ejection fraction of 60% grade 2 diastolic dysfunction. Carotid Dopplers with no ICA stenosis. Tolerating a regular diet. Patient with resulting functional deficits with mobility, transfers, [place]-care.  Will set goals for Supervision with PT/OT.   See Team Conference Notes for weekly updates to the plan of care

## 2018-08-16 NOTE — Progress Notes (Signed)
Physical Therapy Session Note  Patient Details  Name: Leah Figueroa MRN: [id number] Date of Birth: 10/03/1922  Today's Date: 08/16/2018 PT Individual Time: 1445-1545 PT Individual Time Calculation (min): 60 min   Short Term Goals: Week 1:  PT Short Term Goal 1 (Week 1): STG = LTG due to short ELOS.  Skilled Therapeutic Interventions/Progress Updates:   Pt asleep in recliner but easily awakened.  Daughter present, who stated that pt has had very little to eat or drink since CVA.  PT discussed food choices with dtr and pt.  PT brought Pbutter and graham crackers into room.  Pt was unable to prepare peanut butter on crackers due to L inattention and L hand inattention.  After set-up, pt was able to count 4 crackers in front of her, but needed min cues to attend to those on L as she ate.  Pt was motivated to drink water while eating, safely.  Vision limited her ability to feed herself, even when food was to r of midline; she was successful if item was placed in her R hand.  Sit>< stand from recliner with mod assist..  neuromuscular re-education via forced use, multimodal cues in sitting for 10 x 1 each: R/L long arc quad knee extension, bil ankle pumps, R/L marching;  in standing  for 5 x 1 each R/L UE reaching while performing calf raises with min assist for balance, R/L lateral stepping, 10 x 1 mini squats.  Gait training in room with RW to /from toilet, min assist.  Toilet transfer using wall rail and RW, mod assist.  Pt's brief noted to be full of dark urine.  Pt voided small amount yellow urine continently on toilet.  PT informed Asher Muir, NT.  With set-up, pt used wet washcloth to clean peri area with mod assistance.  Pt attempted to use L hand consistently when pulling up a clean pull-up and PJ pants, but inattention/apraxia causes erratic movements of LUE.  Use of hand cleaner in standing with min assist to incorporate L hand.    Gait to return to bed.  Pt doffed sweat shirt and donned PJ top  with max assistance. Sit> supine with close supervision.    Pt left resting in bed with needs at hand and alarm set. Daughter left for work.  Therapy Documentation Precautions:  Precautions Precautions: Fall Precaution Comments: L inattention, very HOH (no hearing aides), baseline dementia Restrictions Weight Bearing Restrictions: No    Therapy/Group: Individual Therapy  Dystany Duffy 08/16/2018, 3:49 PM

## 2018-08-16 NOTE — Evaluation (Signed)
Occupational Therapy Assessment and Plan  Patient Details  Name: Leah Figueroa MRN: [id number] Date of Birth: [birth year]  OT Diagnosis: apraxia, cognitive deficits, disturbance of vision, muscle weakness (generalized) and impaired proprioception and kinesthesia Rehab Potential: Rehab Potential (ACUTE ONLY): Good ELOS: 7-8 days   Today's Date: 08/16/2018 OT Individual Time: 1245-8099 OT Individual Time Calculation (min): 75 min     Problem List:  Patient Active Problem List   Diagnosis Date Noted  . Hypoalbuminemia due to protein-calorie malnutrition ([place])   . Hypokalemia   . Slow transit constipation   . Bilateral hearing loss   . Right middle cerebral artery stroke ([place]) 08/15/2018  . Pressure injury of skin 08/14/2018  . Cerebrovascular accident (CVA) ([place])   . Diastolic dysfunction   . History of CVA (cerebrovascular accident)   . Dementia without behavioral disturbance ([place])   . Tachypnea   . Acute right MCA stroke ([place]) 08/11/2018  . Abdominal pain 08/11/2018  . Hematochezia 05/15/2017  . Coronary artery disease 05/15/2017  . Other and unspecified hyperlipidemia 05/15/2017  . Hypothyroidism 05/15/2017  . HTN (hypertension) 05/15/2017  . TIA (transient ischemic attack) 05/02/2017  . History of stroke 03/07/2017  . Gait abnormality 03/07/2017  . Subacromial impingement of left shoulder 02/09/2016  . Hemiparesis affecting left side as late effect of stroke ([place]) 01/20/2016  . Vertigo 09/03/2015  . Dehydration 09/03/2015  . Acute lower UTI 09/03/2015  . Prolonged Q-T interval on ECG 09/03/2015  . Left-sided neglect 07/01/2015  . History of right MCA stroke 07/01/2015  . Altered sensation due to stroke 04/29/2015  . Cognitive deficit, post-stroke 04/29/2015  . Pseudogout of left wrist 03/28/2015  . Hyperlipidemia LDL goal <70 03/26/2015  . Hyperglycemia 03/26/2015  . Thrombotic stroke involving right middle cerebral artery ([place]) 03/26/2015  . Embolic stroke ([place])   .  Left hemiplegia ([place])   . Homonymous hemianopia   . Chest wall trauma 01/11/2014  . Injury of left lower arm 01/11/2014  . Headache(784.0) 01/04/2013  . Allergic rhinitis, cause unspecified 01/04/2013  . Anemia 04/19/2012  . Epistaxis, recurrent 04/19/2012  . Hypothyroidism 01/01/2008  . Essential hypertension 01/01/2008  . HYPERTENSION 01/01/2008  . MYOCARDIAL INFARCTION, ACUTE, ANTERIOR WALL 01/01/2008  . LEG CRAMPS 01/01/2008  . ADENOCARCINOMA, BREAST, HX OF 01/01/2008  . DIVERTICULOSIS, COLON, HX OF 01/01/2008  . ARTHRITIS, HX OF 01/01/2008  . RADIATION THERAPY, HX OF 01/01/2008  . CATARACT EXTRACTION, HX OF 01/01/2008  . CHOLECYSTECTOMY, LAPAROSCOPIC, HX OF 01/01/2008    Past Medical History:  Past Medical History:  Diagnosis Date  . Acute myocardial infarction of other anterior wall, episode of care unspecified   . Arthritis   . Coronary artery disease   . Other and unspecified hyperlipidemia   . Personal history of arthritis   . Personal history of malignant neoplasm of breast    s/p lumpectomy  . Stroke [facility]) 03/2015   "left sided issues"  . Unspecified essential hypertension   . Unspecified hypothyroidism    Past Surgical History:  Past Surgical History:  Procedure Laterality Date  . BACK SURGERY    . CATARACT EXTRACTION     left-2006, right 2011  . CHOLECYSTECTOMY    . intrathoracic goiter resection    . lumpectomy for breast cancer Left 80's   radiation therapy  . right thyroid lobectomy     Hx Goiter    Assessment & Plan Clinical Impression:Leah Figueroa is a 85 year old right-handed female history of hypertension, CAD, right MCA infarction in  the past maintained on Plavix and received inpatient rehabilitation services 2016, dementia. Per chart review and daughter, patient lives with daughter. One level home with one step to entry. She also has a personal care attendant. Patient ambulated with the use of rolling walker prior to admission. Required some  assistance for bathing and dressing. Presented 08/12/2018 with increasing left-sided weakness. CT of the head reviewed, showing right MCA infarction. MRI/MRA follow-up notes acute ischemic right posterior MCA territory infarction no associated hemorrhage. Multiple chronic microhemorrhage is seen scattered throughout the cerebellum. No large vessel occlusion. Echocardiogram with ejection fraction of 62% grade 2 diastolic dysfunction. Carotid Dopplers with no ICA stenosis. Maintain on aspirin for CVA prophylaxis. Subcutaneous Lovenox for DVT prophylaxis. Tolerating a regular diet. Therapy evaluations completed with recommendations of physical medicine rehabilitation consult. Patient was admitted for a compress rehabilitation program.  Patient transferred to CIR on 08/15/2018 .    Patient currently requires mod with basic [place]on-care skills secondary to muscle weakness, decreased cardiorespiratoy endurance, impaired timing and sequencing, unbalanced muscle activation, decreased coordination and decreased motor planning, decreased visual perceptual skills, decreased visual motor skills and field cut, decreased attention to left, decreased attention, decreased awareness, decreased problem solving, decreased safety awareness, decreased memory and delayed processing and decreased standing balance, decreased postural control and decreased balance strategies.  Prior to hospitalization, patient could complete BADLs with supervision.  Patient will benefit from skilled intervention to increase independence with basic [place]-care skills prior to discharge home with care partner.  Anticipate patient will require 24 hour supervision and follow up home health.  OT - End of Session Activity Tolerance: Tolerates 10 - 20 min activity with multiple rests Endurance Deficit: Yes Endurance Deficit Description: generalized weakness OT Assessment Rehab Potential (ACUTE ONLY): Good OT Patient demonstrates impairments in the  following area(s): Balance;Cognition;Endurance;Motor;Perception;Safety;Sensory;Vision OT Basic ADL's Functional Problem(s): Bathing;Dressing;Toileting;Grooming OT Transfers Functional Problem(s): Toilet;Tub/Shower OT Additional Impairment(s): Fuctional Use of Upper Extremity OT Plan OT Intensity: Minimum of 1-2 x/day, 45 to 90 minutes OT Frequency: 5 out of 7 days OT Duration/Estimated Length of Stay: 7-8 days OT Treatment/Interventions: Balance/vestibular training;Discharge planning;Functional mobility training;DME/adaptive equipment instruction;Neuromuscular re-education;Patient/family education;Psychosocial support;Therapeutic Activities;[place] Care/advanced ADL retraining;Therapeutic Exercise;UE/LE Strength taining/ROM;UE/LE Coordination activities;Visual/perceptual remediation/compensation OT [place] Feeding Anticipated Outcome(s): no goal, pt is already s/u with eating OT Basic [place]-Care Anticipated Outcome(s): supervision OT Toileting Anticipated Outcome(s): supervision OT Bathroom Transfers Anticipated Outcome(s): supervision OT Recommendation Patient destination: Home Follow Up Recommendations: Home health OT   Skilled Therapeutic Intervention Pt seen for initial evaluation and ADL training.  Her daughter arrived part way through the session and discussed her mom's PLOF.   Overall pt participated well and was eager to work on skills herself.  She does not have limited LUE strength but does have very impaired sensation resulting in "alien" arm like movements. Pt does not attend to L side well but she does actively try to use her arm.  She has great difficulty with coordination which is causing her to need min -mod A with Gentzler care.  Discussed goals with pt/ dtr of S, estimated LOS. POC.  Pt resting in w/c in room with daughter and all needs met.   OT Evaluation Precautions/Restrictions  Precautions Precautions: Fall Precaution Comments: L inattention, very HOH (no hearing aides), baseline  dementia Restrictions Weight Bearing Restrictions: No    Vital Signs Therapy Vitals BP: 116/61 Patient Position (if appropriate): Lying Pain Pain Assessment Pain Score: 0-No pain Home Living/Prior Functioning Home Living Family/patient expects to be discharged to:: Private residence  Living Arrangements: Children Available Help at Discharge: Family, Personal care attendant, Available 24 hours/day Type of Home: House Home Access: Stairs to enter [employer] of Steps: 1(3-4") Entrance Stairs-Rails: Right, Left Home Layout: One level Bathroom Shower/Tub: Tub/shower unit, Multimedia programmer: Standard Bathroom Accessibility: Yes Additional Comments: Has used [place] in the past  Lives With: (daughter stays with pt during the day & has caregiver stay with her at night) IADL History Leisure and Hobbies: church, reads, gardens, reads Prior Function Level of Independence: Independent with gait, Independent with transfers, Requires assistive device for independence(used rollator PTA, required assistance for single step negotiation into house)  Able to Take Stairs?: Yes(daughter provided assistance for single step negotation with rollator into house) Driving: No Vocation: Retired Comments: daughter would take pt to [employer] once/week for long distance ambulation/exercise ADL ADL Eating: Set up Grooming: Minimal assistance Where Assessed-Grooming: Sitting at sink Upper Body Bathing: Minimal assistance Where Assessed-Upper Body Bathing: Sitting at sink Lower Body Bathing: Moderate assistance Where Assessed-Lower Body Bathing: Sitting at sink Upper Body Dressing: Moderate assistance Where Assessed-Upper Body Dressing: Sitting at sink Lower Body Dressing: Moderate assistance Where Assessed-Lower Body Dressing: Sitting at sink Toileting: Moderate assistance Where Assessed-Toileting: Glass blower/designer: Psychiatric nurse Method:  Counselling psychologist: Grab bars Vision Baseline Vision/History: Wears glasses Wears Glasses: At all times Patient Visual Report: (daughter reports pt's vision comes & goes but does not notice pattern to this) Vision Assessment?: Vision impaired- to be further tested in functional context Alignment/Gaze Preference: Gaze right Tracking/Visual Pursuits: Left eye does not track laterally;Right eye does not track medially Visual Fields: Left visual field deficit Perception  Perception: Impaired Inattention/Neglect: Does not attend to left side of body Spatial Orientation: difficulty placing L hand on RW (hand too far to right of handle) Praxis Praxis: Impaired Praxis Impairment Details: Motor planning Cognition Overall Cognitive Status: History of cognitive impairments - at baseline Arousal/Alertness: Awake/alert Orientation Level: Place;Person;Situation Person: Oriented Place: Oriented(knew she was in a hospital, wrong town) Situation: Disoriented Year: 2020 Month: January Day of Week: Incorrect(Tuesday) Memory: Impaired Memory Impairment: Decreased recall of new information;Decreased short term memory;Decreased long term memory Decreased Short Term Memory: Verbal basic;Functional basic Immediate Memory Recall: Sock;Blue;Bed Memory Recall: (unable to recall with cues) Sustained Attention: Impaired Sustained Attention Impairment: Verbal basic;Functional basic Awareness: Impaired Awareness Impairment: Intellectual impairment Problem Solving: Impaired Problem Solving Impairment: Verbal basic;Functional basic Reasoning: Impaired Decision Making: Impaired Behaviors: (pleasantly confused) Safety/Judgment: Impaired Sensation Sensation Light Touch: Impaired by gross assessment(pt can feel some light touch) Hot/Cold: Not tested Proprioception: Impaired by gross assessment Stereognosis: Impaired by gross assessment Additional Comments: mod imp kinesthesia/  proprioception in LUE Coordination Gross Motor Movements are Fluid and Coordinated: No Fine Motor Movements are Fluid and Coordinated: No Coordination and Movement Description: "alien" arm symptoms of LUE, pt does attempt to move and use her LUE but due to impaired proprioception, kinesthesia, awareness her LUE is severely impaired Finger Nose Finger Test: not tested Motor  Motor Motor: Abnormal postural alignment and control(L hemiparesis) Motor - Skilled Clinical Observations: generalized weakness Mobility  Bed Mobility Bed Mobility: Rolling Left;Supine to Sit;Sit to Supine Rolling Left: Supervision/Verbal cueing Supine to Sit: Supervision/Verbal cueing Sit to Supine: Supervision/Verbal cueing Transfers Sit to Stand: Minimal Assistance - Patient > 75%  Trunk/Postural Assessment  Cervical Assessment Cervical Assessment: Exceptions to WFL(forward head) Thoracic Assessment Thoracic Assessment: Exceptions to WFL(slightly rounded shoulders) Lumbar Assessment Lumbar Assessment: Exceptions to WFL(posterior pelvic tilt) Postural Control Postural Control:  Deficits on evaluation Righting Reactions: impaired Protective Responses: impaired  Balance Balance Balance Assessed: Yes Dynamic Standing Balance Dynamic Standing - Balance Support: Bilateral upper extremity supported Dynamic Standing - Level of Assistance: 4: Min assist Dynamic Standing - Balance Activities: (during gait with RW) Extremity/Trunk Assessment RUE Assessment RUE Assessment: Within Functional Limits LUE Assessment LUE Assessment: Within Functional Limits     Refer to Care Plan for Long Term Goals  Recommendations for other services: None    Discharge Criteria: Patient will be discharged from OT if patient refuses treatment 3 consecutive times without medical reason, if treatment goals not met, if there is a change in medical status, if patient makes no progress towards goals or if patient is discharged from  hospital.  The above assessment, treatment plan, treatment alternatives and goals were discussed and mutually agreed upon: by patient and by family  [place] 08/16/2018, 12:42 PM

## 2018-08-16 NOTE — Evaluation (Signed)
Physical Therapy Assessment and Plan  Patient Details  Name: Leah Figueroa MRN: [id number] Date of Birth: 07/05/1923  PT Diagnosis: Abnormality of gait, Cognitive deficits, Coordination disorder, Difficulty walking, Hemiparesis non-dominant, Impaired cognition and Muscle weakness Rehab Potential: Good ELOS: 6-8 days   Today's Date: 08/16/2018 PT Individual Time: 1108-1207 PT Individual Time Calculation (min): 59 min    Problem List:  Patient Active Problem List   Diagnosis Date Noted  . Hypoalbuminemia due to protein-calorie malnutrition ([place])   . Hypokalemia   . Slow transit constipation   . Bilateral hearing loss   . Right middle cerebral artery stroke ([place]) 08/15/2018  . Pressure injury of skin 08/14/2018  . Cerebrovascular accident (CVA) ([place])   . Diastolic dysfunction   . History of CVA (cerebrovascular accident)   . Dementia without behavioral disturbance ([place])   . Tachypnea   . Acute right MCA stroke ([place]) 08/11/2018  . Abdominal pain 08/11/2018  . Hematochezia 05/15/2017  . Coronary artery disease 05/15/2017  . Other and unspecified hyperlipidemia 05/15/2017  . Hypothyroidism 05/15/2017  . HTN (hypertension) 05/15/2017  . TIA (transient ischemic attack) 05/02/2017  . History of stroke 03/07/2017  . Gait abnormality 03/07/2017  . Subacromial impingement of left shoulder 02/09/2016  . Hemiparesis affecting left side as late effect of stroke ([place]) 01/20/2016  . Vertigo 09/03/2015  . Dehydration 09/03/2015  . Acute lower UTI 09/03/2015  . Prolonged Q-T interval on ECG 09/03/2015  . Left-sided neglect 07/01/2015  . History of right MCA stroke 07/01/2015  . Altered sensation due to stroke 04/29/2015  . Cognitive deficit, post-stroke 04/29/2015  . Pseudogout of left wrist 03/28/2015  . Hyperlipidemia LDL goal <70 03/26/2015  . Hyperglycemia 03/26/2015  . Thrombotic stroke involving right middle cerebral artery ([place]) 03/26/2015  . Embolic stroke ([place])   . Left  hemiplegia ([place])   . Homonymous hemianopia   . Chest wall trauma 01/11/2014  . Injury of left lower arm 01/11/2014  . Headache(784.0) 01/04/2013  . Allergic rhinitis, cause unspecified 01/04/2013  . Anemia 04/19/2012  . Epistaxis, recurrent 04/19/2012  . Hypothyroidism 01/01/2008  . Essential hypertension 01/01/2008  . HYPERTENSION 01/01/2008  . MYOCARDIAL INFARCTION, ACUTE, ANTERIOR WALL 01/01/2008  . LEG CRAMPS 01/01/2008  . ADENOCARCINOMA, BREAST, HX OF 01/01/2008  . DIVERTICULOSIS, COLON, HX OF 01/01/2008  . ARTHRITIS, HX OF 01/01/2008  . RADIATION THERAPY, HX OF 01/01/2008  . CATARACT EXTRACTION, HX OF 01/01/2008  . CHOLECYSTECTOMY, LAPAROSCOPIC, HX OF 01/01/2008    Past Medical History:  Past Medical History:  Diagnosis Date  . Acute myocardial infarction of other anterior wall, episode of care unspecified   . Arthritis   . Coronary artery disease   . Other and unspecified hyperlipidemia   . Personal history of arthritis   . Personal history of malignant neoplasm of breast    s/p lumpectomy  . Stroke [facility]) 03/2015   "left sided issues"  . Unspecified essential hypertension   . Unspecified hypothyroidism    Past Surgical History:  Past Surgical History:  Procedure Laterality Date  . BACK SURGERY    . CATARACT EXTRACTION     left-2006, right 2011  . CHOLECYSTECTOMY    . intrathoracic goiter resection    . lumpectomy for breast cancer Left 80's   radiation therapy  . right thyroid lobectomy     Hx Goiter    Assessment & Plan Clinical Impression: Patient is a 85 y.o. year old female history of hypertension, CAD, right MCA infarction in the past  maintained on Plavix and received inpatient rehabilitation services 2016, dementia. Per chart review and daughter, patient lives with daughter. One level home with one step to entry. She also has a personal care attendant. Patient ambulated with the use of rolling walker prior to admission. Required some assistance for  bathing and dressing. Presented 08/12/2018 with increasing left-sided weakness. CT of the head reviewed, showing right MCA infarction. MRI/MRA follow-up notes acute ischemic right posterior MCA territory infarction no associated hemorrhage. Multiple chronic microhemorrhage is seen scattered throughout the cerebellum. No large vessel occlusion. Echocardiogram with ejection fraction of 37% grade 2 diastolic dysfunction. Carotid Dopplers with no ICA stenosis. Maintain on aspirin for CVA prophylaxis. Subcutaneous Lovenox for DVT prophylaxis. Tolerating a regular diet. Therapy evaluations completed with recommendations of physical medicine rehabilitation consult. Patient was admitted for a compress rehabilitation program..  Patient transferred to CIR on 08/15/2018 .   Patient currently requires min with mobility secondary to muscle weakness, decreased cardiorespiratoy endurance, decreased coordination, decreased visual perceptual skills, decreased attention to left, decreased attention, decreased awareness, decreased problem solving, decreased safety awareness, decreased memory and delayed processing, and decreased standing balance, decreased postural control, hemiplegia and decreased balance strategies.  Prior to hospitalization, patient was supervision with rollator except min assist single step negotiation with rollator with mobility and lived with (daughter stays with pt during the day & has caregiver stay with her at night) in a House home.  Home access is 1(3-4")Stairs to enter.  Patient will benefit from skilled PT intervention to maximize safe functional mobility, minimize fall risk and decrease caregiver burden for planned discharge home with 24 hour assist.  Anticipate patient will benefit from follow up [facility] at discharge.  PT - End of Session Activity Tolerance: Tolerates 30+ min activity with multiple rests Endurance Deficit: Yes Endurance Deficit Description: generalized weakness PT Assessment Rehab  Potential (ACUTE/IP ONLY): Good PT Barriers to Discharge: (baseline cognitive deficits) PT Patient demonstrates impairments in the following area(s): Balance;Motor;Safety;Behavior;Nutrition;Perception;Endurance;Sensory PT Transfers Functional Problem(s): Bed Mobility;Bed to Chair;Car;Furniture PT Locomotion Functional Problem(s): Ambulation;Stairs PT Plan PT Intensity: Minimum of 1-2 x/day ,45 to 90 minutes PT Frequency: 5 out of 7 days PT Duration Estimated Length of Stay: 6-8 days PT Treatment/Interventions: Ambulation/gait training;Cognitive remediation/compensation;Discharge planning;DME/adaptive equipment instruction;Functional mobility training;Pain management;Psychosocial support;Splinting/orthotics;Therapeutic Activities;UE/LE Strength taining/ROM;Visual/perceptual remediation/compensation;Wheelchair propulsion/positioning;UE/LE Coordination activities;Therapeutic Exercise;Stair training;Patient/family education;Neuromuscular re-education;Functional electrical stimulation;Disease management/prevention;Community reintegration;Balance/vestibular training PT Transfers Anticipated Outcome(s): supervision with LRAD PT Locomotion Anticipated Outcome(s): supervision with LRAD PT Recommendation Follow Up Recommendations: Home health PT;24 hour supervision/assistance Patient destination: Home Equipment Recommended: (transport w/c, RW)  Skilled Therapeutic Intervention Patient received in w/c & agreeable to tx. Pt oriented to [place] only but great difficulty assessing cognition 2/2 significant hearing deficits & pt does not wear hearing aides. Pt's daughter [name]) present for session & educated on ELOS, weekly interdisciplinary team meetings, goals of therapy, need for staff only to assist pt with transfers at this time, daily therapy schedule and other various CIR information. [name] reports pt was ambulatory with rollator, min assist for single step negotiation into household prior to admission,  and would go to [employer] once/week for long distance ambulation/exercise. [name] reports great concerns regarding pt's decreased appetite and RN made aware. Pt presents with L inattention & [place] reports pt's vision will come and go; pt also demonstrates decreased perception as she has difficulty locating & picking up food off tray at end of session. Pt completes transfers with supervision<>min assist with intermittent posterior lean and reliance on sitting surface for balance, with  therapist attempting to provide instructional cuing for hand placement to push up on B armrests. Pt ambulates with RW up to 200 ft with min assist with assistance for steering/directional changes 2/2 unfamiliar environment. Pt negotiates single step with RW & min assist and therapist educates daughter this is the level I would recommend her d/c at, since she was min assist prior to admission. Pt completes car transfer with min assist to ambulate to car but can transfer BLE in/out without assist. Pt completes bed mobility in apartment with supervision but max cuing to lie straight in bed (daughter reports this is baseline). Discussed using transport w/c upon d/c for community mobility and recommendation to use RW vs rollator as pt cannot safely manage brakes. At end of session pt left sitting in recliner with daughter present & assisting pt with consuming lunch.  PT Evaluation Precautions/Restrictions Precautions Precautions: Fall Precaution Comments: L inattention, very HOH (no hearing aides), baseline dementia Restrictions Weight Bearing Restrictions: No  General Chart Reviewed: Yes Additional Pertinent History: HTN, CAD, prior stroke with L side weakness, dementia, a-fib with RVR, MI, HLD, arthritis, malignant neoplasm of breast, hypothyroidism Response to Previous Treatment: Patient with no complaints from previous session. Family/Caregiver Present: Yes(daughter, [name])  Pain No c/o pain  Home Living/Prior  Functioning Home Living Available Help at Discharge: Family;Personal care attendant;Available 24 hours/day Type of Home: House Home Access: Stairs to enter [employer] of Steps: 1(3-4") Entrance Stairs-Rails: Right;Left Home Layout: One level  Lives With: (daughter stays with pt during the day & has caregiver stay with her at night) Prior Function Level of Independence: Independent with gait;Independent with transfers;Requires assistive device for independence(used rollator PTA, required assistance for single step negotiation into house)  Able to Take Stairs?: Yes(daughter provided assistance for single step negotation with rollator into house) Driving: No Vocation: Retired Comments: daughter would take pt to [employer] once/week for long distance ambulation/exercise  Vision/Perception  Pt wears glasses at all time at baseline. [name] reports pt's vision "comes & goes" since stroke but unable to determine pattern to this.  Perception Perception: Impaired Inattention/Neglect: (decreased attention to L visual field) Spatial Orientation: difficulty placing L hand on RW (hand too far to right of handle)   Cognition Overall Cognitive Status: History of cognitive impairments - at baseline Arousal/Alertness: Awake/alert Orientation Level: Oriented to person Sustained Attention: Impaired Sustained Attention Impairment: Verbal basic;Functional basic Memory: Impaired Memory Impairment: Decreased recall of new information;Decreased short term memory;Decreased long term memory Decreased Short Term Memory: Verbal basic;Functional basic Awareness: Impaired Awareness Impairment: Intellectual impairment Problem Solving: Impaired Problem Solving Impairment: Verbal basic;Functional basic Reasoning: Impaired Decision Making: Impaired Behaviors: (pleasantly confused) Safety/Judgment: Impaired  Sensation Sensation Light Touch: Not tested Proprioception: Impaired by gross  assessment Coordination Gross Motor Movements are Fluid and Coordinated: No Fine Motor Movements are Fluid and Coordinated: No  Motor  Motor Motor: Abnormal postural alignment and control(L hemiparesis) Motor - Skilled Clinical Observations: generalized weakness   Mobility Bed Mobility Bed Mobility: Rolling Left;Supine to Sit;Sit to Supine Rolling Left: Supervision/Verbal cueing Supine to Sit: Supervision/Verbal cueing Sit to Supine: Supervision/Verbal cueing Transfers Transfers: Sit to Stand Sit to Stand: Minimal Assistance - Patient > 75%  Locomotion  Gait Ambulation: Yes Gait Assistance: Minimal Assistance - Patient > 75% Gait Distance (Feet): 200 Feet Assistive device: Rolling walker Gait Assistance Details: (cuing & assist for steering for directional changes) Gait Gait: Yes Gait Pattern: Impaired Gait Pattern: Decreased step length - right;Decreased step length - left;Decreased stride length(B knee flexion throughout  gait) Gait velocity: decreased Stairs / Additional Locomotion Stairs: Yes Stairs Assistance: Minimal Assistance - Patient > 75% Stair Management Technique: No rails;With walker Number of Stairs: 1 Height of Stairs: 3(inches) Wheelchair Mobility Wheelchair Mobility: No   Trunk/Postural Assessment  Cervical Assessment Cervical Assessment: Exceptions to WFL(forward head) Thoracic Assessment Thoracic Assessment: Exceptions to WFL(slightly rounded shoulders) Lumbar Assessment Lumbar Assessment: Exceptions to WFL(posterior pelvic tilt) Postural Control Postural Control: Deficits on evaluation Righting Reactions: impaired Protective Responses: impaired   Balance Balance Balance Assessed: Yes Dynamic Standing Balance Dynamic Standing - Balance Support: Bilateral upper extremity supported Dynamic Standing - Level of Assistance: 4: Min assist Dynamic Standing - Balance Activities: (during gait with RW)  Extremity Assessment  RUE Assessment RUE  Assessment: Within Functional Limits LUE Assessment LUE Assessment: Within Functional Limits RLE Assessment RLE Assessment: Within Functional Limits LLE Assessment LLE Assessment: Within Functional Limits    Refer to Care Plan for Long Term Goals  Recommendations for other services: None   Discharge Criteria: Patient will be discharged from PT if patient refuses treatment 3 consecutive times without medical reason, if treatment goals not met, if there is a change in medical status, if patient makes no progress towards goals or if patient is discharged from hospital.  The above assessment, treatment plan, treatment alternatives and goals were discussed and mutually agreed upon: by family  [place] 08/16/2018, 12:34 PM

## 2018-08-17 ENCOUNTER — Inpatient Hospital Stay (HOSPITAL_COMMUNITY): Payer: Medicare Other | Admitting: Physical Therapy

## 2018-08-17 ENCOUNTER — Inpatient Hospital Stay (HOSPITAL_COMMUNITY): Payer: Medicare Other

## 2018-08-17 NOTE — Progress Notes (Signed)
Per nursing, patient was given "Data Collection Information Summary for Patients in Inpatient Rehabilitation Facilities with attached Privacy Act Statement Health Care Records" upon admission.    Patient information reviewed and entered into e[facility] System by Becky Dalan Cowger, PPS coordinator. Information including medical coding, function ability, and quality indicators will be reviewed and updated through discharge.   

## 2018-08-17 NOTE — Care Management Note (Signed)
Inpatient Rehabilitation Center Individual Statement of Services  Patient Name:  Leah Figueroa  Date:  08/17/2018  Welcome to the Inpatient Rehabilitation Center.  Our goal is to provide you with an individualized program based on your diagnosis and situation, designed to meet your specific needs.  With this comprehensive rehabilitation program, you will be expected to participate in at least 3 hours of rehabilitation therapies Monday-Friday, with modified therapy programming on the weekends.  Your rehabilitation program will include the following services:  Physical Therapy (PT), Occupational Therapy (OT), Speech Therapy (ST), 24 hour per day rehabilitation nursing, Case Management (Social Worker), Rehabilitation Medicine, Nutrition Services and Pharmacy Services  Weekly team conferences will be held on Wednesday to discuss your progress.  Your Social Worker will talk with you frequently to get your input and to update you on team discussions.  Team conferences with you and your family in attendance may also be held.  Expected length of stay: 7-9 days  Overall anticipated outcome: supervision cueing  Depending on your progress and recovery, your program may change. Your Social Worker will coordinate services and will keep you informed of any changes. Your Social Worker's name and contact numbers are listed  below.  The following services may also be recommended but are not provided by the Inpatient Rehabilitation Center:    Home Health Rehabiltiation Services  Outpatient Rehabilitation Services    Arrangements will be made to provide these services after discharge if needed.  Arrangements include referral to agencies that provide these services.  Your insurance has been verified to be:  Medicare & [employer] Your primary doctor is:  Clinical biochemist  Pertinent information will be shared with your doctor and your insurance company.  Social Worker:  Dossie Der, SW [phone] or [phone]  Information discussed with and copy given to patient by: Lucy Chris, 08/17/2018, 10:10 AM

## 2018-08-17 NOTE — Progress Notes (Signed)
Physical Therapy Session Note  Patient Details  Name: Leah Figueroa MRN: [id number] Date of Birth: 12/25/1922  Today's Date: 08/17/2018 PT Individual Time: 0800-0910 AND 1345-1450 PT Individual Time Calculation (min): 70 min AND 65 min  Short Term Goals: Week 1:  PT Short Term Goal 1 (Week 1): STG = LTG due to short ELOS.  Skilled Therapeutic Interventions/Progress Updates:   Session 1:  Pt in supine and agreeable to therapy, no c/o pain. Session focused on functional mobility and cognitive remediation. Bed mobility w/ supervision and ambulated to/from toilet w/ CGA and min manual and verbal cues for L attention and RW management. Max assist for LE garment management and changed to new brief, continent void. Pt showers ~1x/week PTA, declined shower this morning. Stood at sink w/ CGA while washing face. Set-up w/ breakfast tray, frequent verbal cues to attend to L side of tray and for environmental scanning for objects she needed. Worked on gait w/ RW vs rollator. Per chart review, pt used rollator PTA. Ambulated 150' w/ rollator and CGA w/ verbal cues for safety in both controlled and household environment. Pt w/ improved LUE management on rollator vs RW and ambulates at more functional gait speed. Will continue to assess safest AD for pt to use at d/c. Worked on standing balance and L inattention while performing peg board task, needed max tactile/verbal/visual cues for successful completion. Returned to room via w/c for energy conservation, ended session in recliner with all needs in reach.   Session 2: Pt in supine and agreeable to therapy, no c/o pain. Pt's daughter brought her new set of clothes. Performed bathing and dressing at w/c level at sink. Mod assist overall for dressing and bathing tasks. Needed frequent verbal and visual cues to attend to L side of body and L environment, sequencing, and for safety w/ sit<>stands at sink. Min assist for sit<>stands and for standing balance w/o UE  support. Verbal cues to incorporated LUE as much as possible. Ambulated to/from therapy gym w/ CGA using rollator and manual assist for turns as pt unable to turn R or L when asked although she verbalizes that she is turning. NuStep 5 min @ level 1 to work on L body awareness and endurance training. Mod-max manual and verbal cues to attend to L side and for technique. Worked on LUE motor control w/ reaching tasks, L environmental attention, and visual scanning. Reached for bean bags and clothespins w/ verbal and visual cues for scanning and L attention. Intermittent rest breaks, therapist provided verbal cues to drink Ensure drink. Returned to room and ended session in recliner, all needs in reach.   Therapy Documentation Precautions:  Precautions Precautions: Fall Precaution Comments: L inattention, very HOH (no hearing aides), baseline dementia Restrictions Weight Bearing Restrictions: No Vital Signs: Therapy Vitals Temp: 98.3 F (36.8 C) Temp Source: Oral Pulse Rate: 84 Resp: 14 BP: 133/84 Patient Position (if appropriate): Lying Oxygen Therapy SpO2: 98 % O2 Device: Room Air  Therapy/Group: Individual Therapy  Leah Figueroa 08/17/2018, 9:11 AM

## 2018-08-17 NOTE — Progress Notes (Signed)
Social Work  Social Work Assessment and Plan  Patient Details  Name: Leah Figueroa MRN: [id number] Date of Birth: 1923-03-04  Today's Date: 08/17/2018  Problem List:  Patient Active Problem List   Diagnosis Date Noted  . Hypoalbuminemia due to protein-calorie malnutrition (HCC)   . Hypokalemia   . Slow transit constipation   . Bilateral hearing loss   . Right middle cerebral artery stroke (HCC) 08/15/2018  . Pressure injury of skin 08/14/2018  . Cerebrovascular accident (CVA) (HCC)   . Diastolic dysfunction   . History of CVA (cerebrovascular accident)   . Dementia without behavioral disturbance (HCC)   . Tachypnea   . Acute right MCA stroke (HCC) 08/11/2018  . Abdominal pain 08/11/2018  . Hematochezia 05/15/2017  . Coronary artery disease 05/15/2017  . Other and unspecified hyperlipidemia 05/15/2017  . Hypothyroidism 05/15/2017  . HTN (hypertension) 05/15/2017  . TIA (transient ischemic attack) 05/02/2017  . History of stroke 03/07/2017  . Gait abnormality 03/07/2017  . Subacromial impingement of left shoulder 02/09/2016  . Hemiparesis affecting left side as late effect of stroke (HCC) 01/20/2016  . Vertigo 09/03/2015  . Dehydration 09/03/2015  . Acute lower UTI 09/03/2015  . Prolonged Q-T interval on ECG 09/03/2015  . Left-sided neglect 07/01/2015  . History of right MCA stroke 07/01/2015  . Altered sensation due to stroke 04/29/2015  . Cognitive deficit, post-stroke 04/29/2015  . Pseudogout of left wrist 03/28/2015  . Hyperlipidemia LDL goal <70 03/26/2015  . Hyperglycemia 03/26/2015  . 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 Embolic stroke (HCC)   . Left hemiplegia (HCC)   . Homonymous hemianopia   . Chest wall trauma 01/11/2014  . Injury of left lower arm 01/11/2014  . Headache(784.0) 01/04/2013  . Allergic rhinitis, cause unspecified 01/04/2013  . Anemia 04/19/2012  . Epistaxis, recurrent 04/19/2012  . Hypothyroidism  01/01/2008  . Essential hypertension 01/01/2008  . HYPERTENSION 01/01/2008  . MYOCARDIAL INFARCTION, ACUTE, ANTERIOR WALL 01/01/2008  . LEG CRAMPS 01/01/2008  . ADENOCARCINOMA, BREAST, HX OF 01/01/2008  . DIVERTICULOSIS, COLON, HX OF 01/01/2008  . ARTHRITIS, HX OF 01/01/2008  . RADIATION THERAPY, HX OF 01/01/2008  . CATARACT EXTRACTION, HX OF 01/01/2008  . CHOLECYSTECTOMY, LAPAROSCOPIC, HX OF 01/01/2008   Past Medical History:  Past Medical History:  Diagnosis Date  . Acute myocardial infarction of other anterior wall, episode of care unspecified   . Arthritis   . Coronary artery disease   . Other and unspecified hyperlipidemia   . Personal history of arthritis   . Personal history of malignant neoplasm of breast    s/p lumpectomy  . Stroke [facility](HCC) 03/2015   "left sided issues"  . Unspecified essential hypertension   . Unspecified hypothyroidism    Past Surgical History:  Past Surgical History:  Procedure Laterality Date  . BACK SURGERY    . CATARACT EXTRACTION     left-2006, right 2011  . CHOLECYSTECTOMY    . intrathoracic goiter resection    . lumpectomy for breast cancer Left 80's   radiation therapy  . right thyroid lobectomy     Hx Goiter   Social History:  reports that she has never smoked. She has never used smokeless tobacco. She reports that she does not drink alcohol or use drugs.  Family / Support Systems Marital Status: Widow/Widower Patient Roles: Parent Children: Leah Figueroa-daughter [phone] Other Supports: Leah Figueroa-son in-law [phone] Anticipated Caregiver: Daughter and hired caregivers at night Ability/Limitations of Caregiver: Daughter provides her care during  the day and hired caregiver at night. Caregiver Availability: 24/7 Family Dynamics: Close knit fmaily pt has one child-daughter and she has been very involved since her first stroke in 2016. They have hired assist so daughter can sleeep at night. She has extended family who will visit and church  members .  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 She is here daily and provide emotional support.   Abuse/Neglect Abuse/Neglect Assessment Can Be Completed: Yes Physical Abuse: Denies Verbal Abuse: Denies Sexual Abuse: Denies Exploitation of patient/patient's resources: Denies [place]-Neglect: Denies  Emotional Status Pt's affect, behavior and adjustment status: Pt has always been independent and strong minded according to daughter. She recovered well form her last stroke and was mobile. She went to a NH in 2016 and then transitioned home with daughter. Daughter hopes to get her mobile like she was prior to admission and will work from here. Recent Psychosocial Issues: other health issues-past history of CVA needed 24 hr care prior to admission Psychiatric History: No history deferred depression screen due to pt's confusion and speech deficits. Will continue to re-assess and go from here. Daughter voiced pt wants her here always. Substance Abuse History: No issues  Patient / Family Perceptions, Expectations & Goals Pt/Family understanding of illness & functional limitations: Daughter can explain her stroke and decline in function. She does want to talk with the MD and ask questions regarding prognosis and realistic goals while here.  Will ask PA to speak with her. Premorbid pt/family roles/activities: Mother, grandmother, retiree, church member, etc Anticipated changes in roles/activities/participation: resume Pt/family expectations/goals: Daughter states: " I hope she can be mobile and move better before she leaves here."  Pt was sleeping  form her first therapy which tired her out.  Community Resources [employer] StraussCommunity Agencies: Other (Comment)(been to [facility] in 2016 after first CVA) Premorbid Home Care/DME Agencies: Other (Comment)(has equipment from other stroke) Transportation available at discharge: Family Resource referrals recommended: Support group (specify)  Discharge Planning Living Arrangements: Children, Other relatives Support Systems: Children, Other relatives, Friends/neighbors, Church/faith community Type of Residence: Private residence Insurance Resources: Media plannerrivate Insurance (specify)(Aetna) Surveyor, quantityinancial Resources: Tree surgeonocial Security, Family Support Living Expenses: Lives with family Money Management: Family Does the patient have any problems obtaining your medications?: No Home Management: Family Patient/Family Preliminary Plans: Return home with daughter and her caregiver who have been providing 24 hr care due to deficits from past stroke. Daughter needs her to be more mobile prior to discharge home. Will await therapy evaluations and work on best plan for pt. Social Work Anticipated Follow Up Needs: HH/OP, Support Group  Clinical Impression Pleasantly confused pt who has been here before after first stroke. Her daughter is very involved and assists with Mom's care. She has a hired caregiver at night. Will work on discharge needs and encourage daughter to be here for therapies. Re-assess pt's need to see neuro-psych due to sleeping throughout my session.  Lucy Chrisupree, Williette Loewe G 08/17/2018, 10:27 AM

## 2018-08-17 NOTE — Progress Notes (Signed)
Leah Figueroa  Subjective/Complaints: Patient seen laying in bed this morning.  Leah Figueroa states Leah Figueroa slept well overnight.  Leah Figueroa notes Leah Figueroa had a good first day of therapies yesterday, but is confused.  ROS: Denies CP, SOB, nausea, vomiting, diarrhea.  Objective: Vital Signs: Blood pressure 133/84, pulse 84, temperature 98.3 F (36.8 C), temperature source Oral, resp. rate 14, SpO2 98 %. No results found. Recent Labs    08/16/18 0507  WBC 7.3  HGB 12.5  HCT 37.4  PLT 226   Recent Labs    08/16/18 0507  NA 140  K 3.2*  CL 106  CO2 29  GLUCOSE 97  BUN 12  CREATININE 0.56  CALCIUM 9.0    Physical Exam: BP 133/84   Pulse 84   Temp 98.3 F (36.8 C) (Oral)   Resp 14   SpO2 98%  Constitutional: No distress . Vital signs reviewed. HENT: Normocephalic.  Atraumatic. Eyes: EOMI. No discharge. Cardiovascular: RRR.  No JVD. Respiratory: CTA bilaterally.  Normal effort. GI: BS +. Non-distended. Musc: No edema or tenderness in extremities. Neurological:  Alert HOH Makes good eye contact with examiner. Leah Figueroa follows simple commands.  Motor: Grossly 4/5 throughout, slightly weaker left upper extremity, unchanged Skin: Skin is warm. Leah Figueroa is not diaphoretic.  Psychiatric: Confused.   Assessment/Plan: 1. Functional deficits secondary to right MCA infarct which require 3+ hours per day of interdisciplinary therapy in a comprehensive inpatient rehab setting.  Physiatrist is providing close team supervision and 24 hour management of active medical problems listed below.  Physiatrist and rehab team continue to assess barriers to discharge/monitor patient progress toward functional and medical goals  Care Tool:  Bathing    Body parts bathed by patient: Chest, Abdomen, Front perineal area, Buttocks, Left arm, Right arm, Face, Left upper leg, Right upper leg   Body parts bathed by helper: Left lower leg, Right lower leg     Bathing  assist Assist Level: Moderate Assistance - Patient 50 - 74%     Upper Body Dressing/Undressing Upper body dressing   What is the patient wearing?: Bra, Pull over shirt    Upper body assist Assist Level: Moderate Assistance - Patient 50 - 74%    Lower Body Dressing/Undressing Lower body dressing      What is the patient wearing?: Incontinence brief, Pants     Lower body assist Assist for lower body dressing: Moderate Assistance - Patient 50 - 74%     Toileting Toileting    Toileting assist Assist for toileting: Moderate Assistance - Patient 50 - 74%     Transfers Chair/bed transfer  Transfers assist     Chair/bed transfer assist level: Minimal Assistance - Patient > 75%     Locomotion Ambulation   Ambulation assist      Assist level: Contact Guard/Touching assist Assistive device: Rollator Max distance: 150'   Walk 10 feet activity   Assist     Assist level: Contact Guard/Touching assist Assistive device: Rollator   Walk 50 feet activity   Assist    Assist level: Contact Guard/Touching assist Assistive device: Rollator    Walk 150 feet activity   Assist    Assist level: Contact Guard/Touching assist Assistive device: Rollator    Walk 10 feet on uneven surface  activity   Assist Walk 10 feet on uneven surfaces activity did not occur: Safety/medical concerns     Assistive device: PhotographerWalker-rolling   Wheelchair     Assist Will patient use  wheelchair at discharge?: Yes(transport w/c)   Wheelchair activity did not occur: Safety/medical concerns         Wheelchair 50 feet with 2 turns activity    Assist    Wheelchair 50 feet with 2 turns activity did not occur: Safety/medical concerns       Wheelchair 150 feet activity     Assist Wheelchair 150 feet activity did not occur: Safety/medical concerns          Medical Problem List and Plan: 1.  Left side weakness secondary to right posterior MCA territory  infarction as well as history of thrombotic stroke involving right MCA artery 2016.  Continue CIR 2.  DVT Prophylaxis/Anticoagulation: subcutaneous Lovenox. Monitor for any bleeding episodes 3. Pain Management:  Tylenol as needed 4. Mood:  Xanax 0.25 mg daily at bedtime 5. Neuropsych: This patient is ?fully capable of making decisions on her own behalf. 6. Skin/Wound Care:  Routine skin checks 7. Fluids/Electrolytes/Nutrition:  Routine in and out's             -encourage PO 8. Hypertension. Lopressor 50 mg twice a day  Labile on 1/2, monitor for trend 9. Hyperlipidemia. Lipitor 10. Hypothyroidism. Synthroid 11. Constipation. Laxative assistance  Improving 12.  Hypokalemia  Potassium 3.2 on 1/1  Supplement initiated x1 day  Labs ordered for tomorrow  Continue to monitor 13.  Hypoalbuminemia  Supplement initiated on 1/1   LOS: 2 days A FACE TO FACE EVALUATION WAS PERFORMED  Azaria Bartell Karis Juba 08/17/2018, 9:17 AM

## 2018-08-17 NOTE — Progress Notes (Signed)
Occupational Therapy Session Note  Patient Details  Name: GITEL NISSLEY MRN: [id number] Date of Birth: [birth year]  Today's Date: 08/17/2018 OT Individual Time: 1000-1100 OT Individual Time Calculation (min): 60 min    Short Term Goals: Week 1:  OT Short Term Goal 1 (Week 1): STGs = LTGs  Skilled Therapeutic Interventions/Progress Updates:    OT intervention with focus on sit<>stand, standing balance, functional amb with Rollator, attention to L, LUE functional use, sitting balance, activity tolerance, and safety awareness to increase independence with BADLs. Pt asleep in recliner upon arrival but easily aroused.  Pt amb with Rollator in room to locate w/c initially placed in her L visual field.  Max verbal cues for patient to scan her envirionment to located w/c and max verbal cues for sequencing when turning to sit in w/c.  Pt transitioned to gym and initially sat EOM.  Pt with significant posterior pelivic and unable to independently maintain sitting balance EOM.  Pt returned to w/c and engaged in table tasks with focus on locating items in her L visual field and retrieving items with her LUE.  Pt with decreased proprioception and sensation in LUE impairing functional use of LUE. Pt requires max verbal cues to scan in L visual field to locate objects and retrieve with RUE.  Pt required min verbal cues for redirection to task. Pt returned to room and amb with Rollator back to recliner.  Pt remained in recliner with belt alarm activated and daughter present. All needs within reach.   Therapy Documentation Precautions:  Precautions Precautions: Fall Precaution Comments: L inattention, very HOH (no hearing aides), baseline dementia Restrictions Weight Bearing Restrictions: No Pain: Pain Assessment Pain Scale: 0-10 Pain Score: 0-No pain   Therapy/Group: Individual Therapy  Rich Brave 08/17/2018, 12:27 PM

## 2018-08-18 ENCOUNTER — Inpatient Hospital Stay (HOSPITAL_COMMUNITY): Payer: Medicare Other

## 2018-08-18 ENCOUNTER — Inpatient Hospital Stay (HOSPITAL_COMMUNITY): Payer: Medicare Other | Admitting: Physical Therapy

## 2018-08-18 LAB — BASIC METABOLIC PANEL
Anion gap: 8 (ref 5–15)
BUN: 16 mg/dL (ref 8–23)
CO2: 27 mmol/L (ref 22–32)
Calcium: 9.1 mg/dL (ref 8.9–10.3)
Chloride: 106 mmol/L (ref 98–111)
Creatinine, Ser: 0.58 mg/dL (ref 0.44–1.00)
GFR calc Af Amer: >60 mL/min (ref >60)
Glucose, Bld: 105 mg/dL — ABNORMAL HIGH (ref 70–99)
Potassium: 3.9 mmol/L (ref 3.5–5.1)
Sodium: 141 mmol/L (ref 135–145)

## 2018-08-18 NOTE — Progress Notes (Signed)
[place] PHYSICAL MEDICINE & REHABILITATION PROGRESS NOTE  Subjective/Complaints: Pt does not recognize me from prior office visits In therapy sitting at EOB  ROS: Denies CP, SOB, nausea, vomiting, diarrhea.  Objective: Vital Signs: Blood pressure (!) 148/64, pulse 60, temperature 97.8 F (36.6 C), temperature source Oral, resp. rate 15, SpO2 96 %. No results found. Recent Labs    08/16/18 0507  WBC 7.3  HGB 12.5  HCT 37.4  PLT 226   Recent Labs    08/16/18 0507 08/18/18 0628  NA 140 141  K 3.2* 3.9  CL 106 106  CO2 29 27  GLUCOSE 97 105*  BUN 12 16  CREATININE 0.56 0.58  CALCIUM 9.0 9.1    Physical Exam: BP (!) 148/64   Pulse 60   Temp 97.8 F (36.6 C) (Oral)   Resp 15   SpO2 96%  Constitutional: No distress . Vital signs reviewed. HENT: Normocephalic.  Atraumatic. Eyes: EOMI. No discharge. Cardiovascular: RRR.  No JVD. Respiratory: CTA bilaterally.  Normal effort. GI: BS +. Non-distended. Musc: No edema or tenderness in extremities. Neurological:  Alert HOH Makes good eye contact with examiner. She follows simple commands.  Motor: Grossly 4/5 throughout, slightly weaker left upper extremity,no change since yesterday Skin: Skin is warm. She is not diaphoretic.  Psychiatric: Confused.   Assessment/Plan: 1. Functional deficits secondary to right MCA infarct which require 3+ hours per day of interdisciplinary therapy in a comprehensive inpatient rehab setting.  Physiatrist is providing close team supervision and 24 hour management of active medical problems listed below.  Physiatrist and rehab team continue to assess barriers to discharge/monitor patient progress toward functional and medical goals  Care Tool:  Bathing    Body parts bathed by patient: Chest, Abdomen, Front perineal area, Buttocks, Left arm, Right arm, Face, Left upper leg, Right upper leg, Right lower leg, Left lower leg   Body parts bathed by helper: Left lower leg, Right lower  leg     Bathing assist Assist Level: Contact Guard/Touching assist     Upper Body Dressing/Undressing Upper body dressing   What is the patient wearing?: Bra, Pull over shirt    Upper body assist Assist Level: Moderate Assistance - Patient 50 - 74%    Lower Body Dressing/Undressing Lower body dressing      What is the patient wearing?: Pants, Incontinence brief     Lower body assist Assist for lower body dressing: Moderate Assistance - Patient 50 - 74%     Toileting Toileting    Toileting assist Assist for toileting: Independent with assistive device Assistive Device Comment: (Rolling walker)   Transfers Chair/bed transfer  Transfers assist     Chair/bed transfer assist level: Contact Guard/Touching assist     Locomotion Ambulation   Ambulation assist      Assist level: Independent with assistive device Assistive device: Walker-rolling Max distance: 150'   Walk 10 feet activity   Assist     Assist level: Contact Guard/Touching assist Assistive device: Walker-rolling   Walk 50 feet activity   Assist    Assist level: Contact Guard/Touching assist Assistive device: Walker-rolling    Walk 150 feet activity   Assist    Assist level: Contact Guard/Touching assist Assistive device: Walker-rolling    Walk 10 feet on uneven surface  activity   Assist Walk 10 feet on uneven surfaces activity did not occur: Safety/medical concerns     Assistive device: Photographer Will patient use wheelchair  at discharge?: Yes(transport w/c)   Wheelchair activity did not occur: Safety/medical concerns         Wheelchair 50 feet with 2 turns activity    Assist    Wheelchair 50 feet with 2 turns activity did not occur: Safety/medical concerns       Wheelchair 150 feet activity     Assist Wheelchair 150 feet activity did not occur: Safety/medical concerns          Medical Problem List and Plan: 1.   Left side weakness secondary to right posterior MCA territory infarction as well as history of thrombotic stroke involving right MCA artery 2016.  Continue CIR PT, OT, SLP, ELOS 1/8 2.  DVT Prophylaxis/Anticoagulation: subcutaneous Lovenox. Monitor for any bleeding episodes 3. Pain Management:  Tylenol as needed 4. Mood:  Xanax 0.25 mg daily at bedtime 5. Neuropsych: This patient is ?fully capable of making decisions on her own behalf. 6. Skin/Wound Care:  Routine skin checks 7. Fluids/Electrolytes/Nutrition:  Routine in and out's             -encourage PO 8. Hypertension. Lopressor 50 mg twice a day  Labile on 1/2, monitor for trend 9. Hyperlipidemia. Lipitor 10. Hypothyroidism. Synthroid 11. Constipation. Laxative assistance  Improving 12.  Hypokalemia  Potassium 3.2 on 1/1  Improved to 3.9 on 1/3  Continue to monitor 13.  Hypoalbuminemia  Supplement initiated on 1/1   LOS: 3 days A FACE TO FACE EVALUATION WAS PERFORMED  Erick Colacendrew E Kirsteins 08/18/2018, 6:02 PM

## 2018-08-18 NOTE — Progress Notes (Signed)
Occupational Therapy Session Note  Patient Details  Name: Leah Figueroa MRN: [id number] Date of Birth: 1922-12-19  Today's Date: 08/18/2018 OT Individual Time: 1100-1200 OT Individual Time Calculation (min): 60 min    Short Term Goals: Week 1:  OT Short Term Goal 1 (Week 1): STGs = LTGs  Skilled Therapeutic Interventions/Progress Updates:    Session focused on b/d tasks and L UE ataxia. Pt completed functional mobility into bathroom with mod sequencing cues provided. Pt doffed all clothing with min A to remove distally. Pt washed all UB/LB with cueing only. Pt completed transfer out of shower with max cueing for sequencing UE/LE placement. Pt donned shirt and pants with mod A. Frequent tactile cues and manual facilitation to correct L UE ataxia. Pt was transported to dynavision room, where she completed functional reaching with her L UE. A weight was placed on her L wrist to trial increasing proprioceptive input. Pt's performance on dynavision was much worse with this weight and it was removed. Pt was returned to her room where she returned to recliner and was left with her daughter present, chair alarm belt fastened, and all needs met.   Therapy Documentation Precautions:  Precautions Precautions: Fall Precaution Comments: L inattention, very HOH (no hearing aides), baseline dementia Restrictions Weight Bearing Restrictions: No Vital Signs: Therapy Vitals Temp: 97.8 F (36.6 C) Temp Source: Oral Pulse Rate: 64 Resp: 15 BP: (!) 152/70 Patient Position (if appropriate): Lying Oxygen Therapy SpO2: 96 % O2 Device: Room Air Pain:  No pain reported throughout session   Therapy/Group: Individual Therapy  Curtis Sites 08/18/2018, 7:25 AM

## 2018-08-18 NOTE — Progress Notes (Signed)
Physical Therapy Session Note  Patient Details  Name: Leah Figueroa MRN: [id number] Date of Birth: 1922-09-03  Today's Date: 08/18/2018 PT Individual Time: 0900-0952 AND 1435-1535 PT Individual Time Calculation (min): 52 min AND 60 min  Short Term Goals: Week 1:  PT Short Term Goal 1 (Week 1): STG = LTG due to short ELOS.  Skilled Therapeutic Interventions/Progress Updates:   Session 1:  Pt in supine and agreeable to therapy, no c/o pain. Session focused on functional ambulation and balance. Supine>sit w/ supervision. Ambulated around unit w/ CGA using rollator and RW intermittently. She continues to need manual and verbal cues for L environmental awareness and obstacle avoidance w/ either AD. Will continue to work towards LRAD for d/c. Worked on functional balance and visual scanning while performing card matching task in stance. Min tactile cues for ankle balance strategies and CGA-min assist for safety w/o UE support while standing. Pt w/ posterior lean bias. Mod verbal and visual cues for task completion and cues to scan to L side of board. Returned to room and assisted w/ toilet transfer, min assist. Ended session in recliner, all needs in reach.   Session 2:  Pt in recliner and agreeable to therapy, no c/o pain. Session focused on functional mobility and endurance. Ambulated to/from toilet w/ min assist for LE garment management. Daughter present and confirms that pt is almost at her baseline, she states the only differences are her L inattention and impaired LUE motor control. She does confirm that she did not need someone right by her all the time PTA. Provided skilled cues while pt ate lunch to attend to L environment and for attention to task. Educated daughter on functional sequencing deficits, L inattention, and cues to encourage increased eating and appetite. Pt ate entire bowl of soup w/ max encouragement. Wrote down pt's dietary preferences and posted in room as she is unable to state  her preferences when staff takes her order. Worked on functional ambulation w/ rollator in busier environment, mod cues for obstacle avoidance, L environmental awareness, and LUE management on rollator. NuStep 5 min @ level 1 for LE strengthening and endurance training. LEs only w/ improved ability to perform than when using UEs during previous sessions. Pt frequently asking to lay down towards end of session. Returned to room and ended session in supine, all needs in reach. Missed 15 min of skilled PT.   Therapy Documentation Precautions:  Precautions Precautions: Fall Precaution Comments: L inattention, very HOH (no hearing aides), baseline dementia Restrictions Weight Bearing Restrictions: No Vital Signs: Therapy Vitals Pulse Rate: 60 BP: (!) 148/64  Therapy/Group: Individual Therapy  Hulet Ehrmann Melton Krebs 08/18/2018, 9:55 AM

## 2018-08-18 NOTE — Patient Care Conference (Signed)
Inpatient RehabilitationTeam Conference and Plan of Care Update Date: 08/18/2018   Time: 10:00 AM    Patient Name: Leah Figueroa      Medical Record Number: [id number]  Date of Birth: 23-Jul-1923 Sex: Female         Room/Bed: 4W25C/4W25C-01 Payor Info: Payor: MEDICARE / Plan: MEDICARE PART A AND B / Product Type: *No Product type* /    Admitting Diagnosis: cva  Admit Date/Time:  08/15/2018  2:35 PM Admission Comments: No comment available   Primary Diagnosis:  <principal problem not specified> Principal Problem: <principal problem not specified>  Patient Active Problem List   Diagnosis Date Noted  . Hypoalbuminemia due to protein-calorie malnutrition (HCC)   . Hypokalemia   . Slow transit constipation   . Bilateral hearing loss   . Right middle cerebral artery stroke (HCC) 08/15/2018  . Pressure injury of skin 08/14/2018  . Cerebrovascular accident (CVA) (HCC)   . Diastolic dysfunction   . History of CVA (cerebrovascular accident)   . Dementia without behavioral disturbance (HCC)   . Tachypnea   . Acute right MCA stroke (HCC) 08/11/2018  . Abdominal pain 08/11/2018  . Hematochezia 05/15/2017  . Coronary artery disease 05/15/2017  . Other and unspecified hyperlipidemia 05/15/2017  . Hypothyroidism 05/15/2017  . HTN (hypertension) 05/15/2017  . TIA (transient ischemic attack) 05/02/2017  . History of stroke 03/07/2017  . Gait abnormality 03/07/2017  . Subacromial impingement of left shoulder 02/09/2016  . Hemiparesis affecting left side as late effect of stroke (HCC) 01/20/2016  . Vertigo 09/03/2015  . Dehydration 09/03/2015  . Acute lower UTI 09/03/2015  . Prolonged Q-T interval on ECG 09/03/2015  . Left-sided neglect 07/01/2015  . History of right MCA stroke 07/01/2015  . Altered sensation due to stroke 04/29/2015  . Cognitive deficit, post-stroke 04/29/2015  . Pseudogout of left wrist 03/28/2015  . Hyperlipidemia LDL goal <70 03/26/2015  . Hyperglycemia 03/26/2015  .  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 Embolic stroke (HCC)   . Left hemiplegia (HCC)   . Homonymous hemianopia   . Chest wall trauma 01/11/2014  . Injury of left lower arm 01/11/2014  . Headache(784.0) 01/04/2013  . Allergic rhinitis, cause unspecified 01/04/2013  . Anemia 04/19/2012  . Epistaxis, recurrent 04/19/2012  . Hypothyroidism 01/01/2008  . Essential hypertension 01/01/2008  . HYPERTENSION 01/01/2008  . MYOCARDIAL INFARCTION, ACUTE, ANTERIOR WALL 01/01/2008  . LEG CRAMPS 01/01/2008  . ADENOCARCINOMA, BREAST, HX OF 01/01/2008  . DIVERTICULOSIS, COLON, HX OF 01/01/2008  . ARTHRITIS, HX OF 01/01/2008  . RADIATION THERAPY, HX OF 01/01/2008  . CATARACT EXTRACTION, HX OF 01/01/2008  . CHOLECYSTECTOMY, LAPAROSCOPIC, HX OF 01/01/2008    Expected Discharge Date: Expected Discharge Date: 08/23/18  Team Members Present: Physician leading conference: Dr. Faith Rogue Social Worker Present: Dossie Der, LCSW Nurse Present: Ronny Bacon, RN PT Present: Karolee Stamps, PT OT Present: Roney Mans, OT PPS Coordinator present : Fae Pippin     Current Status/Progress Goal Weekly Team Focus  Medical   right mca infarct with left hemiparesis, htn,   improve functional use of left side  bp control, stroke education, nutrition   Bowel/Bladder        cont  B & B     Swallow/Nutrition/ Hydration             ADL's   min/mod A for BADLs; min A for functional amb with rollator; absent LUE sensation/proprioception, max verbal cues for scanning strategies to compensate for  L visual field cut  supervision overall  L inattention, safety education, BADL retraining, standing balance, family education, activity tolerance   Mobility   min assist to CGA overall, gait w/ rollator >150'   24/7 supervision, limited community ambulator  L inattention, environmental and safety awareness, functional balance, endurance   Communication              Safety/Cognition/ Behavioral Observations       needs max cues     Pain        no issues     Skin        monitor skin no issues currenlty        *See Care Plan and progress notes for long and short-term goals.     Barriers to Discharge  Current Status/Progress Possible Resolutions Date Resolved   Physician    Medical stability        see medical progress notes      Nursing                  PT  Other (comments)  baseline cognitive deficits  pt's daughter stays with her during day, has aide at night            OT                  SLP                SW                Discharge Planning/Teaching Needs:    Daughter has been here daily and watching pt in therapies. Have hired 24 hr care see what the plan is and what is most comfortable for daughter      Team Discussion:  Goals supervision level with max cues. Left visual field cut. No speech needed. Medically no issues watching BP and adjusting meds. See what daughter plans to do at DC.  Revisions to Treatment Plan:  DC 1/8    Continued Need for Acute Rehabilitation Level of Care: The patient requires daily medical management by a physician with specialized training in physical medicine and rehabilitation for the following conditions: Daily direction of a multidisciplinary physical rehabilitation program to ensure safe treatment while eliciting the highest outcome that is of practical value to the patient.: Yes Daily medical management of patient stability for increased activity during participation in an intensive rehabilitation regime.: Yes Daily analysis of laboratory values and/or radiology reports with any subsequent need for medication adjustment of medical intervention for : Blood pressure problems;Neurological problems   I attest that I was present, lead the team conference, and concur with the assessment and plan of the team.   Lucy Chris 08/18/2018, 12:07 PM

## 2018-08-18 NOTE — Progress Notes (Signed)
Social Work Patient ID: Leah Figueroa, female   DOB: 08/01/1923, 85 y.o.   MRN: [id number] Met with pt and daughter to discuss team conference goals supervision with multiple cues and target discharge date 1/8. Daughter feels they can manage at home and is just questioning if Mom knows when she needs to go to the bathroom or not. She is here daily and participates in therapies with Mom. Work toward discharge needs.  

## 2018-08-19 ENCOUNTER — Inpatient Hospital Stay (HOSPITAL_COMMUNITY): Payer: Medicare Other | Admitting: Occupational Therapy

## 2018-08-19 ENCOUNTER — Inpatient Hospital Stay (HOSPITAL_COMMUNITY): Payer: Medicare Other | Admitting: Physical Therapy

## 2018-08-19 ENCOUNTER — Inpatient Hospital Stay (HOSPITAL_COMMUNITY): Payer: Medicare Other

## 2018-08-19 NOTE — Progress Notes (Signed)
Occupational Therapy Session Note  Patient Details  Name: Leah Figueroa MRN: [id number] Date of Birth: July 16, 1923  Today's Date: 08/19/2018 OT Individual Time: 4920-1007 OT Individual Time Calculation (min): 45 min    Skilled Therapeutic Interventions/Progress Updates: Patient disoriented to place and situation but knew the year.    Initially due to disorientation and confusion and hard of hearing, time was spent building therapeutic rapport and orienting patient to situation.   Otherwise, she declined bathing and changing clothes but concurred to complete toilet transfer (rollator to toilet and then rollator to recliner chair) with CGA; toileitng with moderate assistance due to left LE and UE decreased strength, awareness, and decreased dynamic standing balance; oral care with setup;   Washing of periarea and donning of brief with moderate assistance  Also participated in bilateral hand skills to increase awareness, coordination, strength and use of left upper extremity  At end of session, patient was left with feet elevated in recliner with safety belt in place and call bell within reach.  She demonstrated x 3 on separate occasions how to use the call bell for assistance or for help.     Therapy Documentation Precautions:  Precautions Precautions: Fall Precaution Comments: L inattention, very HOH (no hearing aides), baseline dementia Restrictions Weight Bearing Restrictions: No   Pain: Pain Assessment Pain Scale: 0-10 Pain Score: 0-No pain  Vision/Perception  Left inattention/possible field cut   Therapy/Group: Individual Therapy  Bud Face [facility] 08/19/2018, 12:38 PM

## 2018-08-19 NOTE — Progress Notes (Signed)
Physical Therapy Session Note  Patient Details  Name: Leah Figueroa MRN: [id number] Date of Birth: 07-17-1923  Today's Date: 08/19/2018 PT Individual Time: 1005-1059 PT Individual Time Calculation (min): 54 min   Short Term Goals: Week 1:  PT Short Term Goal 1 (Week 1): STG = LTG due to short ELOS.  Skilled Therapeutic Interventions/Progress Updates:   Pt in recliner and agreeable to therapy, no c/o pain. Requesting to toilet. Ambulated to/from toilet w/ close supervision using rollator, max cues to find bathroom in hospital room. Min assist for LE garment management while toileting and continent of bowel and bladder. Ambulated around unit w/ primarily close supervision, occasional verbal and manual cues for rollator management w/ way-finding and obstacle avoidance on L side. Worked on obstacle negotiation and problem solving during gait w/ rollator. Verbal cues for problem solving when rollator was hitting an obstacle. Worked on visual scanning of environment while locating cups at eye level on both L and R sides. Needed mod verbal/visual cues for successful location of cups. Dynavision from seated level to work on L inattention. Average reaction time w/ LUE was 31.4 sec, average w/ RUE was 12.73 sec. L visual field 17.11 sec and R visual field 8.56 sec, when using RUE. Returned to room and ended session in recliner, all needs in reach.   Therapy Documentation Precautions:  Precautions Precautions: Fall Precaution Comments: L inattention, very HOH (no hearing aides), baseline dementia Restrictions Weight Bearing Restrictions: No Vital Signs:  Pain: Pain Assessment Pain Scale: 0-10 Pain Score: 0-No pain  Therapy/Group: Individual Therapy  Lechelle Wrigley K Skylier Kretschmer 08/19/2018, 11:01 AM

## 2018-08-19 NOTE — Plan of Care (Signed)
  Problem: Consults Goal: RH STROKE PATIENT EDUCATION Description See Patient Education module for education specifics  Outcome: Progressing   Problem: RH BOWEL ELIMINATION Goal: RH STG MANAGE BOWEL WITH ASSISTANCE Description STG Manage Bowel with mod I Assistance.  Outcome: Progressing Goal: RH STG MANAGE BOWEL W/MEDICATION W/ASSISTANCE Description STG Manage Bowel with Medication with mod I Assistance.  Outcome: Progressing   Problem: RH BLADDER ELIMINATION Goal: RH STG MANAGE BLADDER WITH ASSISTANCE Description STG Manage Bladder With min Assistance  Outcome: Progressing   Problem: RH SKIN INTEGRITY Goal: RH STG SKIN FREE OF INFECTION/BREAKDOWN Outcome: Progressing Goal: RH STG MAINTAIN SKIN INTEGRITY WITH ASSISTANCE Description STG Maintain Skin Integrity With min Assistance.  Outcome: Progressing   Problem: RH SAFETY Goal: RH STG ADHERE TO SAFETY PRECAUTIONS W/ASSISTANCE/DEVICE Description STG Adhere to Safety Precautions With min Assistance/Device.  Outcome: Progressing   Problem: RH PAIN MANAGEMENT Goal: RH STG PAIN MANAGED AT OR BELOW PT'S PAIN GOAL Description Less than 3 out of 10  Outcome: Progressing   Problem: RH Vision Goal: RH LTG Vision (Specify) Outcome: Progressing

## 2018-08-19 NOTE — Progress Notes (Signed)
Occupational Therapy Session Note  Patient Details  Name: Leah Figueroa MRN: [id number] Date of Birth: 1923/05/09  Today's Date: 08/19/2018 OT Individual Time: 1300-1405 OT Individual Time Calculation (min): 65 min  and Today's Date: 08/19/2018 OT Missed Time: 25 Minutes Missed Time Reason: Patient fatigue   Short Term Goals: Week 1:  OT Short Term Goal 1 (Week 1): STGs = LTGs  Skilled Therapeutic Interventions/Progress Updates:    Pt resting in recliner upon arrival with daughter present.  OT intervention with focus on functional amb with rollator, attend to L, LUE function, following one step commands, safety awareness, and activity tolerance to increase independence with BADLs. Pt amb with RW to day room with max verbal cues for safety to avoid obstacles in hallway and when turning corners.  Attempted to engage pt on Nustep with no success after 5 mins.  Pt amb with rollator to gym and attempted to engage pt in BUE tasks but pt does not attend to her LUE.  Pt amb in hallway to gather colored cups.  Pt requires max verbal cues to scan envirionment.  Pt continually walked up against wall on L with no awarness.  At one time, pt heard the scraping sound of rollator against wall and stated that she thought the wheels needed oiling. Pt became fatigued, requiring increased number of rest breaks on rollator, and returned to room.  Pt remained in recliner with belt alarm activated and all needs within reach.   Therapy Documentation Precautions:  Precautions Precautions: Fall Precaution Comments: L inattention, very HOH (no hearing aides), baseline dementia Restrictions Weight Bearing Restrictions: No General: General OT Amount of Missed Time: 25 Minutes   Pain:  Pt denies pain   Therapy/Group: Individual Therapy  Rich BraveLanier, Roddy Bellamy Chappell 08/19/2018, 2:09 PM

## 2018-08-19 NOTE — Progress Notes (Signed)
[facility] PHYSICAL MEDICINE & REHABILITATION PROGRESS NOTE  Subjective/Complaints: Remembers face not name from yesterday, no c/o  ROS: Denies CP, SOB, nausea, vomiting, diarrhea.  Objective: Vital Signs: Blood pressure (!) 141/82, pulse 68, temperature 97.7 F (36.5 C), temperature source Oral, resp. rate (!) 21, SpO2 95 %. No results found. No results for input(s): WBC, HGB, HCT, PLT in the last 72 hours. Recent Labs    08/18/18 0628  NA 141  K 3.9  CL 106  CO2 27  GLUCOSE 105*  BUN 16  CREATININE 0.58  CALCIUM 9.1    Physical Exam: BP (!) 141/82 (BP Location: Right Arm) Comment: rn notified  Pulse 68   Temp 97.7 F (36.5 C) (Oral)   Resp (!) 21 Comment: rn notified  SpO2 95%  Constitutional: No distress . Vital signs reviewed. HENT: Normocephalic.  Atraumatic. Eyes: EOMI. No discharge. Cardiovascular: RRR.  No JVD. Respiratory: CTA bilaterally.  Normal effort. GI: BS +. Non-distended. Musc: No edema or tenderness in extremities. Neurological:  Alert HOH Makes good eye contact with examiner. She follows simple commands.  Sensation intact to LT LUE Motor: Grossly 4/5 throughout, slightly weaker left upper extremity,decreased motor control in LUE >LLESkin: Skin is warm. She is not diaphoretic.  Psychiatric: Confused.   Assessment/Plan: 1. Functional deficits secondary to right MCA infarct which require 3+ hours per day of interdisciplinary therapy in a comprehensive inpatient rehab setting.  Physiatrist is providing close team supervision and 24 hour management of active medical problems listed below.  Physiatrist and rehab team continue to assess barriers to discharge/monitor patient progress toward functional and medical goals  Care Tool:  Bathing    Body parts bathed by patient: Chest, Abdomen, Front perineal area, Buttocks, Left arm, Right arm, Face, Left upper leg, Right upper leg, Right lower leg, Left lower leg   Body parts bathed by helper: Left  lower leg, Right lower leg     Bathing assist Assist Level: Contact Guard/Touching assist     Upper Body Dressing/Undressing Upper body dressing   What is the patient wearing?: Bra, Pull over shirt    Upper body assist Assist Level: Moderate Assistance - Patient 50 - 74%    Lower Body Dressing/Undressing Lower body dressing      What is the patient wearing?: Pants, Incontinence brief     Lower body assist Assist for lower body dressing: Moderate Assistance - Patient 50 - 74%     Toileting Toileting    Toileting assist Assist for toileting: Independent with assistive device Assistive Device Comment: (Rolling walker)   Transfers Chair/bed transfer  Transfers assist     Chair/bed transfer assist level: Contact Guard/Touching assist     Locomotion Ambulation   Ambulation assist      Assist level: Independent with assistive device Assistive device: Walker-rolling Max distance: 150'   Walk 10 feet activity   Assist     Assist level: Contact Guard/Touching assist Assistive device: Walker-rolling   Walk 50 feet activity   Assist    Assist level: Contact Guard/Touching assist Assistive device: Walker-rolling    Walk 150 feet activity   Assist    Assist level: Contact Guard/Touching assist Assistive device: Walker-rolling    Walk 10 feet on uneven surface  activity   Assist Walk 10 feet on uneven surfaces activity did not occur: Safety/medical concerns     Assistive device: Photographer Will patient use wheelchair at discharge?: Yes(transport w/c)   Wheelchair activity  did not occur: Safety/medical concerns         Wheelchair 50 feet with 2 turns activity    Assist    Wheelchair 50 feet with 2 turns activity did not occur: Safety/medical concerns       Wheelchair 150 feet activity     Assist Wheelchair 150 feet activity did not occur: Safety/medical concerns          Medical  Problem List and Plan: 1.  Left side weakness secondary to right posterior MCA territory infarction as well as history of thrombotic stroke involving right MCA artery 2016.  Continue CIR PT, OT, SLP, ELOS 1/8 2.  DVT Prophylaxis/Anticoagulation: subcutaneous Lovenox. Monitor for any bleeding episodes 3. Pain Management:  Tylenol as needed 4. Mood:  Xanax 0.25 mg daily at bedtime 5. Neuropsych: This patient is ?fully capable of making decisions on her own behalf. 6. Skin/Wound Care:  Routine skin checks 7. Fluids/Electrolytes/Nutrition:  Routine in and out's I yesterday             -encourage PO 8. Hypertension. Lopressor 50 mg twice a day   Vitals:   08/18/18 2136 08/19/18 0646  BP: (!) 119/58 (!) 141/82  Pulse: 67 68  Resp: 15 (!) 21  Temp: 98.1 F (36.7 C) 97.7 F (36.5 C)  SpO2: 93% 95%   9. Hyperlipidemia. Lipitor 10. Hypothyroidism. Synthroid 11. Constipation. Laxative assistance  Improving 12.  Hypokalemia  Potassium 3.2 on 1/1  Improved to 3.9 on 1/3  Continue to monitor 13.  Hypoalbuminemia  Supplement initiated on 1/1   LOS: 4 days A FACE TO FACE EVALUATION WAS PERFORMED  Erick Colace 08/19/2018, 10:05 AM

## 2018-08-20 ENCOUNTER — Inpatient Hospital Stay (HOSPITAL_COMMUNITY): Payer: Medicare Other | Admitting: Occupational Therapy

## 2018-08-20 NOTE — Progress Notes (Signed)
Occupational Therapy Session Note  Patient Details  Name: Leah Figueroa MRN: [id number] Date of Birth: [birth year]  Today's Date: 08/20/2018 OT Group Time: 1100-1200 OT Group Time Calculation (min): 60 min  Skilled Therapeutic Interventions/Progress Updates:    Pt engaged in therapeutic w/c level dance group focusing on patient choice, UE/LE strengthening, salience, activity tolerance, and social participation. Pt was guided through various dance-based exercises involving UEs/LEs and trunk. All music was selected by group members. Emphasis placed on L UE NMR and Lt attention. OT sat beside her on Lt side. Pt required max multimodal cues to engage in dance exercises. She often initiated leaning and looking to Lt side to speak with OT. HOH for hand holding for social participation. At end of session NT returned pt to room.     Therapy Documentation Precautions:  Precautions Precautions: Fall Precaution Comments: L inattention, very HOH (no hearing aides), baseline dementia Restrictions Weight Bearing Restrictions: No Pain: No s/s pain during session    ADL: ADL Eating: Set up Grooming: Minimal assistance Where Assessed-Grooming: Sitting at sink Upper Body Bathing: Minimal assistance Where Assessed-Upper Body Bathing: Sitting at sink Lower Body Bathing: Moderate assistance Where Assessed-Lower Body Bathing: Sitting at sink Upper Body Dressing: Moderate assistance Where Assessed-Upper Body Dressing: Sitting at sink Lower Body Dressing: Moderate assistance Where Assessed-Lower Body Dressing: Sitting at sink Toileting: Moderate assistance Where Assessed-Toileting: Teacher, adult education: Curator Method: Proofreader: Grab bars      Therapy/Group: Group Therapy  Coulton Schlink A Marilou Barnfield 08/20/2018, 12:54 PM

## 2018-08-21 ENCOUNTER — Inpatient Hospital Stay (HOSPITAL_COMMUNITY): Payer: Medicare Other

## 2018-08-21 MED ORDER — GERHARDT'S BUTT CREAM
TOPICAL_CREAM | Freq: Two times a day (BID) | CUTANEOUS | Status: DC
  Administered 2018-08-21 – 2018-08-23 (×4): via TOPICAL
  Filled 2018-08-21: qty 1

## 2018-08-21 MED ORDER — MUPIROCIN 2 % EX OINT
TOPICAL_OINTMENT | Freq: Every day | CUTANEOUS | Status: DC
  Administered 2018-08-21 – 2018-08-23 (×3): via TOPICAL
  Filled 2018-08-21: qty 22

## 2018-08-21 NOTE — Progress Notes (Signed)
Pt has foam to left hand with skin tear and foam to LLE that was described as a skin tear previously in charting.  Foam has yellow/brown thick foul smelling drainage, notify Dan PA, states to cleanse with NS and report findings, wound cleansed with NS, painful with wiping, wound appears more as a blister that is open with raw pink/red tissue and painful to touch.  2 areas are 5 cm long and 3 cm wide. Dan notified, state to order wound consult.  Consult to [facility] nurse ordered.

## 2018-08-21 NOTE — Progress Notes (Signed)
Occupational Therapy Session Note  Patient Details  Name: Leah Figueroa MRN: [id number] Date of Birth: 02-May-1923  Today's Date: 08/21/2018 OT Individual Time: 0900-1000 OT Individual Time Calculation (min): 60 min    Short Term Goals: Week 1:  OT Short Term Goal 1 (Week 1): STGs = LTGs  Skilled Therapeutic Interventions/Progress Updates:    Pt received sitting up in w/c with no c/o pain, NT present. Pt was set up to complete bathing at sink in w/c. With manual facilitation for set up, pt able to bimanually ring out washcloth and wash UB, min A to reach under R UE. Pt stood and completed peri cleansing with CGA for balance support. Pt completed functional mobility into bathroom for urgent urinary needs. Pt completed all toileting tasks with CGA. Mod A required to don pants and shirt. Increased time was provided to allow pt to attempt and problem solve through tasks. Pt had a small skin tear on her L hand, RN notified and bandaged.  Pt still presenting with significant L ataxia in her UE, she is unable to tell when she is holding or grasping an object. Pt was brought down to therapy gym where she completed grasp/release activity with her L UE to improve volitional control. Pt was returned to her room and she completed SPT to bed with CGA. Bed alarm was set and all needs met.   Therapy Documentation Precautions:  Precautions Precautions: Fall Precaution Comments: L inattention, very HOH (no hearing aides), baseline dementia Restrictions Weight Bearing Restrictions: No Vital Signs: Therapy Vitals Temp: 98.7 F (37.1 C) Temp Source: Oral Pulse Rate: (!) 59 Resp: 18 BP: 125/89 Patient Position (if appropriate): Lying Oxygen Therapy SpO2: 97 % O2 Device: Room Air Pain:  No pain reported throughout session   Therapy/Group: Individual Therapy  Curtis Sites 08/21/2018, 7:23 AM

## 2018-08-21 NOTE — Progress Notes (Signed)
Physical Therapy Session Note  Patient Details  Name: Leah Figueroa MRN: [id number] Date of Birth: November 19, 1922  Today's Date: 08/21/2018 PT Individual Time: 1884-1660 and 6301-6010 PT Individual Time Calculation (min): 58 min and 68 minutes   Short Term Goals: Week 1:  PT Short Term Goal 1 (Week 1): STG = LTG due to short ELOS.  Skilled Therapeutic Interventions/Progress Updates:   Session 1:  Pt sitting up in bed eating breakfast upon PT arrival, agreeable to therapy tx and denies pain. Pt transferred to sitting with min assist and donned shoes. Min assist for sit>stand from bed with rollator, ambulated to the rehab apartment x 250 ft with supervision and rollator, verbal cues for attention to L. Pt ambulated within apartment and performed furniture transfers with supervision, verbal cues for obstacle avoidance. Pt ambulated to gym x 100 ft with supervision and rollator. Pt worked on L attention this session to complete bean bag sorting task and card matching activity, max verbal cueing. Pt worked on standing balance without UE support to participate in horseshoe toss activity, x 1 trial standing on level surface and x 1 trial standing on airex, min-mod assist for balance and pt with posterior lean when standing on airex. Pt ambulated back to room with supervision and rollator x 200 ft, ambulated into bathroom and performed all toileting with supervision/CGA, ambulated to sink and washed hands with verbal cueing. Pt left seated in w/c with chair alarm set and needs in reach.   Session 2: Pt supine in bed upon PT arrival, agreeable to therapy tx and denies pain. Pt transferred to sitting with supervision and donned shoes. Supervision sit<>stand with rollator and ambulated to the gym x 200 ft. Pt ambulated to ortho gym<>gym x 150 ft each direction with rollator and supervision, pt performed car transfer this session with min guard assist and verbal cueing for techniques. Pt performed seated UE exercises  for strengthening with 1# dowel 2 x 10 bicep curls and shoulder flexion. Pt worked on L attention and L UE NMR while ambulating in hallway with rollator to collect green cups on L side, verbal cueing to locate cups. Pt ambulated to rehab kitchen in the apartment with rollator and supervision, max cueing to find the pantry and max cueing to locate items, name items and name prices listed on items. Pt worked on B UE coordination to participate in seated ball toss activity, verbal cues for attention to left side. Pt ambulated to the dayroom with rollator and supervision. Pt transferred onto cybex kinetron with min assist, pt used kinetron 2 x 3 minutes on 40cm/sec working on LE strength and endurance. Pt ambulated back to room and transferred to bed, left supine with needs in reach and bed alarm set.   Therapy Documentation Precautions:  Precautions Precautions: Fall Precaution Comments: L inattention, very HOH (no hearing aides), baseline dementia Restrictions Weight Bearing Restrictions: No    Therapy/Group: Individual Therapy  Cresenciano Genre, PT, DPT 08/21/2018, 7:48 AM

## 2018-08-21 NOTE — Progress Notes (Signed)
[place] PHYSICAL MEDICINE & REHABILITATION PROGRESS NOTE  Subjective/Complaints: No issues overnite, just completed PT  ROS: Denies CP, SOB, nausea, vomiting, diarrhea.  Objective: Vital Signs: Blood pressure 125/89, pulse 88, temperature 98.7 F (37.1 C), temperature source Oral, resp. rate 18, SpO2 97 %. No results found. No results for input(s): WBC, HGB, HCT, PLT in the last 72 hours. No results for input(s): NA, K, CL, CO2, GLUCOSE, BUN, CREATININE, CALCIUM in the last 72 hours.  Physical Exam: BP 125/89 (BP Location: Right Arm)   Pulse 88   Temp 98.7 F (37.1 C) (Oral)   Resp 18   SpO2 97%  Constitutional: No distress . Vital signs reviewed. HENT: Normocephalic.  Atraumatic. Eyes: EOMI. No discharge. Cardiovascular: RRR.  No JVD. Respiratory: CTA bilaterally.  Normal effort. GI: BS +. Non-distended. Musc: No edema or tenderness in extremities. Neurological:  Alert HOH Makes good eye contact with examiner. She follows simple commands.  Sensation intact to LT LUE Motor: Grossly 4/5 throughout, slightly weaker left upper extremity,decreased motor control in LUE >LLESkin: Skin is warm. She is not diaphoretic.  Psychiatric: Confused.   Assessment/Plan: 1. Functional deficits secondary to right MCA infarct which require 3+ hours per day of interdisciplinary therapy in a comprehensive inpatient rehab setting.  Physiatrist is providing close team supervision and 24 hour management of active medical problems listed below.  Physiatrist and rehab team continue to assess barriers to discharge/monitor patient progress toward functional and medical goals  Care Tool:  Bathing  Bathing activity did not occur: Refused(except agreed to wash periarea after toileting) Body parts bathed by patient: Chest, Abdomen, Front perineal area, Buttocks, Left arm, Right arm, Face, Left upper leg, Right upper leg, Right lower leg, Left lower leg   Body parts bathed by helper: Front  perineal area Body parts n/a: Buttocks   Bathing assist Assist Level: Moderate Assistance - Patient 50 - 74%     Upper Body Dressing/Undressing Upper body dressing Upper body dressing/undressing activity did not occur (including orthotics): Refused What is the patient wearing?: Bra, Pull over shirt    Upper body assist Assist Level: Moderate Assistance - Patient 50 - 74%    Lower Body Dressing/Undressing Lower body dressing    Lower body dressing activity did not occur: Refused What is the patient wearing?: Pants, Incontinence brief     Lower body assist Assist for lower body dressing: Moderate Assistance - Patient 50 - 74%     Toileting Toileting    Toileting assist Assist for toileting: Moderate Assistance - Patient 50 - 74% Assistive Device Comment: (Rolling walker)   Transfers Chair/bed transfer  Transfers assist     Chair/bed transfer assist level: Moderate Assistance - Patient 50 - 74%     Locomotion Ambulation   Ambulation assist      Assist level: Supervision/Verbal cueing Assistive device: Rollator Max distance: 150'   Walk 10 feet activity   Assist     Assist level: Supervision/Verbal cueing Assistive device: Rollator   Walk 50 feet activity   Assist    Assist level: Supervision/Verbal cueing Assistive device: Rollator    Walk 150 feet activity   Assist    Assist level: Supervision/Verbal cueing Assistive device: Rollator    Walk 10 feet on uneven surface  activity   Assist Walk 10 feet on uneven surfaces activity did not occur: Safety/medical concerns     Assistive device: PhotographerWalker-rolling   Wheelchair     Assist Will patient use wheelchair at discharge?: Yes(transport w/c)  Wheelchair activity did not occur: Safety/medical concerns         Wheelchair 50 feet with 2 turns activity    Assist    Wheelchair 50 feet with 2 turns activity did not occur: Safety/medical concerns       Wheelchair 150  feet activity     Assist Wheelchair 150 feet activity did not occur: Safety/medical concerns          Medical Problem List and Plan: 1.  Left side weakness secondary to right posterior MCA territory infarction as well as history of thrombotic stroke involving right MCA artery 2016.  Continue CIR PT, OT, SLP, ELOS 1/8 Per PT at supervision level for L neglect 2.  DVT Prophylaxis/Anticoagulation: subcutaneous Lovenox. Monitor for any bleeding episodes 3. Pain Management:  Tylenol as needed 4. Mood:  Xanax 0.25 mg daily at bedtime 5. Neuropsych: This patient is ?fully capable of making decisions on her own behalf. 6. Skin/Wound Care:  Routine skin checks 7. Fluids/Electrolytes/Nutrition:  Routine in and out's I yesterday             -encourage PO 8. Hypertension. Lopressor 50 mg twice a day   Vitals:   08/21/18 0432 08/21/18 0808  BP: 125/89   Pulse: (!) 59 88  Resp: 18   Temp: 98.7 F (37.1 C)   SpO2: 97%    9. Hyperlipidemia. Lipitor 10. Hypothyroidism. Synthroid 11. Constipation. Laxative assistance  Improving 12.  Hypokalemia  Potassium 3.2 on 1/1  Improved to 3.9 on 1/3  Continue to monitor 13.  Hypoalbuminemia  Supplement initiated on 1/1   LOS: 6 days A FACE TO FACE EVALUATION WAS PERFORMED  Erick Colace 08/21/2018, 8:59 AM

## 2018-08-21 NOTE — Consult Note (Signed)
WOC Nurse wound consult note Reason for Consult:Patient with paper thin skin and skin tear to left dorsal hand and left lower lateral leg.  Present on admission.  Patient is pleasant and unsure how these areas began. Daughter at bedside and is not sure either.  Moisture associated skin damage to coccyx.  Patient in pull up brief and daughter states she wears this at all times and is incontinent of urine many times daily.  Wound type:Trauma to hand and leg and moisture (with friction and shear) to coccyx Pressure Injury POA: Yes Measurement:coccyx:  0.3 cm nonintact center with 1 cm x 1 cm erythema.  Maceration to periwound Left lateral leg:  2 areas:  1 cm x 0.5 cm x 0.1 cm each consistent with abrasion Left hand at thumb:  0.3 cm skin tear Wound WIO:MBTD and moist Drainage (amount, consistency, odor) scant weeping Periwound:intact  Fragile thin skin Dressing procedure/placement/frequency:Cleanse left leg and hand with NS and pat dry.Apply mupirocin ointment daily andcover with nonadherent dry dressing.  Change daily.   Gerhardts butt paste to perineal skin twice daily and PRN soilage.  Will not follow at this time.  Please re-consult if needed.  Maple Hudson MSN, RN, FNP-BC CWON Wound, Ostomy, Continence Nurse Pager [phone]

## 2018-08-22 ENCOUNTER — Inpatient Hospital Stay (HOSPITAL_COMMUNITY): Payer: Self-pay | Admitting: Physical Therapy

## 2018-08-22 ENCOUNTER — Inpatient Hospital Stay (HOSPITAL_COMMUNITY): Payer: Medicare Other | Admitting: Occupational Therapy

## 2018-08-22 DIAGNOSIS — K5901 Slow transit constipation: Secondary | ICD-10-CM

## 2018-08-22 MED ORDER — GERHARDT'S BUTT CREAM: 1.0000 "application " | TOPICAL_CREAM | Freq: Two times a day (BID) | CUTANEOUS | 1 refills | Status: AC

## 2018-08-22 MED ORDER — ALPRAZOLAM 0.25 MG PO TABS: 0.2500 mg | ORAL_TABLET | Freq: Every morning | ORAL | 0 refills | Status: AC

## 2018-08-22 MED ORDER — METOPROLOL TARTRATE 50 MG PO TABS: 50.0000 mg | ORAL_TABLET | Freq: Two times a day (BID) | ORAL | 3 refills | Status: AC

## 2018-08-22 MED ORDER — ATORVASTATIN CALCIUM 40 MG PO TABS: 40.0000 mg | ORAL_TABLET | Freq: Every day | ORAL | 1 refills | Status: AC

## 2018-08-22 MED ORDER — ACETAMINOPHEN 325 MG PO TABS: 650.0000 mg | ORAL_TABLET | ORAL | Status: AC | PRN

## 2018-08-22 MED ORDER — MUPIROCIN 2 % EX OINT: TOPICAL_OINTMENT | Freq: Every day | CUTANEOUS | 0 refills | Status: AC

## 2018-08-22 MED ORDER — LEVOTHYROXINE SODIUM 25 MCG PO TABS: 25.0000 ug | ORAL_TABLET | Freq: Every day | ORAL | 0 refills | Status: AC

## 2018-08-22 NOTE — Discharge Summary (Signed)
NAME: ALEXUIS, PERSICO MEDICAL RECORD [id number] ACCOUNT [id number] DATE OF BIRTH:January 30, 1923 FACILITY: [facility] LOCATION: [facility] PHYSICIAN:ANDREW Wynn Banker, MD  DISCHARGE SUMMARY  DATE OF DISCHARGE:  08/23/2018  DISCHARGE DIAGNOSES: 1.  Right posterior.   2.   Subcutaneous Lovenox for deep venous thrombosis prophylaxis. 3.  Hypertension. 4.  Hyperlipidemia. 5.  Hypothyroidism. 6.  Constipation.   7.  Hypokalemia.  HISTORY OF PRESENT ILLNESS:  This is a 85 year old right-handed female with history of hypertension, CAD, right MCA infarction in the past.  Receiving inpatient rehabilitation services 2016 as well as mild dementia.  Lives with daughter one level home.   She has a personal care attendant.  Presented 08/12/2018 with increasing left-sided weakness.  CT of the head showed right MCA infarction.  MRI/MRA followup acute ischemic right posterior MCA territory infarction.  No associated hemorrhage.  Multiple  chronic microhemorrhages seen scattered throughout the cerebellum.  No large vessel occlusion.  Echocardiogram with ejection fraction of 60%, grade II diastolic dysfunction.  Carotid Dopplers with no ICA stenosis.  Maintained on aspirin for CVA  prophylaxis.  Subcutaneous Lovenox for DVT prophylaxis.  The patient was admitted for comprehensive rehabilitation program.  PAST MEDICAL HISTORY:  See discharge diagnoses.  SOCIAL HISTORY:  Lives with her daughter, has a personal care attendant.  FUNCTIONAL STATUS:  Upon admission to rehab services was minimal assist 50 feet rolling walker, minimal assist sit to stand, min mod assist for ADLs.  PHYSICAL EXAMINATION: VITAL SIGNS:  Blood pressure 129/70, pulse 67, temperature 98, respirations 18. GENERAL:  Alert female, pleasantly confused. HEENT:  EOMs intact. NECK:  Supple, nontender, no JVD. CARDIOVASCULAR:  Rate controlled. ABDOMEN:  Soft, nontender, good bowel sounds. LUNGS:  Clear to auscultation without wheeze.  REHABILITATION  HOSPITAL COURSE:  The patient was admitted to inpatient rehabilitation services.  Therapies initiated on a 3-hour daily basis, consisting of physical therapy, occupational therapy, speech therapy and rehabilitation nursing.  The following  issues were addressed:    Pertaining to the patient's right MCA infarction, remained on aspirin 325 mg daily.  She would follow up neurology services.    Subcutaneous Lovenox for DVT prophylaxis.  No bleeding episodes.    She was using scheduled Xanax for bedtime.    Blood pressure is controlled with Lopressor.    Bouts of hypokalemia, resolved with potassium supplement.    She remained on hormone supplement for hypothyroidism.    Wound care nurse followup for a skin tear to the left dorsal hand the left lower lateral leg with conservative wound care as directed.  Cleanse left leg normal saline, applied Mupirocin ointment, dry dressing.    The patient received weekly collaborative interdisciplinary team conferences.  She was ambulating to the toilet, close supervision rolling walker, needing cues to find the bathroom.  Minimal assist for lower extremity garment management.  Ambulates  around the unit primarily close supervision.  Occasional verbal cues.  Activities of daily living and homemaking,  again needing cueing.  Ambulates with rolling walker in the room.  Again needs max verbal cues to scan the environment.  It was stressed to  the family the need for supervision.  DISCHARGE MEDICATIONS:  Included Xanax 0.25 mg p.o. at bedtime, aspirin 325 mg p.o. daily, Lipitor 40 mg p.o. at bedtime, Synthroid 25 mcg p.o. daily, Lopressor 50 mg p.o. b.i.d., Tylenol as needed.  DIET:  Regular.    FOLLOWUP:  She would follow up with Dr. Claudette Laws at the outpatient rehab center as directed; Dr. [facility], call for appointment; Dr. Merlene Laughter, medical management.  AN/NUANCE D:08/22/2018 T:08/22/2018 JOB:[id number]

## 2018-08-22 NOTE — Progress Notes (Signed)
Occupational Therapy Discharge Summary  Patient Details  Name: Leah Figueroa MRN: [id number] Date of Birth: [birth year]    Patient has met 5 of 10 long term goals due to improved activity tolerance, improved balance, postural control, ability to compensate for deficits, functional use of  LEFT upper extremity, improved attention, improved awareness and improved coordination.  Patient to discharge at overall Mod Assist level.  Patient's care partner is independent to provide the necessary physical and cognitive assistance at discharge.  Pt's daughter is aware of the assist pt requires for bathing/dressing tasks. She has observed pt's CLOF and reports that pt only needing slightly more assist now compared to PLOF. Pt's daughter has demonstrated willing and ableness to cont to provide this level of assist at d/c.  Pt is unable to use L UE at functional level at this time. Demonstrates ability for full ROM and strength, however, has very poor control and awareness to positioning and use of L UE during functional tasks.   Reasons goals not met: Pt requires mod-max A overall for bathing/dressing tasks 2/2 severe ataxia and proprioceptual deficits and "alien syndrome" like movements in L UE.   Recommendation:  Patient will benefit from ongoing skilled OT services in home health setting to continue to advance functional skills in the area of BADL and Reduce care partner burden.  Equipment: Pt has all needed DME  Reasons for discharge: discharge from hospital  Patient/family agrees with progress made and goals achieved: Yes  OT Discharge Precautions/Restrictions  Precautions Precautions: Fall Precaution Comments: L inattention, very HOH (no hearing aides), baseline dementia Restrictions Weight Bearing Restrictions: No ADL ADL Eating: Set up Grooming: Minimal assistance Where Assessed-Grooming: Sitting at sink Upper Body Bathing: Minimal assistance Where Assessed-Upper Body Bathing: Sitting at  sink Lower Body Bathing: Moderate assistance Where Assessed-Lower Body Bathing: Sitting at sink Upper Body Dressing: Moderate assistance Where Assessed-Upper Body Dressing: Sitting at sink Lower Body Dressing: Moderate assistance Where Assessed-Lower Body Dressing: Sitting at sink Toileting: Moderate assistance Where Assessed-Toileting: Glass blower/designer: Psychiatric nurse Method: Counselling psychologist: Grab bars Vision Baseline Vision/History: Wears glasses Wears Glasses: At all times Vision Assessment?: Vision impaired- to be further tested in functional context Alignment/Gaze Preference: Gaze right Visual Fields: Left visual field deficit Perception  Perception: Impaired Inattention/Neglect: Does not attend to left visual field;Does not attend to left side of body Spatial Orientation: difficulty placing L hand on rollator, with poor awareness to L visual field and body orientation Praxis Praxis: Impaired Praxis Impairment Details: Motor planning Cognition Overall Cognitive Status: History of cognitive impairments - at baseline Arousal/Alertness: Awake/alert Orientation Level: Oriented to person;Oriented to place;Oriented to situation Attention: Sustained Sustained Attention: Impaired Sustained Attention Impairment: Verbal basic;Functional basic Memory: Impaired Memory Impairment: Decreased recall of new information;Decreased short term memory;Decreased long term memory Decreased Short Term Memory: Verbal basic;Functional basic Awareness: Impaired Awareness Impairment: Intellectual impairment Problem Solving: Impaired Problem Solving Impairment: Verbal basic;Functional basic Reasoning: Impaired Reasoning Impairment: Verbal basic;Functional basic Decision Making: Impaired Decision Making Impairment: Verbal basic;Functional basic Safety/Judgment: Impaired Comments: Decreased awareness of deficits and functional  implications Sensation Sensation Light Touch: Impaired Detail Light Touch Impaired Details: Impaired LUE Proprioception: Impaired Detail Proprioception Impaired Details: Absent LUE Coordination Gross Motor Movements are Fluid and Coordinated: No Fine Motor Movements are Fluid and Coordinated: No Coordination and Movement Description: "alien" arm symptoms of LUE, pt does attempt to move and use her LUE but due to impaired proprioception, kinesthesia, awareness her LUE is severely impaired Finger Nose  Finger Test: Decreased speed and accuracy B UEs Motor  Motor Motor: Abnormal postural alignment and control;Ataxia Motor - Discharge Observations: Generalized weakness. Severe ataxia L UE Trunk/Postural Assessment  Cervical Assessment Cervical Assessment: Exceptions to WFL(Forward head) Thoracic Assessment Thoracic Assessment: Exceptions to WFL(Kyphotic; Rounded shoulders) Lumbar Assessment Lumbar Assessment: Exceptions to WFL(Posterior pelvic tilt) Postural Control Postural Control: Deficits on evaluation Righting Reactions: Delayed Protective Responses: Delayed  Balance Balance Balance Assessed: Yes Dynamic Sitting Balance Dynamic Sitting - Balance Support: During functional activity;Feet supported Dynamic Sitting - Level of Assistance: 5: Stand by assistance Sitting balance - Comments: Sitting on toilet Dynamic Standing Balance Dynamic Standing - Balance Support: During functional activity;No upper extremity supported Dynamic Standing - Level of Assistance: 4: Min assist Dynamic Standing - Comments: Standing to complete LB clothing management Extremity/Trunk Assessment RUE Assessment RUE Assessment: Within Functional Limits General Strength Comments: Generalized weakness, however, able to use at functional level independently LUE Assessment LUE Assessment: Exceptions to [facility] General Strength Comments: Limited functional use 2/2 ataxia and "alien arm" like symptoms.  Demonstrates full ROM and WFL strength at all major muscle groups LUE Body System: Neuro   Lex Linhares L 08/22/2018, 1:44 PM

## 2018-08-22 NOTE — Progress Notes (Signed)
[place] PHYSICAL MEDICINE & REHABILITATION PROGRESS NOTE  Subjective/Complaints: No leg pain, pt does not recall trauma, appreciate WOC note  ROS: Denies CP, SOB, nausea, vomiting, diarrhea.  Objective: Vital Signs: Blood pressure (!) 136/58, pulse 96, temperature 98.4 F (36.9 C), temperature source Oral, resp. rate 18, SpO2 97 %. No results found. No results for input(s): WBC, HGB, HCT, PLT in the last 72 hours. No results for input(s): NA, K, CL, CO2, GLUCOSE, BUN, CREATININE, CALCIUM in the last 72 hours.  Physical Exam: BP (!) 136/58 (BP Location: Left Arm)   Pulse 96   Temp 98.4 F (36.9 C) (Oral)   Resp 18   SpO2 97%  Constitutional: No distress . Vital signs reviewed. HENT: Normocephalic.  Atraumatic. Eyes: EOMI. No discharge. Cardiovascular: RRR.  No JVD. Respiratory: CTA bilaterally.  Normal effort. GI: BS +. Non-distended. Musc: No edema or tenderness in extremities. Neurological:  Alert HOH Makes good eye contact with examiner. She follows simple commands.  Sensation intact to LT LUE Motor: Grossly 4/5 throughout, slightly weaker left upper extremity,decreased motor control in LUE >LLESkin: Skin is warm. Granulating area on Left lateral leg  Skin tear with sero sang drainage no odor Psychiatric: Confused.   Assessment/Plan: 1. Functional deficits secondary to right MCA infarct which require 3+ hours per day of interdisciplinary therapy in a comprehensive inpatient rehab setting.  Physiatrist is providing close team supervision and 24 hour management of active medical problems listed below.  Physiatrist and rehab team continue to assess barriers to discharge/monitor patient progress toward functional and medical goals  Care Tool:  Bathing  Bathing activity did not occur: Refused(except agreed to wash periarea after toileting) Body parts bathed by patient: Chest, Abdomen, Front perineal area, Buttocks, Left arm, Right arm, Face, Left upper leg, Right  upper leg, Right lower leg, Left lower leg   Body parts bathed by helper: Front perineal area Body parts n/a: Buttocks   Bathing assist Assist Level: Minimal Assistance - Patient > 75%     Upper Body Dressing/Undressing Upper body dressing Upper body dressing/undressing activity did not occur (including orthotics): Refused What is the patient wearing?: Bra, Pull over shirt    Upper body assist Assist Level: Moderate Assistance - Patient 50 - 74%    Lower Body Dressing/Undressing Lower body dressing    Lower body dressing activity did not occur: Refused What is the patient wearing?: Pants, Incontinence brief     Lower body assist Assist for lower body dressing: Moderate Assistance - Patient 50 - 74%     Toileting Toileting    Toileting assist Assist for toileting: Contact Guard/Touching assist Assistive Device Comment: (Rolling walker)   Transfers Chair/bed transfer  Transfers assist     Chair/bed transfer assist level: Contact Guard/Touching assist     Locomotion Ambulation   Ambulation assist      Assist level: Supervision/Verbal cueing Assistive device: Rollator Max distance: 150'   Walk 10 feet activity   Assist     Assist level: Supervision/Verbal cueing Assistive device: Rollator   Walk 50 feet activity   Assist    Assist level: Supervision/Verbal cueing Assistive device: Rollator    Walk 150 feet activity   Assist    Assist level: Supervision/Verbal cueing Assistive device: Rollator    Walk 10 feet on uneven surface  activity   Assist Walk 10 feet on uneven surfaces activity did not occur: Safety/medical concerns     Assistive device: Photographer Will  patient use wheelchair at discharge?: Yes(transport w/c)   Wheelchair activity did not occur: Safety/medical concerns         Wheelchair 50 feet with 2 turns activity    Assist    Wheelchair 50 feet with 2 turns activity did not  occur: Safety/medical concerns       Wheelchair 150 feet activity     Assist Wheelchair 150 feet activity did not occur: Safety/medical concerns          Medical Problem List and Plan: 1.  Left side weakness secondary to right posterior MCA territory infarction as well as history of thrombotic stroke involving right MCA artery 2016.  Continue CIR PT, OT, SLP, ELOS 1/8 Per PT at supervision level for L neglect 2.  DVT Prophylaxis/Anticoagulation: subcutaneous Lovenox. Monitor for any bleeding episodes 3. Pain Management:  Tylenol as needed 4. Mood:  Xanax 0.25 mg daily at bedtime 5. Neuropsych: This patient is ?fully capable of making decisions on her own behalf. 6. Skin/Wound Care: Left lateral leg abrasion some surround hypertrophic scarring, appears more chronic, no sign of cellulitis 7. Fluids/Electrolytes/Nutrition:  Routine in and out's I 280ml yesterday             -encourage PO 8. Hypertension. Lopressor 50 mg twice a day   Vitals:   08/22/18 0432 08/22/18 0858  BP: (!) 136/58   Pulse: 69 96  Resp: 18   Temp: 98.4 F (36.9 C)   SpO2: 97%    9. Hyperlipidemia. Lipitor 10. Hypothyroidism. Synthroid 11. Constipation. Laxative assistance  Improving 12.  Hypokalemia  Potassium 3.2 on 1/1  Improved to 3.9 on 1/3  Continue to monitor 13.  Hypoalbuminemia  Supplement initiated on 1/1   LOS: 7 days A FACE TO FACE EVALUATION WAS PERFORMED  Leah Figueroa Leah Figueroa 08/22/2018, 9:14 AM

## 2018-08-22 NOTE — Progress Notes (Signed)
Occupational Therapy Session Note  Patient Details  Name: Leah Figueroa MRN: [id number] Date of Birth: 1923/06/17  Today's Date: 08/22/2018 OT Individual Time: 3612-2449 OT Individual Time Calculation (min): 58 min    Short Term Goals: Week 1:  OT Short Term Goal 1 (Week 1): STGs = LTGs  Skilled Therapeutic Interventions/Progress Updates:    Pt seen for OT session focusig on ADL re-training. Pt asleep in supine upon arrival,easily awoken and agreeable to tx session. She voiced pain in L knee with mobility, repositioned and RN made aware.  She transferred to sitting EOB with supervision. Seated EOB, she donned socks and shoes, assist required to thread sock onto foot and awareness of foot positioning into shoe 2/2 apraxia and proprioceptive deficits. She ambulated into bathroom using rollator with close sueprivison. Required assist for managing brakes and andles of rolaltor 2/2 L UE apraxia. Compelted toileting task wit close supervision using primarely R UE to assit with clothing management and hygiene.  She returned to w/c to complete modifed bathing and dressing routine. Required max A overall for donning button up shirt as well as mod A for LB dressing, requiring assist to orient pants and thread on correctly as well as positioning of shoes when re-donning. Pt set-up with meal tray while seated in w/c, she was able to locate all items on meal tray with increased time. Assist for opening of containers.  Pt left seated in w/c at end of session, chair belt alarm donned and all needs in reach.   Therapy Documentation Precautions:  Precautions Precautions: Fall Precaution Comments: L inattention, very HOH (no hearing aides), baseline dementia Restrictions Weight Bearing Restrictions: No   Therapy/Group: Individual Therapy  Kace Hartje L 08/22/2018, 6:58 AM

## 2018-08-22 NOTE — Discharge Summary (Signed)
Discharge summary job [id number]

## 2018-08-22 NOTE — Progress Notes (Signed)
Physical Therapy Discharge Summary  Patient Details  Name: Leah Figueroa MRN: [id number] Date of Birth: 28-Jul-1923  Today's Date: 08/22/2018 PT Individual Time: 3762-8315 AND 1515-1535 PT Individual Time Calculation (min): 20 min AND 20 min  Session 1:  Pt in w/c and agreeable to therapy, denies pain. Daughter present for family education. Educated daughter on pt's CLOF including close [place] for all mobility. Per daughter, this is her baseline. Simulated and practiced home access as daughter and pt were doing PTA. This involved daughter providing HHA while she walked w/ pt to front door of house w/ 1 small step at threshold. Once in threshold, pt used rollator. Daughter returned demonstration safely. Pt ambulated around unit w/ close supervision using rollator. Close supervision required for obstacle avoidance and occasional assist to place LUE onto rollator. Educated on L inattention during ambulation and other functional tasks. Daughter concerned about bathing/dressing tasks. Daughter practiced assisting pt w/ changing clothes at seated level, providing min-mod assist overall safely. She states she had to provide some manual assistance w/ bathing/dressing tasks PTA, however not to this extent. She stated she feels comfortable w/ providing this level of care for pt as she does not want to place her in a nursing home. Educated daughter on realistic expectations for pt's stroke recovery with respect to function. Daughter verbalized understanding and in agreement w/ all recommendations and feels prepared to take her mother home w/ her. Returned to room and ended session in w/c and in care of daughter, all needs met.   Session 2:  Pt in supine and initially refusing to participate in therapy, stating "I walked too much this morning". Agreeable to go practice stairs and car transfer prior to returning to bed. Supervision bed mobility and transfer to w/c. Total assist w/c transport to/from therapy  gym. Performed car transfer w/ supervision and verbal/visual cues for technique. Negotiated 4 steps w/ CGA as well. Per OT request, practiced shower transfer as per home set-up w/ min assist via HHA. Returned to room and ended session in supine, all needs in reach.   Patient has met 5 of 6 long term goals due to improved activity tolerance, improved balance, improved postural control, increased strength, ability to compensate for deficits, improved attention, improved awareness and improved coordination.  Patient to discharge at an ambulatory level Supervision w/ rollator and min assist for curb/threshold negotiation. Patient's care partner is independent to provide the necessary physical and cognitive assistance at discharge.  Reasons goals not met: Pt continues to require hands-on assist for dynamic standing balance for safety.   Recommendation:  Patient will benefit from ongoing skilled PT services in home health setting to continue to advance safe functional mobility, address ongoing impairments in functional balance and postural control, global strength and endurance, L inattention and functional sequencing deficits, and minimize fall risk.  Equipment: transport chair  Reasons for discharge: treatment goals met and discharge from hospital  Patient/family agrees with progress made and goals achieved: Yes  PT Discharge Precautions/Restrictions Precautions Precautions: Fall Precaution Comments: L inattention, very HOH (no hearing aides), baseline dementia Restrictions Weight Bearing Restrictions: No Vital Signs Therapy Vitals Temp: 98.9 F (37.2 C) Temp Source: Oral Pulse Rate: 64 Resp: 17 BP: (!) 123/56 Patient Position (if appropriate): Lying Oxygen Therapy SpO2: 98 % O2 Device: Room Air Pain Pain Assessment Pain Scale: 0-10 Pain Score: 0-No pain Vision/Perception  Vision - Assessment Alignment/Gaze Preference: Gaze right Tracking/Visual Pursuits: Requires cues, head  turns, or add eye shifts to track Perception Perception:  Impaired Inattention/Neglect: Does not attend to left visual field;Does not attend to left side of body Spatial Orientation: difficulty placing L hand on rollator, with poor awareness to L visual field and body orientation Praxis Praxis: Impaired Praxis Impairment Details: Motor planning  Cognition Overall Cognitive Status: History of cognitive impairments - at baseline Arousal/Alertness: Awake/alert Orientation Level: Oriented to person;Oriented to place;Oriented to situation Attention: Sustained Sustained Attention: Impaired Sustained Attention Impairment: Verbal basic;Functional basic Memory: Impaired Memory Impairment: Decreased recall of new information;Decreased short term memory;Decreased long term memory Decreased Short Term Memory: Verbal basic;Functional basic Awareness: Impaired Awareness Impairment: Intellectual impairment Problem Solving: Impaired Problem Solving Impairment: Verbal basic;Functional basic Reasoning: Impaired Reasoning Impairment: Verbal basic;Functional basic Decision Making: Impaired Decision Making Impairment: Verbal basic;Functional basic Safety/Judgment: Impaired Comments: Decreased awareness of deficits and functional implications Sensation Sensation Light Touch: (unable to formally assess 2/2 cognition) Light Touch Impaired Details: Impaired LUE Proprioception: Impaired Detail Proprioception Impaired Details: Impaired LLE Coordination Gross Motor Movements are Fluid and Coordinated: No Fine Motor Movements are Fluid and Coordinated: No Coordination and Movement Description: "alien" arm symptoms of LUE, pt does attempt to move and use her LUE but due to impaired proprioception, kinesthesia, awareness her LUE is severely impaired Finger Nose Finger Test: Decreased speed and accuracy B UEs Motor  Motor Motor: Ataxia Motor - Discharge Observations: Generalized weakness, ataxic LUE  movements   Mobility Bed Mobility Bed Mobility: Rolling Left;Supine to Sit;Sit to Supine;Rolling Right Rolling Right: Independent Rolling Left: Independent Supine to Sit: Independent Sit to Supine: Independent Transfers Transfers: Sit to Stand;Stand to Sit;Stand Pivot Transfers Sit to Stand: Supervision/Verbal cueing Stand to Sit: Supervision/Verbal cueing Stand Pivot Transfers: Supervision/Verbal cueing Stand Pivot Transfer Details: Verbal cues for safe use of DME/AE;Verbal cues for precautions/safety;Verbal cues for technique;Verbal cues for sequencing;Manual facilitation for weight shifting;Manual facilitation for placement;Manual facilitation for weight bearing;Visual cues/gestures for sequencing;Visual cues/gestures for precautions/safety Transfer (Assistive device): None Locomotion  Gait Ambulation: Yes Gait Assistance: Supervision/Verbal cueing Gait Distance (Feet): 150 Feet Assistive device: Rollator Gait Assistance Details: Verbal cues for sequencing;Verbal cues for technique;Verbal cues for precautions/safety Gait Assistance Details: occasional manual assist needed to place LUE on rollator Gait Gait: Yes Gait Pattern: Impaired Gait Pattern: Ataxic;Narrow base of support Gait velocity: decreased Stairs / Additional Locomotion Stairs: Yes Stairs Assistance: Minimal Assistance - Patient > 75% Stair Management Technique: Two rails Number of Stairs: 4 Height of Stairs: 6 Wheelchair Mobility Wheelchair Mobility: No  Trunk/Postural Assessment  Cervical Assessment Cervical Assessment: Exceptions to WFL(forward head) Thoracic Assessment Thoracic Assessment: (rounded shoulders) Lumbar Assessment Lumbar Assessment: Exceptions to WFL(posterior pelvic tilt) Postural Control Postural Control: Deficits on evaluation Righting Reactions: Delayed Protective Responses: Delayed  Balance Balance Balance Assessed: Yes Dynamic Sitting Balance Dynamic Sitting - Balance Support:  During functional activity;Feet supported Dynamic Sitting - Level of Assistance: 5: Stand by assistance Sitting balance - Comments: Sitting on toilet Dynamic Standing Balance Dynamic Standing - Balance Support: During functional activity;No upper extremity supported Dynamic Standing - Level of Assistance: 4: Min assist Dynamic Standing - Comments: Standing to complete LB clothing management Extremity Assessment  RLE Assessment RLE Assessment: Within Functional Limits LLE Assessment LLE Assessment: Within Functional Limits    Frans Valente K Merrily Tegeler 08/22/2018, 3:59 PM

## 2018-08-22 NOTE — Progress Notes (Addendum)
Social Work  Discharge Note  The overall goal for the admission was met for:   Discharge location: [facility]  Length of Stay: Yes-8 DAYS  Discharge activity level: Yes-SUPERVISION-MIN ASSIST  Home/community participation: Yes  Services provided included: MD, RD, PT, OT, SLP, RN, CM, Pharmacy and SW  Financial Services: Medicare  Follow-up services arranged: Home Health: [facility], DME: [facility] and Patient/Family has no preference for HH/DME agencies ADDED [place]  Comments (or additional information):DAUGHTER IS HERE DAILY AND PARTICIPATING IN THERAPIES WITH PT. BETWEEN SHE AND HIRED CAREGIVER THEY CAN PROVIDE [facility] CARE.  Patient/Family verbalized understanding of follow-up arrangements: Yes  Individual responsible for coordination of the follow-up plan: [name]-DAUGHTER  Confirmed correct DME delivered: Elease Hashimoto 08/22/2018    Elease Hashimoto

## 2018-08-23 DIAGNOSIS — R414 Neurologic neglect syndrome: Secondary | ICD-10-CM

## 2018-08-23 NOTE — Progress Notes (Signed)
[place] PHYSICAL MEDICINE & REHABILITATION PROGRESS NOTE  Subjective/Complaints:  No issues overnite ROS: Denies CP, SOB, nausea, vomiting, diarrhea.  Objective: Vital Signs: Blood pressure 140/74, pulse 72, temperature 98.3 F (36.8 C), temperature source Oral, resp. rate 18, weight 40.1 kg, SpO2 95 %. No results found. No results for input(s): WBC, HGB, HCT, PLT in the last 72 hours. No results for input(s): NA, K, CL, CO2, GLUCOSE, BUN, CREATININE, CALCIUM in the last 72 hours.  Physical Exam: BP 140/74   Pulse 72   Temp 98.3 F (36.8 C) (Oral)   Resp 18   Wt 40.1 kg   SpO2 95%   BMI 22.13 kg/m  Constitutional: No distress . Vital signs reviewed. HENT: Normocephalic.  Atraumatic. Eyes: EOMI. No discharge. Cardiovascular: RRR.  No JVD. Respiratory: CTA bilaterally.  Normal effort. GI: BS +. Non-distended.  Musc: No edema or tenderness in extremities. Neurological:  Alert HOH Makes good eye contact with examiner. She follows simple commands.  Sensation intact to LT LUE Motor: Grossly 4/5 throughout, slightly weaker left upper extremity,decreased motor control in LUE >LLESkin: Skin is warm. Granulating area on Left lateral leg  Skin tear with sero sang drainage no odor Psychiatric: Confused.   Assessment/Plan: 1. Functional deficits secondary to right MCA infarct Stable for D/C today F/u PCP in 3-4 weeks F/u PM&R 2 weeks See D/C summary See D/C instructions Care Tool:  Bathing  Bathing activity did not occur: Refused(except agreed to wash periarea after toileting) Body parts bathed by patient: Chest, Abdomen, Front perineal area, Buttocks, Left arm, Right arm, Face, Left upper leg, Right upper leg, Right lower leg, Left lower leg   Body parts bathed by helper: Front perineal area Body parts n/a: Buttocks   Bathing assist Assist Level: Minimal Assistance - Patient > 75%     Upper Body Dressing/Undressing Upper body dressing Upper body dressing/undressing  activity did not occur (including orthotics): Refused What is the patient wearing?: Button up shirt    Upper body assist Assist Level: Maximal Assistance - Patient 25 - 49%    Lower Body Dressing/Undressing Lower body dressing    Lower body dressing activity did not occur: Refused What is the patient wearing?: Pants, Incontinence brief     Lower body assist Assist for lower body dressing: Moderate Assistance - Patient 50 - 74%     Toileting Toileting    Toileting assist Assist for toileting: Supervision/Verbal cueing Assistive Device Comment: (Rolling walker)   Transfers Chair/bed transfer  Transfers assist     Chair/bed transfer assist level: Supervision/Verbal cueing     Locomotion Ambulation   Ambulation assist      Assist level: Supervision/Verbal cueing Assistive device: Rollator Max distance: 150'   Walk 10 feet activity   Assist     Assist level: Supervision/Verbal cueing Assistive device: Rollator   Walk 50 feet activity   Assist    Assist level: Supervision/Verbal cueing Assistive device: Rollator    Walk 150 feet activity   Assist    Assist level: Supervision/Verbal cueing Assistive device: Rollator    Walk 10 feet on uneven surface  activity   Assist Walk 10 feet on uneven surfaces activity did not occur: Safety/medical concerns     Assistive device: PhotographerWalker-rolling   Wheelchair     Assist Will patient use wheelchair at discharge?: No   Wheelchair activity did not occur: N/A         Wheelchair 50 feet with 2 turns activity    Assist  Wheelchair 50 feet with 2 turns activity did not occur: N/A       Wheelchair 150 feet activity     Assist Wheelchair 150 feet activity did not occur: N/A          Medical Problem List and Plan: 1.  Left side weakness secondary to right posterior MCA territory infarction as well as history of thrombotic stroke involving right MCA artery 2016.  D/C home  today 2.  DVT Prophylaxis/Anticoagulation: subcutaneous Lovenox. Monitor for any bleeding episodes 3. Pain Management:  Tylenol as needed 4. Mood:  Xanax 0.25 mg daily at bedtime 5. Neuropsych: This patient is ?fully capable of making decisions on her own behalf. 6. Skin/Wound Care: Left lateral leg abrasion some surround hypertrophic scarring, appears more chronic, no sign of cellulitis 7. Fluids/Electrolytes/Nutrition:  Routine in and out's I yesterday             -encourage PO 8. Hypertension. Lopressor 50 mg twice a day   Vitals:   08/23/18 0515 08/23/18 0838  BP: 138/73 140/74  Pulse: 71 72  Resp: 18   Temp: 98.3 F (36.8 C)   SpO2: 95%    9. Hyperlipidemia. Lipitor 10. Hypothyroidism. Synthroid 11. Constipation. Laxative assistance  Improving 12.  Hypokalemia  Potassium 3.2 on 1/1  Improved to 3.9 on 1/3  Continue to monitor 13.  Hypoalbuminemia  Supplement initiated on 1/1   LOS: 8 days A FACE TO FACE EVALUATION WAS PERFORMED  Erick Colace 08/23/2018, 8:40 AM

## 2018-08-23 NOTE — Discharge Instructions (Signed)
Inpatient Rehab Discharge Instructions  Leah Figueroa Discharge date and time: No discharge date for patient encounter.   Activities/Precautions/ Functional Status: Activity: activity as tolerated Diet: regular diet Wound Care: none needed Functional status:  ___ No restrictions     ___ Walk up steps independently ___ 24/7 supervision/assistance   ___ Walk up steps with assistance ___ Intermittent supervision/assistance  ___ Bathe/dress independently ___ Walk with walker     _x STROKE/TIA DISCHARGE INSTRUCTIONS SMOKING Cigarette smoking nearly doubles your risk of having a stroke & is the single most alterable risk factor  If you smoke or have smoked in the last 12 months, you are advised to quit smoking for your health.  Most of the excess cardiovascular risk related to smoking disappears within a year of stopping.  Ask you doctor about anti-smoking medications  [place] Quit Line: 1-800-QUIT NOW  Free Smoking Cessation Classes [phone]  CHOLESTEROL Know your levels; limit fat & cholesterol in your diet  Lipid Panel     Component Value Date/Time   CHOL 115 08/14/2018 0526   TRIG 85 08/14/2018 0526   HDL 35 (L) 08/14/2018 0526   CHOLHDL 3.3 08/14/2018 0526   VLDL 17 08/14/2018 0526   LDLCALC 63 08/14/2018 0526      Many patients benefit from treatment even if their cholesterol is at goal.  Goal: Total Cholesterol (CHOL) less than 160  Goal:  Triglycerides (TRIG) less than 150  Goal:  HDL greater than 40  Goal:  LDL (LDLCALC) less than 100   BLOOD PRESSURE American Stroke Association blood pressure target is less that 120/80 mm/Hg  Your discharge blood pressure is:  BP: (!) 135/92  Monitor your blood pressure  Limit your salt and alcohol intake  Many individuals will require more than one medication for high blood pressure  DIABETES (A1c is a blood sugar average for last 3 months) Goal HGBA1c is under 7% (HBGA1c is blood sugar average for last 3 months)   Diabetes: No known diagnosis of diabetes    Lab Results  Component Value Date   HGBA1C 4.8 08/13/2018     Your HGBA1c can be lowered with medications, healthy diet, and exercise.  Check your blood sugar as directed by your physician  Call your physician if you experience unexplained or low blood sugars.  PHYSICAL ACTIVITY/REHABILITATION Goal is 30 minutes at least 4 days per week  Activity: Increase activity slowly, Therapies: Physical Therapy: Home Health Return to work:   Activity decreases your risk of heart attack and stroke and makes your heart stronger.  It helps control your weight and blood pressure; helps you relax and can improve your mood.  Participate in a regular exercise program.  Talk with your doctor about the best form of exercise for you (dancing, walking, swimming, cycling).  DIET/WEIGHT Goal is to maintain a healthy weight  Your discharge diet is:  Diet Order            Diet regular Room service appropriate? Yes; Fluid consistency: Thin  Diet effective now              liquids Your height is:    Your current weight is:   Your Body Mass Index (BMI) is:     Following the type of diet specifically designed for you will help prevent another stroke.  Your goal weight range is:    Your goal Body Mass Index (BMI) is 19-24.  Healthy food habits can help reduce 3 risk factors for stroke:  High cholesterol, hypertension, and excess weight.  RESOURCES Stroke/Support Group:  Call [phone]   STROKE EDUCATION PROVIDED/REVIEWED AND GIVEN TO PATIENT Stroke warning signs and symptoms How to activate emergency medical system (call 911). Medications prescribed at discharge. Need for follow-up after discharge. Personal risk factors for stroke. Pneumonia vaccine given:  Flu vaccine given:  My questions have been answered, the writing is legible, and I understand these instructions.  I will adhere to these goals & educational materials that have been provided to  me after my discharge from the hospital.   __ Bathe/dress with assistance ___ Walk Independently    ___ Shower independently ___ Walk with assistance    ___ Shower with assistance ___ No alcohol     ___ Return to work/school ________  Special Instructions:    COMMUNITY REFERRALS UPON DISCHARGE:    Home Health:   PT, OT & AIDE  Agency:[facility] [phone]   Date of last service:08/23/2018  Medical Equipment/Items Ordered:TRANSPORT CHAIR  Agency/Supplier:[facility]   [id number]  Other:DAUGHTER WILL RESUME HIRED ASSIST FOR NIGHTS  GENERAL COMMUNITY RESOURCES FOR PATIENT/FAMILY: Support Groups:[facility] SUPPORT GROUP THE SECOND Thursday OF THE MONTH @ 6:00-7:00 PM ON THE REHAB UNIT QUESTIONS CONTACT AMY [phone]   My questions have been answered and I understand these instructions. I will adhere to these goals and the provided educational materials after my discharge from the hospital.  Patient/Caregiver Signature _______________________________ Date __________  Clinician Signature _______________________________________ Date __________  Please bring this form and your medication list with you to all your follow-up doctor's appointments.

## 2018-08-24 ENCOUNTER — Telehealth: Payer: Self-pay | Admitting: *Deleted

## 2018-08-24 NOTE — Telephone Encounter (Signed)
[facility] called to verify that Dr Wynn Banker would be signing HH orders. I have let them know he will be responsible.

## 2018-08-25 ENCOUNTER — Telehealth: Payer: Self-pay | Admitting: Registered Nurse

## 2018-08-25 NOTE — Telephone Encounter (Signed)
Placed a call to [name] daughter regarding Ms. Luczak, no answer. Left message to return the call.

## 2018-08-25 NOTE — Telephone Encounter (Signed)
Transitional Care call Transitional Care Call Completed Transitional Care Call Questions answered by Daughter [name]  Patient name: Leah Figueroa DOB: 05/17/1923 1. Are you/is patient experiencing any problems since coming home? No a. Are there any questions regarding any aspect of care? No 2. Are there any questions regarding medications administration/dosing? No a. Are meds being taken as prescribed? Yes b. "Patient should review meds with caller to confirm" Medication List Reviewed 3. Have there been any falls? No 4. Has Home Health been to the house and/or have they contacted you? Yes, [facility]. She is scheduled for cisit on 08/26/2018 a. If not, have you tried to contact them? NA b. Can we help you contact them? NA 5. Are bowels and bladder emptying properly? Yes with voiding, she hasn't had a bowel movement she reports. Encouraged to continue to monitor and call office with any questions or concerns. She verbalizes understanding. a. Are there any unexpected incontinence issues? No b. If applicable, is patient following bowel/bladder programs? NA 6. Any fevers, problems with breathing, unexpected pain? No 7. Are there any skin problems or new areas of breakdown? No, she already had a skin tear on her left hand and her daughter is following discharge instructions, she reports.  Has the patient/family member arranged specialty MD follow up (ie cardiology/neurology/renal/surgical/etc.)?  Yes, she is awaiting a return call from [facility].  a. Can we help arrange? NA 8. Does the patient need any other services or support that we can help arrange? NA 9. Are caregivers following through as expected in assisting the patient? Yes 10. Has the patient quit smoking, drinking alcohol, or using drugs as recommended? Ms. [name] reports [name] doesn't smoke, drink alcohol or use illicit drugs.   Appointment date/time 09/04/2018  arrival time 11:00 for 11:20 appointment with Jones Bales ANP-C. . At [address]

## 2018-08-26 DIAGNOSIS — I69318 Other symptoms and signs involving cognitive functions following cerebral infarction: Secondary | ICD-10-CM | POA: Diagnosis not present

## 2018-08-26 DIAGNOSIS — I10 Essential (primary) hypertension: Secondary | ICD-10-CM | POA: Diagnosis not present

## 2018-08-26 DIAGNOSIS — I2510 Atherosclerotic heart disease of native coronary artery without angina pectoris: Secondary | ICD-10-CM | POA: Diagnosis not present

## 2018-08-26 DIAGNOSIS — S81802D Unspecified open wound, left lower leg, subsequent encounter: Secondary | ICD-10-CM | POA: Diagnosis not present

## 2018-08-26 DIAGNOSIS — I252 Old myocardial infarction: Secondary | ICD-10-CM | POA: Diagnosis not present

## 2018-08-26 DIAGNOSIS — L89151 Pressure ulcer of sacral region, stage 1: Secondary | ICD-10-CM | POA: Diagnosis not present

## 2018-08-26 DIAGNOSIS — I480 Paroxysmal atrial fibrillation: Secondary | ICD-10-CM | POA: Diagnosis not present

## 2018-08-26 DIAGNOSIS — M1990 Unspecified osteoarthritis, unspecified site: Secondary | ICD-10-CM | POA: Diagnosis not present

## 2018-08-26 DIAGNOSIS — I69354 Hemiplegia and hemiparesis following cerebral infarction affecting left non-dominant side: Secondary | ICD-10-CM | POA: Diagnosis not present

## 2018-08-28 ENCOUNTER — Telehealth: Payer: Self-pay

## 2018-08-28 DIAGNOSIS — L89151 Pressure ulcer of sacral region, stage 1: Secondary | ICD-10-CM | POA: Diagnosis not present

## 2018-08-28 DIAGNOSIS — I69318 Other symptoms and signs involving cognitive functions following cerebral infarction: Secondary | ICD-10-CM | POA: Diagnosis not present

## 2018-08-28 DIAGNOSIS — I10 Essential (primary) hypertension: Secondary | ICD-10-CM | POA: Diagnosis not present

## 2018-08-28 DIAGNOSIS — S81802D Unspecified open wound, left lower leg, subsequent encounter: Secondary | ICD-10-CM | POA: Diagnosis not present

## 2018-08-28 DIAGNOSIS — I2510 Atherosclerotic heart disease of native coronary artery without angina pectoris: Secondary | ICD-10-CM | POA: Diagnosis not present

## 2018-08-28 DIAGNOSIS — I252 Old myocardial infarction: Secondary | ICD-10-CM | POA: Diagnosis not present

## 2018-08-28 DIAGNOSIS — M1990 Unspecified osteoarthritis, unspecified site: Secondary | ICD-10-CM | POA: Diagnosis not present

## 2018-08-28 DIAGNOSIS — I69354 Hemiplegia and hemiparesis following cerebral infarction affecting left non-dominant side: Secondary | ICD-10-CM | POA: Diagnosis not present

## 2018-08-28 DIAGNOSIS — I480 Paroxysmal atrial fibrillation: Secondary | ICD-10-CM | POA: Diagnosis not present

## 2018-08-28 NOTE — Telephone Encounter (Signed)
Joey, PT from [facility] called requesting verbal orders for 1wk1, 2wk3. Orders approved and given per discharge summary.

## 2018-08-29 ENCOUNTER — Telehealth: Payer: Self-pay | Admitting: *Deleted

## 2018-08-29 NOTE — Telephone Encounter (Signed)
Melissa notifed of SN approval.

## 2018-08-29 NOTE — Telephone Encounter (Signed)
May order home health nurse

## 2018-08-29 NOTE — Telephone Encounter (Signed)
Reroute to Dr. Wynn Banker

## 2018-08-29 NOTE — Telephone Encounter (Signed)
Ron ParkerJim Bell, OT, [facility] left a message asking for verbal orders for a home health nurse visit to evaluate a wound that the patient has.    Please advise. Occupational therapist cannot take these orders.  Please Contact Delice LeschMelissa Hairston at [facility] [phone] to give verbal orders   [facility] which [facility] over the phone per office protocol

## 2018-08-30 DIAGNOSIS — I480 Paroxysmal atrial fibrillation: Secondary | ICD-10-CM | POA: Diagnosis not present

## 2018-08-30 DIAGNOSIS — I10 Essential (primary) hypertension: Secondary | ICD-10-CM | POA: Diagnosis not present

## 2018-08-30 DIAGNOSIS — M1990 Unspecified osteoarthritis, unspecified site: Secondary | ICD-10-CM | POA: Diagnosis not present

## 2018-08-30 DIAGNOSIS — F0150 Vascular dementia without behavioral disturbance: Secondary | ICD-10-CM | POA: Diagnosis not present

## 2018-08-30 DIAGNOSIS — L89151 Pressure ulcer of sacral region, stage 1: Secondary | ICD-10-CM | POA: Diagnosis not present

## 2018-08-30 DIAGNOSIS — S81812A Laceration without foreign body, left lower leg, initial encounter: Secondary | ICD-10-CM | POA: Diagnosis not present

## 2018-08-30 DIAGNOSIS — I69318 Other symptoms and signs involving cognitive functions following cerebral infarction: Secondary | ICD-10-CM | POA: Diagnosis not present

## 2018-08-30 DIAGNOSIS — S81802D Unspecified open wound, left lower leg, subsequent encounter: Secondary | ICD-10-CM | POA: Diagnosis not present

## 2018-08-30 DIAGNOSIS — I2510 Atherosclerotic heart disease of native coronary artery without angina pectoris: Secondary | ICD-10-CM | POA: Diagnosis not present

## 2018-08-30 DIAGNOSIS — I252 Old myocardial infarction: Secondary | ICD-10-CM | POA: Diagnosis not present

## 2018-08-30 DIAGNOSIS — I69354 Hemiplegia and hemiparesis following cerebral infarction affecting left non-dominant side: Secondary | ICD-10-CM | POA: Diagnosis not present

## 2018-08-30 DIAGNOSIS — R269 Unspecified abnormalities of gait and mobility: Secondary | ICD-10-CM | POA: Diagnosis not present

## 2018-08-30 DIAGNOSIS — Z7189 Other specified counseling: Secondary | ICD-10-CM | POA: Diagnosis not present

## 2018-09-01 DIAGNOSIS — L89151 Pressure ulcer of sacral region, stage 1: Secondary | ICD-10-CM | POA: Diagnosis not present

## 2018-09-01 DIAGNOSIS — I480 Paroxysmal atrial fibrillation: Secondary | ICD-10-CM | POA: Diagnosis not present

## 2018-09-01 DIAGNOSIS — I2510 Atherosclerotic heart disease of native coronary artery without angina pectoris: Secondary | ICD-10-CM | POA: Diagnosis not present

## 2018-09-01 DIAGNOSIS — M1990 Unspecified osteoarthritis, unspecified site: Secondary | ICD-10-CM | POA: Diagnosis not present

## 2018-09-01 DIAGNOSIS — I252 Old myocardial infarction: Secondary | ICD-10-CM | POA: Diagnosis not present

## 2018-09-01 DIAGNOSIS — I10 Essential (primary) hypertension: Secondary | ICD-10-CM | POA: Diagnosis not present

## 2018-09-01 DIAGNOSIS — S81802D Unspecified open wound, left lower leg, subsequent encounter: Secondary | ICD-10-CM | POA: Diagnosis not present

## 2018-09-01 DIAGNOSIS — I69318 Other symptoms and signs involving cognitive functions following cerebral infarction: Secondary | ICD-10-CM | POA: Diagnosis not present

## 2018-09-01 DIAGNOSIS — I69354 Hemiplegia and hemiparesis following cerebral infarction affecting left non-dominant side: Secondary | ICD-10-CM | POA: Diagnosis not present

## 2018-09-02 DIAGNOSIS — I252 Old myocardial infarction: Secondary | ICD-10-CM | POA: Diagnosis not present

## 2018-09-02 DIAGNOSIS — I69354 Hemiplegia and hemiparesis following cerebral infarction affecting left non-dominant side: Secondary | ICD-10-CM | POA: Diagnosis not present

## 2018-09-02 DIAGNOSIS — I10 Essential (primary) hypertension: Secondary | ICD-10-CM | POA: Diagnosis not present

## 2018-09-02 DIAGNOSIS — L89151 Pressure ulcer of sacral region, stage 1: Secondary | ICD-10-CM | POA: Diagnosis not present

## 2018-09-02 DIAGNOSIS — I69318 Other symptoms and signs involving cognitive functions following cerebral infarction: Secondary | ICD-10-CM | POA: Diagnosis not present

## 2018-09-02 DIAGNOSIS — S81802D Unspecified open wound, left lower leg, subsequent encounter: Secondary | ICD-10-CM | POA: Diagnosis not present

## 2018-09-02 DIAGNOSIS — I2510 Atherosclerotic heart disease of native coronary artery without angina pectoris: Secondary | ICD-10-CM | POA: Diagnosis not present

## 2018-09-02 DIAGNOSIS — M1990 Unspecified osteoarthritis, unspecified site: Secondary | ICD-10-CM | POA: Diagnosis not present

## 2018-09-02 DIAGNOSIS — I480 Paroxysmal atrial fibrillation: Secondary | ICD-10-CM | POA: Diagnosis not present

## 2018-09-04 ENCOUNTER — Other Ambulatory Visit: Payer: Self-pay

## 2018-09-04 ENCOUNTER — Encounter: Payer: Medicare HMO | Attending: Registered Nurse | Admitting: Registered Nurse

## 2018-09-04 VITALS — BP 117/70 | HR 63 | Ht <= 58 in | Wt 84.0 lb

## 2018-09-04 DIAGNOSIS — L89151 Pressure ulcer of sacral region, stage 1: Secondary | ICD-10-CM | POA: Diagnosis not present

## 2018-09-04 DIAGNOSIS — I69318 Other symptoms and signs involving cognitive functions following cerebral infarction: Secondary | ICD-10-CM | POA: Diagnosis not present

## 2018-09-04 DIAGNOSIS — R414 Neurologic neglect syndrome: Secondary | ICD-10-CM

## 2018-09-04 DIAGNOSIS — I10 Essential (primary) hypertension: Secondary | ICD-10-CM

## 2018-09-04 DIAGNOSIS — I69354 Hemiplegia and hemiparesis following cerebral infarction affecting left non-dominant side: Secondary | ICD-10-CM

## 2018-09-04 DIAGNOSIS — M1990 Unspecified osteoarthritis, unspecified site: Secondary | ICD-10-CM | POA: Diagnosis not present

## 2018-09-04 DIAGNOSIS — I69319 Unspecified symptoms and signs involving cognitive functions following cerebral infarction: Secondary | ICD-10-CM | POA: Diagnosis not present

## 2018-09-04 DIAGNOSIS — I480 Paroxysmal atrial fibrillation: Secondary | ICD-10-CM | POA: Diagnosis not present

## 2018-09-04 DIAGNOSIS — E038 Other specified hypothyroidism: Secondary | ICD-10-CM

## 2018-09-04 DIAGNOSIS — Z8673 Personal history of transient ischemic attack (TIA), and cerebral infarction without residual deficits: Secondary | ICD-10-CM | POA: Diagnosis not present

## 2018-09-04 DIAGNOSIS — E785 Hyperlipidemia, unspecified: Secondary | ICD-10-CM | POA: Diagnosis not present

## 2018-09-04 DIAGNOSIS — I2510 Atherosclerotic heart disease of native coronary artery without angina pectoris: Secondary | ICD-10-CM | POA: Diagnosis not present

## 2018-09-04 DIAGNOSIS — S81802D Unspecified open wound, left lower leg, subsequent encounter: Secondary | ICD-10-CM | POA: Diagnosis not present

## 2018-09-04 DIAGNOSIS — I252 Old myocardial infarction: Secondary | ICD-10-CM | POA: Diagnosis not present

## 2018-09-04 NOTE — Progress Notes (Signed)
Subjective:    Patient ID: Leah Figueroa, female    DOB: 1922/11/18, 85 y.o.   MRN: [id number]  HPI: Leah Figueroa is a 85 y.o. female who is here for Transitional Care visit. She [facility]  [facility]2018 with left sided weakness, confusion and dizziness. [facility] for left arm weakness. CT was ordered. On 08/07/2018. Her daughter elected to return home.   IMPRESSION: 1. Chronic moderate small vessel ischemic disease of periventricular and subcortical white matter. 2. Chronic right posterior parietal and right cerebellar infarcts. 3. No acute intracranial abnormality.  [facility]2018, [facility] with complaints of increasing left sided weakness and worsening dizziness and confusion. Also had poor oral intake over te last two days.  MRA Head without Contrast:   1. Acute ischemic right posterior MCA territory infarct as above. No associated hemorrhage or mass effect. 2. Additional chronic right posterior MCA territory infarct, with multiple chronic bilateral cerebellar infarcts, right greater than left. 3. Multiple chronic micro hemorrhages seen scattered throughout the cerebellum, most consistent with chronic poorly controlled hypertension. 4. Underlying age-related cerebral atrophy with advanced chronic microvascular ischemic disease.  MRA HEAD IMPRESSION: 1. Negative intracranial MRA for large vessel occlusion. 2. Attenuation of the right MCA branches as compared to the left, likely related to the patient's chronic right MCA territory infarction. 3. Moderate small vessel atheromatous irregularity throughout the intracranial circulation. No proximal high-grade or correctable stenosis identified.  Pertaining Right MCA Infarction, remained on Asprin, [facility] 08[facility]2018 and discharge on 08/23/2018.  Discharge home with [facility]. She denies pain, she rates her  pain 0. Daughter reports poor appetite, she's drinking nutritional supplement.   Pain Inventory Average Pain 0 Pain Right Now 0 My pain is no pain  In the last 24 hours, has pain interfered with the following? General activity 0 Relation with others 0 Enjoyment of life 0 What TIME of day is your pain at its worst? no pain Sleep (in general) Fair  Pain is worse with: no pain Pain improves with: no pain Relief from Meds: no meds  Mobility use a walker do you drive?  no use a wheelchair needs help with transfers  Function I need assistance with the following:  feeding, dressing, bathing, toileting, meal prep, household duties and shopping  Neuro/Psych confusion depression loss of taste or smell suicidal thoughts  Prior Studies Any changes since last visit?  yes x-rays CT/MRI  Physicians involved in your care Primary care Dr. Merlene LaughterHal Stoneking   Family History  Problem Relation Age of Onset  . Colon cancer Mother   . Breast cancer Mother   . Coronary artery disease Brother   . Dementia Sister    Social History   Socioeconomic History  . Marital status: Widowed    Spouse name: Not on file  . Number of children: 1  . Years of education: 1711  . Highest education level: Not on file  Occupational History  . Occupation: retired  Engineer, productionocial Needs  . Financial resource strain: Not on file  . Food insecurity:    Worry: Not on file    Inability: Not on file  . Transportation needs:    Medical: No    Non-medical: Not on file  Tobacco Use  . Smoking status: Never Smoker  . Smokeless tobacco: Never Used  Substance and Sexual Activity  . Alcohol use: No  . Drug  use: No  . Sexual activity: Never  Lifestyle  . Physical activity:    Days per week: Not on file    Minutes per session: Not on file  . Stress: Not on file  Relationships  . Social connections:    Talks on phone: Not on file    Gets together: Not on file    Attends religious service: Not on file     Active member of club or organization: Not on file    Attends meetings of clubs or organizations: Not on file    Relationship status: Not on file  Other Topics Concern  . Not on file  Social History Narrative   HSG. Married '48- 57 yrs/widowed. 1 daughter - '59; 1 grandchild. 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  Lives alone. I-ADLs, manages books, pays bills.. End-of-Life Care: no heroic measures; No CPR, no mechanical ventilation.  Caffeine use/day:  None. Does Patient Exercise:  No       03/07/17 lives with daughter, Leah Figueroa   Past Surgical History:  Procedure Laterality Date  . BACK SURGERY    . CATARACT EXTRACTION     left-2006, right 2011  . CHOLECYSTECTOMY    . intrathoracic goiter resection    . lumpectomy for breast cancer Left 80's   radiation therapy  . right thyroid lobectomy     Hx Goiter   Past Medical History:  Diagnosis Date  . Acute myocardial infarction of other anterior wall, episode of care unspecified   . Arthritis   . Coronary artery disease   . Other and unspecified hyperlipidemia   . Personal history of arthritis   . Personal history of malignant neoplasm of breast    s/p lumpectomy  . Stroke [facility]) 03/2015   "left sided issues"  . Unspecified essential hypertension   . Unspecified hypothyroidism    BP 117/70   Pulse 63   Ht 4\' 8"  (1.422 m)   Wt 84 lb (38.1 kg)   SpO2 95%   BMI 18.83 kg/m   Opioid Risk Score:   Fall Risk Score:  `1  Depression screen PHQ 2/9  Depression screen [facility] 2/9 09/04/2018 07/20/2016 01/20/2016 10/23/2015 04/29/2015  Decreased Interest 1 0 0 0 2  Down, Depressed, Hopeless 1 0 0 0 3  PHQ - 2 Score 2 0 0 0 5  Altered sleeping - - - - 2  Tired, decreased energy - - - - 0  Change in appetite - - - - 3  Feeling bad or failure about yourself  - - - - 2  Trouble concentrating - - - - 0  Moving slowly or fidgety/restless - - - - 0  Suicidal thoughts - - - - 0  PHQ-9 Score - - - - 12  Difficult doing work/chores - - - - Not  difficult at all    Review of Systems  Constitutional: Positive for appetite change and unexpected weight change.  Eyes: Negative.   Respiratory: Positive for cough.   Cardiovascular: Negative.   Gastrointestinal: Negative.   Endocrine: Negative.   Genitourinary: Negative.   Musculoskeletal: Negative.   Skin: Positive for rash.  Allergic/Immunologic: Negative.   Neurological: Negative.   Hematological: Negative.   Psychiatric/Behavioral: Negative.   All other systems reviewed and are negative.      Objective:   Physical Exam Vitals signs and nursing note reviewed.  Constitutional:      Appearance: Normal appearance.  Neck:     Musculoskeletal: Normal range of motion and neck supple.  Cardiovascular:     Rate and Rhythm: Normal rate and regular rhythm.     Pulses: Normal pulses.     Heart sounds: Normal heart sounds.  Pulmonary:     Effort: Pulmonary effort is normal.     Breath sounds: Normal breath sounds.  Musculoskeletal:     Comments: Normal Muscle Bulk and Muscle Testing Reveals:  Upper Extremities:Full  ROM and Muscle Strength 4/5  Lower Extremities: Full ROM and Muscle Strength 4/5  Arrived in Wheelchair    Skin:    General: Skin is warm and dry.  Neurological:     Mental Status: She is alert and oriented to person, place, and time.  Psychiatric:        Mood and Affect: Mood normal.        Behavior: Behavior normal.           Assessment & Plan:  1. History of Right MCA Stroke:Hemiparesis affecting left side as late effect of Stroke: Continue Home Health Therapy. 2. Cognitive Deficit/ Left- Side Neglect- Post-Stroke: Continue Home Health Therapy. 3. Hypertension: Continue current medication regimen.  4. Dyslipidemia: Continue current medication regimen. 5. Hypothyroidism: Continue Synthroid. PCP following.   20 minutes of face to face patient care time was spent during this visit. All questions were encouraged and answered.  F/U with Dr. Wynn Banker  in 4-6 weeks

## 2018-09-04 NOTE — Patient Outreach (Incomplete)
[facility]) Care Management  09/04/2018  Leah Figueroa 04-18-1923 [id number]   EMMI: Stroke red alert  Referral date: 09/04/18 Referral reason: Questions/ problems with medications Insurance: [facility] Day # 9  Telephone call to patient.    PLAN:

## 2018-09-05 ENCOUNTER — Encounter: Payer: Self-pay | Admitting: Registered Nurse

## 2018-09-05 ENCOUNTER — Other Ambulatory Visit: Payer: Self-pay

## 2018-09-05 DIAGNOSIS — I2510 Atherosclerotic heart disease of native coronary artery without angina pectoris: Secondary | ICD-10-CM | POA: Diagnosis not present

## 2018-09-05 DIAGNOSIS — I480 Paroxysmal atrial fibrillation: Secondary | ICD-10-CM | POA: Diagnosis not present

## 2018-09-05 DIAGNOSIS — H539 Unspecified visual disturbance: Secondary | ICD-10-CM

## 2018-09-05 DIAGNOSIS — Z7982 Long term (current) use of aspirin: Secondary | ICD-10-CM

## 2018-09-05 DIAGNOSIS — I69318 Other symptoms and signs involving cognitive functions following cerebral infarction: Secondary | ICD-10-CM | POA: Diagnosis not present

## 2018-09-05 DIAGNOSIS — Z853 Personal history of malignant neoplasm of breast: Secondary | ICD-10-CM

## 2018-09-05 DIAGNOSIS — E039 Hypothyroidism, unspecified: Secondary | ICD-10-CM

## 2018-09-05 DIAGNOSIS — E8809 Other disorders of plasma-protein metabolism, not elsewhere classified: Secondary | ICD-10-CM

## 2018-09-05 DIAGNOSIS — L89151 Pressure ulcer of sacral region, stage 1: Secondary | ICD-10-CM | POA: Diagnosis not present

## 2018-09-05 DIAGNOSIS — H9193 Unspecified hearing loss, bilateral: Secondary | ICD-10-CM

## 2018-09-05 DIAGNOSIS — K5900 Constipation, unspecified: Secondary | ICD-10-CM

## 2018-09-05 DIAGNOSIS — M1990 Unspecified osteoarthritis, unspecified site: Secondary | ICD-10-CM

## 2018-09-05 DIAGNOSIS — E46 Unspecified protein-calorie malnutrition: Secondary | ICD-10-CM

## 2018-09-05 DIAGNOSIS — Z87440 Personal history of urinary (tract) infections: Secondary | ICD-10-CM

## 2018-09-05 DIAGNOSIS — S81802D Unspecified open wound, left lower leg, subsequent encounter: Secondary | ICD-10-CM | POA: Diagnosis not present

## 2018-09-05 DIAGNOSIS — F0280 Dementia in other diseases classified elsewhere without behavioral disturbance: Secondary | ICD-10-CM

## 2018-09-05 DIAGNOSIS — Z9181 History of falling: Secondary | ICD-10-CM

## 2018-09-05 DIAGNOSIS — R482 Apraxia: Secondary | ICD-10-CM

## 2018-09-05 DIAGNOSIS — I10 Essential (primary) hypertension: Secondary | ICD-10-CM | POA: Diagnosis not present

## 2018-09-05 DIAGNOSIS — I252 Old myocardial infarction: Secondary | ICD-10-CM

## 2018-09-05 DIAGNOSIS — I69354 Hemiplegia and hemiparesis following cerebral infarction affecting left non-dominant side: Secondary | ICD-10-CM | POA: Diagnosis not present

## 2018-09-05 NOTE — Patient Outreach (Addendum)
[facility]) Care Management  09/05/2018  Leah Figueroa 1922-09-26 [id number]  EMMI: stroke red alert Referral date: 09/04/18 Referral reason:Questions / problems with medications Insurance: [facility] Day # 9 Attempt #1  Telephone call to patient regarding EMMI stroke red alert  referral. Unable to reach patient.  Contact answering phone states patient is asleep.  HIPAA compliant message left with call back phone number.   PLAN: RNCM will attempt 2nd telephone call to patient within 4 business days. RNCM will send outreach letter.   [name] RN,BSN, CCM [facility] Telephonic  [phone]

## 2018-09-05 NOTE — Telephone Encounter (Unsigned)
This encounter was created in error - please disregard.

## 2018-09-06 DIAGNOSIS — I10 Essential (primary) hypertension: Secondary | ICD-10-CM | POA: Diagnosis not present

## 2018-09-06 DIAGNOSIS — I480 Paroxysmal atrial fibrillation: Secondary | ICD-10-CM | POA: Diagnosis not present

## 2018-09-06 DIAGNOSIS — I2510 Atherosclerotic heart disease of native coronary artery without angina pectoris: Secondary | ICD-10-CM | POA: Diagnosis not present

## 2018-09-06 DIAGNOSIS — L89151 Pressure ulcer of sacral region, stage 1: Secondary | ICD-10-CM | POA: Diagnosis not present

## 2018-09-06 DIAGNOSIS — I69318 Other symptoms and signs involving cognitive functions following cerebral infarction: Secondary | ICD-10-CM | POA: Diagnosis not present

## 2018-09-06 DIAGNOSIS — I252 Old myocardial infarction: Secondary | ICD-10-CM | POA: Diagnosis not present

## 2018-09-06 DIAGNOSIS — M1990 Unspecified osteoarthritis, unspecified site: Secondary | ICD-10-CM | POA: Diagnosis not present

## 2018-09-06 DIAGNOSIS — I69354 Hemiplegia and hemiparesis following cerebral infarction affecting left non-dominant side: Secondary | ICD-10-CM | POA: Diagnosis not present

## 2018-09-06 DIAGNOSIS — S81802D Unspecified open wound, left lower leg, subsequent encounter: Secondary | ICD-10-CM | POA: Diagnosis not present

## 2018-09-07 ENCOUNTER — Other Ambulatory Visit: Payer: Self-pay

## 2018-09-07 ENCOUNTER — Emergency Department (HOSPITAL_COMMUNITY): Payer: Medicare HMO

## 2018-09-07 ENCOUNTER — Encounter (HOSPITAL_COMMUNITY): Payer: Self-pay

## 2018-09-07 ENCOUNTER — Emergency Department (HOSPITAL_COMMUNITY)
Admission: EM | Admit: 2018-09-07 | Discharge: 2018-09-07 | Disposition: A | Discharge status: Home / Self Care | Payer: Medicare HMO | Attending: Emergency Medicine | Admitting: Emergency Medicine

## 2018-09-07 DIAGNOSIS — R410 Disorientation, unspecified: Secondary | ICD-10-CM | POA: Diagnosis not present

## 2018-09-07 DIAGNOSIS — Z8673 Personal history of transient ischemic attack (TIA), and cerebral infarction without residual deficits: Secondary | ICD-10-CM | POA: Insufficient documentation

## 2018-09-07 DIAGNOSIS — I2510 Atherosclerotic heart disease of native coronary artery without angina pectoris: Secondary | ICD-10-CM | POA: Diagnosis not present

## 2018-09-07 DIAGNOSIS — Z853 Personal history of malignant neoplasm of breast: Secondary | ICD-10-CM | POA: Insufficient documentation

## 2018-09-07 DIAGNOSIS — R4182 Altered mental status, unspecified: Secondary | ICD-10-CM | POA: Diagnosis not present

## 2018-09-07 DIAGNOSIS — R442 Other hallucinations: Secondary | ICD-10-CM | POA: Diagnosis not present

## 2018-09-07 DIAGNOSIS — I10 Essential (primary) hypertension: Secondary | ICD-10-CM | POA: Insufficient documentation

## 2018-09-07 DIAGNOSIS — G459 Transient cerebral ischemic attack, unspecified: Secondary | ICD-10-CM | POA: Diagnosis not present

## 2018-09-07 DIAGNOSIS — R42 Dizziness and giddiness: Secondary | ICD-10-CM | POA: Diagnosis not present

## 2018-09-07 DIAGNOSIS — R443 Hallucinations, unspecified: Secondary | ICD-10-CM | POA: Diagnosis not present

## 2018-09-07 DIAGNOSIS — R0902 Hypoxemia: Secondary | ICD-10-CM | POA: Diagnosis not present

## 2018-09-07 LAB — URINALYSIS, ROUTINE W REFLEX MICROSCOPIC
Bilirubin Urine: NEGATIVE
Glucose, UA: NEGATIVE mg/dL
Hgb urine dipstick: NEGATIVE
Ketones, ur: NEGATIVE mg/dL
Leukocytes, UA: NEGATIVE
NITRITE: NEGATIVE
PROTEIN: NEGATIVE mg/dL
Specific Gravity, Urine: 1.013 (ref 1.005–1.030)
pH: 7 (ref 5.0–8.0)

## 2018-09-07 LAB — COMPREHENSIVE METABOLIC PANEL
ALT: 18 U/L (ref 0–44)
AST: 28 U/L (ref 15–41)
Albumin: 3 g/dL — ABNORMAL LOW (ref 3.5–5.0)
Alkaline Phosphatase: 60 U/L (ref 38–126)
Anion gap: 7 (ref 5–15)
BUN: 8 mg/dL (ref 8–23)
CO2: 29 mmol/L (ref 22–32)
Calcium: 9 mg/dL (ref 8.9–10.3)
Chloride: 104 mmol/L (ref 98–111)
Creatinine, Ser: 0.7 mg/dL (ref 0.44–1.00)
GFR calc Af Amer: >60 mL/min (ref >60)
Glucose, Bld: 93 mg/dL (ref 70–99)
POTASSIUM: 4.1 mmol/L (ref 3.5–5.1)
Sodium: 140 mmol/L (ref 135–145)
Total Bilirubin: 0.8 mg/dL (ref 0.3–1.2)
Total Protein: 6.8 g/dL (ref 6.5–8.1)

## 2018-09-07 LAB — CBC
HEMATOCRIT: 40.6 % (ref 36.0–46.0)
Hemoglobin: 12.8 g/dL (ref 12.0–15.0)
MCH: 30.3 pg (ref 26.0–34.0)
MCHC: 31.5 g/dL (ref 30.0–36.0)
MCV: 96 fL (ref 80.0–100.0)
Platelets: 234 10*3/uL (ref 150–400)
RBC: 4.23 MIL/uL (ref 3.87–5.11)
RDW: 12.6 % (ref 11.5–15.5)
WBC: 8.5 10*3/uL (ref 4.0–10.5)
nRBC: 0 % (ref 0.0–0.2)

## 2018-09-07 LAB — I-STAT TROPONIN, ED: Troponin i, poc: 0 ng/mL (ref 0.00–0.08)

## 2018-09-07 MED ORDER — CLOPIDOGREL BISULFATE 75 MG PO TABS: 75.0000 mg | ORAL_TABLET | Freq: Every day | ORAL | 1 refills | Status: AC

## 2018-09-07 NOTE — Patient Outreach (Signed)
[facility]) Care Management  09/07/2018  Leah Figueroa 1922/09/23 [id number]  EMMI: stroke red alert Referral date: 09/04/18 Referral reason:Questions / problems with medications Insurance: [facility] Day # 9 Attempt  #2  Telephone call to patient regarding EMMI stroke red alert. Unable to reach patient. HIPAA compliant voice message left with call back phone number.   PLAN; RNCM will attempt 3rd telephone outreach call.   [name] RN,BSN,CCM [facility] Telephonic  [phone]

## 2018-09-07 NOTE — ED Provider Notes (Signed)
[facility] Emergency Department Provider Note MRN:  [id number]  Arrival date & time: 09/07/18     Chief Complaint   Dizziness and Altered Mental Status   History of Present Illness   Leah Figueroa is a 85 y.o. year-old female with a history of CAD, stroke, dementia presenting to the ED with chief complaint of dizziness, altered mental status.  Per report, patient usually has some increased confusion and hallucinations in the afternoon.  This morning was unusual according to report and family in that patient was exhibiting the symptoms in the morning and it was much more frequent than normal.  Associated with dizziness, unsteadiness with ambulation.  Patient currently denying any symptoms.  No family with patient at this time.  I was unable to obtain an accurate HPI, PMH, or ROS due to the patient's dementia.  Review of Systems  Positive for dizziness, increased confusion.  Patient's Health History    Past Medical History:  Diagnosis Date  . Acute myocardial infarction of other anterior wall, episode of care unspecified   . Arthritis   . Coronary artery disease   . Other and unspecified hyperlipidemia   . Personal history of arthritis   . Personal history of malignant neoplasm of breast    s/p lumpectomy  . Stroke [facility](HCC) 03/2015   "left sided issues"  . Unspecified essential hypertension   . Unspecified hypothyroidism     Past Surgical History:  Procedure Laterality Date  . BACK SURGERY    . CATARACT EXTRACTION     left-2006, right 2011  . CHOLECYSTECTOMY    . intrathoracic goiter resection    . lumpectomy for breast cancer Left 80's   radiation therapy  . right thyroid lobectomy     Hx Goiter    Family History  Problem Relation Age of Onset  . Colon cancer Mother   . Breast cancer Mother   . Coronary artery disease Brother   . Dementia Sister     Social History   Socioeconomic History  . Marital status: Widowed    Spouse name: Not on file  .  Number of children: 1  . Years of education: 1711  . Highest education level: Not on file  Occupational History  . Occupation: retired  Engineer, productionocial Needs  . Financial resource strain: Not on file  . Food insecurity:    Worry: Not on file    Inability: Not on file  . Transportation needs:    Medical: No    Non-medical: Not on file  Tobacco Use  . Smoking status: Never Smoker  . Smokeless tobacco: Never Used  Substance and Sexual Activity  . Alcohol use: No  . Drug use: No  . Sexual activity: Never  Lifestyle  . Physical activity:    Days per week: Not on file    Minutes per session: Not on file  . Stress: Not on file  Relationships  . Social connections:    Talks on phone: Not on file    Gets together: Not on file    Attends religious service: Not on file    Active member of club or organization: Not on file    Attends meetings of clubs or organizations: Not on file    Relationship status: Not on file  . Intimate partner violence:    Fear of current or ex partner: Not on file    Emotionally abused: Not on file    Physically abused: Not on file    Forced  sexual activity: Not on file  Other Topics Concern  . Not on file  Social History Narrative   HSG. Married '48- 57 yrs/widowed. 1 daughter - '59; 1 grandchild. 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  Lives alone. I-ADLs, manages books, pays bills.. End-of-Life Care: no heroic measures; No CPR, no mechanical ventilation.  Caffeine use/day:  None. Does Patient Exercise:  No       03/07/17 lives with daughter, [name]     Physical Exam  Vital Signs and Nursing Notes reviewed Vitals:   09/07/18 1129 09/07/18 1130  BP: (!) 150/87 (!) 151/75  Pulse: 67 67  Resp: 19 18  Temp: 97.8 F (36.6 C)   SpO2: 100% 94%    CONSTITUTIONAL: Well-appearing, NAD NEURO:  Alert and oriented x 3, no focal deficits; normal and symmetric strength and sensation, no facial droop EYES:  eyes equal and reactive, normal extraocular movements, no  nystagmus ENT/NECK:  no LAD, no JVD CARDIO: Regular rate, well-perfused, normal S1 and S2 PULM:  CTAB no wheezing or rhonchi GI/GU:  normal bowel sounds, non-distended, non-tender MSK/SPINE:  No gross deformities, no edema SKIN:  no rash, atraumatic PSYCH:  Appropriate speech and behavior  Diagnostic and Interventional Summary    EKG Interpretation  Date/Time:  Thursday September 07 2018 11:28:37 EST Ventricular Rate:  84 PR Interval:    QRS Duration: 85 QT Interval:  413 QTC Calculation: 489 R Axis:   78 Text Interpretation:  Sinus or ectopic atrial rhythm Sinus pause Low voltage, extremity leads Borderline prolonged QT interval Confirmed by Kennis Carina [phone]) on 09/07/2018 1:38:52 PM      Labs Reviewed  COMPREHENSIVE METABOLIC PANEL - Abnormal; Notable for the following components:      Result Value   Albumin 3.0 (*)    All other components within normal limits  CBC  URINALYSIS, ROUTINE W REFLEX MICROSCOPIC  I-STAT TROPONIN, ED    CT HEAD WO CONTRAST  Final Result    DG Chest Port 1 View  Final Result      Medications - No data to display   Procedures Critical Care  ED Course and Medical Decision Making  I have reviewed the triage vital signs and the nursing notes.  Pertinent labs & imaging results that were available during my care of the patient were reviewed by me and considered in my medical decision making (see below for details).  Dizziness, unsteady gait, increased confusion in this 85 year old female with history of dementia, recent ischemic stroke 1 month ago.  Considering metabolic disarray, UTI, recurrent stroke, hemorrhagic conversion of old stroke.  Patient is currently exhibiting no objective neurological deficits, is currently denying any symptoms.  Vital signs are normal.  Will obtain EKG to evaluate for arrhythmia, labs, CT head, and hopefully gather more information from family.  Discussed with daughter that patient woke up with unsteady gait,  poor vision, no strength asymmetry.  But daughter explains this was similar to her prior stroke presentation.  Discussed symptoms with neurology who feels that this is either TIA or stroke.  However, she has already been worked up in the hospital so this is not required.  MRI would also not change management.  Patient needs to add Plavix to her medical regimen, she is already on aspirin.  Will follow-up in neurology clinic.  Patient and patient's daughter informed of the increased risks of adding Plavix on the aspirin, expressed their understanding, explained the importance of preventing falls, keeping a close look out for black-colored stools.  Still awaiting urinalysis to exclude UTI, likely a good candidate for discharge regardless.  Signed out to Dr. Estell Harpin at shift change.  Elmer Sow. Pilar Plate, MD [facility] Emergency Medicine [facility] [email or web address]  Final Clinical Impressions(s) / ED Diagnoses     ICD-10-CM   1. TIA (transient ischemic attack) G45.9   2. Confusion R41.0 DG Chest Port 1 View    DG Chest Port 1 View    ED Discharge Orders         Ordered    clopidogrel (PLAVIX) 75 MG tablet  Daily     09/07/18 1558             Sabas Sous, MD 09/07/18 [phone]

## 2018-09-07 NOTE — Discharge Instructions (Addendum)
Follow up with your md next week for rechek 

## 2018-09-07 NOTE — ED Triage Notes (Addendum)
Here to [facility] ED via [facility] with increased confusion and dizziness. Per dtr, hallucinates (baseline in afternoon).  More frequent this am and dizzy/unsteady on feet when she woke up. Pt denies this now.  CBG 111 BP 160/80 HR 90s SR 100% RA

## 2018-09-08 DIAGNOSIS — I10 Essential (primary) hypertension: Secondary | ICD-10-CM | POA: Diagnosis not present

## 2018-09-08 DIAGNOSIS — I2510 Atherosclerotic heart disease of native coronary artery without angina pectoris: Secondary | ICD-10-CM | POA: Diagnosis not present

## 2018-09-08 DIAGNOSIS — M1990 Unspecified osteoarthritis, unspecified site: Secondary | ICD-10-CM | POA: Diagnosis not present

## 2018-09-08 DIAGNOSIS — I252 Old myocardial infarction: Secondary | ICD-10-CM | POA: Diagnosis not present

## 2018-09-08 DIAGNOSIS — I69318 Other symptoms and signs involving cognitive functions following cerebral infarction: Secondary | ICD-10-CM | POA: Diagnosis not present

## 2018-09-08 DIAGNOSIS — L89151 Pressure ulcer of sacral region, stage 1: Secondary | ICD-10-CM | POA: Diagnosis not present

## 2018-09-08 DIAGNOSIS — I480 Paroxysmal atrial fibrillation: Secondary | ICD-10-CM | POA: Diagnosis not present

## 2018-09-08 DIAGNOSIS — I69354 Hemiplegia and hemiparesis following cerebral infarction affecting left non-dominant side: Secondary | ICD-10-CM | POA: Diagnosis not present

## 2018-09-08 DIAGNOSIS — S81802D Unspecified open wound, left lower leg, subsequent encounter: Secondary | ICD-10-CM | POA: Diagnosis not present

## 2018-09-12 ENCOUNTER — Other Ambulatory Visit: Payer: Self-pay

## 2018-09-12 DIAGNOSIS — S81802D Unspecified open wound, left lower leg, subsequent encounter: Secondary | ICD-10-CM | POA: Diagnosis not present

## 2018-09-12 DIAGNOSIS — I69354 Hemiplegia and hemiparesis following cerebral infarction affecting left non-dominant side: Secondary | ICD-10-CM | POA: Diagnosis not present

## 2018-09-12 DIAGNOSIS — L89151 Pressure ulcer of sacral region, stage 1: Secondary | ICD-10-CM | POA: Diagnosis not present

## 2018-09-12 DIAGNOSIS — I10 Essential (primary) hypertension: Secondary | ICD-10-CM | POA: Diagnosis not present

## 2018-09-12 DIAGNOSIS — I69318 Other symptoms and signs involving cognitive functions following cerebral infarction: Secondary | ICD-10-CM | POA: Diagnosis not present

## 2018-09-12 DIAGNOSIS — I2510 Atherosclerotic heart disease of native coronary artery without angina pectoris: Secondary | ICD-10-CM | POA: Diagnosis not present

## 2018-09-12 DIAGNOSIS — I252 Old myocardial infarction: Secondary | ICD-10-CM | POA: Diagnosis not present

## 2018-09-12 DIAGNOSIS — M1990 Unspecified osteoarthritis, unspecified site: Secondary | ICD-10-CM | POA: Diagnosis not present

## 2018-09-12 DIAGNOSIS — I480 Paroxysmal atrial fibrillation: Secondary | ICD-10-CM | POA: Diagnosis not present

## 2018-09-12 NOTE — Patient Outreach (Addendum)
[facility]) Care Management  09/12/2018  Leah Figueroa [birth year] [id number]  EMMI:stroke red alert Referral date:09/04/18 Referral reason:Questions / problems with medications Insurance: [employer] Day #9  Telephone call to patient regarding EMMI stroke red alert. Spoke with patients daughter/ designated party release, Leah Figueroa.  HIPAA verified by daughter for patient. Explained reason for call. Daughter states she is not having any problems with patients medication but she does have to crush them to get patient to take. She states she is going to ask patients daughter if any of patients medications come in liquid form. RNCM also advised patient to talk with patients pharmacy regarding liquid options for patients medications. Daughter states patient over the last couple of days has stated she wants to kill herself and has been asking other as well to kill her.   Daughter states she is saying these things, " every 5 minutes." Daughter states patient is not capable of this because she can't do anything for herself..She states patient has someone with her at all times. Daughter states she is with patient during the day and a CNA is with patient in the evening.  She states patient has dementia and requires total assistance for all of her care.  Daughter denies patient taking any antidepressant medication. She states patient takes xanax at night for sleep.  Daughter states patient has never been diagnosed with anxiety / depression. RNCM advised daughter that patients doctor should be notified today,  Provided daughter with Suicide prevention hotline number 437-475-6081, and discussed and offered [facility] care management social work services. Daughter verbally agreed to [facility] care management services. Daughter states she called patients primary MD this morning and is waiting for a call back.  RNCM provided patients daughter name and contact phone number for [facility] care management.  RNCM advised  patient to notify MD of any changes in condition prior to scheduled appointment. RNCM verified patient aware of 911 services for urgent/ emergent needs.  PLAN:  RNCM will refer patient to [facility] social worker  [name] RN,BSN,CCM [facility] Telephonic  [phone]

## 2018-09-13 ENCOUNTER — Other Ambulatory Visit: Payer: Self-pay | Admitting: *Deleted

## 2018-09-13 DIAGNOSIS — I10 Essential (primary) hypertension: Secondary | ICD-10-CM | POA: Diagnosis not present

## 2018-09-13 DIAGNOSIS — I69354 Hemiplegia and hemiparesis following cerebral infarction affecting left non-dominant side: Secondary | ICD-10-CM | POA: Diagnosis not present

## 2018-09-13 DIAGNOSIS — I480 Paroxysmal atrial fibrillation: Secondary | ICD-10-CM | POA: Diagnosis not present

## 2018-09-13 DIAGNOSIS — L89151 Pressure ulcer of sacral region, stage 1: Secondary | ICD-10-CM | POA: Diagnosis not present

## 2018-09-13 DIAGNOSIS — I2510 Atherosclerotic heart disease of native coronary artery without angina pectoris: Secondary | ICD-10-CM | POA: Diagnosis not present

## 2018-09-13 DIAGNOSIS — I69318 Other symptoms and signs involving cognitive functions following cerebral infarction: Secondary | ICD-10-CM | POA: Diagnosis not present

## 2018-09-13 DIAGNOSIS — M1990 Unspecified osteoarthritis, unspecified site: Secondary | ICD-10-CM | POA: Diagnosis not present

## 2018-09-13 DIAGNOSIS — I252 Old myocardial infarction: Secondary | ICD-10-CM | POA: Diagnosis not present

## 2018-09-13 DIAGNOSIS — S81802D Unspecified open wound, left lower leg, subsequent encounter: Secondary | ICD-10-CM | POA: Diagnosis not present

## 2018-09-13 NOTE — Patient Outreach (Signed)
[facility]) Care Management  09/13/2018  Leah Figueroa January 28, 1923 [id number]   Patient referred to this social worker due to frequent expressions of suicide ideation. This social worker spoke to patient's daughter who stated that patient suffers from Advanced Dementia and speaks of suicide regularly. Patient also tends to mistake others for people known in her past.   Per patent's daughter, the doctor has prescribed Sertraline 25mg  in the morning.  Patient's spouse discussed that since patient's last stroke she can do very little for herself. She can feed herself most of the time and walk with her walker with assistance. Patient sleeps for most of the day. Patient's daughter has 2 CNA's that care for patient at night and she provides the care during the day. Per patent's daughter, she feels very comfortable with them, however has become so agitated and unpredictable that many of friends no longer come to visit. Per patient's daughter, patient has no access to weapons, medications to harm herself and she is not left alone. Per patient's daughter, patient states that her plan is to stop eating, however she eats very little generally.This Child psychotherapist provided patient's daughter with emotional support. Reinforced safety and redirecting conversation as much as possible.Home visit offered, however per patient's daughter, patient is very suspicious of  strangers and would like to see how patient reacts to the medication change.   Plan: This Child psychotherapist will discuss options with patient's doctor. This Child psychotherapist will follow up with patient's daughter within 2 weeks to further discuss patient's progress and community resource options.   [name] [facility] Care Management [phone]

## 2018-09-14 DIAGNOSIS — I480 Paroxysmal atrial fibrillation: Secondary | ICD-10-CM | POA: Diagnosis not present

## 2018-09-14 DIAGNOSIS — I2510 Atherosclerotic heart disease of native coronary artery without angina pectoris: Secondary | ICD-10-CM | POA: Diagnosis not present

## 2018-09-14 DIAGNOSIS — I69318 Other symptoms and signs involving cognitive functions following cerebral infarction: Secondary | ICD-10-CM | POA: Diagnosis not present

## 2018-09-14 DIAGNOSIS — L89151 Pressure ulcer of sacral region, stage 1: Secondary | ICD-10-CM | POA: Diagnosis not present

## 2018-09-14 DIAGNOSIS — I252 Old myocardial infarction: Secondary | ICD-10-CM | POA: Diagnosis not present

## 2018-09-14 DIAGNOSIS — I69354 Hemiplegia and hemiparesis following cerebral infarction affecting left non-dominant side: Secondary | ICD-10-CM | POA: Diagnosis not present

## 2018-09-14 DIAGNOSIS — I10 Essential (primary) hypertension: Secondary | ICD-10-CM | POA: Diagnosis not present

## 2018-09-14 DIAGNOSIS — M1990 Unspecified osteoarthritis, unspecified site: Secondary | ICD-10-CM | POA: Diagnosis not present

## 2018-09-14 DIAGNOSIS — S81802D Unspecified open wound, left lower leg, subsequent encounter: Secondary | ICD-10-CM | POA: Diagnosis not present

## 2018-09-15 DIAGNOSIS — I2510 Atherosclerotic heart disease of native coronary artery without angina pectoris: Secondary | ICD-10-CM | POA: Diagnosis not present

## 2018-09-15 DIAGNOSIS — I69318 Other symptoms and signs involving cognitive functions following cerebral infarction: Secondary | ICD-10-CM | POA: Diagnosis not present

## 2018-09-15 DIAGNOSIS — I69354 Hemiplegia and hemiparesis following cerebral infarction affecting left non-dominant side: Secondary | ICD-10-CM | POA: Diagnosis not present

## 2018-09-15 DIAGNOSIS — I252 Old myocardial infarction: Secondary | ICD-10-CM | POA: Diagnosis not present

## 2018-09-15 DIAGNOSIS — L89151 Pressure ulcer of sacral region, stage 1: Secondary | ICD-10-CM | POA: Diagnosis not present

## 2018-09-15 DIAGNOSIS — I480 Paroxysmal atrial fibrillation: Secondary | ICD-10-CM | POA: Diagnosis not present

## 2018-09-15 DIAGNOSIS — I10 Essential (primary) hypertension: Secondary | ICD-10-CM | POA: Diagnosis not present

## 2018-09-15 DIAGNOSIS — S81802D Unspecified open wound, left lower leg, subsequent encounter: Secondary | ICD-10-CM | POA: Diagnosis not present

## 2018-09-15 DIAGNOSIS — M1990 Unspecified osteoarthritis, unspecified site: Secondary | ICD-10-CM | POA: Diagnosis not present

## 2018-09-18 DIAGNOSIS — L89151 Pressure ulcer of sacral region, stage 1: Secondary | ICD-10-CM | POA: Diagnosis not present

## 2018-09-18 DIAGNOSIS — S81802D Unspecified open wound, left lower leg, subsequent encounter: Secondary | ICD-10-CM | POA: Diagnosis not present

## 2018-09-18 DIAGNOSIS — I480 Paroxysmal atrial fibrillation: Secondary | ICD-10-CM | POA: Diagnosis not present

## 2018-09-18 DIAGNOSIS — I2510 Atherosclerotic heart disease of native coronary artery without angina pectoris: Secondary | ICD-10-CM | POA: Diagnosis not present

## 2018-09-18 DIAGNOSIS — I252 Old myocardial infarction: Secondary | ICD-10-CM | POA: Diagnosis not present

## 2018-09-18 DIAGNOSIS — I69354 Hemiplegia and hemiparesis following cerebral infarction affecting left non-dominant side: Secondary | ICD-10-CM | POA: Diagnosis not present

## 2018-09-18 DIAGNOSIS — I10 Essential (primary) hypertension: Secondary | ICD-10-CM | POA: Diagnosis not present

## 2018-09-18 DIAGNOSIS — I69318 Other symptoms and signs involving cognitive functions following cerebral infarction: Secondary | ICD-10-CM | POA: Diagnosis not present

## 2018-09-18 DIAGNOSIS — M1990 Unspecified osteoarthritis, unspecified site: Secondary | ICD-10-CM | POA: Diagnosis not present

## 2018-09-20 DIAGNOSIS — I252 Old myocardial infarction: Secondary | ICD-10-CM | POA: Diagnosis not present

## 2018-09-20 DIAGNOSIS — I10 Essential (primary) hypertension: Secondary | ICD-10-CM | POA: Diagnosis not present

## 2018-09-20 DIAGNOSIS — I69318 Other symptoms and signs involving cognitive functions following cerebral infarction: Secondary | ICD-10-CM | POA: Diagnosis not present

## 2018-09-20 DIAGNOSIS — S81802D Unspecified open wound, left lower leg, subsequent encounter: Secondary | ICD-10-CM | POA: Diagnosis not present

## 2018-09-20 DIAGNOSIS — I2510 Atherosclerotic heart disease of native coronary artery without angina pectoris: Secondary | ICD-10-CM | POA: Diagnosis not present

## 2018-09-20 DIAGNOSIS — L89151 Pressure ulcer of sacral region, stage 1: Secondary | ICD-10-CM | POA: Diagnosis not present

## 2018-09-20 DIAGNOSIS — I69354 Hemiplegia and hemiparesis following cerebral infarction affecting left non-dominant side: Secondary | ICD-10-CM | POA: Diagnosis not present

## 2018-09-20 DIAGNOSIS — M1990 Unspecified osteoarthritis, unspecified site: Secondary | ICD-10-CM | POA: Diagnosis not present

## 2018-09-20 DIAGNOSIS — I480 Paroxysmal atrial fibrillation: Secondary | ICD-10-CM | POA: Diagnosis not present

## 2018-09-21 DIAGNOSIS — I69359 Hemiplegia and hemiparesis following cerebral infarction affecting unspecified side: Secondary | ICD-10-CM | POA: Diagnosis not present

## 2018-09-21 DIAGNOSIS — G459 Transient cerebral ischemic attack, unspecified: Secondary | ICD-10-CM | POA: Diagnosis not present

## 2018-09-21 DIAGNOSIS — R413 Other amnesia: Secondary | ICD-10-CM | POA: Diagnosis not present

## 2018-09-21 DIAGNOSIS — H543 Unqualified visual loss, both eyes: Secondary | ICD-10-CM | POA: Diagnosis not present

## 2018-09-21 DIAGNOSIS — R531 Weakness: Secondary | ICD-10-CM | POA: Diagnosis not present

## 2018-09-26 ENCOUNTER — Other Ambulatory Visit: Payer: Self-pay | Admitting: *Deleted

## 2018-09-26 NOTE — Patient Outreach (Signed)
[facility]) Care Management  09/26/2018  Ilana Fahey Wegener 03/15/1923 [id number]   Phone call to patient's daughter to follow up on patient's progress with the new medicine. Per patient's daughter, her medication has been changed twice now.  Per patient's daughter, she has stopped the Sertraline and is now on another anti-depressant. This Child psychotherapist discussed the possibility of a Palliative Care consult for symptom management. Patient's daughter agrees with this referral.  Plan: This social worker to refer patient to palliative care.    [name] [facility] Care Management [phone]

## 2018-09-27 ENCOUNTER — Other Ambulatory Visit: Payer: Self-pay | Admitting: *Deleted

## 2018-09-27 NOTE — Patient Outreach (Signed)
[facility]) Care Management  09/27/2018  Jeena Manu Othman 09/12/1922 [id number]   Referral to Palliative Care completed. This social worker spoke with Misty Stanley, who reported that she will call patient's provider's office for Palliative Care Order.   [name] [facility] Care Management [phone]

## 2018-09-28 ENCOUNTER — Other Ambulatory Visit: Payer: Medicare HMO | Admitting: Primary Care

## 2018-09-28 DIAGNOSIS — Z515 Encounter for palliative care: Secondary | ICD-10-CM | POA: Diagnosis not present

## 2018-09-28 DIAGNOSIS — Z8673 Personal history of transient ischemic attack (TIA), and cerebral infarction without residual deficits: Secondary | ICD-10-CM

## 2018-09-28 DIAGNOSIS — F0151 Vascular dementia with behavioral disturbance: Secondary | ICD-10-CM

## 2018-09-28 DIAGNOSIS — I480 Paroxysmal atrial fibrillation: Secondary | ICD-10-CM | POA: Diagnosis not present

## 2018-09-28 DIAGNOSIS — S81802D Unspecified open wound, left lower leg, subsequent encounter: Secondary | ICD-10-CM | POA: Diagnosis not present

## 2018-09-28 DIAGNOSIS — I2510 Atherosclerotic heart disease of native coronary artery without angina pectoris: Secondary | ICD-10-CM | POA: Diagnosis not present

## 2018-09-28 DIAGNOSIS — G459 Transient cerebral ischemic attack, unspecified: Secondary | ICD-10-CM

## 2018-09-28 DIAGNOSIS — R269 Unspecified abnormalities of gait and mobility: Secondary | ICD-10-CM

## 2018-09-28 DIAGNOSIS — I69354 Hemiplegia and hemiparesis following cerebral infarction affecting left non-dominant side: Secondary | ICD-10-CM | POA: Diagnosis not present

## 2018-09-28 DIAGNOSIS — F01518 Vascular dementia, unspecified severity, with other behavioral disturbance: Secondary | ICD-10-CM

## 2018-09-28 DIAGNOSIS — I252 Old myocardial infarction: Secondary | ICD-10-CM | POA: Diagnosis not present

## 2018-09-28 DIAGNOSIS — M1990 Unspecified osteoarthritis, unspecified site: Secondary | ICD-10-CM | POA: Diagnosis not present

## 2018-09-28 DIAGNOSIS — I69318 Other symptoms and signs involving cognitive functions following cerebral infarction: Secondary | ICD-10-CM | POA: Diagnosis not present

## 2018-09-28 DIAGNOSIS — R414 Neurologic neglect syndrome: Secondary | ICD-10-CM

## 2018-09-28 DIAGNOSIS — L89151 Pressure ulcer of sacral region, stage 1: Secondary | ICD-10-CM | POA: Diagnosis not present

## 2018-09-28 DIAGNOSIS — I10 Essential (primary) hypertension: Secondary | ICD-10-CM | POA: Diagnosis not present

## 2018-09-28 NOTE — Progress Notes (Signed)
[facility] Consult Note Telephone: [phone]  Fax: [phone]  PATIENT NAME: Leah Figueroa DOB: 1923/01/22 MRN: [id number]  PRIMARY CARE PROVIDER:   Merlene LaughterStoneking, Hal, MD  REFERRING PROVIDER:  Merlene LaughterStoneking, Hal, MD [address] [place], [place] [address]  RESPONSIBLE PARTY:   Extended Emergency Contact Information Primary Emergency Contact: Pegram,[name] Address: [address]          [place], [place] [address] [place] Home Phone: [phone] Relation: Daughter Secondary Emergency Contact: Pegram,[name] Address: [address]          [place], [place] [address] [place] Mobile Phone: [phone] Relation: Relative  [facility] received a consultation request from Dr. Pete GlatterStoneking. This is the initial  assessment.   ASSESSMENT and RECOMMENDATIONS:   1. Dementia: Recommend 1.  assess for UTI. Instructed daughter to make appointment with PCP to assess possible UTI given the precipitous onset and escalation of agitated behaviors. 2.  Recommend d/c buspirone. Some side effects are noted to be insomnia, confusion, nervousness, hostility. These are  symptoms recently reported as increased by the patient daughter. 3. Increase xanax to 0.25 mg bid with option to increase to tid. While prescribed for am, caregiver is giving at hs. Patient is tolerating xanax at hs well, with some awakening at night, but most nights sleeps 8-10 hours. Increasing amount to full therapeutic dosing may modulate daytime agitation.   2. Caregiver Stress:  Recommend for the caregiver to look into short-term respite stays at skilled facilities so she could have a vacation. Encouraged her to begin to speak with facilities about permanent placement and assume a timeline of 4 to 8 weeks, as she states she has considered placement in a facility. Daughter voices immense stress from caregiving  and especially in light of dementia progression.  3. Pain:  Recommend a trial of Acetaminophen Arthritis ATC, 650 mg TID. Patient denies pain but I would like to rule out pain as a etiology for behavior. If no change with addition, this medication could be stopped. Caregiver states patient can verbalize pain but this could still be poorly reported given the progressive nature of dementia.   4. Constipation: Encouraged a daily bowel protocol to achieve a soft formed stool daily. Prunes, fiber, miralax, etc but daily. Takes stool softener intermittently.  5. Safety/elopement risk: Recommend home alarms, now in place. Encourage to use walker at all times. Patient has tried to exit home on occasion,  is not stable ambulating without walker and assistance. She however will try to ambulate without these aids. At risk for falls, has had home PT but last treatment will be next week. Caregiver has placed non-restraining alarms on her bed and person so that caregiver  will be notified if patient tries to get up. They also have alarms on the doors that will alert should  patient try to leave.   6. Nutrition: Consider mirtazapine 7.5-15 mg daily after a trial of increased xanax to see if she may be more accepting of food if not as agitated. Albumin decreasing, down from 4.1 to  3.1.  over 2 months. Poor intake, poor fluid intake. Patient is refusing food, throwing food.   6. Goals of care. DNR, Limited MOST created today and left in home. Discussed advance directives. Daughter states patient has a DNR order currently. Discussed the MOST form and explained the more extensive directions for care therein. Daughter chose to elect continued  do not resuscitate, limited scope of hospitalization to include assessment but no ICU admission or intubation,  use of antibiotics and IV fluids as needed, no feeding tube. Patient has had escalating aggressive behaviors including yelling, obsession with death, oppositional behavior,  outside her normal personality. Her daughter is her caregiver and has been so for three years prior to her strokes, around Christmas 2019.   Palliative care will continue to follow for goals of care clarification and symptom management. Return in 1 to 2 weeks to assess efficacy of any of the above recommended changes.  I spent 75 minutes providing this consultation,  from 1500 to 1615. More than 50% of the time in this consultation was spent coordinating communication.   HISTORY OF PRESENT ILLNESS:  Leah Figueroa is a 85 y.o. year old female with multiple medical problems including OA, MI, HTN, Dementia, CVA. Palliative Care was asked to help address goals of care.   CODE STATUS: Do not resuscitate,MOST  limited scope of hospitalization to include assessment but no ICU admission or intubation,  use of antibiotics and IV fluids as needed, no feeding tube.   PPS: 30% HOSPICE ELIGIBILITY/DIAGNOSIS: TBD  PHYSICAL EXAM:   80 lbs, lost 15 pounds in last year. VS 97.3-69-18  146/71 Pulse ox 96% RA General: NAD, frail appearing, thin Cardiovascular: regular rate and rhythm, S1S2 Pulmonary: clear all fields, no cough Abdomen: soft, nontender, + bowel sounds , difficulty with feeding, pt taking food and throwing, not taking in fluids. GU: no dysuria, continent of urine Extremities: no edema, no joint deformities Skin: no rashes or wounds Neurological: Weakness, CVA 3 years ago and daughter is caregiver, recent additional CVA with Left hemiparesis and ataxia  Marijo File DNP, AGPCNP-BC  PAST MEDICAL HISTORY:  Past Medical History:  Diagnosis Date  . Acute myocardial infarction of other anterior wall, episode of care unspecified   . Arthritis   . Coronary artery disease   . Other and unspecified hyperlipidemia   . Personal history of arthritis   . Personal history of malignant neoplasm of breast    s/p lumpectomy  . Stroke [facility]) 03/2015   "left sided issues"  . Unspecified essential  hypertension   . Unspecified hypothyroidism     SOCIAL HX:  Social History   Tobacco Use  . Smoking status: Never Smoker  . Smokeless tobacco: Never Used  Substance Use Topics  . Alcohol use: No    ALLERGIES: No Known Allergies   PERTINENT MEDICATIONS:  Outpatient Encounter Medications as of 09/28/2018  Medication Sig  . acetaminophen (TYLENOL) 325 MG tablet Take 2 tablets (650 mg total) by mouth every 4 (four) hours as needed for mild pain (or temp > 37.5 C (99.5 F)).  Marland Kitchen ALPRAZolam (XANAX) 0.25 MG tablet Take 1 tablet (0.25 mg total) by mouth every morning.  Marland Kitchen aspirin 325 MG tablet Take 1 tablet (325 mg total) by mouth daily.  Marland Kitchen atorvastatin (LIPITOR) 40 MG tablet Take 1 tablet (40 mg total) by mouth at bedtime.  . clopidogrel (PLAVIX) 75 MG tablet Take 1 tablet (75 mg total) by mouth daily.  . Hydrocortisone (GERHARDT'S BUTT CREAM) CREA Apply 1 application topically 2 (two) times daily.  Marland Kitchen levothyroxine (SYNTHROID, LEVOTHROID) 25 MCG tablet Take 1 tablet (25 mcg total) by mouth daily.  . Melatonin 10 MG TABS Take 10 mg by mouth at bedtime.   . metoprolol tartrate (LOPRESSOR) 50 MG tablet Take 1 tablet (50 mg total) by mouth 2 (two) times daily.  Marland Kitchen  mupirocin ointment (BACTROBAN) 2 % Apply topically daily. Apply to affected area   No facility-administered encounter medications on file as of 09/28/2018.

## 2018-09-29 DIAGNOSIS — L89151 Pressure ulcer of sacral region, stage 1: Secondary | ICD-10-CM | POA: Diagnosis not present

## 2018-09-29 DIAGNOSIS — S81802D Unspecified open wound, left lower leg, subsequent encounter: Secondary | ICD-10-CM | POA: Diagnosis not present

## 2018-09-29 DIAGNOSIS — I252 Old myocardial infarction: Secondary | ICD-10-CM | POA: Diagnosis not present

## 2018-09-29 DIAGNOSIS — I10 Essential (primary) hypertension: Secondary | ICD-10-CM | POA: Diagnosis not present

## 2018-09-29 DIAGNOSIS — I69318 Other symptoms and signs involving cognitive functions following cerebral infarction: Secondary | ICD-10-CM | POA: Diagnosis not present

## 2018-09-29 DIAGNOSIS — I2510 Atherosclerotic heart disease of native coronary artery without angina pectoris: Secondary | ICD-10-CM | POA: Diagnosis not present

## 2018-09-29 DIAGNOSIS — I480 Paroxysmal atrial fibrillation: Secondary | ICD-10-CM | POA: Diagnosis not present

## 2018-09-29 DIAGNOSIS — I69354 Hemiplegia and hemiparesis following cerebral infarction affecting left non-dominant side: Secondary | ICD-10-CM | POA: Diagnosis not present

## 2018-09-29 DIAGNOSIS — M1990 Unspecified osteoarthritis, unspecified site: Secondary | ICD-10-CM | POA: Diagnosis not present

## 2018-10-02 ENCOUNTER — Other Ambulatory Visit: Payer: Self-pay | Admitting: *Deleted

## 2018-10-02 DIAGNOSIS — E7800 Pure hypercholesterolemia, unspecified: Secondary | ICD-10-CM | POA: Diagnosis not present

## 2018-10-02 DIAGNOSIS — F0151 Vascular dementia with behavioral disturbance: Secondary | ICD-10-CM | POA: Diagnosis not present

## 2018-10-02 DIAGNOSIS — E441 Mild protein-calorie malnutrition: Secondary | ICD-10-CM | POA: Diagnosis not present

## 2018-10-02 DIAGNOSIS — F5101 Primary insomnia: Secondary | ICD-10-CM | POA: Diagnosis not present

## 2018-10-02 DIAGNOSIS — I10 Essential (primary) hypertension: Secondary | ICD-10-CM | POA: Diagnosis not present

## 2018-10-02 DIAGNOSIS — Z79899 Other long term (current) drug therapy: Secondary | ICD-10-CM | POA: Diagnosis not present

## 2018-10-02 DIAGNOSIS — R350 Frequency of micturition: Secondary | ICD-10-CM | POA: Diagnosis not present

## 2018-10-02 NOTE — Patient Outreach (Signed)
[facility]) Care Management  10/02/2018  Leah Figueroa 11-19-1922 [id number]   Per Epic review, patient now established with [facility] Community-Palliative Care.   Patient to be closed to [facility] Care management at this time due to her enrollment in an external program.    [name] [facility] Care Management [phone]

## 2018-10-03 ENCOUNTER — Encounter: Payer: Medicare HMO | Attending: Registered Nurse

## 2018-10-03 DIAGNOSIS — I69354 Hemiplegia and hemiparesis following cerebral infarction affecting left non-dominant side: Secondary | ICD-10-CM | POA: Insufficient documentation

## 2018-10-03 DIAGNOSIS — E038 Other specified hypothyroidism: Secondary | ICD-10-CM | POA: Insufficient documentation

## 2018-10-03 DIAGNOSIS — I69319 Unspecified symptoms and signs involving cognitive functions following cerebral infarction: Secondary | ICD-10-CM | POA: Insufficient documentation

## 2018-10-03 DIAGNOSIS — E785 Hyperlipidemia, unspecified: Secondary | ICD-10-CM | POA: Insufficient documentation

## 2018-10-03 DIAGNOSIS — R414 Neurologic neglect syndrome: Secondary | ICD-10-CM | POA: Insufficient documentation

## 2018-10-03 DIAGNOSIS — I10 Essential (primary) hypertension: Secondary | ICD-10-CM | POA: Insufficient documentation

## 2018-10-03 DIAGNOSIS — Z8673 Personal history of transient ischemic attack (TIA), and cerebral infarction without residual deficits: Secondary | ICD-10-CM | POA: Insufficient documentation

## 2018-10-09 ENCOUNTER — Ambulatory Visit: Payer: Medicare HMO | Admitting: Physical Medicine & Rehabilitation

## 2018-10-10 ENCOUNTER — Inpatient Hospital Stay: Payer: PRIVATE HEALTH INSURANCE | Admitting: Neurology

## 2018-12-15 DEATH — deceased

## 2019-03-07 ENCOUNTER — Encounter: Payer: Self-pay | Admitting: Geriatric Medicine

## 2019-07-06 IMAGING — CT CT HEAD W/O CM
3 series · 16 of 47 positions shown, 19 images · non-contrast
Comparison: 08/11/2018

CLINICAL DATA: Dizziness and confusion

EXAM:
CT HEAD WITHOUT CONTRAST
TECHNIQUE: Contiguous axial images were obtained from the base of the skull
through the vertex without intravenous contrast.

[Series 3: head 5.0 h30s · axial · 0.39mm/px · z∈[-145,-15]mm · 10 of 32 slices shown, 13 images]
[im 3/32  brain]
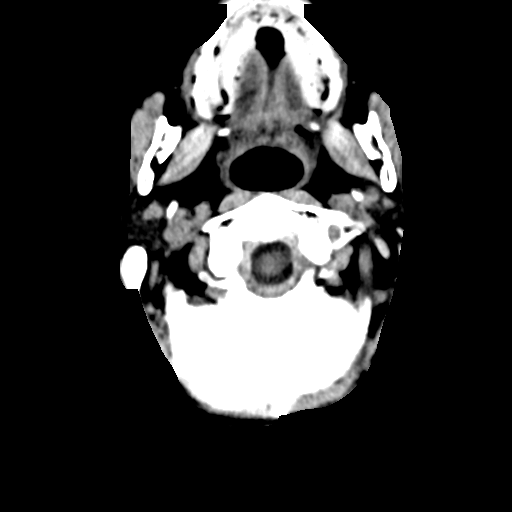
[im 3/32  bone]
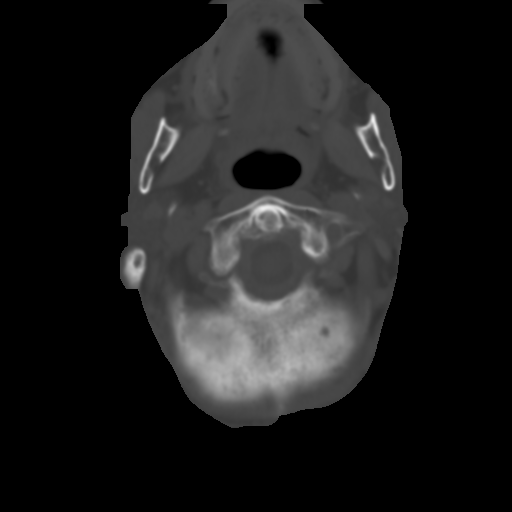
[im 6/32  brain]
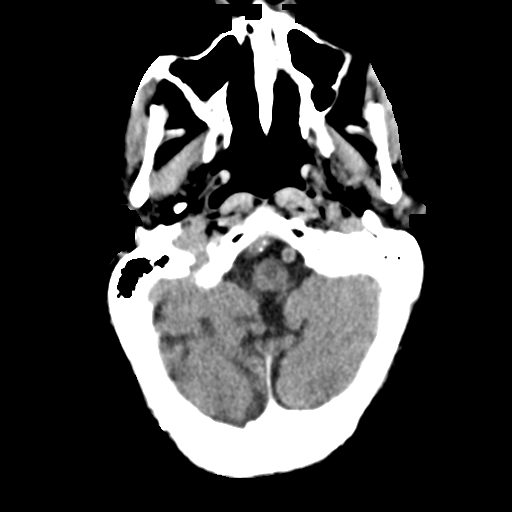
[im 9/32  brain]
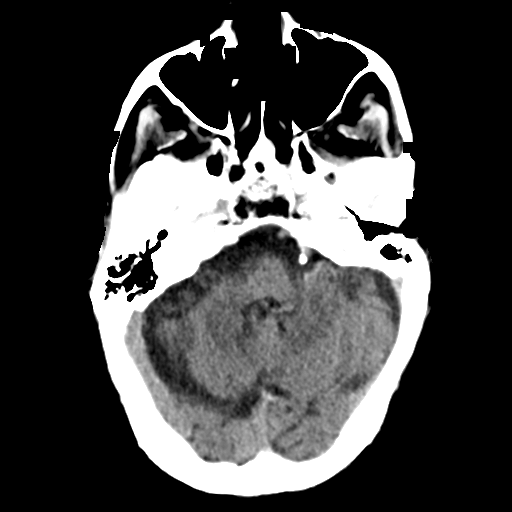
[im 11/32  brain]
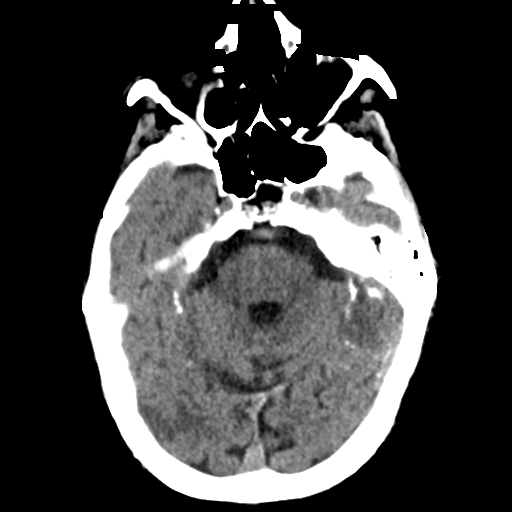
[im 14/32  brain]
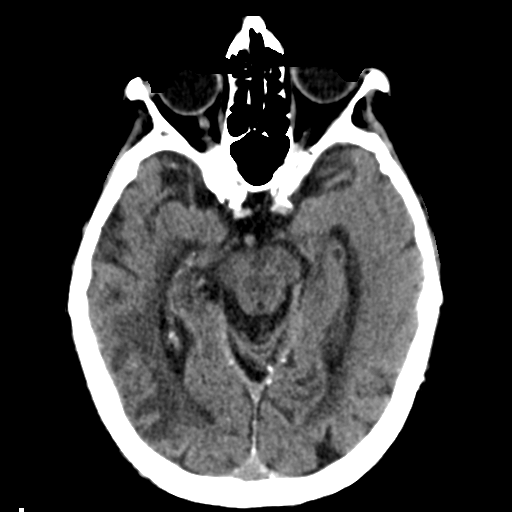
[im 14/32  bone]
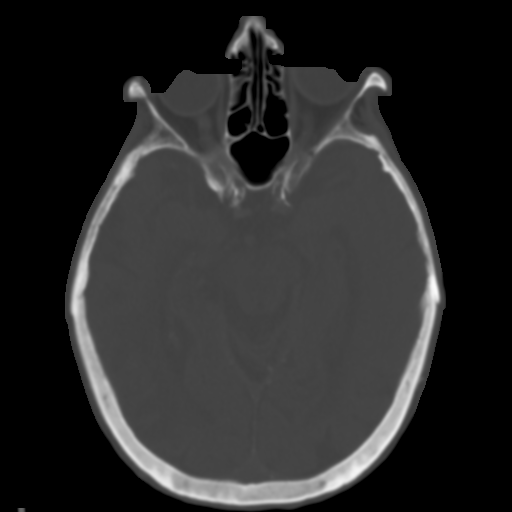
[im 18/32  brain]
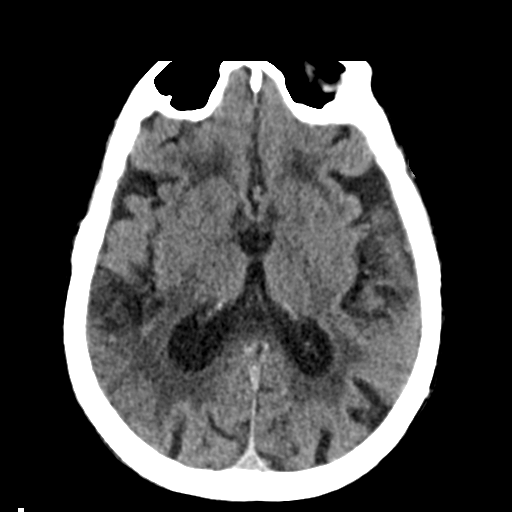
[im 21/32  brain]
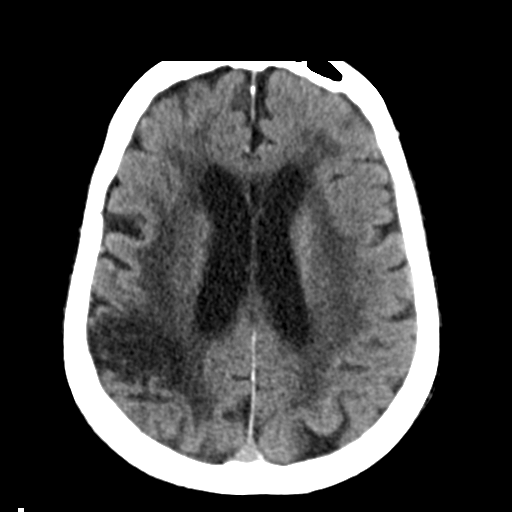
[im 24/32  brain]
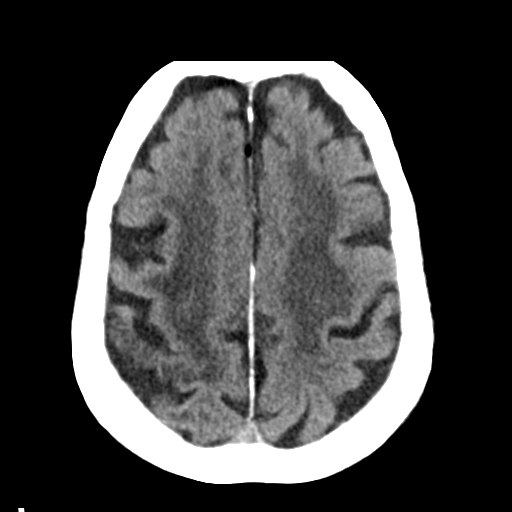
[im 26/32  brain]
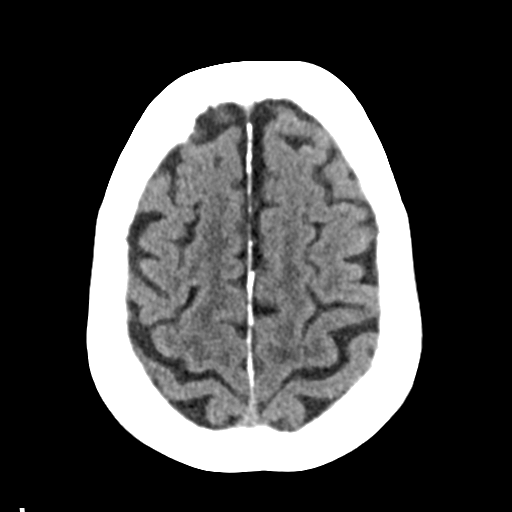
[im 26/32  bone]
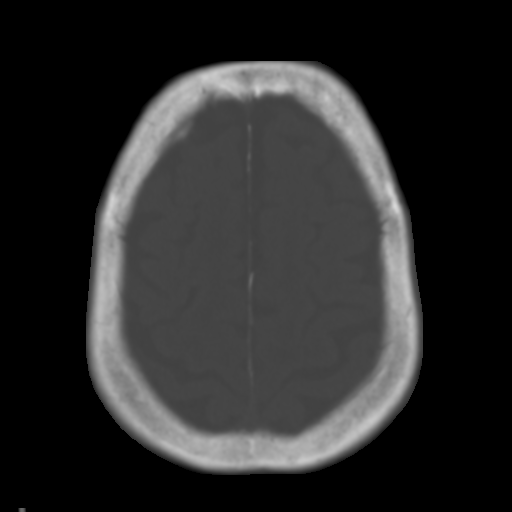
[im 29/32  brain]
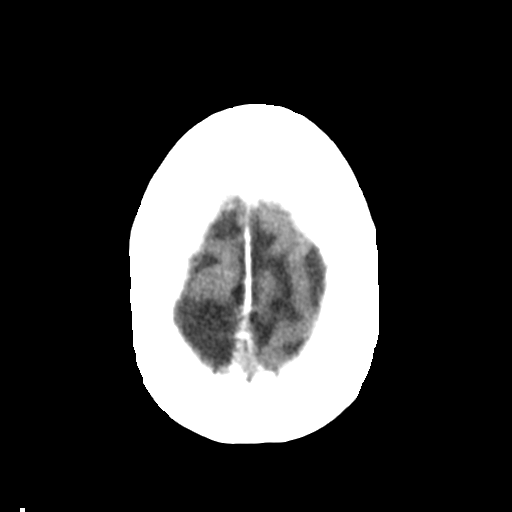

[Series 5: head 3.0 mpr cor · coronal · 0.30mm/px · 3 of 67 slices shown]
[im 23/67  brain]
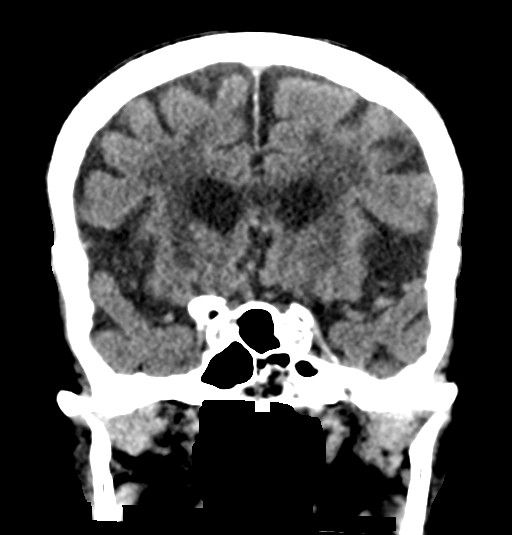
[im 30/67  brain]
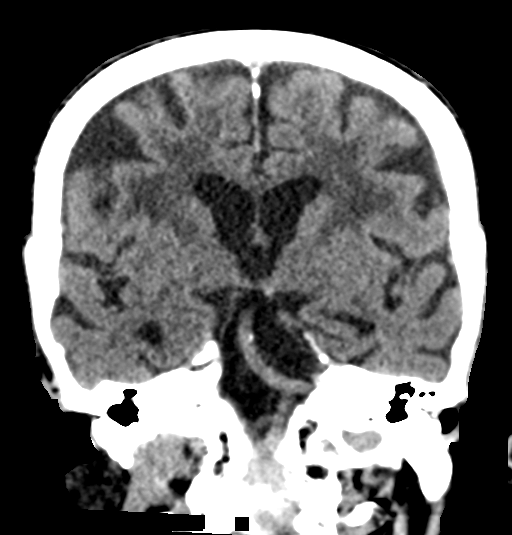
[im 37/67  brain]
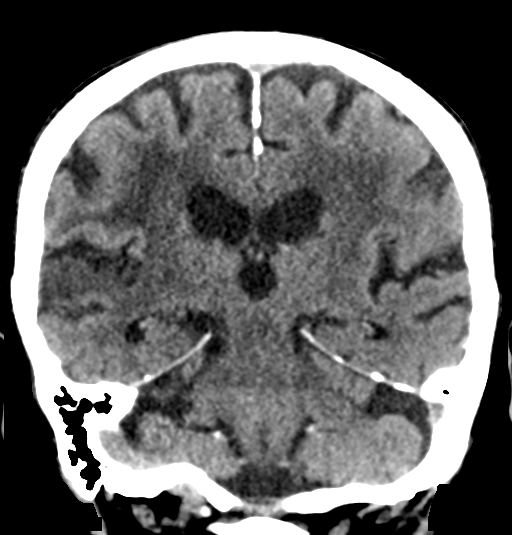

[Series 6: head 3.0 mpr sag · sagittal · 0.32mm/px · 3 of 50 slices shown]
[im 17/50  brain]
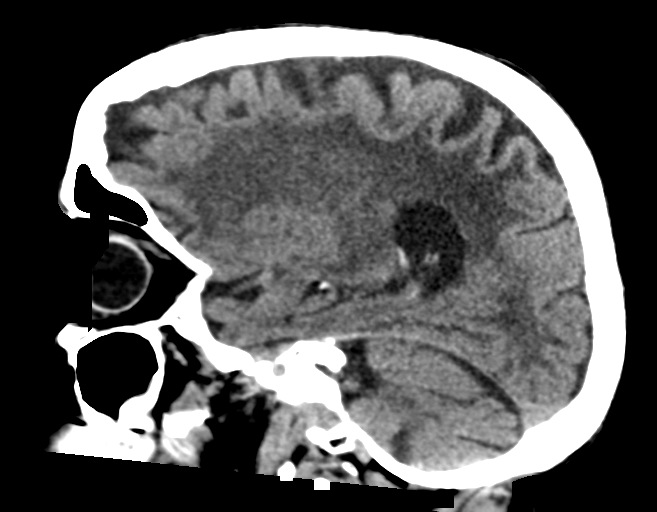
[im 25/50  brain]
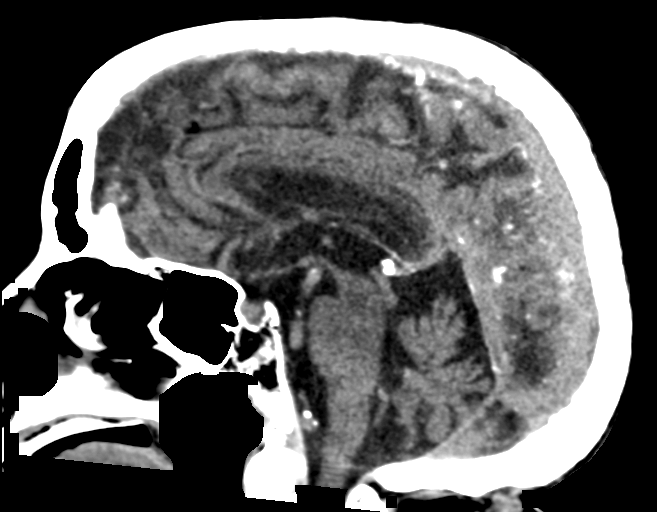
[im 33/50  brain]
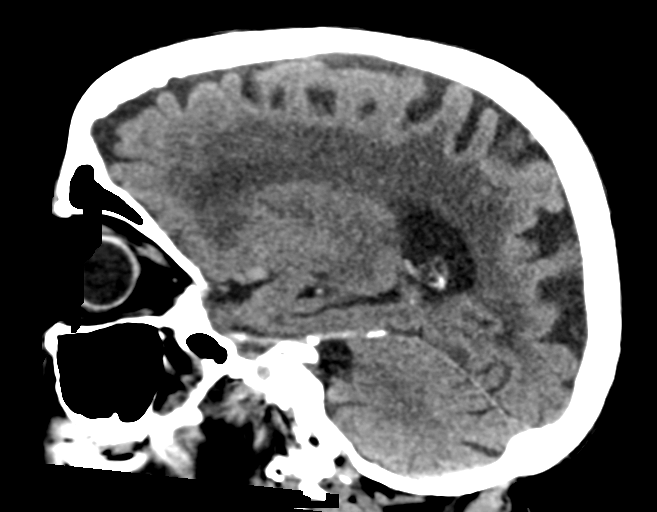

[16 of 47 positions shown; findings below may reference images not displayed]

FINDINGS: Brain: Atrophic changes are noted. Mild encephalomalacia changes are
noted in the distribution of the posterior MCA branches on the right
consistent with that seen on the prior exam. Chronic white matter
ischemic changes are noted as well. No findings to suggest acute
hemorrhage, acute infarction or space-occupying mass lesion are
seen.

Vascular: No hyperdense vessel or unexpected calcification.

Skull: Normal. Negative for fracture or focal lesion.

Sinuses/Orbits: No acute finding.

Other: None.
IMPRESSION: Changes consistent with the known prior right posterior MCA infarct.

Chronic atrophic and ischemic changes without acute abnormality.
